# Patient Record
Sex: Male | Born: 1955
Health system: Southern US, Community
[De-identification: ages and names within clinical notes are randomized; demographics above are authoritative.]

## PROBLEM LIST (undated history)

## (undated) DIAGNOSIS — E349 Endocrine disorder, unspecified: Secondary | ICD-10-CM

## (undated) DIAGNOSIS — I10 Essential (primary) hypertension: Secondary | ICD-10-CM

## (undated) DIAGNOSIS — R079 Chest pain, unspecified: Secondary | ICD-10-CM

## (undated) DIAGNOSIS — I2510 Atherosclerotic heart disease of native coronary artery without angina pectoris: Secondary | ICD-10-CM

## (undated) DIAGNOSIS — C220 Liver cell carcinoma: Secondary | ICD-10-CM

## (undated) DIAGNOSIS — E785 Hyperlipidemia, unspecified: Secondary | ICD-10-CM

## (undated) DIAGNOSIS — D490 Neoplasm of unspecified behavior of digestive system: Secondary | ICD-10-CM

## (undated) HISTORY — DX: Endocrine disorder, unspecified: E34.9

## (undated) HISTORY — DX: Essential (primary) hypertension: I10

## (undated) HISTORY — DX: Hyperlipidemia, unspecified: E78.5

## (undated) HISTORY — PX: NECK SURGERY: SHX720

---

## 1998-01-17 ENCOUNTER — Encounter: Payer: Self-pay | Admitting: Emergency Medicine

## 1998-01-17 ENCOUNTER — Encounter: Payer: Self-pay | Admitting: Internal Medicine

## 1998-01-17 ENCOUNTER — Inpatient Hospital Stay (HOSPITAL_COMMUNITY): Admission: EM | Admit: 1998-01-17 | Discharge: 1998-01-23 | Payer: Self-pay | Admitting: Emergency Medicine

## 1998-01-18 ENCOUNTER — Encounter: Payer: Self-pay | Admitting: Internal Medicine

## 1998-01-19 ENCOUNTER — Encounter: Payer: Self-pay | Admitting: Emergency Medicine

## 1998-01-20 ENCOUNTER — Encounter: Payer: Self-pay | Admitting: Gastroenterology

## 1998-02-12 ENCOUNTER — Encounter: Payer: Self-pay | Admitting: Internal Medicine

## 1998-02-12 ENCOUNTER — Ambulatory Visit (HOSPITAL_COMMUNITY): Admission: RE | Admit: 1998-02-12 | Discharge: 1998-02-12 | Payer: Self-pay | Admitting: Internal Medicine

## 2000-08-16 ENCOUNTER — Ambulatory Visit (HOSPITAL_COMMUNITY): Admission: RE | Admit: 2000-08-16 | Discharge: 2000-08-16 | Payer: Self-pay | Admitting: Family Medicine

## 2000-08-16 ENCOUNTER — Encounter: Payer: Self-pay | Admitting: Family Medicine

## 2000-08-23 ENCOUNTER — Encounter: Payer: Self-pay | Admitting: Family Medicine

## 2000-08-23 ENCOUNTER — Encounter: Admission: RE | Admit: 2000-08-23 | Discharge: 2000-08-23 | Payer: Self-pay | Admitting: Family Medicine

## 2000-10-14 ENCOUNTER — Encounter: Payer: Self-pay | Admitting: Neurosurgery

## 2000-10-18 ENCOUNTER — Ambulatory Visit (HOSPITAL_COMMUNITY): Admission: RE | Admit: 2000-10-18 | Discharge: 2000-10-19 | Payer: Self-pay | Admitting: Neurosurgery

## 2000-10-18 ENCOUNTER — Encounter: Payer: Self-pay | Admitting: Neurosurgery

## 2000-11-19 ENCOUNTER — Ambulatory Visit (HOSPITAL_COMMUNITY): Admission: RE | Admit: 2000-11-19 | Discharge: 2000-11-19 | Payer: Self-pay | Admitting: Neurosurgery

## 2000-11-19 ENCOUNTER — Encounter: Payer: Self-pay | Admitting: Neurosurgery

## 2001-01-10 ENCOUNTER — Ambulatory Visit (HOSPITAL_COMMUNITY): Admission: RE | Admit: 2001-01-10 | Discharge: 2001-01-10 | Payer: Self-pay | Admitting: Neurosurgery

## 2001-01-10 ENCOUNTER — Encounter: Payer: Self-pay | Admitting: Neurosurgery

## 2003-05-03 ENCOUNTER — Encounter: Admission: RE | Admit: 2003-05-03 | Discharge: 2003-05-03 | Payer: Self-pay | Admitting: Neurosurgery

## 2003-05-15 ENCOUNTER — Encounter: Admission: RE | Admit: 2003-05-15 | Discharge: 2003-05-15 | Payer: Self-pay | Admitting: Neurosurgery

## 2003-08-03 ENCOUNTER — Other Ambulatory Visit: Admission: RE | Admit: 2003-08-03 | Discharge: 2003-08-03 | Payer: Self-pay | Admitting: Otolaryngology

## 2003-09-18 ENCOUNTER — Ambulatory Visit (HOSPITAL_COMMUNITY): Admission: RE | Admit: 2003-09-18 | Discharge: 2003-09-18 | Payer: Self-pay | Admitting: Family Medicine

## 2004-01-18 ENCOUNTER — Emergency Department (HOSPITAL_COMMUNITY): Admission: EM | Admit: 2004-01-18 | Discharge: 2004-01-18 | Payer: Self-pay | Admitting: Emergency Medicine

## 2004-02-01 ENCOUNTER — Encounter: Admission: RE | Admit: 2004-02-01 | Discharge: 2004-02-01 | Payer: Self-pay | Admitting: Neurosurgery

## 2004-02-19 ENCOUNTER — Encounter (HOSPITAL_COMMUNITY): Admission: RE | Admit: 2004-02-19 | Discharge: 2004-03-20 | Payer: Self-pay | Admitting: Neurosurgery

## 2004-07-31 ENCOUNTER — Encounter: Admission: RE | Admit: 2004-07-31 | Discharge: 2004-07-31 | Payer: Self-pay | Admitting: Orthopedic Surgery

## 2005-04-24 ENCOUNTER — Ambulatory Visit (HOSPITAL_COMMUNITY): Admission: RE | Admit: 2005-04-24 | Discharge: 2005-04-24 | Payer: Self-pay | Admitting: Neurosurgery

## 2005-06-01 ENCOUNTER — Ambulatory Visit (HOSPITAL_COMMUNITY): Admission: RE | Admit: 2005-06-01 | Discharge: 2005-06-01 | Payer: Self-pay | Admitting: Neurosurgery

## 2006-04-14 ENCOUNTER — Ambulatory Visit: Payer: Self-pay | Admitting: Cardiovascular Disease

## 2006-05-04 ENCOUNTER — Ambulatory Visit: Payer: Self-pay | Admitting: Cardiovascular Disease

## 2007-02-03 DIAGNOSIS — E349 Endocrine disorder, unspecified: Secondary | ICD-10-CM

## 2007-02-03 HISTORY — DX: Endocrine disorder, unspecified: E34.9

## 2008-08-03 ENCOUNTER — Ambulatory Visit (HOSPITAL_COMMUNITY): Admission: RE | Admit: 2008-08-03 | Discharge: 2008-08-03 | Payer: Self-pay | Admitting: Family Medicine

## 2010-06-20 NOTE — Assessment & Plan Note (Signed)
[facility]                       [place] CARDIOLOGY OFFICE NOTE   JULIOUS, LANGLOIS                       MRN:          [id number]  DATE:04/14/2006                            DOB:          04-Sep-1955    [name] is a pleasant 55 year old patient referred for chest pain.  Patient is a previous alcoholic who has been dry for 15 years.  He is a  nonsmoker.  There is a family history for coronary disease.  He has been  ill over the last month.  He has been having increasing night sweats.  He has been having atypical chest pain.  The pain can wake him up from  his sleep.  It is not always exertional.  It is sharp.  It is on the  left side of his chest.   Initially, there was a question of GERD.  He was placed on AcipHex  b.i.d. with improvement.  He was also told to avoid antiinflammatories.   There was a question of whether these nighttime spells were panic  attacks.   The patient has had less chest pain over the last week.  He has not had  any prolonged episodes.   REVIEW OF SYSTEMS:  Remarkable for no significant syncope.  No history  of PE or DVT.  He has apparently had a history of sepsis a few years ago  and had an EGD and colonoscopy.  He tells me at the time his gallbladder  was fine, and no one ever quite figured out how he got septicemic.  Unfortunately, I do not have these records.   His only medication includes Zantac and a baby aspirin a day.   HE HAS NO KNOWN ALLERGIES.   He quit drinking 15 years ago.  He does not smoke, but his wife does.  He works for an [employer] in [place].  He has 3 teenage  kids, and is quite busy taking them to softball and other sporting  events.   He is fairly sedentary.  His kids are trying to get him to join the Y.   PAST MEDICAL HISTORY:  Otherwise remarkable for cervical spine surgery  by Dr. Donalee Citrin, bilateral carpal tunnel release by Dr. Wynetta Emery.   His weight is 247.  His blood  pressure is 130/80.  Pulse 76 and regular.  HEENT:  Is normal.  Carotids normal without bruits.  Status post C spine surgery.  LUNGS:  Clear.  He has an S1 and S2 with normal heart sounds.  ABDOMEN:  Benign.  There is no right upper quadrant pain.  No epigastric  pain.  Distal pulses are intact with no edema.  EKG is entirely normal.   IMPRESSION:  Patient pain is atypical for heart pain, however, he does  have a family history of coronary disease.  He has a normal EKG.  I will  have him come back as soon as possible for routine treadmill test.  As  long as this is normal, he will not need further cardiac workup.  He has  a normal EKG and no murmur.  I do not  think an echo is in order.   If he has recurrent bouts of problems, it would probably be reasonable  to do a right upper quadrant ultrasound and/or an abdominal CT.  He has  a history of occult sepsis that may have been related to  microperfusions, and some of his pain is associated with nausea.   He is also a previous alcoholic, and may need followup EGD.   I will see him back when he has his treadmill test.  His risk factors  otherwise appear normal.  He will continue his H2 blockers.   Further recommendations will be based on the results of stress test.     Theron Arista C. Eden Emms, MD, [facility]  Electronically Signed    PCN/MedQ  DD: 04/14/2006  DT: 04/16/2006  Job #: [id number]   cc:   Lorin Picket A. Gerda Diss, MD

## 2010-06-20 NOTE — Op Note (Signed)
NAMETHUAN, TIPPETT                ACCOUNT NO.:  [id number]   MEDICAL RECORD NO.:  [id number]          PATIENT TYPE:  AMB   LOCATION:  SDS                          FACILITY:  [facility]   PHYSICIAN:  Donalee Citrin, M.D.        DATE OF BIRTH:  Jul 02, 1955   DATE OF PROCEDURE:  06/01/2005  DATE OF DISCHARGE:                                 OPERATIVE REPORT   PREOPERATIVE DIAGNOSIS:  Left carpal tunnel syndrome.   POSTOPERATIVE DIAGNOSIS:  Left carpal tunnel syndrome.   OPERATION PERFORMED:  Left carpal tunnel release.   SURGEON:  Donalee Citrin, M.D.   ANESTHESIA:  Bier block.   INDICATIONS FOR PROCEDURE:  The patient is a very pleasant 55 year old  gentleman who has had bilateral carpal tunnel syndrome.  Had the right one  repaired about six weeks ago, did very well.  EMG documentation of bilateral  severe carpal tunnel syndrome.  Risks and benefits of a left carpal tunnel  release were subsequently explained to the patient and he wanted to proceed  forward and we subsequently set this up.   DESCRIPTION OF PROCEDURE:  The patient was brought to the operating room  where he was induced under Bier block anesthesia, left arm prepped and  draped in the usual sterile fashion.  After adequate anesthesia was  confirmed, an incision was made approximately 3 to 4 cm long from the distal  crease of the wrist along the palmar crease and aligned with the proximal  aspect of the second digit.  Then self-retaining retractor was placed.  Using a combination of sharp dissection and hemostats, the transverse carpal  ligament was identified and divided, was noted to be markedly thickened,  then using a pair of mosquitoes, the undersurface of the transverse carpal  ligament was developed from the __________ median nerve and the epineurial  surface was dissected free and the transverse carpal ligament was divided  both in cephalocaudal direction until the medial nerve was completely  decompressed with no further  stenosis appreciated along the ligament by  easily passing a mosquito hemostat both proximally and distally on the  surface of the nerve root.  The wound was then copiously irrigated.  Meticulous hemostasis was maintained with some adjunctive bipolar  electrocautery.  Then the skin was reapproximated with vertical interrupted  mattress suture.  Bacitracin, Adaptic, Kerlix fluffs, Kerlix roll and then  an Ace wrap were then applied and the tourniquet was released.  Total  tourniquet time was approximately 30 minutes.  The patient was then  transferred to the recovery room in stable condition. At the end of the case  all needle counts, sharp and sponge counts correct.           ______________________________  Donalee Citrin, M.D.     GC/MEDQ  D:  06/01/2005  T:  06/02/2005  Job:  [id number]

## 2010-06-20 NOTE — Procedures (Signed)
[facility]                              EXERCISE TREADMILL   NAME:Logan Alvarez, Logan Alvarez                       MRN:          [id number]  DATE:05/04/2006                            DOB:          1955-12-03    INDICATIONS:  Chest pain, coronary risk factors.   The patient exercised the equivalent of 30 seconds in the stage 4 of the  Bruce protocol. Exercise was stopped due to fatigue and shortness of  breath. Maximum heart rate was 184, peak blood pressure was 208/88.  Resting EKG was normal with stressors, a millimeter of upsloping ST  segment changes in the inferolateral leads. This was not thought to be  significant.   IMPRESSION:  Negative exercise stress test showed a good work load.   The patient should be followed for incipient hypertension given his high  blood pressure response to exercise. Resting blood pressure was normal  at 130/80.     Noralyn Pick. Eden Emms, MD, [facility]  Electronically Signed    PCN/MedQ  DD: 05/04/2006  DT: 05/04/2006  Job #: [id number]   cc:   Lorin Picket A. Gerda Diss, MD
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
## 2010-06-20 NOTE — Op Note (Signed)
[facility]  Patient:    Logan Alvarez, Logan Alvarez Visit Number: [id number] MRN: [id number]          Service Type: DSU Location: 3000 [id number] Attending Physician:  Mariam Dollar Dictated by:   Garlon Hatchet., M.D. Proc. Date: 10/18/00 Admit Date:  10/18/2000                             Operative Report  PREOPERATIVE DIAGNOSIS:  C6-7 radiculopathy from cervical spondylosis.  C5-6.  PROCEDURE:  Anterior cervical diskectomy and fusion at C5-6 and C6-7 using a 6 mm patellar wedge at C5-6 and a 7 mm patellar wedge at C6-7, a 42.5 mm Atlantis vision plate with five 13 mm varied angle screws and one 13 mm rescue screw.  SURGEON:  Garlon Hatchet., M.D.  ASSISTANT:  Altamease Oiler, M.D.  ANESTHESIA:  General endotracheal.  INDICATIONS FOR PROCEDURE:  The patient is a very pleasant 55 year old gentleman who has had long-standing neck and left arm pain radiating down his first two fingers, as well as his middle finger.  He has had weakness of his biceps and triceps that has been refractory to conservative treatment.  The patients preoperative imaging revealed severe cervical spondylosis and spinal cord compression at C5-6 and C6-7 with compression at both C6 and C7 nerve roots on the left.  The patient was counseled on the risks and benefits of surgery and consented.  DESCRIPTION OF PROCEDURE:  The patient was brought to the OR, induced under general anesthesia, positioned supine with the head in full extension, shoulder roll.  The right side of his neck was prepped and draped in usual sterile fashion.  The Halter traction at 5 pounds was also applied. Preoperative x-ray localized the needle at the C5 vertebral body.  A curvilinear incision was made just inferior to this, just off the midline to the sternocleidomastoid.  Superficially, the platysma was dissected out and divided longitudinally.  The area of ______ ______ was developed on the previously old fascia  which was dissected away with Kitnas.  The longus ______ was then identified and reflected laterally.  Intraoperative x-ray confirmed localization of the C4-5 interspace.  Attention was taken to the two disk spaces inferior to this.  Both interspaces were incised with an 11 blade scalpel, and pituitary rongeurs were used to move the anterior margin of the ______.  The remainder of the longus was deflated laterally and a self-retaining retractor was placed.  Then attention was turned to the 5-6 disk space.  Using a BA upgoing curette, the remainder of the anterior margin of the annulus was removed.  Kerrison punches 3 mm removed the anterior osteophyte.  A high speed drill was used to drill out the posterior annulus, and the ligamentous was identified and removed in a piecemeal fashion.  Then the thecal sac was visualized.  The remainder of the ligament was removed. Thecal sac was decompressed, and there was noted to be a large spur and disk material compressing the left side of the spinal cord and left C6 nerve root, and this was also radically decompressed out of the foramen and explored, and confirmed to have no further compression.  Attention was also taken of the right C6 nerve root.  This was also explored across the foramen, and noted to have no further compressive mass on it.  Gelfoam was overlaid.  Attention was taken to the C6-7 disk space.  Again, a  high speed drill was used to drill down the anterior margin of the annulus down to the posterior osteophyte and posterior ligament.  There was a large osteophyte coming off the C6 vertebral body, compressing the proximal aspect of the left C7 nerve root.  This was underbitten with a 1 to 2 mm Kerrison punch, and thecal sac and posterior longitudinal ligament were removed in a piecemeal fashion, and the C7 nerve root and levels were adequately decompressed.  The thecal sac was then decompressed out to the left side, and the right side,  and the proximal aspect of the right C7 nerve root was also decompressed.  After both end plates were prepared for arthrodesis, a 6 and 7 mm patellar wedge was sized and selected, and inserted in the C5-6 and C6-7, respectively.  After 2 mm of depth was removed from the 16 mm wedge creating a 6 x 15 x 12 bone wedge.  Then a 42 mm Atlantis plate was sized and selected and inserted after being drilled and tapped, and as I said, five 13 mm variable angle screws and one rescue screw were inserted because the inferior screw on the patients left side at C7 was noted to be striped, but all screws received good purchase, including the rescue screw on replacement.  The wound was copiously irrigated.  Hemostasis was obtained.  Postoperative x-ray confirmed good location of the plate screws and bone grafts.  The platysmus was closed with 3-0 interrupted Vicryl.  The skin was closed with running 4-0 subcuticular.  Benzoin and Steri-Strips were applied.  The patient went to recovery room in stable condition. Dictated by:   Garlon Hatchet., M.D. Attending Physician:  Mariam Dollar DD:  10/18/00 TD:  10/18/00 Job: [id number]

## 2011-04-14 ENCOUNTER — Ambulatory Visit (HOSPITAL_COMMUNITY)
Admission: RE | Admit: 2011-04-14 | Discharge: 2011-04-14 | Disposition: A | Discharge status: Home / Self Care | Payer: BC Managed Care – PPO | Source: Ambulatory Visit | Attending: Family Medicine | Admitting: Family Medicine

## 2011-04-14 ENCOUNTER — Encounter: Payer: Self-pay | Admitting: Cardiovascular Disease

## 2011-04-14 ENCOUNTER — Other Ambulatory Visit: Payer: Self-pay | Admitting: Family Medicine

## 2011-04-14 DIAGNOSIS — R079 Chest pain, unspecified: Secondary | ICD-10-CM | POA: Insufficient documentation

## 2011-04-15 ENCOUNTER — Ambulatory Visit (INDEPENDENT_AMBULATORY_CARE_PROVIDER_SITE_OTHER): Payer: BC Managed Care – PPO | Admitting: Cardiovascular Disease

## 2011-04-15 ENCOUNTER — Encounter: Payer: Self-pay | Admitting: Cardiovascular Disease

## 2011-04-15 DIAGNOSIS — F419 Anxiety disorder, unspecified: Secondary | ICD-10-CM

## 2011-04-15 DIAGNOSIS — F411 Generalized anxiety disorder: Secondary | ICD-10-CM

## 2011-04-15 DIAGNOSIS — I10 Essential (primary) hypertension: Secondary | ICD-10-CM

## 2011-04-15 DIAGNOSIS — R079 Chest pain, unspecified: Secondary | ICD-10-CM | POA: Insufficient documentation

## 2011-04-15 DIAGNOSIS — R0789 Other chest pain: Secondary | ICD-10-CM | POA: Insufficient documentation

## 2011-04-15 DIAGNOSIS — E782 Mixed hyperlipidemia: Secondary | ICD-10-CM

## 2011-04-15 NOTE — Patient Instructions (Signed)
**Note De-identified Logan Alvarez Obfuscation** Your physician has requested that you have a stress echocardiogram. For further information please visit [email or web address]. Please follow instruction sheet as given.  Your physician recommends that you continue on your current medications as directed. Please refer to the Current Medication list given to you today.  Your physician recommends that you schedule a follow-up appointment in: we will contact you with results of test  

## 2011-04-15 NOTE — Assessment & Plan Note (Signed)
Well controlled.  Continue current medications and low sodium Dash type diet.    

## 2011-04-15 NOTE — Progress Notes (Signed)
Referred by Dr Gerda Diss for SSCP.  Last seen in 2008 for atypical pain.  Had normal ETT at that time.  Recurrent symptoms 04/14/11.  Intermittent, sharp pains can radiate to both arms.  More stress lately and ? History of panic attacks. Positive family history of CAD Father MI in his 73's.  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  Thinks some of his pain is muscular.    Wife died last 10/27/2022.  I think he had issues with anxiety before and is not coping with wifes death well.  She had problems with chronic pain and opiate addiction.  ROS: Denies fever, malais, weight loss, blurry vision, decreased visual acuity, cough, sputum, SOB, hemoptysis, pleuritic pain, palpitaitons, heartburn, abdominal pain, melena, lower extremity edema, claudication, or rash.  All other systems reviewed and negative   General: Affect appropriate Healthy:  appears stated age HEENT: normal Neck supple with no adenopathy JVP normal no bruits no thyromegaly Lungs clear with no wheezing and good diaphragmatic motion Heart:  S1/S2 no murmur,rub, gallop or click PMI normal Abdomen: benighn, BS positve, no tenderness, no AAA no bruit.  No HSM or HJR Distal pulses intact with no bruits No edema Neuro non-focal Skin warm and dry No muscular weakness  Medications Current Outpatient Prescriptions  Medication Sig Dispense Refill  . ALPRAZolam (XANAX) 0.5 MG tablet       . amLODipine (NORVASC) 5 MG tablet Take 5 mg by mouth daily.      . ANDROGEL PUMP 1.25 GM/ACT (1%) GEL       . aspirin 81 MG tablet Take 81 mg by mouth daily.      Marland Kitchen gemfibrozil (LOPID) 600 MG tablet       . HYDROcodone-acetaminophen (NORCO) 7.5-325 MG per tablet         Allergies Review of patient's allergies indicates not on file.  Family History: Family History  Problem Relation Age of Onset  . Heart attack Father     Social History: History   Social History  . Marital Status: Married    Spouse Name: N/A    Number  of Children: N/A  . Years of Education: N/A   Occupational History  . Not on file.   Social History Main Topics  . Smoking status: Never Smoker   . Smokeless tobacco: Never Used  . Alcohol Use: No  . Drug Use: No  . Sexually Active: Not on file   Other Topics Concern  . Not on file   Social History Narrative  . No narrative on file    Electrocardiogram:  NSR rate 59 ECG done at [facility] office 3/12  Assessment and Plan

## 2011-04-15 NOTE — Assessment & Plan Note (Signed)
Cholesterol is at goal.  Continue current dose of statin and diet Rx.  No myalgias or side effects.  F/U  LFT's in 6 months. No results found for this basename: [facility]   Reviewed labs from Dr Gerda Diss.  LDL 104 HDL 39 TC 161 TG 91

## 2011-04-15 NOTE — Assessment & Plan Note (Signed)
F/U Luk[place]  PRN aprazolam  Consider further Rx

## 2011-04-15 NOTE — Assessment & Plan Note (Signed)
Atypical Family history  Normal ECG  F/U stress echo

## 2011-04-24 ENCOUNTER — Ambulatory Visit (HOSPITAL_COMMUNITY)
Admission: RE | Admit: 2011-04-24 | Discharge: 2011-04-24 | Disposition: A | Discharge status: Home / Self Care | Payer: BC Managed Care – PPO | Source: Ambulatory Visit | Attending: Cardiovascular Disease | Admitting: Cardiovascular Disease

## 2011-04-24 DIAGNOSIS — R072 Precordial pain: Secondary | ICD-10-CM

## 2011-04-24 DIAGNOSIS — R079 Chest pain, unspecified: Secondary | ICD-10-CM | POA: Insufficient documentation

## 2011-04-24 NOTE — Progress Notes (Signed)
*  PRELIMINARY RESULTS* Echocardiogram Echocardiogram Stress Test has been performed.  Conrad [place] 04/24/2011, 10:17 AM

## 2011-04-24 NOTE — Progress Notes (Signed)
Stress Lab Nurses Notes - [facility]  Logan Alvarez 04/24/2011  Reason for doing test: Chest Pain  Type of test: Stress Echo  Nurse performing test: Marlena Clipper, RN  Nuclear Medicine Tech: Not Applicable  Echo Tech: Karrie Doffing  MD performing test: Vaughan Browner  Family MD:   Test explained and consent signed: yes  IV started: No IV started  Symptoms: None  Treatment/Intervention: None  Reason test stopped: reached target HR  After recovery IV was: no IV started  Patient to return to Nuc. Med at :N/A  Patient discharged: Home  Patient's Condition upon discharge was: stable  Comments: The patients Peak BP was 164/107 and peak HR was  176 .  His Recovery Bp was 128/60 and his recovery HR was  103. Symptoms resolved in recovery. [name]

## 2011-05-05 ENCOUNTER — Encounter: Payer: Self-pay | Admitting: *Deleted

## 2011-09-28 ENCOUNTER — Encounter (HOSPITAL_COMMUNITY): Payer: Self-pay | Admitting: Cardiovascular Disease

## 2012-05-16 ENCOUNTER — Telehealth: Payer: Self-pay | Admitting: Family Medicine

## 2012-05-16 DIAGNOSIS — E785 Hyperlipidemia, unspecified: Secondary | ICD-10-CM

## 2012-05-16 DIAGNOSIS — R7301 Impaired fasting glucose: Secondary | ICD-10-CM

## 2012-05-16 DIAGNOSIS — Z79899 Other long term (current) drug therapy: Secondary | ICD-10-CM

## 2012-05-16 NOTE — Telephone Encounter (Signed)
Lipid profile, liver profile, hemoglobin A 1C. Reason: Hyperlipidemia, hyperglycemia

## 2012-05-16 NOTE — Telephone Encounter (Signed)
Pt notified on voicemail that his bw papers are ready for pick up

## 2012-05-16 NOTE — Telephone Encounter (Signed)
Blood work papers for an appt on 05/27/12

## 2012-05-17 ENCOUNTER — Telehealth: Payer: Self-pay | Admitting: Family Medicine

## 2012-05-17 NOTE — Telephone Encounter (Signed)
Patient needs a refill of his alprazolam sent to [facility].

## 2012-05-17 NOTE — Telephone Encounter (Signed)
Pharm stated they didn't receive request we sent on 05-11-12 last one was from sept with 4 refills and 1 refill canceled bc it expired. Alprazolam 0.5 mg #120 called into [facility] no refills needs office visit per dr Lorin Picket. Pt states he has app next week

## 2012-05-17 NOTE — Telephone Encounter (Signed)
According to the paper chart. A refill was sent 05/11/2012. Please clarify with [facility]. He may have one refill. He needs a followup visit this spring.

## 2012-05-20 ENCOUNTER — Encounter: Payer: Self-pay | Admitting: *Deleted

## 2012-05-25 LAB — HEPATIC FUNCTION PANEL
ALT: 46 U/L (ref 0–53)
AST: 39 U/L — ABNORMAL HIGH (ref 0–37)
Albumin: 4.4 g/dL (ref 3.5–5.2)
Bilirubin, Direct: 0.3 mg/dL (ref 0.0–0.3)
Total Bilirubin: 1.5 mg/dL — ABNORMAL HIGH (ref 0.3–1.2)
Total Protein: 7.4 g/dL (ref 6.0–8.3)

## 2012-05-25 LAB — LIPID PANEL
Cholesterol: 175 mg/dL (ref 0–200)
HDL: 38 mg/dL — ABNORMAL LOW (ref >39)
LDL Cholesterol: 118 mg/dL — ABNORMAL HIGH (ref 0–99)
Total CHOL/HDL Ratio: 4.6 Ratio
Triglycerides: 97 mg/dL (ref <150)
VLDL: 19 mg/dL (ref 0–40)

## 2012-05-25 LAB — HEMOGLOBIN A1C
Hgb A1c MFr Bld: 5.1 % (ref <5.7)
Mean Plasma Glucose: 100 mg/dL (ref <117)

## 2012-05-27 ENCOUNTER — Encounter: Payer: Self-pay | Admitting: Family Medicine

## 2012-05-27 ENCOUNTER — Ambulatory Visit (INDEPENDENT_AMBULATORY_CARE_PROVIDER_SITE_OTHER): Payer: BC Managed Care – PPO | Admitting: Family Medicine

## 2012-05-27 VITALS — BP 122/84 | HR 70 | Wt 238.8 lb

## 2012-05-27 DIAGNOSIS — E291 Testicular hypofunction: Secondary | ICD-10-CM

## 2012-05-27 DIAGNOSIS — F419 Anxiety disorder, unspecified: Secondary | ICD-10-CM

## 2012-05-27 DIAGNOSIS — E349 Endocrine disorder, unspecified: Secondary | ICD-10-CM | POA: Insufficient documentation

## 2012-05-27 DIAGNOSIS — F411 Generalized anxiety disorder: Secondary | ICD-10-CM

## 2012-05-27 DIAGNOSIS — I10 Essential (primary) hypertension: Secondary | ICD-10-CM

## 2012-05-27 DIAGNOSIS — E782 Mixed hyperlipidemia: Secondary | ICD-10-CM

## 2012-05-27 MED ORDER — HYDROCODONE-ACETAMINOPHEN 7.5-325 MG PO TABS: 1.0000 | ORAL_TABLET | Freq: Four times a day (QID) | ORAL | Status: DC | PRN

## 2012-05-27 MED ORDER — ALPRAZOLAM 0.5 MG PO TABS: 0.5000 mg | ORAL_TABLET | Freq: Four times a day (QID) | ORAL | Status: DC | PRN

## 2012-05-27 NOTE — Progress Notes (Signed)
  Subjective:    Patient ID: Logan Alvarez, male    DOB: [birth year], 57 y.o.   MRN: [id number]  HPI#1-patient has chronic low back pain and discomfort intermittently radiates into the legs not severe uses hydrocodone anywhere between one and 4 tablets a day depending on work. He denies it causing drowsiness nausea or vomiting. #2-testosterone deficiency-he is aware that this can increase the risk of heart disease by using this supplement but he states by using this supplement it has greatly helped his focus energy and sense of well-being. He states when he stopped it for several weeks he had of a lot of problems with stress and anxiety #3 anxiety-this is been a chronic issue he uses Xanax up to 4 times per day he denies abusing it #4-hypertension-he takes his medicine up until recently he states he didn't think he needed the medicine anymore he's been trying eat healthier stay more active he denies any chest tightness pressure pain shortness of breath. Hypertriglycerides-he does take his medicines and he had recent lab work which overall looked pretty good  Family history social history past medical history reviewed.    Review of Systemssee above.     Objective:   Physical Exam Neck no masses lungs are clear no crackles heart is regular pulse normal blood pressure is good abdomen soft mildly obese extremities no edema skin warm dry       Assessment & Plan:  HTN (hypertension)  Anxiety  Mixed hyperlipidemia  Testosterone deficiency  His blood pressure overall is doing well without medication he will continue exercise watch diet try to bring his weight down. He will use Xanax up to 4 times a day and avoid overusing it. Chronic pain hydrocodone prescription was given with refills he is to followup in 4 months Elevated triglycerides-continue gemfibrozil twice a day and watch diet closely Testosterone deficiency he is aware that this increases her risk of heart disease by the time he is  57 years old he should be considering staying away from it. For now he will continue to use it we will check lab work on that on the next visit in several months.

## 2012-05-27 NOTE — Patient Instructions (Signed)
Physical in August 2014

## 2012-07-05 ENCOUNTER — Other Ambulatory Visit: Payer: Self-pay | Admitting: Family Medicine

## 2012-10-22 ENCOUNTER — Other Ambulatory Visit: Payer: Self-pay | Admitting: Family Medicine

## 2012-11-14 ENCOUNTER — Other Ambulatory Visit: Payer: Self-pay | Admitting: Family Medicine

## 2012-12-09 ENCOUNTER — Ambulatory Visit: Payer: BC Managed Care – PPO | Admitting: Adult Health

## 2012-12-19 ENCOUNTER — Ambulatory Visit (INDEPENDENT_AMBULATORY_CARE_PROVIDER_SITE_OTHER): Payer: BC Managed Care – PPO | Admitting: Cardiovascular Disease

## 2012-12-19 ENCOUNTER — Encounter: Payer: Self-pay | Admitting: Cardiovascular Disease

## 2012-12-19 VITALS — BP 132/70 | HR 63 | Ht 69.0 in | Wt 235.0 lb

## 2012-12-19 DIAGNOSIS — R079 Chest pain, unspecified: Secondary | ICD-10-CM

## 2012-12-19 DIAGNOSIS — I10 Essential (primary) hypertension: Secondary | ICD-10-CM

## 2012-12-19 DIAGNOSIS — E782 Mixed hyperlipidemia: Secondary | ICD-10-CM

## 2012-12-19 NOTE — Progress Notes (Signed)
Patient ID: Logan Alvarez, male   DOB: [birth year], 57 y.o.   MRN: [id number] Referred by Dr Gerda Diss for SSCP. Last seen in 2008 for atypical pain. Had normal ETT at that time. Recurrent symptoms 04/14/11. Intermittent, sharp pains can radiate to both arms. More stress lately and ? History of panic attacks. Positive family history of CAD Father MI in his 24's. 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 Thinks some of his pain is muscular.  Wife died last Sep 30, 2011  I think he had issues with anxiety before and is not coping with wifes death well. She had problems with chronic pain and opiate addiction.    He continues to have spasms of chest pain Some exertional Not related to food. Anxiety is better. Pain not positional or pleuritic.  Can last minutes. No associated Diaphoresis or dyspnea  04/25/11 stress echo was low risk but not diagnostic due to poor images   ROS: Denies fever, malais, weight loss, blurry vision, decreased visual acuity, cough, sputum, SOB, hemoptysis, pleuritic pain, palpitaitons, heartburn, abdominal pain, melena, lower extremity edema, claudication, or rash.  All other systems reviewed and negative  General: Affect appropriate Healthy:  appears stated age HEENT: normal Neck supple with no adenopathy JVP normal no bruits no thyromegaly Lungs clear with no wheezing and good diaphragmatic motion Heart:  S1/S2 no murmur, no rub, gallop or click PMI normal Abdomen: benighn, BS positve, no tenderness, no AAA no bruit.  No HSM or HJR Distal pulses intact with no bruits No edema Neuro non-focal Skin warm and dry No muscular weakness   Current Outpatient Prescriptions  Medication Sig Dispense Refill  . ALPRAZolam (XANAX) 0.5 MG tablet Take 1 tablet (0.5 mg total) by mouth 4 (four) times daily as needed for sleep. 1/2 to 1 tab QID  120 tablet  4  . ANDROGEL PUMP 1.25 GM/ACT (1%) GEL 2 pumps daily      . ANDROGEL PUMP 20.25 MG/ACT (1.62%) GEL USE 2  PUMPS DAILY AS DIRECTED.  75 g  3  . aspirin 81 MG tablet Take 81 mg by mouth daily.      Marland Kitchen gemfibrozil (LOPID) 600 MG tablet TAKE 1 TABLET TWICE DAILY  180 tablet  0  . HYDROcodone-acetaminophen (NORCO) 7.5-325 MG per tablet Take 1 tablet by mouth every 6 (six) hours as needed.  60 tablet  4   No current facility-administered medications for this visit.    Allergies  Lodine  Electrocardiogram:  NSR rate 58 normal ECG   Assessment and Plan

## 2012-12-19 NOTE — Assessment & Plan Note (Signed)
Improved Grief seems to be resolving Children ( 2 boys and daughter doing well with one son living at home now)

## 2012-12-19 NOTE — Assessment & Plan Note (Signed)
Continued issues with pain and CRF;s HTN and family history.  Abnormal / non diagnostic stress echo.  Favor cardiac CT to definitively r/o CAD

## 2012-12-19 NOTE — Patient Instructions (Addendum)
Your physician recommends that you schedule a follow-up appointment in: 6 MONTHS WITH Dr. Eden Emms IN THE [facility] OFFICE  Your physician has requested that you have cardiac CT. Cardiac computed tomography (CT) is a painless test that uses an x-ray machine to take clear, detailed pictures of your heart. For further information please visit [email or web address] Please follow instruction sheet as given.   WE WILL CALL YOU WITH YOUR TEST RESULTS/INSTRUCTIONS/NEXT STEPS ONCE RECEIVED BY THE PROVIDER

## 2012-12-19 NOTE — Assessment & Plan Note (Signed)
Cholesterol is at goal.  Continue current dose of statin and diet Rx.  No myalgias or side effects.  F/U  LFT's in 6 months. Lab Results  Component Value Date   LDLCALC 118* 05/25/2012

## 2012-12-19 NOTE — Assessment & Plan Note (Signed)
Well controlled.  Continue current medications and low sodium Dash type diet.    

## 2012-12-21 ENCOUNTER — Telehealth: Payer: Self-pay | Admitting: *Deleted

## 2012-12-21 ENCOUNTER — Encounter: Payer: Self-pay | Admitting: *Deleted

## 2012-12-21 NOTE — Telephone Encounter (Signed)
Left message for patient to call to set up an appointment

## 2012-12-21 NOTE — Telephone Encounter (Signed)
Cardiac CT 01/06/13 @ 2 pm

## 2013-01-05 ENCOUNTER — Other Ambulatory Visit: Payer: Self-pay | Admitting: Family Medicine

## 2013-01-05 NOTE — Telephone Encounter (Signed)
1 refill, he needs to schedule an appointment no further meds after this

## 2013-01-06 ENCOUNTER — Ambulatory Visit (HOSPITAL_COMMUNITY)
Admission: RE | Admit: 2013-01-06 | Discharge: 2013-01-06 | Disposition: A | Discharge status: Home / Self Care | Payer: BC Managed Care – PPO | Source: Ambulatory Visit | Attending: Cardiovascular Disease | Admitting: Cardiovascular Disease

## 2013-01-06 DIAGNOSIS — I10 Essential (primary) hypertension: Secondary | ICD-10-CM

## 2013-01-06 DIAGNOSIS — E782 Mixed hyperlipidemia: Secondary | ICD-10-CM

## 2013-01-06 DIAGNOSIS — R079 Chest pain, unspecified: Secondary | ICD-10-CM | POA: Insufficient documentation

## 2013-01-06 MED ORDER — NITROGLYCERIN 0.4 MG SL SUBL
0.4000 mg | SUBLINGUAL_TABLET | SUBLINGUAL | Status: DC | PRN
Start: 2013-01-06 — End: 2013-01-07
  Filled 2013-01-06: qty 25

## 2013-01-06 MED ORDER — NITROGLYCERIN 0.4 MG SL SUBL
SUBLINGUAL_TABLET | SUBLINGUAL | Status: AC
  Filled 2013-01-06: qty 25

## 2013-01-06 MED ORDER — METOPROLOL TARTRATE 1 MG/ML IV SOLN
5.0000 mg | INTRAVENOUS | Status: DC | PRN
  Administered 2013-01-06: 5 mg via INTRAVENOUS
  Filled 2013-01-06: qty 5

## 2013-01-06 MED ORDER — IOHEXOL 350 MG/ML SOLN
80.0000 mL | Freq: Once | INTRAVENOUS | Status: AC | PRN
  Administered 2013-01-06: 80 mL via INTRAVENOUS

## 2013-01-06 MED ORDER — METOPROLOL TARTRATE 1 MG/ML IV SOLN
INTRAVENOUS | Status: AC
  Filled 2013-01-06: qty 15

## 2013-01-13 ENCOUNTER — Telehealth: Payer: Self-pay | Admitting: *Deleted

## 2013-01-13 NOTE — Telephone Encounter (Signed)
Cardiac CT 01/18/13 @ 4 pm

## 2013-01-13 NOTE — Telephone Encounter (Signed)
Left message for Logan Alvarez to call and reschedule test.

## 2013-01-18 ENCOUNTER — Encounter (HOSPITAL_COMMUNITY): Payer: Self-pay

## 2013-01-18 ENCOUNTER — Ambulatory Visit (HOSPITAL_COMMUNITY)
Admission: RE | Admit: 2013-01-18 | Discharge: 2013-01-18 | Disposition: A | Discharge status: Home / Self Care | Payer: BC Managed Care – PPO | Source: Ambulatory Visit | Attending: Cardiovascular Disease | Admitting: Cardiovascular Disease

## 2013-01-18 DIAGNOSIS — R079 Chest pain, unspecified: Secondary | ICD-10-CM

## 2013-01-18 MED ORDER — NITROGLYCERIN 0.4 MG SL SUBL: 0.4000 mg | SUBLINGUAL_TABLET | SUBLINGUAL | Status: DC | PRN

## 2013-01-18 MED ORDER — METOPROLOL TARTRATE 1 MG/ML IV SOLN
INTRAVENOUS | Status: AC
  Filled 2013-01-18: qty 5

## 2013-01-18 MED ORDER — METOPROLOL TARTRATE 1 MG/ML IV SOLN: 5.0000 mg | INTRAVENOUS | Status: DC | PRN

## 2013-01-18 MED ORDER — NITROGLYCERIN 0.4 MG SL SUBL
SUBLINGUAL_TABLET | SUBLINGUAL | Status: AC
  Administered 2013-01-18: 17:00:00
  Filled 2013-01-18: qty 25

## 2013-01-18 MED ORDER — IOHEXOL 350 MG/ML SOLN
80.0000 mL | Freq: Once | INTRAVENOUS | Status: AC | PRN
  Administered 2013-01-18: 80 mL via INTRAVENOUS

## 2013-01-19 ENCOUNTER — Telehealth: Payer: Self-pay | Admitting: Cardiovascular Disease

## 2013-01-19 ENCOUNTER — Other Ambulatory Visit: Payer: Self-pay | Admitting: *Deleted

## 2013-01-19 ENCOUNTER — Encounter: Payer: Self-pay | Admitting: *Deleted

## 2013-01-19 MED ORDER — ATORVASTATIN CALCIUM 20 MG PO TABS: 20.0000 mg | ORAL_TABLET | Freq: Every day | ORAL | Status: DC

## 2013-01-19 NOTE — Telephone Encounter (Signed)
SPOKE WITH  PT  A COUPLE  TIMES AS  WELL AS  DR Eden Emms  ANSWERED  QUESTIONS ON  2  DIFFER  CALLS .Zack Seal

## 2013-01-19 NOTE — Telephone Encounter (Signed)
New message  Patient would like to know results of his CT scan, He has additional  questions. He would like to know the score of the calcium on the CT scan. Please call and advise

## 2013-01-20 ENCOUNTER — Ambulatory Visit: Payer: BC Managed Care – PPO | Admitting: Cardiovascular Disease

## 2013-01-23 ENCOUNTER — Other Ambulatory Visit: Payer: Self-pay | Admitting: Cardiovascular Disease

## 2013-01-23 DIAGNOSIS — R079 Chest pain, unspecified: Secondary | ICD-10-CM

## 2013-01-23 LAB — BASIC METABOLIC PANEL
BUN: 14 mg/dL (ref 6–23)
Chloride: 99 mEq/L (ref 96–112)
Creat: 0.94 mg/dL (ref 0.50–1.35)
Glucose, Bld: 167 mg/dL — ABNORMAL HIGH (ref 70–99)
Potassium: 4.4 mEq/L (ref 3.5–5.3)

## 2013-01-23 LAB — CBC WITH DIFFERENTIAL/PLATELET
Basophils Relative: 1 % (ref 0–1)
Eosinophils Absolute: 0.3 10*3/uL (ref 0.0–0.7)
Eosinophils Relative: 5 % (ref 0–5)
Hemoglobin: 16.1 g/dL (ref 13.0–17.0)
MCH: 31.4 pg (ref 26.0–34.0)
MCHC: 35 g/dL (ref 30.0–36.0)
Monocytes Relative: 9 % (ref 3–12)
Neutro Abs: 3.4 10*3/uL (ref 1.7–7.7)
Neutrophils Relative %: 55 % (ref 43–77)
Platelets: 292 10*3/uL (ref 150–400)
RDW: 13.3 % (ref 11.5–15.5)

## 2013-01-23 LAB — PROTIME-INR: Prothrombin Time: 12.9 seconds (ref 11.6–15.2)

## 2013-01-31 ENCOUNTER — Telehealth: Payer: Self-pay | Admitting: Cardiovascular Disease

## 2013-01-31 NOTE — Telephone Encounter (Signed)
PT  AWARE HAVE  LAB  RESULTS./CY

## 2013-01-31 NOTE — Telephone Encounter (Signed)
New Problem:  Pt is asking to see if he Dr. Eden Emms got his pre cath blood work. Pt would like a call back.

## 2013-02-01 ENCOUNTER — Ambulatory Visit (HOSPITAL_COMMUNITY)
Admission: RE | Admit: 2013-02-01 | Discharge: 2013-02-01 | Disposition: A | Discharge status: Home / Self Care | Payer: BC Managed Care – PPO | Source: Ambulatory Visit | Attending: Cardiovascular Disease | Admitting: Cardiovascular Disease

## 2013-02-01 ENCOUNTER — Encounter (HOSPITAL_COMMUNITY)
Admission: RE | Disposition: A | Discharge status: Home / Self Care | Payer: Self-pay | Source: Ambulatory Visit | Attending: Cardiovascular Disease

## 2013-02-01 ENCOUNTER — Encounter (HOSPITAL_COMMUNITY): Payer: Self-pay | Admitting: Nurse Practitioner

## 2013-02-01 DIAGNOSIS — E785 Hyperlipidemia, unspecified: Secondary | ICD-10-CM | POA: Insufficient documentation

## 2013-02-01 DIAGNOSIS — R079 Chest pain, unspecified: Secondary | ICD-10-CM | POA: Insufficient documentation

## 2013-02-01 DIAGNOSIS — Z8249 Family history of ischemic heart disease and other diseases of the circulatory system: Secondary | ICD-10-CM | POA: Insufficient documentation

## 2013-02-01 DIAGNOSIS — I10 Essential (primary) hypertension: Secondary | ICD-10-CM | POA: Insufficient documentation

## 2013-02-01 DIAGNOSIS — Z7982 Long term (current) use of aspirin: Secondary | ICD-10-CM | POA: Insufficient documentation

## 2013-02-01 DIAGNOSIS — I2510 Atherosclerotic heart disease of native coronary artery without angina pectoris: Secondary | ICD-10-CM | POA: Insufficient documentation

## 2013-02-01 HISTORY — PX: LEFT HEART CATHETERIZATION WITH CORONARY ANGIOGRAM: SHX5451

## 2013-02-01 HISTORY — DX: Chest pain, unspecified: R07.9

## 2013-02-01 SURGERY — LEFT HEART CATHETERIZATION WITH CORONARY ANGIOGRAM
Anesthesia: LOCAL

## 2013-02-01 MED ORDER — ACETAMINOPHEN 325 MG PO TABS: 650.0000 mg | ORAL_TABLET | ORAL | Status: DC | PRN

## 2013-02-01 MED ORDER — LIDOCAINE HCL (PF) 1 % IJ SOLN
INTRAMUSCULAR | Status: AC
  Filled 2013-02-01: qty 30

## 2013-02-01 MED ORDER — ASPIRIN 81 MG PO CHEW
81.0000 mg | CHEWABLE_TABLET | ORAL | Status: AC
  Administered 2013-02-01: 81 mg via ORAL

## 2013-02-01 MED ORDER — ASPIRIN 81 MG PO CHEW
CHEWABLE_TABLET | ORAL | Status: AC
  Filled 2013-02-01: qty 1

## 2013-02-01 MED ORDER — DIAZEPAM 2 MG PO TABS: 2.0000 mg | ORAL_TABLET | ORAL | Status: DC | PRN

## 2013-02-01 MED ORDER — SODIUM CHLORIDE 0.9 % IJ SOLN: 3.0000 mL | INTRAMUSCULAR | Status: DC | PRN

## 2013-02-01 MED ORDER — SODIUM CHLORIDE 0.45 % IV SOLN: INTRAVENOUS | Status: AC

## 2013-02-01 MED ORDER — SODIUM CHLORIDE 0.9 % IV SOLN
INTRAVENOUS | Status: DC
  Administered 2013-02-01: 1000 mL via INTRAVENOUS
  Administered 2013-02-01: 07:00:00 via INTRAVENOUS

## 2013-02-01 MED ORDER — VERAPAMIL HCL 2.5 MG/ML IV SOLN
INTRAVENOUS | Status: AC
  Filled 2013-02-01: qty 2

## 2013-02-01 MED ORDER — NITROGLYCERIN 0.2 MG/ML ON CALL CATH LAB
INTRAVENOUS | Status: AC
  Filled 2013-02-01: qty 1

## 2013-02-01 MED ORDER — SODIUM CHLORIDE 0.9 % IJ SOLN: 3.0000 mL | Freq: Two times a day (BID) | INTRAMUSCULAR | Status: DC

## 2013-02-01 MED ORDER — MIDAZOLAM HCL 2 MG/2ML IJ SOLN
INTRAMUSCULAR | Status: AC
  Filled 2013-02-01: qty 2

## 2013-02-01 MED ORDER — FENTANYL CITRATE 0.05 MG/ML IJ SOLN
INTRAMUSCULAR | Status: AC
  Filled 2013-02-01: qty 2

## 2013-02-01 MED ORDER — OXYCODONE-ACETAMINOPHEN 5-325 MG PO TABS: 1.0000 | ORAL_TABLET | ORAL | Status: DC | PRN

## 2013-02-01 MED ORDER — HEPARIN SODIUM (PORCINE) 1000 UNIT/ML IJ SOLN
INTRAMUSCULAR | Status: AC
  Filled 2013-02-01: qty 1

## 2013-02-01 MED ORDER — HEPARIN (PORCINE) IN NACL 2-0.9 UNIT/ML-% IJ SOLN
INTRAMUSCULAR | Status: AC
  Filled 2013-02-01: qty 1000

## 2013-02-01 MED ORDER — SODIUM CHLORIDE 0.9 % IV SOLN: 250.0000 mL | INTRAVENOUS | Status: DC | PRN

## 2013-02-01 MED ORDER — ONDANSETRON HCL 4 MG/2ML IJ SOLN: 4.0000 mg | Freq: Four times a day (QID) | INTRAMUSCULAR | Status: DC | PRN

## 2013-02-01 NOTE — Interval H&P Note (Signed)
History and Physical Interval Note:  02/01/2013 8:28 AM  Logan Alvarez  has presented today for surgery, with the diagnosis of Chest pain  The various methods of treatment have been discussed with the patient and family. After consideration of risks, benefits and other options for treatment, the patient has consented to  Procedure(s): LEFT HEART CATHETERIZATION WITH CORONARY ANGIOGRAM (N/A) as a surgical intervention .  The patient's history has been reviewed, patient examined, no change in status, stable for surgery.  I have reviewed the patient's chart and labs.  Questions were answered to the patient's satisfaction.     Charlton Haws

## 2013-02-01 NOTE — H&P (Signed)
Patient ID: Logan Alvarez MRN: [id number], DOB/AGE: 1955/10/29   Admit date: 02/01/2013   Primary Physician: Lilyan Punt, MD Primary Cardiologist: P. Eden Emms, MD   Pt. Profile:  57 y/o male with h/o chest pain and recent abnl cardiac CTA who presents for cath today.  Problem List  Past Medical History  Diagnosis Date  . Hypertension   . Hyperlipidemia   . Testosterone deficiency 2009  . Chest pain     a. 2008: Normal ETT;  b. 01/2013 CTA: Ca score of 715 (92%'ile), LAD 50p, D1 50-75, RCA 50p.    No past surgical history on file.   Allergies  Allergies  Allergen Reactions  . Lodine [Etodolac] Nausea And Vomiting    HPI  Referred by Dr Gerda Diss for SSCP. Last seen in 2008 for atypical pain. Had normal ETT at that time. Recurrent symptoms 04/14/11. Intermittent, sharp pains can radiate to both arms. More stress lately and ? History of panic attacks. Positive family history of CAD Father MI in his 8's. 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 Thinks some of his pain is muscular.  Wife died last 2011-09-19  I think he had issues with anxiety before and is not coping with wifes death well. She had problems with chronic pain and opiate addiction.  He continues to have spasms of chest pain Some exertional Not related to food. Anxiety is better. Pain not positional or pleuritic.  Can last minutes. No associated  Diaphoresis or dyspnea  04/25/11 stress echo was low risk but not diagnostic due to poor images.  Saw Dr. Eden Emms 11/17 and was set up for cardiac CTA, which subsequently showed a Ca2+ score of 715 and LAD, Diag, and RCA dzs.  He presents today for diagnostic cath.  Home Medications  Prior to Admission medications   Medication Sig Start Date End Date Taking? Authorizing Provider  ALPRAZolam Prudy Feeler) 0.5 MG tablet Take 0.25-0.5 mg by mouth 4 (four) times daily as needed for anxiety or sleep.   Yes Historical Provider, MD  aspirin 81 MG tablet Take 81 mg  by mouth daily.   Yes Historical Provider, MD  atorvastatin (LIPITOR) 20 MG tablet Take 20 mg by mouth daily.   Yes Historical Provider, MD  gemfibrozil (LOPID) 600 MG tablet Take 600 mg by mouth 2 (two) times daily before a meal.   Yes Historical Provider, MD   Family History  Family History  Problem Relation Age of Onset  . Heart attack Father     61's   Social History  History   Social History  . Marital Status: Widowed    Spouse Name: N/A    Number of Children: N/A  . Years of Education: N/A   Occupational History  . Not on file.   Social History Main Topics  . Smoking status: Never Smoker   . Smokeless tobacco: Never Used  . Alcohol Use: No  . Drug Use: No  . Sexual Activity: Not on file   Other Topics Concern  . Not on file   Social History Narrative  . No narrative on file    Review of Systems General:  No chills, fever, night sweats or weight changes.  Cardiovascular:  No chest pain, dyspnea on exertion, edema, orthopnea, palpitations, paroxysmal nocturnal dyspnea. Dermatological: No rash, lesions/masses Respiratory: No cough, dyspnea Urologic: No hematuria, dysuria Abdominal:   No nausea, vomiting, diarrhea, bright red blood per rectum, melena, or hematemesis Neurologic:  No visual changes, wkns, changes in mental status. All other systems reviewed and are otherwise negative except as noted above.  Physical Exam  Blood pressure 143/87, pulse 81, temperature 97.1 F (36.2 C), temperature source Oral, resp. rate 20, height 5\' 9"  (1.753 m), weight 235 lb (106.595 kg), SpO2 100.00%.  General: Pleasant, NAD Psych: Normal affect. Neuro: Alert and oriented X 3. Moves all extremities spontaneously. HEENT: Normal  Neck: Supple without bruits or JVD. Lungs:  Resp regular and unlabored, CTA. Heart: RRR no s3, s4, or murmurs. Abdomen: Soft, non-tender, non-distended, BS + x 4.  Extremities: No clubbing, cyanosis or edema. DP/PT/Radials 2+ and equal  bilaterally.  Labs  Lab Results  Component Value Date   WBC 6.0 01/23/2013   HGB 16.1 01/23/2013   HCT 46.0 01/23/2013   MCV 89.7 01/23/2013   PLT 292 01/23/2013   ASSESSMENT AND PLAN  1. Chest Pain:  abnl cardiac CTA on 12/17.  For cath today.  2. HTN:  On no meds at home.  BP elevated this am.  3.  HL:  Cont statin.  Signed, Nicolasa Ducking, NP 02/01/2013, 8:20 AM

## 2013-02-01 NOTE — CV Procedure (Signed)
Cardiac Catheterization Procedure Note  Name: Logan Alvarez MRN: [id number] DOB: 11/25/1955  Procedure: Left Heart Cath, Selective Coronary Angiography, LV angiography  Indication: Chest Pain Cardiac CT with abnormal D1/LAD   Procedural Details: The right wrist was prepped, draped, and anesthetized with 1% lidocaine. Using the modified Seldinger technique, a 5 French sheath was introduced into the right radial artery. 3 mg of verapamil was administered through the sheath, weight-based unfractionated heparin was administered intravenously. Standard Judkins catheters were used for selective coronary angiography and left ventriculography. Catheter exchanges were performed over an exchange length guidewire. There were no immediate procedural complications. A TR band was used for radial hemostasis at the completion of the procedure.  The patient was transferred to the post catheterization recovery area for further monitoring.  Coronary Arteries: Right dominant with no anomalies  LM: Normal  LAD: 30% mid and 40% distal    D1- Small artery less than 2mm with branching  80% diffuse proximally in tubular fashion Superior branch also with 70% disease   D2- Small and normal  Circumflex: 40% mid vessel disease  OM1- Normal   RCA:  20% proximal and mid vessel stenosis   PDA- Normal  PLB- Normal  Ventriculography: EF: 60 %, no RWMA;s  Hemodynamics:  Aortic Pressure: 123 83 mmHg  LV Pressure: 126 16  mmHg   Final Conclusions:  Single branch vessel disease Reviewed films with Dr [place] D1 too small (less than 2mm) with branch point  Would not do well with stenting Medical Rx  Recommendations:  Medical Rx   Charlton Haws 02/01/2013, 9:24 AM

## 2013-02-25 ENCOUNTER — Other Ambulatory Visit: Payer: Self-pay | Admitting: Family Medicine

## 2013-02-27 NOTE — Telephone Encounter (Signed)
May refill x3. Will need office visit before further.

## 2013-05-11 ENCOUNTER — Telehealth: Payer: Self-pay | Admitting: Family Medicine

## 2013-05-11 NOTE — Telephone Encounter (Signed)
Pt needs OV, staff to call, labs seen, will review with pt when follows up

## 2013-05-11 NOTE — Telephone Encounter (Signed)
Nurses, I sent the chart your way. Labs look reasonable but he needs comprehensive visit and discussion for exam and risk factor discussion (Wellness PE) has been a year

## 2013-05-11 NOTE — Telephone Encounter (Signed)
Patient had labs done and would the results of them.

## 2013-05-11 NOTE — Telephone Encounter (Signed)
Patient has wellness exam scheduled on 05/26/13 according to Epic.

## 2013-05-11 NOTE — Telephone Encounter (Signed)
Patient has an appointment already scheduled in Epic for 05/26/13 with Dr. Nicki Reaper for a physical. Left message on voicemail notifying patient labs will be discussed with him at that appointment.

## 2013-05-26 ENCOUNTER — Ambulatory Visit (INDEPENDENT_AMBULATORY_CARE_PROVIDER_SITE_OTHER): Payer: BC Managed Care – PPO | Admitting: Family Medicine

## 2013-05-26 ENCOUNTER — Encounter: Payer: Self-pay | Admitting: Family Medicine

## 2013-05-26 VITALS — BP 122/86 | Ht 69.0 in | Wt 241.2 lb

## 2013-05-26 DIAGNOSIS — E782 Mixed hyperlipidemia: Secondary | ICD-10-CM

## 2013-05-26 DIAGNOSIS — I10 Essential (primary) hypertension: Secondary | ICD-10-CM

## 2013-05-26 DIAGNOSIS — Z79899 Other long term (current) drug therapy: Secondary | ICD-10-CM

## 2013-05-26 DIAGNOSIS — Z0000 Encounter for general adult medical examination without abnormal findings: Secondary | ICD-10-CM

## 2013-05-26 MED ORDER — ATORVASTATIN CALCIUM 40 MG PO TABS: 40.0000 mg | ORAL_TABLET | Freq: Every day | ORAL | Status: DC

## 2013-05-26 MED ORDER — ALPRAZOLAM 0.5 MG PO TABS: ORAL_TABLET | ORAL | Status: DC

## 2013-05-26 NOTE — Progress Notes (Signed)
   Subjective:    Patient ID: REG BIRCHER, male    DOB: [birth year], 58 y.o.   MRN: [id number]  HPI Long discussion held regarding his lab work also held regarding his health habits in the importance of keeping stress under control also discussed with him the importance of taking his medications reducing cardiovascular risk as well.   Review of Systems  Constitutional: Negative for fever, activity change and appetite change.  HENT: Negative for congestion and rhinorrhea.   Eyes: Negative for discharge.  Respiratory: Negative for cough and wheezing.   Cardiovascular: Negative for chest pain.  Gastrointestinal: Negative for vomiting, abdominal pain and blood in stool.  Genitourinary: Negative for frequency and difficulty urinating.  Musculoskeletal: Negative for neck pain.  Skin: Negative for rash.  Allergic/Immunologic: Negative for environmental allergies and food allergies.  Neurological: Negative for weakness and headaches.  Psychiatric/Behavioral: Negative for agitation.       Objective:   Physical Exam  Constitutional: He appears well-developed and well-nourished.  HENT:  Head: Normocephalic and atraumatic.  Right Ear: External ear normal.  Left Ear: External ear normal.  Nose: Nose normal.  Mouth/Throat: Oropharynx is clear and moist.  Eyes: EOM are normal. Pupils are equal, round, and reactive to light.  Neck: Normal range of motion. Neck supple. No thyromegaly present.  Cardiovascular: Normal rate, regular rhythm and normal heart sounds.   No murmur heard. Pulmonary/Chest: Effort normal and breath sounds normal. No respiratory distress. He has no wheezes.  Abdominal: Soft. Bowel sounds are normal. He exhibits no distension and no mass. There is no tenderness.  Genitourinary: Penis normal.  Musculoskeletal: Normal range of motion. He exhibits no edema.  Lymphadenopathy:    He has no cervical adenopathy.  Neurological: He is alert. He exhibits normal muscle tone.    Skin: Skin is warm and dry. No erythema.  Psychiatric: He has a normal mood and affect. His behavior is normal. Judgment normal.          Assessment & Plan:  The patient comes in today for a wellness visit.  A review of their health history was completed.  A review of medications was also completed. Any necessary refills were discussed. Sensible healthy diet was discussed. Importance of minimizing excessive salt and carbohydrates was also discussed. Safety was stressed including driving, activities at work and at home where applicable. Importance of regular physical activity for overall health was discussed. Preventative measures appropriate for age were discussed. Time was spent with the patient discussing any concerns they have about their well-being.  Long discussion held with patient regarding his cholesterol he will stop Lopid. He will continue atorvastatin. He will work toward exercising trying to cut down on the amount of beers he drinks plus also watching his diet he will followup again with [place] in approximately 12 weeks. We are increasing H. atorvastatin from 20 mg to 40 mg. The goal is to get the LDL below 70. We will also look at liver enzymes more than likely he is dealing with fatty liver may need ultrasound and other testing in the future if enzymes rise  He was also strongly encouraged to get his colonoscopy  Xanax reduce to 3 times per day hopefully this will work if not we may have to go back to 4 times a day as necessary cautioned drowsiness

## 2013-05-26 NOTE — Patient Instructions (Signed)
DASH Diet  The DASH diet stands for "Dietary Approaches to Stop Hypertension." It is a healthy eating plan that has been shown to reduce high blood pressure (hypertension) in as little as 14 days, while also possibly providing other significant health benefits. These other health benefits include reducing the risk of breast cancer after menopause and reducing the risk of type 2 diabetes, heart disease, colon cancer, and stroke. Health benefits also include weight loss and slowing kidney failure in patients with chronic kidney disease.   DIET GUIDELINES  · Limit salt (sodium). Your diet should contain less than 1500 mg of sodium daily.  · Limit refined or processed carbohydrates. Your diet should include mostly whole grains. Desserts and added sugars should be used sparingly.  · Include small amounts of heart-healthy fats. These types of fats include nuts, oils, and tub margarine. Limit saturated and trans fats. These fats have been shown to be harmful in the body.  CHOOSING FOODS   The following food groups are based on a 2000 calorie diet. See your Registered Dietitian for individual calorie needs.  Grains and Grain Products (6 to 8 servings daily)  · Eat More Often: Whole-wheat bread, brown rice, whole-grain or wheat pasta, quinoa, popcorn without added fat or salt (air popped).  · Eat Less Often: White bread, white pasta, white rice, cornbread.  Vegetables (4 to 5 servings daily)  · Eat More Often: Fresh, frozen, and canned vegetables. Vegetables may be raw, steamed, roasted, or grilled with a minimal amount of fat.  · Eat Less Often/Avoid: Creamed or fried vegetables. Vegetables in a cheese sauce.  Fruit (4 to 5 servings daily)  · Eat More Often: All fresh, canned (in natural juice), or frozen fruits. Dried fruits without added sugar. One hundred percent fruit juice (½ cup [237 mL] daily).  · Eat Less Often: Dried fruits with added sugar. Canned fruit in light or heavy syrup.  Lean Meats, Fish, and Poultry (2  servings or less daily. One serving is 3 to 4 oz [85-114 g]).  · Eat More Often: Ninety percent or leaner ground beef, tenderloin, sirloin. Round cuts of beef, chicken breast, turkey breast. All fish. Grill, bake, or broil your meat. Nothing should be fried.  · Eat Less Often/Avoid: Fatty cuts of meat, turkey, or chicken leg, thigh, or wing. Fried cuts of meat or fish.  Dairy (2 to 3 servings)  · Eat More Often: Low-fat or fat-free milk, low-fat plain or light yogurt, reduced-fat or part-skim cheese.  · Eat Less Often/Avoid: Milk (whole, 2%). Whole milk yogurt. Full-fat cheeses.  Nuts, Seeds, and Legumes (4 to 5 servings per week)  · Eat More Often: All without added salt.  · Eat Less Often/Avoid: Salted nuts and seeds, canned beans with added salt.  Fats and Sweets (limited)  · Eat More Often: Vegetable oils, tub margarines without trans fats, sugar-free gelatin. Mayonnaise and salad dressings.  · Eat Less Often/Avoid: Coconut oils, palm oils, butter, stick margarine, cream, half and half, cookies, candy, pie.  FOR MORE INFORMATION  The Dash Diet Eating Plan: www.dashdiet.org  Document Released: 01/08/2011 Document Revised: 04/13/2011 Document Reviewed: 01/08/2011  [employer]® Patient Information ©2014 [employer].

## 2013-05-26 NOTE — Progress Notes (Signed)
   Subjective:    Patient ID: Logan Alvarez, male    DOB: 26-Mar-1955, 58 y.o.   MRN: [id number]  HPI Patient arrives for an annual physical. Patient would like to discuss health assessment done at work. Patient also has a sore nodule under his left arm that has bothered him for years he would like assessed. Patient would also like to discuss results from cardiologist.   Review of Systems     Objective:   Physical Exam        Assessment & Plan:

## 2013-08-21 ENCOUNTER — Telehealth: Payer: Self-pay | Admitting: Family Medicine

## 2013-08-21 DIAGNOSIS — R740 Nonspecific elevation of levels of transaminase and lactic acid dehydrogenase [LDH]: Secondary | ICD-10-CM

## 2013-08-21 DIAGNOSIS — E876 Hypokalemia: Secondary | ICD-10-CM

## 2013-08-21 DIAGNOSIS — R7401 Elevation of levels of liver transaminase levels: Secondary | ICD-10-CM

## 2013-08-21 DIAGNOSIS — E785 Hyperlipidemia, unspecified: Secondary | ICD-10-CM

## 2013-08-21 NOTE — Telephone Encounter (Signed)
On 05/26/13, had: lipid, liver, met7

## 2013-08-21 NOTE — Telephone Encounter (Signed)
Lipid, liver, metabolic 7-hyperlipidemia, elevated transaminase, hypokalemia

## 2013-08-21 NOTE — Telephone Encounter (Signed)
Does patient need to have blood work done before appointment on 08/25/13?

## 2013-08-22 NOTE — Telephone Encounter (Signed)
Notified patient via VM stating blood work orders are in. 

## 2013-08-23 LAB — LIPID PANEL
Cholesterol: 120 mg/dL (ref 0–200)
HDL: 41 mg/dL (ref >39)
LDL Cholesterol: 60 mg/dL (ref 0–99)
TRIGLYCERIDES: 93 mg/dL (ref <150)
Total CHOL/HDL Ratio: 2.9 Ratio
VLDL: 19 mg/dL (ref 0–40)

## 2013-08-23 LAB — BASIC METABOLIC PANEL
BUN: 7 mg/dL (ref 6–23)
CO2: 26 meq/L (ref 19–32)
CREATININE: 0.85 mg/dL (ref 0.50–1.35)
Calcium: 9.6 mg/dL (ref 8.4–10.5)
Chloride: 106 mEq/L (ref 96–112)
Glucose, Bld: 95 mg/dL (ref 70–99)
Potassium: 4.4 mEq/L (ref 3.5–5.3)
Sodium: 142 mEq/L (ref 135–145)

## 2013-08-23 LAB — HEPATIC FUNCTION PANEL
ALT: 40 U/L (ref 0–53)
AST: 36 U/L (ref 0–37)
Albumin: 4.2 g/dL (ref 3.5–5.2)
Alkaline Phosphatase: 112 U/L (ref 39–117)
BILIRUBIN INDIRECT: 1.5 mg/dL — AB (ref 0.2–1.2)
Bilirubin, Direct: 0.3 mg/dL (ref 0.0–0.3)
TOTAL PROTEIN: 6.9 g/dL (ref 6.0–8.3)
Total Bilirubin: 1.8 mg/dL — ABNORMAL HIGH (ref 0.2–1.2)

## 2013-08-25 ENCOUNTER — Ambulatory Visit (INDEPENDENT_AMBULATORY_CARE_PROVIDER_SITE_OTHER): Payer: BC Managed Care – PPO | Admitting: Family Medicine

## 2013-08-25 ENCOUNTER — Encounter: Payer: Self-pay | Admitting: Family Medicine

## 2013-08-25 VITALS — BP 128/84 | Ht 69.0 in | Wt 239.0 lb

## 2013-08-25 DIAGNOSIS — F411 Generalized anxiety disorder: Secondary | ICD-10-CM

## 2013-08-25 DIAGNOSIS — E782 Mixed hyperlipidemia: Secondary | ICD-10-CM

## 2013-08-25 DIAGNOSIS — F419 Anxiety disorder, unspecified: Secondary | ICD-10-CM

## 2013-08-25 DIAGNOSIS — I10 Essential (primary) hypertension: Secondary | ICD-10-CM

## 2013-08-25 MED ORDER — ALPRAZOLAM 0.5 MG PO TABS: ORAL_TABLET | ORAL | Status: DC

## 2013-08-25 MED ORDER — ATORVASTATIN CALCIUM 40 MG PO TABS: 40.0000 mg | ORAL_TABLET | Freq: Every day | ORAL | Status: DC

## 2013-08-25 NOTE — Progress Notes (Signed)
   Subjective:    Patient ID: Logan Alvarez, male    DOB: [birth year], 58 y.o.   MRN: [id number]  Hypertension This is a chronic problem. The current episode started more than 1 year ago. Risk factors for coronary artery disease include dyslipidemia and male gender.   He is watching his diet he is trying to stay healthy with his medications as well he denies being nauseated vomiting diarrhea chest pain or shortness of breath. He does have chronic anxiety issues he has to take his Xanax 3 times a day failure to do so doesn't go well. He has had a PSA in the past that went well   Review of Systems     Objective:   Physical Exam Blood pressure good on recheck lungs are clear hearts regular pulse normal abdomen soft extremities no edema       Assessment & Plan:  #1 I recommended colonoscopy sheet was given  HTN good control  Hyperlipidemia good control continue current measures  Anxiety issues continue current measures prescription given, patient states at times he gets sad about his wife being passed away but he does not want to be on any antidepressant he states this is a passing thing that occurs occasionally here and there  No sign of diabetes healthy diet recommended exercise recommended

## 2013-08-26 ENCOUNTER — Other Ambulatory Visit: Payer: Self-pay | Admitting: Family Medicine

## 2013-10-30 ENCOUNTER — Other Ambulatory Visit: Payer: Self-pay | Admitting: *Deleted

## 2013-10-30 MED ORDER — ATORVASTATIN CALCIUM 40 MG PO TABS: ORAL_TABLET | ORAL | Status: DC

## 2014-01-11 ENCOUNTER — Encounter (HOSPITAL_COMMUNITY): Payer: Self-pay | Admitting: Cardiovascular Disease

## 2014-02-21 ENCOUNTER — Ambulatory Visit (INDEPENDENT_AMBULATORY_CARE_PROVIDER_SITE_OTHER): Payer: BLUE CROSS/BLUE SHIELD | Admitting: Family Medicine

## 2014-02-21 ENCOUNTER — Encounter: Payer: Self-pay | Admitting: Family Medicine

## 2014-02-21 VITALS — BP 134/70 | Temp 99.0°F | Ht 69.0 in | Wt 244.4 lb

## 2014-02-21 DIAGNOSIS — R509 Fever, unspecified: Secondary | ICD-10-CM

## 2014-02-21 DIAGNOSIS — R062 Wheezing: Secondary | ICD-10-CM

## 2014-02-21 DIAGNOSIS — J0100 Acute maxillary sinusitis, unspecified: Secondary | ICD-10-CM

## 2014-02-21 MED ORDER — CEFTRIAXONE SODIUM 1 G IJ SOLR
500.0000 mg | Freq: Once | INTRAMUSCULAR | Status: AC
  Administered 2014-02-21: 500 mg via INTRAMUSCULAR

## 2014-02-21 MED ORDER — LEVOFLOXACIN 500 MG PO TABS: 500.0000 mg | ORAL_TABLET | Freq: Every day | ORAL | Status: DC

## 2014-02-21 MED ORDER — ALBUTEROL SULFATE HFA 108 (90 BASE) MCG/ACT IN AERS: 2.0000 | INHALATION_SPRAY | Freq: Four times a day (QID) | RESPIRATORY_TRACT | Status: DC | PRN

## 2014-02-21 NOTE — Patient Instructions (Signed)
How to Use an Inhaler Proper inhaler technique is very important. Good technique ensures that the medicine reaches the lungs. Poor technique results in depositing the medicine on the tongue and back of the throat rather than in the airways. If you do not use the inhaler with good technique, the medicine will not help you. STEPS TO FOLLOW IF USING AN INHALER WITHOUT AN EXTENSION TUBE 1. Remove the cap from the inhaler. 2. If you are using the inhaler for the first time, you will need to prime it. Shake the inhaler for 5 seconds and release four puffs into the air, away from your face. Ask your health care provider or pharmacist if you have questions about priming your inhaler. 3. Shake the inhaler for 5 seconds before each breath in (inhalation). 4. Position the inhaler so that the top of the canister faces up. 5. Put your index finger on the top of the medicine canister. Your thumb supports the bottom of the inhaler. 6. Open your mouth. 7. Either place the inhaler between your teeth and place your lips tightly around the mouthpiece, or hold the inhaler 1-2 inches away from your open mouth. If you are unsure of which technique to use, ask your health care provider. 8. Breathe out (exhale) normally and as completely as possible. 9. Press the canister down with your index finger to release the medicine. 10. At the same time as the canister is pressed, inhale deeply and slowly until your lungs are completely filled. This should take 4-6 seconds. Keep your tongue down. 11. Hold the medicine in your lungs for 5-10 seconds (10 seconds is best). This helps the medicine get into the small airways of your lungs. 12. Breathe out slowly, through pursed lips. Whistling is an example of pursed lips. 13. Wait at least 15-30 seconds between puffs. Continue with the above steps until you have taken the number of puffs your health care provider has ordered. Do not use the inhaler more than your health care provider  tells you. 14. Replace the cap on the inhaler. 15. Follow the directions from your health care provider or the inhaler insert for cleaning the inhaler. STEPS TO FOLLOW IF USING AN INHALER WITH AN EXTENSION (SPACER) 1. Remove the cap from the inhaler. 2. If you are using the inhaler for the first time, you will need to prime it. Shake the inhaler for 5 seconds and release four puffs into the air, away from your face. Ask your health care provider or pharmacist if you have questions about priming your inhaler. 3. Shake the inhaler for 5 seconds before each breath in (inhalation). 4. Place the open end of the spacer onto the mouthpiece of the inhaler. 5. Position the inhaler so that the top of the canister faces up and the spacer mouthpiece faces you. 6. Put your index finger on the top of the medicine canister. Your thumb supports the bottom of the inhaler and the spacer. 7. Breathe out (exhale) normally and as completely as possible. 8. Immediately after exhaling, place the spacer between your teeth and into your mouth. Close your lips tightly around the spacer. 9. Press the canister down with your index finger to release the medicine. 10. At the same time as the canister is pressed, inhale deeply and slowly until your lungs are completely filled. This should take 4-6 seconds. Keep your tongue down and out of the way. 11. Hold the medicine in your lungs for 5-10 seconds (10 seconds is best). This helps the   medicine get into the small airways of your lungs. Exhale. 12. Repeat inhaling deeply through the spacer mouthpiece. Again hold that breath for up to 10 seconds (10 seconds is best). Exhale slowly. If it is difficult to take this second deep breath through the spacer, breathe normally several times through the spacer. Remove the spacer from your mouth. 13. Wait at least 15-30 seconds between puffs. Continue with the above steps until you have taken the number of puffs your health care provider has  ordered. Do not use the inhaler more than your health care provider tells you. 14. Remove the spacer from the inhaler, and place the cap on the inhaler. 15. Follow the directions from your health care provider or the inhaler insert for cleaning the inhaler and spacer. If you are using different kinds of inhalers, use your quick relief medicine to open the airways 10-15 minutes before using a steroid if instructed to do so by your health care provider. If you are unsure which inhalers to use and the order of using them, ask your health care provider, nurse, or respiratory therapist. If you are using a steroid inhaler, always rinse your mouth with water after your last puff, then gargle and spit out the water. Do not swallow the water. AVOID:  Inhaling before or after starting the spray of medicine. It takes practice to coordinate your breathing with triggering the spray.  Inhaling through the nose (rather than the mouth) when triggering the spray. HOW TO DETERMINE IF YOUR INHALER IS FULL OR NEARLY EMPTY You cannot know when an inhaler is empty by shaking it. A few inhalers are now being made with dose counters. Ask your health care provider for a prescription that has a dose counter if you feel you need that extra help. If your inhaler does not have a counter, ask your health care provider to help you determine the date you need to refill your inhaler. Write the refill date on a calendar or your inhaler canister. Refill your inhaler 7-10 days before it runs out. Be sure to keep an adequate supply of medicine. This includes making sure it is not expired, and that you have a spare inhaler.  SEEK MEDICAL CARE IF:   Your symptoms are only partially relieved with your inhaler.  You are having trouble using your inhaler.  You have some increase in phlegm. SEEK IMMEDIATE MEDICAL CARE IF:   You feel little or no relief with your inhalers. You are still wheezing and are feeling shortness of breath or  tightness in your chest or both.  You have dizziness, headaches, or a fast heart rate.  You have chills, fever, or night sweats.  You have a noticeable increase in phlegm production, or there is blood in the phlegm. MAKE SURE YOU:   Understand these instructions.  Will watch your condition.  Will get help right away if you are not doing well or get worse. Document Released: 01/17/2000 Document Revised: 11/09/2012 Document Reviewed: 08/18/2012 [employer] Patient Information 2015 [employer]. This information is not intended to replace advice given to you by your health care provider. Make sure you discuss any questions you have with your health care provider.  

## 2014-02-21 NOTE — Progress Notes (Signed)
   Subjective:    Patient ID: Logan Alvarez, male    DOB: [birth year], 59 y.o.   MRN: [id number]  Cough This is a new problem. The current episode started in the past 7 days. Associated symptoms include headaches, myalgias, nasal congestion and wheezing. Associated symptoms comments: diarrhea. The symptoms are aggravated by fumes. Treatments tried: nyquil.    Patient will follow-up for regular wellness visit later this spring  Review of Systems  Respiratory: Positive for cough and wheezing.   Musculoskeletal: Positive for myalgias.  Neurological: Positive for headaches.       Objective:   Physical Exam  Bilateral expiratory wheezes heart regular pulse normal not respiratory distress mild sinus tenderness Rocephin shot today because of illness     Assessment & Plan:  Patient started off with a viral illness them progressed into sinus infection as well as acute bronchitis with reactive airway I do recommend antibiotics, albuterol, plenty of rest. Warning signs were discussed follow-up if ongoing troubles

## 2014-02-22 ENCOUNTER — Other Ambulatory Visit: Payer: Self-pay | Admitting: *Deleted

## 2014-02-22 ENCOUNTER — Telehealth: Payer: Self-pay | Admitting: Family Medicine

## 2014-02-22 MED ORDER — HYDROCODONE-HOMATROPINE 5-1.5 MG/5ML PO SYRP: ORAL_SOLUTION | ORAL | Status: DC

## 2014-02-22 NOTE — Telephone Encounter (Signed)
Patient was seen yesterday by DR. Scott for sinusitis.  He wants to know if there is anything that we can call in to help get the congestion out of his chest.  He said he is coughing so bad that its making his stomach and chest sore.  Logan Alvarez

## 2014-02-22 NOTE — Telephone Encounter (Signed)
Pt taking Robu DM and states it triggers him to cough and he cant' stop. Seen yesterday for sinus infection, bronchitis, and reac airways. Pt requesting hycodan cough syrup. He was prescribed levaquin and ventolin and given a rocephin injection.

## 2014-02-22 NOTE — Telephone Encounter (Signed)
Hycodan one teaspoon qhs prn cough per Dr. Nicki Reaper. Pt notifed script ready for pickup.

## 2014-02-27 ENCOUNTER — Other Ambulatory Visit: Payer: Self-pay | Admitting: Family Medicine

## 2014-03-02 ENCOUNTER — Other Ambulatory Visit: Payer: Self-pay | Admitting: Family Medicine

## 2014-03-02 ENCOUNTER — Telehealth: Payer: Self-pay | Admitting: Family Medicine

## 2014-03-02 MED ORDER — ALPRAZOLAM 0.5 MG PO TABS: ORAL_TABLET | ORAL | Status: DC

## 2014-03-02 NOTE — Telephone Encounter (Signed)
ALPRAZolam (XANAX) 0.5 MG tablet   Pt needs this refilled, has been trying for 2 days to get this done Pharmacy says we are not replying  Elwin Sleight   His old scripts have expired needs new ones

## 2014-03-02 NOTE — Telephone Encounter (Signed)
Rx faxed to pharmacy. Patient notified. 

## 2014-03-02 NOTE — Telephone Encounter (Signed)
This patient may have 1 refill but he also needs to schedule office visit for chronic health issues last seen July last year for chronic health

## 2014-03-30 ENCOUNTER — Other Ambulatory Visit: Payer: Self-pay | Admitting: Family Medicine

## 2014-03-31 NOTE — Telephone Encounter (Signed)
May have this +1 refill. Patient must schedule office visit for further refills.

## 2014-04-04 ENCOUNTER — Ambulatory Visit (INDEPENDENT_AMBULATORY_CARE_PROVIDER_SITE_OTHER): Payer: BLUE CROSS/BLUE SHIELD | Admitting: Family Medicine

## 2014-04-04 ENCOUNTER — Encounter: Payer: Self-pay | Admitting: Family Medicine

## 2014-04-04 VITALS — BP 126/90 | Ht 69.0 in | Wt 243.2 lb

## 2014-04-04 DIAGNOSIS — B9689 Other specified bacterial agents as the cause of diseases classified elsewhere: Secondary | ICD-10-CM

## 2014-04-04 DIAGNOSIS — J019 Acute sinusitis, unspecified: Secondary | ICD-10-CM

## 2014-04-04 DIAGNOSIS — F419 Anxiety disorder, unspecified: Secondary | ICD-10-CM | POA: Diagnosis not present

## 2014-04-04 DIAGNOSIS — I10 Essential (primary) hypertension: Secondary | ICD-10-CM

## 2014-04-04 DIAGNOSIS — R350 Frequency of micturition: Secondary | ICD-10-CM | POA: Diagnosis not present

## 2014-04-04 DIAGNOSIS — E782 Mixed hyperlipidemia: Secondary | ICD-10-CM | POA: Diagnosis not present

## 2014-04-04 LAB — POCT URINALYSIS DIPSTICK
PH UA: 5
Spec Grav, UA: <=1.005

## 2014-04-04 MED ORDER — ATORVASTATIN CALCIUM 40 MG PO TABS: ORAL_TABLET | ORAL | Status: DC

## 2014-04-04 MED ORDER — ALPRAZOLAM 0.5 MG PO TABS: ORAL_TABLET | ORAL | Status: DC

## 2014-04-04 MED ORDER — CIPROFLOXACIN HCL 500 MG PO TABS: 500.0000 mg | ORAL_TABLET | Freq: Two times a day (BID) | ORAL | Status: DC

## 2014-04-04 NOTE — Patient Instructions (Signed)
DASH Eating Plan °DASH stands for "Dietary Approaches to Stop Hypertension." The DASH eating plan is a healthy eating plan that has been shown to reduce high blood pressure (hypertension). Additional health benefits may include reducing the risk of type 2 diabetes mellitus, heart disease, and stroke. The DASH eating plan may also help with weight loss. °WHAT DO I NEED TO KNOW ABOUT THE DASH EATING PLAN? °For the DASH eating plan, you will follow these general guidelines: °· Choose foods with a percent daily value for sodium of less than 5% (as listed on the food label). °· Use salt-free seasonings or herbs instead of table salt or sea salt. °· Check with your health care provider or pharmacist before using salt substitutes. °· Eat lower-sodium products, often labeled as "lower sodium" or "no salt added." °· Eat fresh foods. °· Eat more vegetables, fruits, and low-fat dairy products. °· Choose whole grains. Look for the word "whole" as the first word in the ingredient list. °· Choose fish and skinless chicken or turkey more often than red meat. Limit fish, poultry, and meat to 6 oz (170 g) each day. °· Limit sweets, desserts, sugars, and sugary drinks. °· Choose heart-healthy fats. °· Limit cheese to 1 oz (28 g) per day. °· Eat more home-cooked food and less restaurant, buffet, and fast food. °· Limit fried foods. °· Cook foods using methods other than frying. °· Limit canned vegetables. If you do use them, rinse them well to decrease the sodium. °· When eating at a restaurant, ask that your food be prepared with less salt, or no salt if possible. °WHAT FOODS CAN I EAT? °Seek help from a dietitian for individual calorie needs. °Grains °Whole grain or whole wheat bread. Brown rice. Whole grain or whole wheat pasta. Quinoa, bulgur, and whole grain cereals. Low-sodium cereals. Corn or whole wheat flour tortillas. Whole grain cornbread. Whole grain crackers. Low-sodium crackers. °Vegetables °Fresh or frozen vegetables  (raw, steamed, roasted, or grilled). Low-sodium or reduced-sodium tomato and vegetable juices. Low-sodium or reduced-sodium tomato sauce and paste. Low-sodium or reduced-sodium canned vegetables.  °Fruits °All fresh, canned (in natural juice), or frozen fruits. °Meat and Other Protein Products °Ground beef (85% or leaner), grass-fed beef, or beef trimmed of fat. Skinless chicken or turkey. Ground chicken or turkey. Pork trimmed of fat. All fish and seafood. Eggs. Dried beans, peas, or lentils. Unsalted nuts and seeds. Unsalted canned beans. °Dairy °Low-fat dairy products, such as skim or 1% milk, 2% or reduced-fat cheeses, low-fat ricotta or cottage cheese, or plain low-fat yogurt. Low-sodium or reduced-sodium cheeses. °Fats and Oils °Tub margarines without trans fats. Light or reduced-fat mayonnaise and salad dressings (reduced sodium). Avocado. Safflower, olive, or canola oils. Natural peanut or almond butter. °Other °Unsalted popcorn and pretzels. °The items listed above may not be a complete list of recommended foods or beverages. Contact your dietitian for more options. °WHAT FOODS ARE NOT RECOMMENDED? °Grains °White bread. White pasta. White rice. Refined cornbread. Bagels and croissants. Crackers that contain trans fat. °Vegetables °Creamed or fried vegetables. Vegetables in a cheese sauce. Regular canned vegetables. Regular canned tomato sauce and paste. Regular tomato and vegetable juices. °Fruits °Dried fruits. Canned fruit in light or heavy syrup. Fruit juice. °Meat and Other Protein Products °Fatty cuts of meat. Ribs, chicken wings, bacon, sausage, bologna, salami, chitterlings, fatback, hot dogs, bratwurst, and packaged luncheon meats. Salted nuts and seeds. Canned beans with salt. °Dairy °Whole or 2% milk, cream, half-and-half, and cream cheese. Whole-fat or sweetened yogurt. Full-fat   cheeses or blue cheese. Nondairy creamers and whipped toppings. Processed cheese, cheese spreads, or cheese  curds. °Condiments °Onion and garlic salt, seasoned salt, table salt, and sea salt. Canned and packaged gravies. Worcestershire sauce. Tartar sauce. Barbecue sauce. Teriyaki sauce. Soy sauce, including reduced sodium. Steak sauce. Fish sauce. Oyster sauce. Cocktail sauce. Horseradish. Ketchup and mustard. Meat flavorings and tenderizers. Bouillon cubes. Hot sauce. Tabasco sauce. Marinades. Taco seasonings. Relishes. °Fats and Oils °Butter, stick margarine, lard, shortening, ghee, and bacon fat. Coconut, palm kernel, or palm oils. Regular salad dressings. °Other °Pickles and olives. Salted popcorn and pretzels. °The items listed above may not be a complete list of foods and beverages to avoid. Contact your dietitian for more information. °WHERE CAN I FIND MORE INFORMATION? °National Heart, Lung, and [employer] Institute: www.nhlbi.nih.gov/health/health-topics/topics/dash/ °Document Released: 01/08/2011 Document Revised: 06/05/2013 Document Reviewed: 11/23/2012 °ExitCare® Patient Information ©2015 [employer]. This information is not intended to replace advice given to you by your health care provider. Make sure you discuss any questions you have with your health care provider. ° °

## 2014-04-04 NOTE — Progress Notes (Signed)
   Subjective:    Patient ID: Logan Alvarez, male    DOB: [birth year], 59 y.o.   MRN: [id number]  Hyperlipidemia This is a chronic problem. The current episode started more than 1 year ago. The problem is controlled. There are no known factors aggravating his hyperlipidemia. Pertinent negatives include no chest pain. Current antihyperlipidemic treatment includes statins. The current treatment provides significant improvement of lipids. There are no compliance problems.  There are no known risk factors for coronary artery disease.   Patient states that he has head congestion/ sinus infection that has been present since January 2016. He denies any wheezing chest tightness pressure pain shortness breath  Patient states that he has urinary frequency that has been present for about 4 weeks now. He relates slight dysuria or urinary from C denies any sexual activity  25 minutes was spent with the patient discussing his multiple issues  Review of Systems  Constitutional: Negative for activity change, appetite change and fatigue.  HENT: Negative for congestion.   Respiratory: Negative for cough.   Cardiovascular: Negative for chest pain.  Gastrointestinal: Negative for abdominal pain.  Endocrine: Negative for polydipsia and polyphagia.  Neurological: Negative for weakness.  Psychiatric/Behavioral: Negative for confusion.       Objective:   Physical Exam  Constitutional: He appears well-nourished. No distress.  Cardiovascular: Normal rate, regular rhythm and normal heart sounds.   No murmur heard. Pulmonary/Chest: Effort normal and breath sounds normal. No respiratory distress.  Musculoskeletal: He exhibits no edema.  Lymphadenopathy:    He has no cervical adenopathy.  Neurological: He is alert.  Psychiatric: His behavior is normal.  Vitals reviewed.         Assessment & Plan:  prostatitis-antibodies prescribed for next 3-4 weeks this should cleared up if ongoing trouble  follow-up  Hyperlipidemia to watch fats in the diet stay physically active. Try to lose weight. Take medication.  Patient states he does not want to do any lab work he states his workplace will be doing it in the spring and he will send [place] a copy of the tests  Patient does have baseline anxiety issues he uses Xanax for this a month prescription +5 refills given.  He must follow-up within 6 months for recheck  He denies being depressed  Has mild sinusitis antibiotics should help this

## 2014-04-16 ENCOUNTER — Encounter: Payer: Self-pay | Admitting: Family Medicine

## 2014-04-16 ENCOUNTER — Ambulatory Visit (INDEPENDENT_AMBULATORY_CARE_PROVIDER_SITE_OTHER): Payer: BLUE CROSS/BLUE SHIELD | Admitting: Family Medicine

## 2014-04-16 VITALS — BP 138/86 | Temp 99.0°F | Ht 69.0 in | Wt 242.0 lb

## 2014-04-16 DIAGNOSIS — L03315 Cellulitis of perineum: Secondary | ICD-10-CM | POA: Diagnosis not present

## 2014-04-16 MED ORDER — OXYCODONE-ACETAMINOPHEN 5-325 MG PO TABS: 1.0000 | ORAL_TABLET | ORAL | Status: DC | PRN

## 2014-04-16 MED ORDER — DOXYCYCLINE HYCLATE 100 MG PO CAPS: 100.0000 mg | ORAL_CAPSULE | Freq: Two times a day (BID) | ORAL | Status: AC

## 2014-04-16 MED ORDER — CEFTRIAXONE SODIUM 1 G IJ SOLR
1.0000 g | Freq: Once | INTRAMUSCULAR | Status: AC
  Administered 2014-04-16: 1 g via INTRAMUSCULAR

## 2014-04-16 NOTE — Patient Instructions (Signed)
Warm compresses 20 minutes every 4 hours  Take doxycycline with food and tall glass of water 2 times a day  Recheck weds at 11: 30 am  If high fever and worse call or go to ER     Tick Bite Information Ticks are insects that attach themselves to the skin and draw blood for food. There are various types of ticks. Common types include wood ticks and deer ticks. Most ticks live in shrubs and grassy areas. Ticks can climb onto your body when you make contact with leaves or grass where the tick is waiting. The most common places on the body for ticks to attach themselves are the scalp, neck, armpits, waist, and groin. Most tick bites are harmless, but sometimes ticks carry germs that cause diseases. These germs can be spread to a person during the tick's feeding process. The chance of a disease spreading through a tick bite depends on:   The type of tick.  Time of year.   How long the tick is attached.   Geographic location.  HOW CAN YOU PREVENT TICK BITES? Take these steps to help prevent tick bites when you are outdoors:  Wear protective clothing. Long sleeves and long pants are best.   Wear white clothes so you can see ticks more easily.  Tuck your pant legs into your socks.   If walking on a trail, stay in the middle of the trail to avoid brushing against bushes.  Avoid walking through areas with long grass.  Put insect repellent on all exposed skin and along boot tops, pant legs, and sleeve cuffs.   Check clothing, hair, and skin repeatedly and before going inside.   Brush off any ticks that are not attached.  Take a shower or bath as soon as possible after being outdoors.  WHAT IS THE PROPER WAY TO REMOVE A TICK? Ticks should be removed as soon as possible to help prevent diseases caused by tick bites. 1. If latex gloves are available, put them on before trying to remove a tick.  2. Using fine-point tweezers, grasp the tick as close to the skin as possible.  You may also use curved forceps or a tick removal tool. Grasp the tick as close to its head as possible. Avoid grasping the tick on its body. 3. Pull gently with steady upward pressure until the tick lets go. Do not twist the tick or jerk it suddenly. This may break off the tick's head or mouth parts. 4. Do not squeeze or crush the tick's body. This could force disease-carrying fluids from the tick into your body.  5. After the tick is removed, wash the bite area and your hands with soap and water or other disinfectant such as alcohol. 6. Apply a small amount of antiseptic cream or ointment to the bite site.  7. Wash and disinfect any instruments that were used.  Do not try to remove a tick by applying a hot match, petroleum jelly, or fingernail polish to the tick. These methods do not work and may increase the chances of disease being spread from the tick bite.  WHEN SHOULD YOU SEEK MEDICAL CARE? Contact your health care provider if you are unable to remove a tick from your skin or if a part of the tick breaks off and is stuck in the skin.  After a tick bite, you need to be aware of signs and symptoms that could be related to diseases spread by ticks. Contact your health care provider if  you develop any of the following in the days or weeks after the tick bite:  Unexplained fever.  Rash. A circular rash that appears days or weeks after the tick bite may indicate the possibility of Lyme disease. The rash may resemble a target with a bull's-eye and may occur at a different part of your body than the tick bite.  Redness and swelling in the area of the tick bite.   Tender, swollen lymph glands.   Diarrhea.   Weight loss.   Cough.   Fatigue.   Muscle, joint, or bone pain.   Abdominal pain.   Headache.   Lethargy or a change in your level of consciousness.  Difficulty walking or moving your legs.   Numbness in the legs.   Paralysis.  Shortness of breath.    Confusion.   Repeated vomiting.  Document Released: 01/17/2000 Document Revised: 11/09/2012 Document Reviewed: 06/29/2012 [facility] Patient Information 2015 [place], [place]. This information is not intended to replace advice given to you by your health care provider. Make sure you discuss any questions you have with your health care provider.

## 2014-04-16 NOTE — Progress Notes (Signed)
   Subjective:    Patient ID: Logan Alvarez, male    DOB: 1956-01-02, 59 y.o.   MRN: [id number]  HPI Patient arrives with complaint of swollen tick bite in private area with swelling in private area. Patient states tick bite occurred a couple days ago then occurred with redness swelling over the past 48 hours. He is able to urinate. No vomiting or diarrhea. Ramal low but a fever last night.  Review of Systems    patient states he is able to urinate. Denies abdominal pain. Ran some fever last night. No fever today. Objective:   Physical Exam Lungs are clear hearts regular pulse normal incredibly enlarged groin region no abscess has significant cellulitis. Does not appear to be gangrene.  Low-grade temperature currently     Assessment & Plan:  Significant cellulitis in the groin region. No abscess noted. Has a lot of swelling in that area. doxycycline can twice a day 14 days. Recommend follow-up in 48 hours to recheck

## 2014-04-17 ENCOUNTER — Telehealth: Payer: Self-pay | Admitting: Family Medicine

## 2014-04-17 MED ORDER — HYDROXYZINE HCL 25 MG PO TABS: 25.0000 mg | ORAL_TABLET | Freq: Four times a day (QID) | ORAL | Status: DC | PRN

## 2014-04-17 NOTE — Telephone Encounter (Signed)
Med sent. Pt notified and verbalized understanding. I told him to not drive while taking the oxycodone or the hydroxyzine.

## 2014-04-17 NOTE — Telephone Encounter (Signed)
Hydroxyzine 25 mg, #40, 2 refills, one every 4 hours when necessary itching caution drowsiness

## 2014-04-17 NOTE — Telephone Encounter (Signed)
Pt is experiencing itching at the tick bite site and is requesting something for it  Or a recommendation on something otc.

## 2014-04-18 ENCOUNTER — Encounter: Payer: Self-pay | Admitting: Family Medicine

## 2014-04-18 ENCOUNTER — Ambulatory Visit (INDEPENDENT_AMBULATORY_CARE_PROVIDER_SITE_OTHER): Payer: BLUE CROSS/BLUE SHIELD | Admitting: Family Medicine

## 2014-04-18 VITALS — Ht 69.0 in | Wt 242.0 lb

## 2014-04-18 DIAGNOSIS — L03315 Cellulitis of perineum: Secondary | ICD-10-CM

## 2014-04-18 LAB — CBC WITH DIFFERENTIAL/PLATELET
Basophils Absolute: 0 10*3/uL (ref 0.0–0.2)
Basos: 0 %
EOS: 4 %
Eosinophils Absolute: 0.4 10*3/uL (ref 0.0–0.4)
HCT: 42.4 % (ref 37.5–51.0)
Hemoglobin: 14.5 g/dL (ref 12.6–17.7)
IMMATURE GRANULOCYTES: 0 %
Immature Grans (Abs): 0 10*3/uL (ref 0.0–0.1)
LYMPHS: 19 %
Lymphocytes Absolute: 2 10*3/uL (ref 0.7–3.1)
MCH: 31.3 pg (ref 26.6–33.0)
MCHC: 34.2 g/dL (ref 31.5–35.7)
MCV: 91 fL (ref 79–97)
MONOCYTES: 9 %
Monocytes Absolute: 1 10*3/uL — ABNORMAL HIGH (ref 0.1–0.9)
NEUTROS PCT: 68 %
Neutrophils Absolute: 7.2 10*3/uL — ABNORMAL HIGH (ref 1.4–7.0)
PLATELETS: 303 10*3/uL (ref 150–379)
RBC: 4.64 x10E6/uL (ref 4.14–5.80)
RDW: 13.5 % (ref 12.3–15.4)
WBC: 10.6 10*3/uL (ref 3.4–10.8)

## 2014-04-18 LAB — BASIC METABOLIC PANEL
BUN / CREAT RATIO: 7 — AB (ref 9–20)
BUN: 6 mg/dL (ref 6–24)
CO2: 21 mmol/L (ref 18–29)
Calcium: 9.3 mg/dL (ref 8.7–10.2)
Chloride: 102 mmol/L (ref 97–108)
Creatinine, Ser: 0.92 mg/dL (ref 0.76–1.27)
GFR calc Af Amer: 105 mL/min/{1.73_m2} (ref >59)
GFR calc non Af Amer: 91 mL/min/{1.73_m2} (ref >59)
Glucose: 101 mg/dL — ABNORMAL HIGH (ref 65–99)
Potassium: 4.5 mmol/L (ref 3.5–5.2)
Sodium: 141 mmol/L (ref 134–144)

## 2014-04-18 NOTE — Progress Notes (Signed)
   Subjective:    Patient ID: Logan Alvarez, male    DOB: 1955-08-28, 59 y.o.   MRN: [id number]  HPI  Patient arrives for follow up on tick bite and swelling in private area. His CBC and metabolic 7 are reassuring No high fevers Some soreness and pain but not as bad He states the swelling is gone down some since started on antibiotics couple days ago Patient was on Cipro for a prostate infection around the time that the tick bite occurred. Review of Systems     Objective:   Physical Exam  Abdomen is soft there is swelling thickness in the skin in the perineum region in the groin region no abscess noted. I find no evidence of gangrene.      Assessment & Plan:  Cellulitis in the private region. I find no evidence of abscess. He was bit by a tick but I cannot rule out the possibility that there is a gram-negative present I would recommend Cipro twice a day for the next 7 days I also recommend continuing the doxycycline for a full 2 weeks. Patient is to give [place] an update in 1 week's time how he is doing. He is very swollen in that area very uncomfortable and may keep him out of work the next several days. Warm compresses frequently.

## 2014-04-18 NOTE — Patient Instructions (Signed)
cipro for 1 week  Use the doxycycline 2 times a day for 2 weeks  Call me in 1 week with update  Also if fevers ,severe swelling or worse call/ may have to go to ER if worse

## 2014-06-03 ENCOUNTER — Telehealth: Payer: Self-pay | Admitting: Family Medicine

## 2014-06-03 NOTE — Telephone Encounter (Signed)
Patient submitted lab work this is scanned into the cystoma letter was sent to the patient keep follow-up in September

## 2014-07-28 ENCOUNTER — Other Ambulatory Visit: Payer: Self-pay | Admitting: Family Medicine

## 2014-10-05 ENCOUNTER — Ambulatory Visit: Payer: BLUE CROSS/BLUE SHIELD | Admitting: Family Medicine

## 2014-10-26 ENCOUNTER — Other Ambulatory Visit: Payer: Self-pay | Admitting: Family Medicine

## 2014-10-29 ENCOUNTER — Ambulatory Visit (INDEPENDENT_AMBULATORY_CARE_PROVIDER_SITE_OTHER): Payer: BLUE CROSS/BLUE SHIELD | Admitting: Family Medicine

## 2014-10-29 ENCOUNTER — Encounter: Payer: Self-pay | Admitting: Family Medicine

## 2014-10-29 VITALS — BP 140/86 | Temp 98.1°F | Ht 69.0 in | Wt 240.2 lb

## 2014-10-29 DIAGNOSIS — S20212A Contusion of left front wall of thorax, initial encounter: Secondary | ICD-10-CM | POA: Diagnosis not present

## 2014-10-29 MED ORDER — ALPRAZOLAM 0.5 MG PO TABS: ORAL_TABLET | ORAL | Status: DC

## 2014-10-29 MED ORDER — HYDROCODONE-ACETAMINOPHEN 10-325 MG PO TABS: 1.0000 | ORAL_TABLET | ORAL | Status: DC | PRN

## 2014-10-29 NOTE — Progress Notes (Signed)
   Subjective:    Patient ID: Logan Alvarez, male    DOB: 11-23-1955, 59 y.o.   MRN: [id number]  Fall The accident occurred more than 1 week ago. The fall occurred while walking. There was no blood loss. Point of impact: Left side of chest. Pain location: Left side rib pain. The pain is at a severity of 7/10. The pain is moderate. The symptoms are aggravated by sitting (Laying down). Treatments tried: Advil.   Patient states he was walking up the hill from where his shed was he tripped on a railroad tie and fell straight forward striking his right rib cage he states the pain is worse when he lays down better when he sits up denies any problems with this before Patient states no other concerns this visit.  Review of Systems He denies shortness of breath vomiting diarrhea or rectal bleeding denies fever chills relate chest wall pain left side    Objective:   Physical Exam Neck no masses lungs are clear no crackles heart is regular tenderness in the left lower ribs worse on the lateral aspect. Abdomen is soft there is no guarding or rebound. Specifically pressure was placed in the direction of the liver and spleen without tenderness  Extremities no edema     Assessment & Plan:  Significant rib contusion should take somewhere between the next 2-4 weeks for this to gradually get better possibility of a fractured rib more than likely injury of the cartilage should gradually get better with time I doubt spleen injury if starts having vomiting bloody stools increased pain or worse follow-up a need scans I don't feel x-ray indicated currently. I find no evidence of lung collapse. Prescription for hydrocodone given caution drowsiness use sparingly only at home

## 2014-10-29 NOTE — Patient Instructions (Signed)
Caution drowsiness with the hydrocodone  If increased pain or vomiting then call  If not much better over the next week then call

## 2014-11-02 ENCOUNTER — Other Ambulatory Visit: Payer: Self-pay | Admitting: Family Medicine

## 2014-11-02 NOTE — Telephone Encounter (Signed)
May have this then 2 refills

## 2014-11-14 ENCOUNTER — Telehealth: Payer: Self-pay | Admitting: Family Medicine

## 2014-11-14 NOTE — Telephone Encounter (Signed)
Pt is requesting a refill on his hydrocodone 10-325 mg

## 2014-11-14 NOTE — Telephone Encounter (Signed)
May have refill on hydrocodone give prescription on his medicine. If persistent need for this will need follow-up.

## 2014-11-15 MED ORDER — HYDROCODONE-ACETAMINOPHEN 10-325 MG PO TABS: 1.0000 | ORAL_TABLET | ORAL | Status: DC | PRN

## 2014-11-15 NOTE — Telephone Encounter (Signed)
Left message notifying patient that script is ready for pickup and if persist needs follow up.

## 2015-05-03 ENCOUNTER — Other Ambulatory Visit: Payer: Self-pay | Admitting: Family Medicine

## 2015-05-03 NOTE — Telephone Encounter (Signed)
Fairly hi dose, not seen half a yr, thirty days only, o v with [place] before finished

## 2015-05-24 ENCOUNTER — Emergency Department (HOSPITAL_COMMUNITY)
Admission: EM | Admit: 2015-05-24 | Discharge: 2015-05-25 | Disposition: A | Discharge status: Home / Self Care | Payer: BLUE CROSS/BLUE SHIELD | Attending: Emergency Medicine | Admitting: Emergency Medicine

## 2015-05-24 DIAGNOSIS — M25561 Pain in right knee: Secondary | ICD-10-CM | POA: Diagnosis not present

## 2015-05-24 DIAGNOSIS — Y999 Unspecified external cause status: Secondary | ICD-10-CM | POA: Insufficient documentation

## 2015-05-24 DIAGNOSIS — M79672 Pain in left foot: Secondary | ICD-10-CM | POA: Diagnosis not present

## 2015-05-24 DIAGNOSIS — S92001A Unspecified fracture of right calcaneus, initial encounter for closed fracture: Secondary | ICD-10-CM | POA: Diagnosis not present

## 2015-05-24 DIAGNOSIS — W1789XA Other fall from one level to another, initial encounter: Secondary | ICD-10-CM | POA: Diagnosis not present

## 2015-05-24 DIAGNOSIS — S92251A Displaced fracture of navicular [scaphoid] of right foot, initial encounter for closed fracture: Secondary | ICD-10-CM | POA: Insufficient documentation

## 2015-05-24 DIAGNOSIS — Y929 Unspecified place or not applicable: Secondary | ICD-10-CM | POA: Diagnosis not present

## 2015-05-24 DIAGNOSIS — I10 Essential (primary) hypertension: Secondary | ICD-10-CM | POA: Insufficient documentation

## 2015-05-24 DIAGNOSIS — M25562 Pain in left knee: Secondary | ICD-10-CM | POA: Diagnosis not present

## 2015-05-24 DIAGNOSIS — E785 Hyperlipidemia, unspecified: Secondary | ICD-10-CM | POA: Diagnosis not present

## 2015-05-24 DIAGNOSIS — M25572 Pain in left ankle and joints of left foot: Secondary | ICD-10-CM | POA: Insufficient documentation

## 2015-05-24 DIAGNOSIS — Y9389 Activity, other specified: Secondary | ICD-10-CM | POA: Insufficient documentation

## 2015-05-24 DIAGNOSIS — M25571 Pain in right ankle and joints of right foot: Secondary | ICD-10-CM | POA: Diagnosis present

## 2015-05-25 ENCOUNTER — Emergency Department (HOSPITAL_COMMUNITY): Payer: BLUE CROSS/BLUE SHIELD

## 2015-05-25 ENCOUNTER — Encounter (HOSPITAL_COMMUNITY): Payer: Self-pay | Admitting: *Deleted

## 2015-05-25 DIAGNOSIS — M79672 Pain in left foot: Secondary | ICD-10-CM | POA: Diagnosis not present

## 2015-05-25 DIAGNOSIS — S92001A Unspecified fracture of right calcaneus, initial encounter for closed fracture: Secondary | ICD-10-CM | POA: Diagnosis not present

## 2015-05-25 DIAGNOSIS — M25561 Pain in right knee: Secondary | ICD-10-CM | POA: Diagnosis not present

## 2015-05-25 DIAGNOSIS — M25562 Pain in left knee: Secondary | ICD-10-CM | POA: Diagnosis not present

## 2015-05-25 LAB — CBC WITH DIFFERENTIAL/PLATELET
Basophils Absolute: 0 10*3/uL (ref 0.0–0.1)
Basophils Relative: 0 %
Eosinophils Absolute: 0.2 10*3/uL (ref 0.0–0.7)
Eosinophils Relative: 2 %
HEMATOCRIT: 43.2 % (ref 39.0–52.0)
HEMOGLOBIN: 15.4 g/dL (ref 13.0–17.0)
LYMPHS ABS: 2.3 10*3/uL (ref 0.7–4.0)
LYMPHS PCT: 21 %
MCH: 32.4 pg (ref 26.0–34.0)
MCHC: 35.6 g/dL (ref 30.0–36.0)
MCV: 90.8 fL (ref 78.0–100.0)
MONO ABS: 1 10*3/uL (ref 0.1–1.0)
MONOS PCT: 9 %
NEUTROS ABS: 7.4 10*3/uL (ref 1.7–7.7)
NEUTROS PCT: 68 %
Platelets: 241 10*3/uL (ref 150–400)
RBC: 4.76 MIL/uL (ref 4.22–5.81)
RDW: 13.1 % (ref 11.5–15.5)
WBC: 10.9 10*3/uL — ABNORMAL HIGH (ref 4.0–10.5)

## 2015-05-25 LAB — BASIC METABOLIC PANEL
Anion gap: 13 (ref 5–15)
BUN: 10 mg/dL (ref 6–20)
CHLORIDE: 100 mmol/L — AB (ref 101–111)
CO2: 23 mmol/L (ref 22–32)
CREATININE: 0.92 mg/dL (ref 0.61–1.24)
Calcium: 9.2 mg/dL (ref 8.9–10.3)
GFR calc non Af Amer: >60 mL/min (ref >60)
Glucose, Bld: 107 mg/dL — ABNORMAL HIGH (ref 65–99)
Potassium: 3.5 mmol/L (ref 3.5–5.1)
Sodium: 136 mmol/L (ref 135–145)

## 2015-05-25 MED ORDER — HYDROMORPHONE HCL 1 MG/ML IJ SOLN
1.0000 mg | Freq: Once | INTRAMUSCULAR | Status: AC
  Administered 2015-05-25: 1 mg via INTRAVENOUS
  Filled 2015-05-25: qty 1

## 2015-05-25 MED ORDER — TETANUS-DIPHTH-ACELL PERTUSSIS 5-2.5-18.5 LF-MCG/0.5 IM SUSP
0.5000 mL | Freq: Once | INTRAMUSCULAR | Status: AC
  Administered 2015-05-25: 0.5 mL via INTRAMUSCULAR
  Filled 2015-05-25: qty 0.5

## 2015-05-25 MED ORDER — TETANUS-DIPHTH-ACELL PERTUSSIS 5-2.5-18.5 LF-MCG/0.5 IM SUSP
INTRAMUSCULAR | Status: AC
  Filled 2015-05-25: qty 0.5

## 2015-05-25 MED ORDER — OXYCODONE-ACETAMINOPHEN 5-325 MG PO TABS: 1.0000 | ORAL_TABLET | ORAL | Status: DC | PRN

## 2015-05-25 MED ORDER — MORPHINE SULFATE (PF) 4 MG/ML IV SOLN
4.0000 mg | Freq: Once | INTRAVENOUS | Status: AC
  Administered 2015-05-25: 4 mg via INTRAVENOUS

## 2015-05-25 MED ORDER — MORPHINE SULFATE (PF) 4 MG/ML IV SOLN
4.0000 mg | Freq: Once | INTRAVENOUS | Status: DC
  Filled 2015-05-25: qty 1

## 2015-05-25 MED ORDER — OXYCODONE-ACETAMINOPHEN 5-325 MG PO TABS
1.0000 | ORAL_TABLET | Freq: Once | ORAL | Status: AC
  Administered 2015-05-25: 1 via ORAL
  Filled 2015-05-25: qty 1

## 2015-05-25 MED ORDER — ONDANSETRON HCL 4 MG/2ML IJ SOLN
4.0000 mg | Freq: Once | INTRAMUSCULAR | Status: AC
  Administered 2015-05-25: 4 mg via INTRAVENOUS
  Filled 2015-05-25: qty 2

## 2015-05-25 MED ORDER — MORPHINE SULFATE (PF) 4 MG/ML IV SOLN
4.0000 mg | Freq: Once | INTRAVENOUS | Status: AC
  Administered 2015-05-25: 4 mg via INTRAVENOUS
  Filled 2015-05-25: qty 1

## 2015-05-25 NOTE — Discharge Instructions (Signed)
He broke the calcaneus bone of her foot as well as 2 other small bones. You need to remain nonweightbearing. Follow up with orthopedics on Monday as instructed.  Calcaneal Fracture Repair There are many different ways of treating fractures of the large irregular bone in the foot that makes up the heel of the foot (calcaneus). Calcaneal fractures can be treated with:   Immobilization--The fracture is casted as it is without changing the positions of the fracture involved.   Closed reduction--The bones are manipulated back into position without opening the site of the fracture using surgery.   Open reduction and internal fixation--The fracture site is opened and the bone pieces are fixed into place with some type of hardware (such as a screw).   Primary arthrodesis--The joint has enough damage that a procedure is done as the first treatment which will leave the joint permanently stiff. This will decrease function, however usually will leave the joint pain free. LET [facility] CARE PROVIDER KNOW ABOUT:  Any allergies you have.   All medicines you are taking, including vitamins, herbs, eye drops, creams, and over-the-counter medicines.   Previous problems you or members of your family have had with the use of anesthetics.  Any blood disorders you have.   Previous surgeries you have had.   Medical conditions you have.  RISKS AND COMPLICATIONS Generally, calcaneal fracture repair is a safe procedure. However, as with any procedure, complications can occur. Possible complications include:   Swelling of the foot and ankle.  Infection of the wound or bone.  Arthritis.  Chronic pain of the foot.  Nerve injury.  Blood clot in the legs or lungs. BEFORE THE PROCEDURE  Ask your health care provider about changing or stopping your regular medicines. You may need to stop taking certain medicines, such as aspirin or blood thinners, at least 1 week before the surgery.  X-rays and  any imaging studies are reviewed with your healthcare provider. The surgeon will advise you on the best surgical approach to repair your fracture.  Do not eat or drink anything for at least 8 hours before the surgery or as directed by your health care provider.   If you smoke, do not smoke for at least 2 weeks before the surgery.   Make plans to have someone drive you home after the procedure. Also arrange for someone to help you with activities during recovery.  PROCEDURE   You will be given medicine to help you relax (sedative). You will then be given medicine to make you sleep through the procedure (general anesthetic). These medicines will be given through an IV access tube that is put into one of your veins.   A nerve block or numbing medicine (local anesthetic) may also be used to keep you comfortable.  Once you are asleep, the foot will be cleaned and shaved if needed.  The surgeon may use a percutaneous or open technique for this surgery:  In the percutaneous approach, small cuts and pins are used to repair the fracture.  In the open technique, a cut is made along the outside of the foot and the bone pieces are placed back together with hardware. A drain may be left to collect fluid. It is removed 3-4 days after the procedure.  The surgeon then uses staples or stitches to close the incision or cuts. AFTER THE PROCEDURE  After surgery you will be taken to the recovery area where a nurse will watch and check your progress for 1-3 hours. Once  you are awake, stable, and taking fluids well, and if you do not have any other problems, you will be allowed to go home.  You will be given pain medicine if needed.  The IV access tube will be removed before you are discharged.   This information is not intended to replace advice given to you by your health care provider. Make sure you discuss any questions you have with your health care provider.   Document Released: 10/29/2004  Document Revised: 11/09/2012 Document Reviewed: 08/23/2012 Elsevier Interactive Patient Education [employer].

## 2015-05-25 NOTE — ED Provider Notes (Signed)
CSN: [id number]     Arrival date & time 05/24/15  2353 History   First MD Initiated Contact with Patient 05/25/15 0012     Chief Complaint  Patient presents with  . Ankle Pain     (Consider location/radiation/quality/duration/timing/severity/associated sxs/prior Treatment) HPI  This is a 60 year old male with a history of hypertension, hyperlipidemia who presents following a fall. Patient reports that he was standing on top of some man-made scaffolding when he stepped onto a board it was not secured. He fell approximately 6-7 feet and landed directly on his bilateral heels. He did not hit his head or lose consciousness. He lowered himself to the ground. He is reporting pain mostly in his right foot but also has pain in his left foot and knee. Denies any back pain. Current pain is 10 out of 10 and achy in nature. He has not taken anything for the pain. He does not take any blood thinners.  Past Medical History  Diagnosis Date  . Hypertension   . Hyperlipidemia   . Testosterone deficiency 2009  . Chest pain     a. 2008: Normal ETT;  b. 01/2013 CTA: Ca score of 715 (92%'ile), LAD 50p, D1 50-75, RCA 50p.   Past Surgical History  Procedure Laterality Date  . Left heart catheterization with coronary angiogram N/A 02/01/2013    Procedure: LEFT HEART CATHETERIZATION WITH CORONARY ANGIOGRAM;  Surgeon: Josue Hector, MD;  Location: [facility] CATH LAB;  Service: Cardiovascular;  Laterality: N/A;   Family History  Problem Relation Age of Onset  . Heart attack Father     60's   Social History  Substance Use Topics  . Smoking status: Never Smoker   . Smokeless tobacco: Never Used  . Alcohol Use: Yes     Comment: states a 12 pack since this afternoon    Review of Systems  Respiratory: Negative for shortness of breath.   Cardiovascular: Negative for chest pain.  Gastrointestinal: Negative for abdominal pain.  Musculoskeletal: Negative for back pain and neck pain.       Bilateral foot pain and  knee pain  Skin: Positive for wound.  Neurological: Negative for headaches.  All other systems reviewed and are negative.     Allergies  Lodine  Home Medications   Prior to Admission medications   Medication Sig Start Date End Date Taking? Authorizing Provider  ALPRAZolam (XANAX) 0.5 MG tablet TAKE 1/2 TO 1 TABLET THREE TIMES A DAY AS NEEDED FOR ANXIETY. 05/03/15  Yes Mikey Kirschner, MD  HYDROcodone-acetaminophen (NORCO) 10-325 MG tablet Take 1 tablet by mouth every 4 (four) hours as needed. 11/15/14  Yes Kathyrn Drown, MD  albuterol (PROVENTIL HFA;VENTOLIN HFA) 108 (90 BASE) MCG/ACT inhaler Inhale 2 puffs into the lungs every 6 (six) hours as needed for wheezing. Patient not taking: Reported on 10/29/2014 02/21/14   Kathyrn Drown, MD  aspirin 81 MG tablet Take 81 mg by mouth daily.    Historical Provider, MD  atorvastatin (LIPITOR) 40 MG tablet TAKE 1 TABLET DAILY 10/26/14   Kathyrn Drown, MD  hydrOXYzine (ATARAX/VISTARIL) 25 MG tablet Take 1 tablet (25 mg total) by mouth 4 (four) times daily as needed for itching. Caution: drowsiness 04/17/14   Kathyrn Drown, MD  oxyCODONE-acetaminophen (PERCOCET/ROXICET) 5-325 MG tablet Take 1-2 tablets by mouth every 4 (four) hours as needed for severe pain. 05/25/15   Merryl Hacker, MD  oxyCODONE-acetaminophen (PERCOCET/ROXICET) 5-325 MG tablet Take 1-2 tablets by mouth every 4 (four) hours  as needed for severe pain. 05/25/15   Merryl Hacker, MD   BP 129/95 mmHg  Pulse 91  Resp 16  Ht 5\' 9"  (1.753 m)  Wt 235 lb (106.595 kg)  BMI 34.69 kg/m2  SpO2 98% Physical Exam  Constitutional: He is oriented to person, place, and time. He appears well-developed and well-nourished. No distress.  ABCs intact  HENT:  Head: Normocephalic and atraumatic.  Cardiovascular: Normal rate, regular rhythm and normal heart sounds.   No murmur heard. Pulmonary/Chest: Effort normal and breath sounds normal. No respiratory distress. He has no wheezes.   Abdominal: Soft. Bowel sounds are normal. There is no tenderness. There is no rebound and no guarding.  Musculoskeletal:  Swelling noted over the right mid foot and ankle, palpable 2+ DP pulse, limited range of motion, sensation intact no proximal fibular tenderness Normal range of motion of the left ankle, no obvious deformities, no significant swelling, 2+ DP pulse, tenderness with range of motion of the left knee with abrasion noted over the superior aspect of the knee No midline thoracic or lumbar tenderness to palpation, step-off, or deformity  Neurological: He is alert and oriented to person, place, and time.  Skin: Skin is warm and dry.  Psychiatric: He has a normal mood and affect.  Nursing note and vitals reviewed.   ED Course  Procedures (including critical care time) Labs Review Labs Reviewed  CBC WITH DIFFERENTIAL/PLATELET - Abnormal; Notable for the following:    WBC 10.9 (*)    All other components within normal limits  BASIC METABOLIC PANEL - Abnormal; Notable for the following:    Chloride 100 (*)    Glucose, Bld 107 (*)    All other components within normal limits    Imaging Review Dg Ankle Complete Right  05/25/2015  CLINICAL DATA:  Right ankle pain after fall from a deck. EXAM: RIGHT ANKLE - COMPLETE 3+ VIEW COMPARISON:  None. FINDINGS: Multiple comminuted fractures demonstrated in the right calcaneus. See additional description of the right foot, same date. The ankle joint appears to be intact. Talar dome is intact. Mild hypertrophic degenerative changes in the ankle joint. Accessory os trigonum. Soft tissue swelling. IMPRESSION: Multiple comminuted fractures of the right calcaneus. Ankle joint appears intact. Electronically Signed   By: Lucienne Capers M.D.   On: 05/25/2015 02:08   Ct Foot Right Wo Contrast  05/25/2015  CLINICAL DATA:  Fall onto concrete with right foot fractures. EXAM: CT OF THE RIGHT FOOT WITHOUT CONTRAST TECHNIQUE: Multidetector CT imaging of  the right foot was performed according to the standard protocol. Multiplanar CT image reconstructions were also generated. COMPARISON:  Radiographs earlier this day. FINDINGS: Highly comminuted calcaneal fracture. Fracture extends into the middle and posterior subtalar joints with multiple small intra-articular fracture fragments. Dominant oblique fracture plane through the plantar aspect. There is mildly displaced involvement of the sustentaculum tali. Multiple small fracture fragments and sinus tarsi. Comminuted extension to the calcaneal cuboid joint. No definite tendon entrapment, however flexor hallices longus tendon abuts fracture planes about the plantar calcaneus. Oblique nondisplaced fracture through the medial navicular. Probable nondisplaced fracture of the lateral cuboid. Ankle mortise and ankle joint are intact. Metatarsals and digits are intact. Remote fracture of the fifth toe proximal phalanx. Diffuse soft tissue edema.  No tibiotalar joint effusion. IMPRESSION: 1. Highly comminuted intra-articular calcaneal fracture. 2. Nondisplaced fracture through the medial navicular. Probable nondisplaced fracture of the lateral cuboid. Electronically Signed   By: Jeb Levering M.D.   On: 05/25/2015  03:57   Dg Knee Complete 4 Views Left  05/25/2015  CLINICAL DATA:  Left knee pain after fall from a deck. EXAM: LEFT KNEE - COMPLETE 4+ VIEW COMPARISON:  None. FINDINGS: There is no evidence of fracture, dislocation, or joint effusion. There is no evidence of arthropathy or other focal bone abnormality. Soft tissues are unremarkable. IMPRESSION: Negative. Electronically Signed   By: Lucienne Capers M.D.   On: 05/25/2015 02:08   Dg Knee Complete 4 Views Right  05/25/2015  CLINICAL DATA:  Right knee pain after fall from a deck. EXAM: RIGHT KNEE - COMPLETE 4+ VIEW COMPARISON:  None. FINDINGS: There is no evidence of fracture, dislocation, or joint effusion. There is no evidence of arthropathy or other focal  bone abnormality. Soft tissues are unremarkable. IMPRESSION: Negative. Electronically Signed   By: Lucienne Capers M.D.   On: 05/25/2015 02:09   Dg Foot Complete Left  05/25/2015  CLINICAL DATA:  Left foot pain after fall from a deck. EXAM: LEFT FOOT - COMPLETE 3+ VIEW COMPARISON:  None. FINDINGS: There is no evidence of fracture or dislocation. There is no evidence of arthropathy or other focal bone abnormality. Soft tissues are unremarkable. Prominent calcaneal plantar spur. IMPRESSION: Negative. Electronically Signed   By: Lucienne Capers M.D.   On: 05/25/2015 02:11   Dg Foot Complete Right  05/25/2015  CLINICAL DATA:  Patient fell 7 faint with bruising and swelling to the medial on lateral side of the right foot. Bilateral foot pain. EXAM: RIGHT FOOT COMPLETE - 3+ VIEW COMPARISON:  None. FINDINGS: Comminuted fractures of the right calcaneus extending to the anterior, posterior, and subtalar margins. Fracture lines appear to extend to the calcaneal cuboid and talocalcaneal joints. Focal irregularity in the medial aspect of the navicular bone could represent additional nondisplaced fracture. Probable avulsion off of the lateral cuboidal bone. Diffuse soft tissue swelling. Degenerative changes in the ankle joint. IMPRESSION: Multiple comminuted fractures of the calcaneus with probable fractures of the medial navicular and lateral cuboid old bones. Electronically Signed   By: Lucienne Capers M.D.   On: 05/25/2015 02:07   I have personally reviewed and evaluated these images and lab results as part of my medical decision-making.   EKG Interpretation None      MDM   Final diagnoses:  Calcaneal fracture, right, closed, initial encounter  Navicular fracture, right, closed, initial encounter    Patient presents following a fall. Reports that he landed on his bilateral feet. Most of his pain is in his right foot. Denies back pain. He is nontoxic. Primary survey intact. Plain films obtained and  shows multiple comminuted fractures of the calcaneus on the right with navicular and cuboid bone fractures. He's neurovascularly intact. Other imaging is reassuring. Orthopedics consulted.  2:50 AM Discussed with Dr. Marlou Sa. He is requesting CT of the right foot. Patient will be placed in a well-padded posterior splint. Follow-up with Dr. Sharol Given on Monday.  CT obtained. Patient placed in a well-padded splint.  He was given crutches. Discharged with pain medication and follow-up on Monday as above.  After history, exam, and medical workup I feel the patient has been appropriately medically screened and is safe for discharge home. Pertinent diagnoses were discussed with the patient. Patient was given return precautions.   Merryl Hacker, MD 05/25/15 [id number]

## 2015-05-25 NOTE — ED Notes (Signed)
Pt states fell off a deck appx 7' landed on lower ext. Complaining of bilateral ankle & left knee pain.

## 2015-05-27 DIAGNOSIS — S92001D Unspecified fracture of right calcaneus, subsequent encounter for fracture with routine healing: Secondary | ICD-10-CM | POA: Diagnosis not present

## 2015-05-27 DIAGNOSIS — S92001A Unspecified fracture of right calcaneus, initial encounter for closed fracture: Secondary | ICD-10-CM | POA: Diagnosis not present

## 2015-05-29 MED FILL — Oxycodone w/ Acetaminophen Tab 5-325 MG: ORAL | Qty: 6 | Status: AC

## 2015-06-05 ENCOUNTER — Telehealth: Payer: Self-pay | Admitting: Family Medicine

## 2015-06-05 ENCOUNTER — Encounter: Payer: Self-pay | Admitting: Family Medicine

## 2015-06-05 NOTE — Telephone Encounter (Signed)
Erroneous entry

## 2015-06-10 DIAGNOSIS — S92001D Unspecified fracture of right calcaneus, subsequent encounter for fracture with routine healing: Secondary | ICD-10-CM | POA: Diagnosis not present

## 2015-07-08 ENCOUNTER — Other Ambulatory Visit: Payer: Self-pay | Admitting: Family Medicine

## 2015-07-08 DIAGNOSIS — S92001D Unspecified fracture of right calcaneus, subsequent encounter for fracture with routine healing: Secondary | ICD-10-CM | POA: Diagnosis not present

## 2015-07-08 NOTE — Telephone Encounter (Signed)
1 refill, needs office visit 

## 2015-07-24 DIAGNOSIS — S92001D Unspecified fracture of right calcaneus, subsequent encounter for fracture with routine healing: Secondary | ICD-10-CM | POA: Diagnosis not present

## 2015-07-25 DIAGNOSIS — M25674 Stiffness of right foot, not elsewhere classified: Secondary | ICD-10-CM | POA: Diagnosis not present

## 2015-07-25 DIAGNOSIS — M79671 Pain in right foot: Secondary | ICD-10-CM | POA: Diagnosis not present

## 2015-07-25 DIAGNOSIS — M25671 Stiffness of right ankle, not elsewhere classified: Secondary | ICD-10-CM | POA: Diagnosis not present

## 2015-07-25 DIAGNOSIS — M25571 Pain in right ankle and joints of right foot: Secondary | ICD-10-CM | POA: Diagnosis not present

## 2015-07-26 DIAGNOSIS — M79671 Pain in right foot: Secondary | ICD-10-CM | POA: Diagnosis not present

## 2015-07-26 DIAGNOSIS — M25674 Stiffness of right foot, not elsewhere classified: Secondary | ICD-10-CM | POA: Diagnosis not present

## 2015-07-26 DIAGNOSIS — M25571 Pain in right ankle and joints of right foot: Secondary | ICD-10-CM | POA: Diagnosis not present

## 2015-07-26 DIAGNOSIS — M25671 Stiffness of right ankle, not elsewhere classified: Secondary | ICD-10-CM | POA: Diagnosis not present

## 2015-07-29 DIAGNOSIS — M25571 Pain in right ankle and joints of right foot: Secondary | ICD-10-CM | POA: Diagnosis not present

## 2015-07-29 DIAGNOSIS — M25671 Stiffness of right ankle, not elsewhere classified: Secondary | ICD-10-CM | POA: Diagnosis not present

## 2015-07-29 DIAGNOSIS — M25674 Stiffness of right foot, not elsewhere classified: Secondary | ICD-10-CM | POA: Diagnosis not present

## 2015-07-29 DIAGNOSIS — M79671 Pain in right foot: Secondary | ICD-10-CM | POA: Diagnosis not present

## 2015-07-31 DIAGNOSIS — M25674 Stiffness of right foot, not elsewhere classified: Secondary | ICD-10-CM | POA: Diagnosis not present

## 2015-07-31 DIAGNOSIS — M25671 Stiffness of right ankle, not elsewhere classified: Secondary | ICD-10-CM | POA: Diagnosis not present

## 2015-07-31 DIAGNOSIS — M79671 Pain in right foot: Secondary | ICD-10-CM | POA: Diagnosis not present

## 2015-07-31 DIAGNOSIS — M25571 Pain in right ankle and joints of right foot: Secondary | ICD-10-CM | POA: Diagnosis not present

## 2015-08-02 DIAGNOSIS — M25671 Stiffness of right ankle, not elsewhere classified: Secondary | ICD-10-CM | POA: Diagnosis not present

## 2015-08-02 DIAGNOSIS — M25571 Pain in right ankle and joints of right foot: Secondary | ICD-10-CM | POA: Diagnosis not present

## 2015-08-02 DIAGNOSIS — M79671 Pain in right foot: Secondary | ICD-10-CM | POA: Diagnosis not present

## 2015-08-02 DIAGNOSIS — M25674 Stiffness of right foot, not elsewhere classified: Secondary | ICD-10-CM | POA: Diagnosis not present

## 2015-08-05 DIAGNOSIS — S92001D Unspecified fracture of right calcaneus, subsequent encounter for fracture with routine healing: Secondary | ICD-10-CM | POA: Diagnosis not present

## 2015-08-16 ENCOUNTER — Ambulatory Visit (INDEPENDENT_AMBULATORY_CARE_PROVIDER_SITE_OTHER): Payer: BLUE CROSS/BLUE SHIELD | Admitting: Family Medicine

## 2015-08-16 VITALS — BP 126/86 | Ht 69.0 in | Wt 226.2 lb

## 2015-08-16 DIAGNOSIS — E782 Mixed hyperlipidemia: Secondary | ICD-10-CM | POA: Diagnosis not present

## 2015-08-16 DIAGNOSIS — F419 Anxiety disorder, unspecified: Secondary | ICD-10-CM | POA: Diagnosis not present

## 2015-08-16 DIAGNOSIS — I10 Essential (primary) hypertension: Secondary | ICD-10-CM | POA: Diagnosis not present

## 2015-08-16 MED ORDER — ALPRAZOLAM 0.5 MG PO TABS: ORAL_TABLET | ORAL | Status: DC

## 2015-08-16 MED ORDER — PRAVASTATIN SODIUM 20 MG PO TABS: 20.0000 mg | ORAL_TABLET | Freq: Every day | ORAL | Status: DC

## 2015-08-16 NOTE — Patient Instructions (Signed)
Pravastatin- start 1 per day on M W Fri for 2 weeks Then 1 per day Then recheck labs 8 to 10 weeks Recheck here 6 month      Dear Patient,  It has been recommended to you that you have a colonoscopy. It is your responsibility to carry through with this recommendation.   Did you realize that colon cancer is the second leading cancer killer in the [place]. One in every 20 adults will get colon cancer. If all adults would go through the recommended screening for colon cancer (getting a colonoscopy), then there would be a 60% reduction in the number of people dying from colon cancer.  Colon cancer just doesn't come out of the blue. It starts off as a small polyp which over time grows into a cancer. A colonoscopy can prevent cancer and in many cases detected when it is at a very treatable phase. Small colon cancers can have cure rates of 95%. Advanced colon cancer, which often occurs in people who do not do their screenings, have cure rates less than 20%. The risk of colon cancer advances with age. Most adults should have regular colonoscopies every 10 years starting at age 94. This recommendation can vary depending on a person's medical history.  Health-care laws now allow for you to call the gastroenterologist office directly in order to set yourself up for this very important tests. Today we have recommended to you that you do this test. This test may save your life. Failure to do this test puts you at risk for premature death from colon cancer. Do the right thing and schedule this test now.  Here as a list of specialists we recommend in the surrounding area. When you call their office let them know that you are a patient of our practice in your interested in doing a screening colonoscopy. They should assist you without problems. You will need the following information when you called them: 1-name of which Dr. you see, 2-your insurance information, 3-a list of medications that you currently take,  4-any allergies you have to medications.  [place] gastroenterologist Dr. Milton Ferguson, Dr Felicie Morn gastroenterologist   [facility]   [phone]  [facility] gastroenterology (Dr. Garnetta Buddy and [place]) [phone]  [facility] gastroenterology (Dr. Leia Alf, Harrietta Guardian, [place]) [phone]  Each group of specialists has assured [place] that when you called them they will help you get your colonoscopy set up. Should you have problems or if the GI practice insist a referral be done please let us know. Be sure to call soon. Sincerely, Pearson Forster, Dr Mickie Hillier, Dr.Kendrew Paci   Aspirin and Your Heart  Aspirin is a medicine that affects the way blood clots. Aspirin can be used to help reduce the risk of blood clots, heart attacks, and other heart-related problems.  SHOULD I TAKE ASPIRIN? Your health care provider will help you determine whether it is safe and beneficial for you to take aspirin daily. Taking aspirin daily may be beneficial if you:  Have had a heart attack or chest pain.  Have undergone open heart surgery such as coronary artery bypass surgery (CABG).  Have had coronary angioplasty.  Have experienced a stroke or transient ischemic attack (TIA).  Have peripheral vascular disease (PVD).  Have chronic heart rhythm problems such as atrial fibrillation. ARE THERE ANY RISKS OF TAKING ASPIRIN DAILY? Daily use of aspirin can increase your risk of  side effects. Some of these include:  Bleeding. Bleeding problems can be minor or serious. An example of a minor problem is a cut that does not stop bleeding. An example of a more serious problem is stomach bleeding or bleeding into the brain. Your risk of bleeding is increased if you are also taking non-steroidal anti-inflammatory medicine (NSAIDs).  Increased bruising.  Upset stomach.  An  allergic reaction. People who have nasal polyps have an increased risk of developing an aspirin allergy. WHAT ARE SOME GUIDELINES I SHOULD FOLLOW WHEN TAKING ASPIRIN?   Take aspirin only as directed by your health care provider. Make sure you understand how much you should take and what form you should take. The two forms of aspirin are:  Non-enteric-coated. This type of aspirin does not have a coating and is absorbed quickly. Non-enteric-coated aspirin is usually recommended for people with chest pain. This type of aspirin also comes in a chewable form.  Enteric-coated. This type of aspirin has a special coating that releases the medicine very slowly. Enteric-coated aspirin causes less stomach upset than non-enteric-coated aspirin. This type of aspirin should not be chewed or crushed.  Drink alcohol in moderation. Drinking alcohol increases your risk of bleeding. WHEN SHOULD I SEEK MEDICAL CARE?   You have unusual bleeding or bruising.  You have stomach pain.  You have an allergic reaction. Symptoms of an allergic reaction include:  Hives.  Itchy skin.  Swelling of the lips, tongue, or face.  You have ringing in your ears. WHEN SHOULD I SEEK IMMEDIATE MEDICAL CARE?   Your bowel movements are bloody, dark red, or black in color.  You vomit or cough up blood.  You have blood in your urine.  You cough, wheeze, or feel short of breath. If you have any of the following symptoms, this is an emergency. Do not wait to see if the pain will go away. Get medical help at once. Call your local emergency services (911 in the [place] Do not drive yourself to the hospital.  You have severe chest pain, especially if the pain is crushing or pressure-like and spreads to the arms, back, neck, or jaw.  You have stroke-like symptoms, such as:   Loss of vision.   Difficulty talking.   Numbness or weakness on one side of your body.   Numbness or weakness in your arm or leg.   Not  thinking clearly or feeling confused.    This information is not intended to replace advice given to you by your health care provider. Make sure you discuss any questions you have with your health care provider.   Document Released: 01/02/2008 Document Revised: 02/09/2014 Document Reviewed: 04/26/2013 Elsevier Interactive Patient Education [employer].

## 2015-08-16 NOTE — Progress Notes (Signed)
   Subjective:    Patient ID: Logan Alvarez, male    DOB: 27-Mar-1955, 60 y.o.   MRN: [id number]  Hypertension This is a chronic problem. The current episode started more than 1 year ago. Pertinent negatives include no chest pain, headaches or shortness of breath. There are no compliance problems.    Patient has concerns of hyperlipidemia.  Patient's cholesterol mildly elevated did not tolerate Lipitor. Is willing to try different medication We went over his risk of heart disease being close to 10% which is unacceptable He has not taken his aspirin any more does not cause any stomach trouble we talked about risk and benefits of recommended to restart it We talked about colonoscopy and the importance of this for his health We also talked about slight elevation of his bilirubin unlikely to be any sign of any serious trouble the that we will monitor this He does relate chronic anxiety issues he takes Xanax 3 times a day and it helps him function he's done this for years he denies a causing drowsiness He denies being depressed Had a recent heel fracture but he states he is getting much better  Review of Systems  Constitutional: Negative for fever, activity change and fatigue.  Respiratory: Negative for cough and shortness of breath.   Cardiovascular: Negative for chest pain and leg swelling.  Neurological: Negative for headaches.       Objective:   Physical Exam  Constitutional: He appears well-nourished. No distress.  Cardiovascular: Normal rate, regular rhythm and normal heart sounds.   No murmur heard. Pulmonary/Chest: Effort normal and breath sounds normal. No respiratory distress.  Musculoskeletal: He exhibits no edema.  Lymphadenopathy:    He has no cervical adenopathy.  Neurological: He is alert.  Psychiatric: His behavior is normal.  Vitals reviewed.         Assessment & Plan:  Hypertension good control continue current measures Hyperlipidemia recent lab work shows  increased risk for heart disease patient did not tolerate Lipitor we will try pravastatin start initially Monday Wednesday Friday after 2 weeks go to one every day check labs in approximately 8 weeks follow-up based on the results  Recheck in 6 months  Increased risk of heart disease catheterization from several years ago reviewed with the patient 81 mg aspirin recommended Chronic anxiety Xanax he takes it typically 3 times a day denies abusing it Patient does state he drinks occasional beer I told him to a limited to no more than 2 per day plus also never today take his medication and drink at the same time plus also never Drive with drinking Colonoscopy recommended. 25 minutes was spent with the patient. Greater than half the time was spent in discussion and answering questions and counseling regarding the issues that the patient came in for today.

## 2015-08-28 DIAGNOSIS — M79671 Pain in right foot: Secondary | ICD-10-CM | POA: Diagnosis not present

## 2015-08-28 DIAGNOSIS — M25671 Stiffness of right ankle, not elsewhere classified: Secondary | ICD-10-CM | POA: Diagnosis not present

## 2015-08-28 DIAGNOSIS — M25571 Pain in right ankle and joints of right foot: Secondary | ICD-10-CM | POA: Diagnosis not present

## 2015-08-28 DIAGNOSIS — M25674 Stiffness of right foot, not elsewhere classified: Secondary | ICD-10-CM | POA: Diagnosis not present

## 2015-09-02 DIAGNOSIS — M79671 Pain in right foot: Secondary | ICD-10-CM | POA: Diagnosis not present

## 2015-09-02 DIAGNOSIS — M25674 Stiffness of right foot, not elsewhere classified: Secondary | ICD-10-CM | POA: Diagnosis not present

## 2015-09-02 DIAGNOSIS — M25671 Stiffness of right ankle, not elsewhere classified: Secondary | ICD-10-CM | POA: Diagnosis not present

## 2015-09-02 DIAGNOSIS — M25571 Pain in right ankle and joints of right foot: Secondary | ICD-10-CM | POA: Diagnosis not present

## 2015-09-05 DIAGNOSIS — M79671 Pain in right foot: Secondary | ICD-10-CM | POA: Diagnosis not present

## 2015-09-05 DIAGNOSIS — M25671 Stiffness of right ankle, not elsewhere classified: Secondary | ICD-10-CM | POA: Diagnosis not present

## 2015-09-05 DIAGNOSIS — M25571 Pain in right ankle and joints of right foot: Secondary | ICD-10-CM | POA: Diagnosis not present

## 2015-09-05 DIAGNOSIS — M25674 Stiffness of right foot, not elsewhere classified: Secondary | ICD-10-CM | POA: Diagnosis not present

## 2015-09-09 DIAGNOSIS — M79671 Pain in right foot: Secondary | ICD-10-CM | POA: Diagnosis not present

## 2015-09-09 DIAGNOSIS — M25674 Stiffness of right foot, not elsewhere classified: Secondary | ICD-10-CM | POA: Diagnosis not present

## 2015-09-09 DIAGNOSIS — M25571 Pain in right ankle and joints of right foot: Secondary | ICD-10-CM | POA: Diagnosis not present

## 2015-09-09 DIAGNOSIS — M25671 Stiffness of right ankle, not elsewhere classified: Secondary | ICD-10-CM | POA: Diagnosis not present

## 2015-09-11 DIAGNOSIS — M25571 Pain in right ankle and joints of right foot: Secondary | ICD-10-CM | POA: Diagnosis not present

## 2015-09-11 DIAGNOSIS — M25671 Stiffness of right ankle, not elsewhere classified: Secondary | ICD-10-CM | POA: Diagnosis not present

## 2015-09-11 DIAGNOSIS — M79671 Pain in right foot: Secondary | ICD-10-CM | POA: Diagnosis not present

## 2015-09-11 DIAGNOSIS — M25674 Stiffness of right foot, not elsewhere classified: Secondary | ICD-10-CM | POA: Diagnosis not present

## 2016-01-06 ENCOUNTER — Other Ambulatory Visit: Payer: Self-pay | Admitting: Family Medicine

## 2016-01-06 NOTE — Telephone Encounter (Signed)
Patient may have this plus one additional refill on both prescriptions follow-up within 60 days

## 2016-03-06 ENCOUNTER — Other Ambulatory Visit: Payer: Self-pay | Admitting: Family Medicine

## 2016-04-03 ENCOUNTER — Other Ambulatory Visit: Payer: Self-pay | Admitting: Family Medicine

## 2016-04-03 NOTE — Telephone Encounter (Signed)
Last seen 08/16/15

## 2016-04-05 NOTE — Telephone Encounter (Signed)
Patient may have one refill needs office visit

## 2016-05-06 ENCOUNTER — Other Ambulatory Visit: Payer: Self-pay | Admitting: Family Medicine

## 2016-05-06 NOTE — Telephone Encounter (Signed)
Last seen July 2017

## 2016-05-07 ENCOUNTER — Other Ambulatory Visit: Payer: Self-pay | Admitting: Family Medicine

## 2016-05-07 NOTE — Telephone Encounter (Signed)
He can have 15 tablets needs office visit

## 2016-05-11 ENCOUNTER — Telehealth: Payer: Self-pay | Admitting: Family Medicine

## 2016-05-11 MED ORDER — ALPRAZOLAM 0.5 MG PO TABS: ORAL_TABLET | ORAL | 0 refills | Status: DC

## 2016-05-11 NOTE — Telephone Encounter (Signed)
Prescription faxed to pharmacy. Patient notified. 

## 2016-05-11 NOTE — Telephone Encounter (Signed)
patient was cut back to #15 and given script 05/07/16

## 2016-05-11 NOTE — Telephone Encounter (Signed)
Patient is requesting Rx for Alprazolam.  He has an appointment for 05/29/16 with Dr. Nicki Reaper.  Larene Pickett

## 2016-05-11 NOTE — Telephone Encounter (Signed)
The patient may have a prescription enough to allow him to have 3 per day for the next 3 weeks then when we see him we can prescribed 30 day prescription

## 2016-05-28 ENCOUNTER — Telehealth: Payer: Self-pay | Admitting: Family Medicine

## 2016-05-28 NOTE — Telephone Encounter (Signed)
Review blood work results faxed over from [facility].

## 2016-05-28 NOTE — Telephone Encounter (Signed)
Patients creatinine 0.73 total bilirubin 1.8 liver enzyme G OT GPT normal GGT slightly elevated triglycerides slightly elevated at 214 total cholesterol 180 LDL 94 hemoglobin white blood count looks good prostate PSA looks good. Patient has wellness exam on Friday the 27th

## 2016-05-29 ENCOUNTER — Ambulatory Visit (INDEPENDENT_AMBULATORY_CARE_PROVIDER_SITE_OTHER): Payer: BLUE CROSS/BLUE SHIELD | Admitting: Family Medicine

## 2016-05-29 ENCOUNTER — Encounter: Payer: Self-pay | Admitting: Family Medicine

## 2016-05-29 VITALS — BP 148/90 | Ht 69.0 in | Wt 233.1 lb

## 2016-05-29 DIAGNOSIS — F419 Anxiety disorder, unspecified: Secondary | ICD-10-CM

## 2016-05-29 DIAGNOSIS — E782 Mixed hyperlipidemia: Secondary | ICD-10-CM | POA: Diagnosis not present

## 2016-05-29 DIAGNOSIS — Z0000 Encounter for general adult medical examination without abnormal findings: Secondary | ICD-10-CM | POA: Diagnosis not present

## 2016-05-29 DIAGNOSIS — M25551 Pain in right hip: Secondary | ICD-10-CM | POA: Diagnosis not present

## 2016-05-29 DIAGNOSIS — Z1211 Encounter for screening for malignant neoplasm of colon: Secondary | ICD-10-CM

## 2016-05-29 DIAGNOSIS — M79604 Pain in right leg: Secondary | ICD-10-CM | POA: Diagnosis not present

## 2016-05-29 DIAGNOSIS — R740 Nonspecific elevation of levels of transaminase and lactic acid dehydrogenase [LDH]: Secondary | ICD-10-CM | POA: Diagnosis not present

## 2016-05-29 DIAGNOSIS — R7401 Elevation of levels of liver transaminase levels: Secondary | ICD-10-CM

## 2016-05-29 MED ORDER — FLUTICASONE PROPIONATE 50 MCG/ACT NA SUSP: 2.0000 | Freq: Every day | NASAL | 6 refills | Status: DC

## 2016-05-29 MED ORDER — DULOXETINE HCL 30 MG PO CPEP: 30.0000 mg | ORAL_CAPSULE | Freq: Every day | ORAL | 4 refills | Status: DC

## 2016-05-29 MED ORDER — PRAVASTATIN SODIUM 20 MG PO TABS: 20.0000 mg | ORAL_TABLET | Freq: Every day | ORAL | 5 refills | Status: DC

## 2016-05-29 MED ORDER — ALPRAZOLAM 0.5 MG PO TABS: ORAL_TABLET | ORAL | 5 refills | Status: DC

## 2016-05-29 NOTE — Progress Notes (Signed)
Subjective:    Patient ID: Logan Alvarez, male    DOB: 10-25-1955, 61 y.o.   MRN: [id number]  HPI The patient comes in today for a wellness visit.    A review of their health history was completed.  A review of medications was also completed.  Any needed refills: pravastatin and alprazolam   Eating habits:  good  Falls/MVA accidents in past few months: none  Regular exercise: none  Specialist pt sees on regular basis: none  Preventative health issues were discussed.  I did discuss depression with him. He at times having mild depression although at other times not bad. Does have a fair amount of stress issues. Some loneliness from the death of his wife several years ago. Not suicidal. He is open to going ahead and starting a medication Chronic right foot pain from a previous fall accident causes pain and discomfort he would like to try some Cipro will help he uses over-the-counter anti-inflammatories History of elevated liver enzymes in the past she is worried about his liver I reviewed over his lab work slight elevation 1 enzyme otherwise looks good he is moderately overweight I counseled him regarding cutting back on alcohol and also on exercise and losing weight He does need his colonoscopy he is open to getting it done He is having right hip pain and discomfort with movement in the morning but seems to do better as a day goes on he has limited range of motion of the right hip on physical exam  Additional concerns: ears ringing, sinus issues, hip pain   Last colonoscopy was 11 years ago.  Review of Systems  Constitutional: Negative for activity change, appetite change and fever.  HENT: Negative for congestion and rhinorrhea.   Eyes: Negative for discharge.  Respiratory: Negative for cough and wheezing.   Cardiovascular: Negative for chest pain.  Gastrointestinal: Negative for abdominal pain, blood in stool and vomiting.  Genitourinary: Negative for difficulty urinating and  frequency.  Musculoskeletal: Negative for neck pain.  Skin: Negative for rash.  Allergic/Immunologic: Negative for environmental allergies and food allergies.  Neurological: Negative for weakness and headaches.  Psychiatric/Behavioral: Negative for agitation.       Objective:   Physical Exam  Constitutional: He appears well-developed and well-nourished.  HENT:  Head: Normocephalic and atraumatic.  Right Ear: External ear normal.  Left Ear: External ear normal.  Nose: Nose normal.  Mouth/Throat: Oropharynx is clear and moist.  Eyes: EOM are normal. Pupils are equal, round, and reactive to light.  Neck: Normal range of motion. Neck supple. No thyromegaly present.  Cardiovascular: Normal rate, regular rhythm and normal heart sounds.   No murmur heard. Pulmonary/Chest: Effort normal and breath sounds normal. No respiratory distress. He has no wheezes.  Abdominal: Soft. Bowel sounds are normal. He exhibits no distension and no mass. There is no tenderness.  Genitourinary: Penis normal.  Musculoskeletal: Normal range of motion. He exhibits no edema.  Lymphadenopathy:    He has no cervical adenopathy.  Neurological: He is alert. He exhibits normal muscle tone.  Skin: Skin is warm and dry. No erythema.  Psychiatric: He has a normal mood and affect. His behavior is normal. Judgment normal.   Prostate exam normal slightly enlarged Patient not suicidal Limited movement of the right hip on physical exam     Assessment & Plan:  Adult wellness-complete.wellness physical was conducted today. Importance of diet and exercise were discussed in detail. In addition to this a discussion regarding safety was also  covered. We also reviewed over immunizations and gave recommendations regarding current immunization needed for age. In addition to this additional areas were also touched on including: Preventative health exams needed: Colonoscopy being referred to gastroenterology  Patient was advised  yearly wellness exam  Right hip pain related to the fall. No x-rays indicated currently. If it progressively gets worse x-rays orthopedic referral may use OTC anti-inflammatories  Hyperlipidemia previous labs look good liver functions overall look good slight elevation in GGT. Will monitor.  Anxiety with some depression issues continue Xanax as prescribed follow-up in 6 months start Cymbalta 30 mg daily side effects discussed if problems stop medicine otherwise should help depression and his chronic pain with his right foot from previous fall patient will follow-up in 3 months and then regular checkup in 6 months  Minimal elevation of liver enzyme I'm not worried about this but we will monitor repeat liver profile in 6-8 weeks  Patient denies abusing the Xanax states it helps him function is been on it for years. Does not cause drowsiness

## 2016-06-14 ENCOUNTER — Encounter: Payer: Self-pay | Admitting: Family Medicine

## 2016-07-06 ENCOUNTER — Ambulatory Visit (INDEPENDENT_AMBULATORY_CARE_PROVIDER_SITE_OTHER): Payer: BLUE CROSS/BLUE SHIELD | Admitting: Nurse Practitioner

## 2016-07-06 ENCOUNTER — Encounter: Payer: Self-pay | Admitting: Nurse Practitioner

## 2016-07-06 VITALS — BP 134/88 | Temp 97.7°F | Ht 69.0 in | Wt 237.0 lb

## 2016-07-06 DIAGNOSIS — T07XXXA Unspecified multiple injuries, initial encounter: Secondary | ICD-10-CM

## 2016-07-06 DIAGNOSIS — S20211A Contusion of right front wall of thorax, initial encounter: Secondary | ICD-10-CM | POA: Diagnosis not present

## 2016-07-06 MED ORDER — NAPROXEN 500 MG PO TABS: 500.0000 mg | ORAL_TABLET | Freq: Two times a day (BID) | ORAL | 0 refills | Status: DC

## 2016-07-07 ENCOUNTER — Encounter: Payer: Self-pay | Admitting: Nurse Practitioner

## 2016-07-07 NOTE — Progress Notes (Signed)
Subjective:  Presents for recheck of his injuries after falling off a deck on 6/3. States he fell about 6 feet and mainly landed on his right side. Also scraped his left leg when he fell. Tetanus vaccine is up-to-date. Fall knocked the wind out of him but otherwise now having just some soreness. Mild localized pain in the right mid back area today. No fever, cough or SOB.   Objective:   BP 134/88   Temp 97.7 F (36.5 C) (Oral)   Ht 5\' 9"  (1.753 m)   Wt 237 lb (107.5 kg)   BMI 35.00 kg/m  NAD. Alert, oriented. Large superficial abrasion along the left anterior lower leg. Slight bright red bleeding. Several other smaller abrasions on left arm area. Lungs clear. Heart RRR. Abdomen obese soft with a large soft hernia midline with valsalva. States it has been there for years with no change. Abdomen non tender to palpation. No obvious masses but exam limited due to abd girth. Minimal tenderness to left lower ribs. Skin normal. Localized mild tenderness to palpation of right lower thoracic area.    Assessment:  Abrasions of multiple sites  Contusion, chest wall, right, initial encounter    Plan:   Meds ordered this encounter  Medications  . naproxen (NAPROSYN) 500 MG tablet    Sig: Take 1 tablet (500 mg total) by mouth 2 (two) times daily with a meal.    Dispense:  30 tablet    Refill:  0    Order Specific Question:   Supervising Provider    Answer:   Mikey Kirschner [[id number]]   Expect gradual resolution of symptoms. Warning signs reviewed. Defers chest xray at this time. Call back by the end of the week if no improvement, sooner if worse.

## 2016-07-08 ENCOUNTER — Telehealth: Payer: Self-pay | Admitting: Family Medicine

## 2016-07-08 NOTE — Telephone Encounter (Signed)
MESSAGE FOR Logan Alvarez-patient requesting different pain medication stating naproxen 500 mg not helping and is requesting Oxycontin or something that doesn't have advil or tylenol in it.He states current meds not strong enough he couldn't even button his pants or sleep last night.

## 2016-07-09 ENCOUNTER — Other Ambulatory Visit: Payer: Self-pay | Admitting: Nurse Practitioner

## 2016-07-09 MED ORDER — TRAMADOL HCL 50 MG PO TABS: 50.0000 mg | ORAL_TABLET | Freq: Three times a day (TID) | ORAL | 0 refills | Status: DC | PRN

## 2016-07-09 NOTE — Telephone Encounter (Signed)
Will send in Tramadol which does not have any tylenol or ibuprofen. If this does not help let us know. Other pain meds must be written Rx. Office visit Monday if he is not any better.

## 2016-07-09 NOTE — Telephone Encounter (Signed)
Patient notified Logan Alvarez will send in Tramadol which does not have any tylenol or ibuprofen. If this does not help let us know. Other pain meds must be written Rx. Office visit Monday if he is not any better.  Patient verbalized understanding.

## 2016-07-17 ENCOUNTER — Other Ambulatory Visit: Payer: Self-pay | Admitting: Nurse Practitioner

## 2016-07-27 ENCOUNTER — Other Ambulatory Visit: Payer: Self-pay | Admitting: Nurse Practitioner

## 2016-08-20 DIAGNOSIS — R740 Nonspecific elevation of levels of transaminase and lactic acid dehydrogenase [LDH]: Secondary | ICD-10-CM | POA: Diagnosis not present

## 2016-08-20 DIAGNOSIS — E782 Mixed hyperlipidemia: Secondary | ICD-10-CM | POA: Diagnosis not present

## 2016-08-21 LAB — HEPATIC FUNCTION PANEL
ALT: 21 IU/L (ref 0–44)
AST: 22 IU/L (ref 0–40)
Albumin: 4.5 g/dL (ref 3.6–4.8)
Alkaline Phosphatase: 125 IU/L — ABNORMAL HIGH (ref 39–117)
BILIRUBIN TOTAL: 1.5 mg/dL — AB (ref 0.0–1.2)
Bilirubin, Direct: 0.29 mg/dL (ref 0.00–0.40)
Total Protein: 7 g/dL (ref 6.0–8.5)

## 2016-08-28 ENCOUNTER — Encounter: Payer: Self-pay | Admitting: Family Medicine

## 2016-08-28 ENCOUNTER — Ambulatory Visit (INDEPENDENT_AMBULATORY_CARE_PROVIDER_SITE_OTHER): Payer: BLUE CROSS/BLUE SHIELD | Admitting: Family Medicine

## 2016-08-28 VITALS — BP 130/82 | Ht 69.0 in | Wt 233.4 lb

## 2016-08-28 DIAGNOSIS — F419 Anxiety disorder, unspecified: Secondary | ICD-10-CM | POA: Diagnosis not present

## 2016-08-28 DIAGNOSIS — E782 Mixed hyperlipidemia: Secondary | ICD-10-CM

## 2016-08-28 MED ORDER — SERTRALINE HCL 50 MG PO TABS: 50.0000 mg | ORAL_TABLET | Freq: Every day | ORAL | 3 refills | Status: DC

## 2016-08-28 NOTE — Progress Notes (Signed)
   Subjective:    Patient ID: Logan Alvarez, male    DOB: 1955/07/14, 61 y.o.   MRN: [id number]  Hyperlipidemia  This is a chronic problem. The current episode started more than 1 year ago. Treatments tried: pravastatin. There are no compliance problems.  Risk factors for coronary artery disease include dyslipidemia.   Discuss recent repeat labs Recent lab work discussed in detail  Review of Systems Denies any chest tightness pressure pain shortness breath    Objective:   Physical Exam He relates that Cymbalta causing odd dreams Lungs clear heart regular Abdomen soft no guarding rebound or tenderness Extremities no edema   Denies being suicidal    Assessment & Plan:  Anxiety with depression symptoms stop Cymbalta tries Zoloft detailed discussion held regarding this if any problems with it to notify us.  Continue pravastatin liver functions look good  Follow-up office visit in 4 months sooner if any progressive troubles

## 2016-09-07 ENCOUNTER — Telehealth: Payer: Self-pay | Admitting: Family Medicine

## 2016-09-07 NOTE — Telephone Encounter (Signed)
Seen 7/27. cymbalta was changed to zoloft

## 2016-09-07 NOTE — Telephone Encounter (Signed)
Either pathway can be done. The Cymbalta going back to that would be the quickest way for him to feel normal again. I think it would be fine to go to that. If he does not want to go back to Cymbalta then I will recommend an alternative medicine. If he does 1 to go back to the Cymbalta at the dose he was taking I would stop Zoloft and restart the Cymbalta patient should follow-up within 3-4 months

## 2016-09-07 NOTE — Telephone Encounter (Signed)
Pt called stating that the new antidepressant that he is on is causing him to be very anxious. Pt is wanting to go back on the old one or try a different one. Please advise.

## 2016-09-08 ENCOUNTER — Other Ambulatory Visit: Payer: Self-pay | Admitting: *Deleted

## 2016-09-08 MED ORDER — DULOXETINE HCL 30 MG PO CPEP: 30.0000 mg | ORAL_CAPSULE | Freq: Every day | ORAL | 0 refills | Status: DC

## 2016-09-08 NOTE — Telephone Encounter (Signed)
Discussed with pt. Pt verbalized understanding. He would like to restart cymbalta 30mg . Med sent to pharm. D/C zoloft

## 2016-10-03 ENCOUNTER — Other Ambulatory Visit: Payer: Self-pay | Admitting: Family Medicine

## 2016-10-06 NOTE — Telephone Encounter (Signed)
Last seen 08/29/16

## 2016-11-06 ENCOUNTER — Other Ambulatory Visit: Payer: Self-pay | Admitting: Family Medicine

## 2016-12-08 ENCOUNTER — Other Ambulatory Visit: Payer: Self-pay | Admitting: Family Medicine

## 2016-12-08 NOTE — Telephone Encounter (Signed)
May have this +2 refills needs follow-up office visit at that time

## 2016-12-08 NOTE — Telephone Encounter (Signed)
Last seen 08/28/2016

## 2016-12-29 ENCOUNTER — Ambulatory Visit: Payer: BLUE CROSS/BLUE SHIELD | Admitting: Family Medicine

## 2017-01-08 ENCOUNTER — Telehealth: Payer: Self-pay | Admitting: Family Medicine

## 2017-01-08 ENCOUNTER — Other Ambulatory Visit: Payer: Self-pay | Admitting: Family Medicine

## 2017-01-08 NOTE — Telephone Encounter (Signed)
Nurses-Cymbalta was accidentally approved by myself today.  I stopped this medicine back in July and started him on Zoloft.  Please call the pharmacy canceled this medicine.  Please also remove this medication from his list.

## 2017-01-08 NOTE — Addendum Note (Signed)
Addended by: Karle Barr on: 01/08/2017 03:05 PM   Modules accepted: Orders

## 2017-01-08 NOTE — Telephone Encounter (Signed)
I called and spoke to the pharmacist at [facility] they are aware to cancel the rx and they will remove it from the active list there. I have also removed it from our list.

## 2017-01-09 ENCOUNTER — Other Ambulatory Visit: Payer: Self-pay | Admitting: Family Medicine

## 2017-01-12 ENCOUNTER — Telehealth: Payer: Self-pay | Admitting: Family Medicine

## 2017-01-12 ENCOUNTER — Other Ambulatory Visit: Payer: Self-pay | Admitting: Family Medicine

## 2017-01-12 MED ORDER — SERTRALINE HCL 50 MG PO TABS: 50.0000 mg | ORAL_TABLET | Freq: Every day | ORAL | 3 refills | Status: DC

## 2017-01-12 NOTE — Telephone Encounter (Signed)
According to our note back in July Cymbalta was stopped Zoloft was started.  Therefore refill on Zoloft, no further Cymbalta.

## 2017-01-13 NOTE — Telephone Encounter (Signed)
error 

## 2017-01-14 ENCOUNTER — Telehealth: Payer: Self-pay | Admitting: Family Medicine

## 2017-01-14 NOTE — Telephone Encounter (Signed)
Left message to return call 

## 2017-01-14 NOTE — Telephone Encounter (Signed)
Patients last dose of duloxetine was this past Monday.  he said that today is the first day he hasn't felt right, dizzy, hot flashes.  He said he received a call earlier in the week from a nurse letting him know that Dr. Nicki Reaper stopped his duloxetine, but he was unaware of this.  He is requesting a nurse to call him back to discuss the possible side effects of going off this medication and if he is going to be OK stopping it cold [place]?  Also, he wants to know if Dr. Nicki Reaper thinks he should still be taking this?   Larene Pickett

## 2017-01-22 NOTE — Telephone Encounter (Signed)
The patient has a follow up appointment 01/29/17 at 3 pm for a follow up

## 2017-01-22 NOTE — Telephone Encounter (Signed)
Patient states he has completely come off the medication now and the symptoms are gone and he doesn't want to go back on this again.

## 2017-01-27 ENCOUNTER — Telehealth: Payer: Self-pay | Admitting: Family Medicine

## 2017-01-27 DIAGNOSIS — Z79899 Other long term (current) drug therapy: Secondary | ICD-10-CM

## 2017-01-27 DIAGNOSIS — R739 Hyperglycemia, unspecified: Secondary | ICD-10-CM

## 2017-01-27 DIAGNOSIS — E785 Hyperlipidemia, unspecified: Secondary | ICD-10-CM

## 2017-01-27 NOTE — Telephone Encounter (Signed)
Lipid, liver, glucose

## 2017-01-27 NOTE — Telephone Encounter (Signed)
Pt is requesting lab orders to be sent over for an upcoming appt. Last labs per epic were: hepatic on 08/20/16. Pt has an appt with Dr. Nicki Reaper Friday at 3pm.

## 2017-01-28 DIAGNOSIS — R739 Hyperglycemia, unspecified: Secondary | ICD-10-CM | POA: Diagnosis not present

## 2017-01-28 DIAGNOSIS — Z79899 Other long term (current) drug therapy: Secondary | ICD-10-CM | POA: Diagnosis not present

## 2017-01-28 DIAGNOSIS — E785 Hyperlipidemia, unspecified: Secondary | ICD-10-CM | POA: Diagnosis not present

## 2017-01-28 NOTE — Telephone Encounter (Signed)
bloodwork orders put in and pt was notified.

## 2017-01-29 ENCOUNTER — Ambulatory Visit (INDEPENDENT_AMBULATORY_CARE_PROVIDER_SITE_OTHER): Payer: BLUE CROSS/BLUE SHIELD | Admitting: Family Medicine

## 2017-01-29 ENCOUNTER — Encounter: Payer: Self-pay | Admitting: Family Medicine

## 2017-01-29 VITALS — BP 128/82 | Ht 69.0 in | Wt 240.4 lb

## 2017-01-29 DIAGNOSIS — E782 Mixed hyperlipidemia: Secondary | ICD-10-CM

## 2017-01-29 DIAGNOSIS — F419 Anxiety disorder, unspecified: Secondary | ICD-10-CM | POA: Diagnosis not present

## 2017-01-29 LAB — LIPID PANEL
CHOL/HDL RATIO: 3.4 ratio (ref 0.0–5.0)
Cholesterol, Total: 198 mg/dL (ref 100–199)
HDL: 58 mg/dL (ref >39)
LDL Calculated: 101 mg/dL — ABNORMAL HIGH (ref 0–99)
Triglycerides: 193 mg/dL — ABNORMAL HIGH (ref 0–149)
VLDL CHOLESTEROL CAL: 39 mg/dL (ref 5–40)

## 2017-01-29 LAB — HEPATIC FUNCTION PANEL
ALT: 26 IU/L (ref 0–44)
AST: 23 IU/L (ref 0–40)
Albumin: 4.4 g/dL (ref 3.6–4.8)
Alkaline Phosphatase: 113 IU/L (ref 39–117)
Bilirubin Total: 1.2 mg/dL (ref 0.0–1.2)
Bilirubin, Direct: 0.26 mg/dL (ref 0.00–0.40)
TOTAL PROTEIN: 7.4 g/dL (ref 6.0–8.5)

## 2017-01-29 LAB — GLUCOSE, RANDOM: Glucose: 103 mg/dL — ABNORMAL HIGH (ref 65–99)

## 2017-01-29 MED ORDER — ALPRAZOLAM 0.5 MG PO TABS: ORAL_TABLET | ORAL | 5 refills | Status: DC

## 2017-01-29 MED ORDER — PRAVASTATIN SODIUM 20 MG PO TABS: 20.0000 mg | ORAL_TABLET | Freq: Every day | ORAL | 5 refills | Status: DC

## 2017-01-29 NOTE — Progress Notes (Signed)
   Subjective:    Patient ID: Logan Alvarez, male    DOB: Jul 29, 1955, 61 y.o.   MRN: [id number]  Hyperlipidemia  This is a chronic problem. The current episode started more than 1 year ago. Pertinent negatives include no chest pain or shortness of breath. Treatments tried: pravachol. There are no compliance problems.  Risk factors for coronary artery disease include dyslipidemia.   Patient stopped Zoloft he relates he has chronic anxiety issues for which he takes Xanax but he does not use antidepressants he states they don't help and they cause a lot of side effects    Review of Systems  Constitutional: Negative for activity change, fatigue and fever.  Respiratory: Negative for cough and shortness of breath.   Cardiovascular: Negative for chest pain and leg swelling.  Neurological: Negative for headaches.       Objective:   Physical Exam  Constitutional: He appears well-nourished. No distress.  Cardiovascular: Normal rate, regular rhythm and normal heart sounds.  No murmur heard. Pulmonary/Chest: Effort normal and breath sounds normal. No respiratory distress.  Musculoskeletal: He exhibits no edema.  Lymphadenopathy:    He has no cervical adenopathy.  Neurological: He is alert.  Psychiatric: His behavior is normal.  Vitals reviewed.         Assessment & Plan:  Chronic anxiety continue Xanax as directed  Patient was encouraged to cut back on alcohol to one drink or less per day  Patient denies being depressed did not tolerate depression medicines  The patient was seen today as part of an evaluation regarding hyperlipidemia. Recent lab work has been reviewed with the patient as well as the goals for good cholesterol care. In addition to this medications have been discussed the importance of compliance with diet and medications discussed as well. Patient has been informed of potential side effects of medications in the importance to notify us should any problems occur. Finally  the patient is aware that poor control of cholesterol, noncompliance can dramatically increase her risk of heart attack strokes and premature death. The patient will keep regular office visits and the patient does agreed to periodic lab work.  Prediabetes and patient was encouraged to watch diet watch portions try to lose weight  Follow-up wellness checkup end of April

## 2017-02-04 ENCOUNTER — Telehealth: Payer: Self-pay | Admitting: Family Medicine

## 2017-02-04 DIAGNOSIS — Z1211 Encounter for screening for malignant neoplasm of colon: Secondary | ICD-10-CM

## 2017-02-04 NOTE — Telephone Encounter (Signed)
Please give referral as requested

## 2017-02-04 NOTE — Telephone Encounter (Signed)
Patient is aware 

## 2017-02-04 NOTE — Telephone Encounter (Signed)
Requesting referral to [facility] for his colonoscopy.  He was referred in the past, but did not follow through and they are requiring a new referral.

## 2017-02-08 ENCOUNTER — Telehealth: Payer: Self-pay | Admitting: Family Medicine

## 2017-02-08 DIAGNOSIS — M79673 Pain in unspecified foot: Secondary | ICD-10-CM

## 2017-02-08 NOTE — Telephone Encounter (Signed)
plz set up ref to podiatry for foot pain

## 2017-02-08 NOTE — Telephone Encounter (Signed)
Patient said that at his last visit with Dr. Nicki Reaper on 01/29/17, he was going to refer to podiatry due to issues he is having from his foot he broke 2 years ago.  There is nothing in the system.  Please advise.

## 2017-02-08 NOTE — Telephone Encounter (Signed)
Referral ordered in Epic. 

## 2017-02-15 ENCOUNTER — Encounter: Payer: Self-pay | Admitting: Family Medicine

## 2017-02-22 ENCOUNTER — Telehealth: Payer: Self-pay | Admitting: Orthopedic Surgery

## 2017-02-22 ENCOUNTER — Telehealth: Payer: Self-pay | Admitting: Family Medicine

## 2017-02-22 NOTE — Telephone Encounter (Signed)
Patient called and said that you had referred him to a foot doctor, but it is not the doctor that he and Dr. Nicki Reaper had discussed him going to see.  He states that he was supposed to be referred to Dr. Aline Brochure in [place] and he wants to get this changed.

## 2017-02-22 NOTE — Telephone Encounter (Signed)
Logan Alvarez called asking about a referral to our office from Dr. Wolfgang Phoenix.  I told him that I didn't see a referral for him.  He then said that previously he had seen Dr. Sharol Given  for his ankle and  now Dr. Wolfgang Phoenix wanted to send him to Dr. Posey Pronto.  He wanted to know why Dr. Wolfgang Phoenix was sending him to Dr. Posey Pronto instead of to our office.  I told him that I could not answer that question.  I did tell him that even if he were referred here we would need his previous ortho notes and x-rays for Dr. Aline Brochure to review.  He understood.  He said if he had further questions, he would call Dr. Lance Sell office.

## 2017-02-23 NOTE — Telephone Encounter (Signed)
Please advise  Pt states that we did not refer him to the doctor that Dr. Nicki Reaper called by name during last OV  There is no mention in last OV of a specific podiatrist by name or even mention of the need to see a podiatrist.  Pt was referred to Dr. Posey Pronto (Dr. Arvil Persons partner in [place])  See phone message from Dr. Ruthe Mannan office where pt called there, they told pt they would need to review his previous records from Dr. Sharol Given to review before deciding to take over care

## 2017-02-24 ENCOUNTER — Ambulatory Visit: Payer: BLUE CROSS/BLUE SHIELD

## 2017-02-24 DIAGNOSIS — Z1211 Encounter for screening for malignant neoplasm of colon: Secondary | ICD-10-CM

## 2017-02-24 MED ORDER — PEG 3350-KCL-NA BICARB-NACL 420 G PO SOLR: 4000.0000 mL | ORAL | 0 refills | Status: DC

## 2017-02-24 NOTE — Patient Instructions (Signed)
Logan Alvarez   10/18/1955 MRN: [id number]    Procedure Date: 03/12/17 Time to register:1:00 Place to register: [facility] Procedure Time: 2:00 Scheduled provider: Sandi Fields, MD  PREPARATION FOR COLONOSCOPY WITH TRI-LYTE SPLIT PREP  Please notify us immediately if you are diabetic, take iron supplements, or if you are on Coumadin or any other blood thinners.    You will need to purchase 1 fleet enema and 1 box of Bisacodyl 5mg tablets.   1 DAY BEFORE PROCEDURE:  DATE: 03/11/17 DAY: Thursday Continue clear liquids the entire day - NO SOLID FOOD.    At 2:00 pm:  Take 2 Bisacodyl tablets.   At 4:00pm:  Start drinking your solution. Make sure you mix well per instructions on the bottle. Try to drink 1 (one) 8 ounce glass every 10-15 minutes until you have consumed HALF the jug. You should complete by 6:00pm.You must keep the left over solution refrigerated until completed next day.  Continue clear liquids. You must drink plenty of clear liquids to prevent dehyration and kidney failure. Nothing to eat or drink after midnight.  EXCEPTION: If you take medications for your heart, blood pressure or breathing, you may take these medications with a small amount of clear liquid.    DAY OF PROCEDURE:   DATE:03/12/17   DAY: Friday    Five hours before your procedure time @ 9:00am:  Finish remaining amout of bowel prep, drinking 1 (one) 8 ounce glass every 10-15 minutes until complete. You have two hours to consume remaining prep.   Three hours before your procedure time @11:00am:  Nothing by mouth.   At least one hour before going to the hospital:  Give yourself one Fleet enema. You may take your morning medications with sip of water unless we have instructed otherwise.      Please see below for Dietary Information.  CLEAR LIQUIDS INCLUDE:  Water Jello (NOT red in color)   Ice Popsicles (NOT red in color)   Tea (sugar ok, no milk/cream) Powdered fruit flavored drinks   Coffee (sugar ok, no milk/cream) Gatorade/ Lemonade/ Kool-Aid  (NOT red in color)   Juice: apple, white grape, white cranberry Soft drinks  Clear bullion, consomme, broth (fat free beef/chicken/vegetable)  Carbonated beverages (any kind)  Strained chicken noodle soup Hard Candy   Remember: Clear liquids are liquids that will allow you to see your fingers on the other side of a clear glass. Be sure liquids are NOT red in color, and not cloudy, but CLEAR.  DO NOT EAT OR DRINK ANY OF THE FOLLOWING:  Dairy products of any kind   Cranberry juice Tomato juice / V8 juice   Grapefruit juice Orange juice     Red grape juice  Do not eat any solid foods, including such foods as: cereal, oatmeal, yogurt, fruits, vegetables, creamed soups, eggs, bread, crackers, pureed foods in a blender, etc.   HELPFUL HINTS FOR DRINKING PREP SOLUTION:   Make sure prep is extremely cold. Mix and refrigerate the the morning of the prep. You may also put in the freezer.   You may try mixing some Crystal Light or Country Time Lemonade if you prefer. Mix in small amounts; add more if necessary.  Try drinking through a straw  Rinse mouth with water or a mouthwash between glasses, to remove after-taste.  Try sipping on a cold beverage /ice/ popsicles between glasses of prep.  Place a piece of sugar-free hard candy in mouth between glasses.  If you   become nauseated, try consuming smaller amounts, or stretch out the time between glasses. Stop for 30-60 minutes, then slowly start back drinking.        OTHER INSTRUCTIONS  You will need a responsible adult at least 63 years of age to accompany you and drive you home. This person must remain in the waiting room during your procedure. The hospital will cancel your procedure if you do not have a responsible adult with you.   1. Wear loose fitting clothing that is easily removed. 2. Leave jewelry and other valuables at home.  3. Remove all body piercing jewelry and  leave at home. 4. Total time from sign-in until discharge is approximately 2-3 hours. 5. You should go home directly after your procedure and rest. You can resume normal activities the day after your procedure. 6. The day of your procedure you should not:  Drive  Make legal decisions  Operate machinery  Drink alcohol  Return to work   You may call the office (Dept: [phone]) before 5:00pm, or page the doctor on call [phone]) after 5:00pm, for further instructions, if necessary.   Insurance Information YOU WILL NEED TO CHECK WITH YOUR INSURANCE COMPANY FOR THE BENEFITS OF COVERAGE YOU HAVE FOR THIS PROCEDURE.  UNFORTUNATELY, NOT ALL INSURANCE COMPANIES HAVE BENEFITS TO COVER ALL OR PART OF THESE TYPES OF PROCEDURES.  IT IS YOUR RESPONSIBILITY TO CHECK YOUR BENEFITS, HOWEVER, WE WILL BE GLAD TO ASSIST YOU WITH ANY CODES YOUR INSURANCE COMPANY MAY NEED.    PLEASE NOTE THAT MOST INSURANCE COMPANIES WILL NOT COVER A SCREENING COLONOSCOPY FOR PEOPLE UNDER THE AGE OF 50  IF YOU HAVE BCBS INSURANCE, YOU MAY HAVE BENEFITS FOR A SCREENING COLONOSCOPY BUT IF POLYPS ARE FOUND THE DIAGNOSIS WILL CHANGE AND THEN YOU MAY HAVE A DEDUCTIBLE THAT WILL NEED TO BE MET. SO PLEASE MAKE SURE YOU CHECK YOUR BENEFITS FOR A SCREENING COLONOSCOPY AS WELL AS A DIAGNOSTIC COLONOSCOPY.

## 2017-02-24 NOTE — Telephone Encounter (Signed)
Please refer him to Dr. Aline Brochure certainly it is possible that we cover this issue

## 2017-02-24 NOTE — Progress Notes (Addendum)
Gastroenterology Pre-Procedure Review  Request Date:02/24/17 Requesting Physician: Dr.Luking  PATIENT REVIEW QUESTIONS: The patient responded to the following health history questions as indicated:    Pt stated he had a tcs done at [facility] about 15-18 years ago that was done as an inpatient. He does not remember who did the procedure and there is no record of it in Epic. He is not sure if he had polyps or not. Pt is not having any problems at this time.     1. Diabetes Melitis: no 2. Joint replacements in the past 12 months: no 3. Major health problems in the past 3 months: no 4. Has an artificial valve or MVP: no 5. Has a defibrillator: no 6. Has been advised in past to take antibiotics in advance of a procedure like teeth cleaning: no 7. Family history of colon cancer: no  8. Alcohol Use: yes (daily- 2 beers) 9. History of sleep apnea: yes (does not have a cpap)  10. History of coronary artery or other vascular stents placed within the last 12 months: no 11. History of any prior anesthesia complications: no    MEDICATIONS & ALLERGIES:    Patient reports the following regarding taking any blood thinners:   Plavix? no Aspirin? no Coumadin? no Brilinta? no Xarelto? no Eliquis? no Pradaxa? no Savaysa? no Effient? no  Patient confirms/reports the following medications:  Current Outpatient Medications  Medication Sig Dispense Refill  . ALPRAZolam (XANAX) 0.5 MG tablet ONE TID PRN 90 tablet 5  . fluticasone (FLONASE) 50 MCG/ACT nasal spray Place 2 sprays into both nostrils daily. 16 g 6  . pravastatin (PRAVACHOL) 20 MG tablet Take 1 tablet (20 mg total) by mouth daily. 30 tablet 5   No current facility-administered medications for this visit.     Patient confirms/reports the following allergies:  Allergies  Allergen Reactions  . Cymbalta [Duloxetine Hcl]     Bad dreams  . Lodine [Etodolac] Nausea And Vomiting  . Zoloft [Sertraline Hcl]     REDUCED URINARY FLOW,NOCTURIA     No orders of the defined types were placed in this encounter.   AUTHORIZATION INFORMATION Primary Insurance: [place],  [place] #: [phone] No PA needed, ref number: [id number]    SCHEDULE INFORMATION: Procedure has been scheduled as follows:  Date: 03/12/17, Time: 2:00 Location: [facility] Dr.Fields  This Gastroenterology Pre-Precedure Review Form is being routed to the following provider(s): Walden Field NP

## 2017-02-25 NOTE — Telephone Encounter (Signed)
done

## 2017-03-03 ENCOUNTER — Encounter (INDEPENDENT_AMBULATORY_CARE_PROVIDER_SITE_OTHER): Payer: Self-pay | Admitting: Orthopedic Surgery

## 2017-03-03 ENCOUNTER — Ambulatory Visit (INDEPENDENT_AMBULATORY_CARE_PROVIDER_SITE_OTHER): Payer: Self-pay

## 2017-03-03 ENCOUNTER — Ambulatory Visit (INDEPENDENT_AMBULATORY_CARE_PROVIDER_SITE_OTHER): Payer: BLUE CROSS/BLUE SHIELD | Admitting: Orthopedic Surgery

## 2017-03-03 DIAGNOSIS — M79671 Pain in right foot: Secondary | ICD-10-CM

## 2017-03-03 DIAGNOSIS — M19071 Primary osteoarthritis, right ankle and foot: Secondary | ICD-10-CM | POA: Diagnosis not present

## 2017-03-03 DIAGNOSIS — S92061S Displaced intraarticular fracture of right calcaneus, sequela: Secondary | ICD-10-CM | POA: Insufficient documentation

## 2017-03-03 NOTE — Progress Notes (Signed)
Office Visit Note   Patient: Logan Alvarez           Date of Birth: [birth year]           MRN: [id number] Visit Date: 03/03/2017              Requested by: Kathyrn Drown, MD [address], [place] [address] PCP: Kathyrn Drown, MD  Chief Complaint  Patient presents with  . Right Foot - Pain      HPI: Patient is a 62 year old gentleman who presents complaining of his ankle rolling to the side on the right he is 2 years status post a calcaneal fracture he states that he has difficulty on uneven terrain and this makes his ankle will further.  Patient also complains of some radicular pain into the right buttocks.  Assessment & Plan: Visit Diagnoses:  1. Right foot pain   2. Closed intra-articular fracture of right calcaneus, sequela   3. Arthritis of midtarsal joint of right foot     Plan: Discussed the importance of a stiff soled shoe he will get a pair of Trail running sneakers with over-the-counter orthotics to support the midfoot forefoot and stabilize the subtalar joint.  Discussed that if he fails conservative therapy we could consider a fusion of the subtalar joint but most of his pain does not appear to be coming from the subtalar joint but more from the midfoot arthritis and the hallux rigidus arthritis.  Follow-Up Instructions: Return if symptoms worsen or fail to improve.   Ortho Exam  Patient is alert, oriented, no adenopathy, well-dressed, normal affect, normal respiratory effort. Examination patient has a good dorsalis pedis pulse he has no pain with range of motion of the ankle.  Semination the great toe is tenderness to palpation of the MTP joint he does have hallux rigidus with dorsiflexion only to 30 degrees.  He has no subtalar motion and has minimal pain to palpation in the sinus Tarsi.  He is tender to palpation across the midfoot where he has destructive arthritic changes through the midfoot.  Patient's instability of the ankle appears to be  coming from the subtalar fibrous fusion which is eliminated his subtalar motion.  He has no subtalar motion.  Imaging: Xr Foot Complete Right  Result Date: 03/03/2017 3 view radiographs of the right foot shows hallux rigidus of the right great toe MTP joint with bony spurs.  Patient has arthrosis through the midfoot with decreased bone mineral density.  Lateral radiograph of the calcaneus shows collapse of the posterior facet of the subtalar joint.  The ankle is congruent on the foot radiographs.  No images are attached to the encounter.  Labs: Lab Results  Component Value Date   HGBA1C 5.1 05/25/2012    @LABSALLVALUES (HGBA1)@  There is no height or weight on file to calculate BMI.  Orders:  Orders Placed This Encounter  Procedures  . XR Foot Complete Right   No orders of the defined types were placed in this encounter.    Procedures: No procedures performed  Clinical Data: No additional findings.  ROS:  All other systems negative, except as noted in the HPI. Review of Systems  Objective: Vital Signs: There were no vitals taken for this visit.  Specialty Comments:  No specialty comments available.  PMFS History: Patient Active Problem List   Diagnosis Date Noted  . Closed intra-articular fracture of right calcaneus, sequela 03/03/2017  . Arthritis of midtarsal joint of right foot 03/03/2017  .  Testosterone deficiency 05/27/2012  . Chest pain 04/15/2011  . HTN (hypertension) 04/15/2011  . Mixed hyperlipidemia 04/15/2011  . Anxiety 04/15/2011   Past Medical History:  Diagnosis Date  . Chest pain    a. 2008: Normal ETT;  b. 01/2013 CTA: Ca score of 715 (92%'ile), LAD 50p, D1 50-75, RCA 50p.  Marland Kitchen Hyperlipidemia   . Hypertension   . Testosterone deficiency 2009    Family History  Problem Relation Age of Onset  . Heart attack Father        4's    Past Surgical History:  Procedure Laterality Date  . LEFT HEART CATHETERIZATION WITH CORONARY ANGIOGRAM N/A  02/01/2013   Procedure: LEFT HEART CATHETERIZATION WITH CORONARY ANGIOGRAM;  Surgeon: Josue Hector, MD;  Location: [facility] C[facility] LAB;  Service: Cardiovascular;  Laterality: N/A;   Social History   Occupational History  . Not on file  Tobacco Use  . Smoking status: Never Smoker  . Smokeless tobacco: Never Used  Substance and Sexual Activity  . Alcohol use: Yes    Comment: states a 12 pack since this afternoon  . Drug use: No  . Sexual activity: Not on file

## 2017-03-03 NOTE — Progress Notes (Signed)
xr

## 2017-03-12 ENCOUNTER — Other Ambulatory Visit: Payer: Self-pay

## 2017-03-12 ENCOUNTER — Ambulatory Visit (HOSPITAL_COMMUNITY)
Admission: RE | Admit: 2017-03-12 | Discharge: 2017-03-12 | Disposition: A | Discharge status: Home / Self Care | Payer: BLUE CROSS/BLUE SHIELD | Source: Ambulatory Visit | Attending: Gastroenterology | Admitting: Gastroenterology

## 2017-03-12 ENCOUNTER — Encounter (HOSPITAL_COMMUNITY): Payer: Self-pay | Admitting: *Deleted

## 2017-03-12 ENCOUNTER — Encounter (HOSPITAL_COMMUNITY)
Admission: RE | Disposition: A | Discharge status: Home / Self Care | Payer: Self-pay | Source: Ambulatory Visit | Attending: Gastroenterology

## 2017-03-12 DIAGNOSIS — Z1211 Encounter for screening for malignant neoplasm of colon: Secondary | ICD-10-CM | POA: Diagnosis not present

## 2017-03-12 DIAGNOSIS — Q438 Other specified congenital malformations of intestine: Secondary | ICD-10-CM | POA: Insufficient documentation

## 2017-03-12 DIAGNOSIS — D124 Benign neoplasm of descending colon: Secondary | ICD-10-CM | POA: Diagnosis not present

## 2017-03-12 DIAGNOSIS — Z886 Allergy status to analgesic agent status: Secondary | ICD-10-CM | POA: Diagnosis not present

## 2017-03-12 DIAGNOSIS — Z9889 Other specified postprocedural states: Secondary | ICD-10-CM | POA: Diagnosis not present

## 2017-03-12 DIAGNOSIS — D122 Benign neoplasm of ascending colon: Secondary | ICD-10-CM

## 2017-03-12 DIAGNOSIS — Z8249 Family history of ischemic heart disease and other diseases of the circulatory system: Secondary | ICD-10-CM | POA: Insufficient documentation

## 2017-03-12 DIAGNOSIS — D123 Benign neoplasm of transverse colon: Secondary | ICD-10-CM | POA: Diagnosis not present

## 2017-03-12 DIAGNOSIS — E785 Hyperlipidemia, unspecified: Secondary | ICD-10-CM | POA: Insufficient documentation

## 2017-03-12 DIAGNOSIS — D125 Benign neoplasm of sigmoid colon: Secondary | ICD-10-CM

## 2017-03-12 DIAGNOSIS — K648 Other hemorrhoids: Secondary | ICD-10-CM | POA: Insufficient documentation

## 2017-03-12 DIAGNOSIS — Z79899 Other long term (current) drug therapy: Secondary | ICD-10-CM | POA: Insufficient documentation

## 2017-03-12 DIAGNOSIS — I10 Essential (primary) hypertension: Secondary | ICD-10-CM | POA: Diagnosis not present

## 2017-03-12 DIAGNOSIS — Z7951 Long term (current) use of inhaled steroids: Secondary | ICD-10-CM | POA: Diagnosis not present

## 2017-03-12 DIAGNOSIS — Z888 Allergy status to other drugs, medicaments and biological substances status: Secondary | ICD-10-CM | POA: Diagnosis not present

## 2017-03-12 DIAGNOSIS — G8929 Other chronic pain: Secondary | ICD-10-CM

## 2017-03-12 HISTORY — PX: COLONOSCOPY: SHX5424

## 2017-03-12 SURGERY — COLONOSCOPY
Anesthesia: Moderate Sedation

## 2017-03-12 MED ORDER — MEPERIDINE HCL 100 MG/ML IJ SOLN
INTRAMUSCULAR | Status: AC
  Filled 2017-03-12: qty 2

## 2017-03-12 MED ORDER — MEPERIDINE HCL 100 MG/ML IJ SOLN
INTRAMUSCULAR | Status: DC | PRN
  Administered 2017-03-12: 50 mg
  Administered 2017-03-12 (×2): 25 mg

## 2017-03-12 MED ORDER — PROMETHAZINE HCL 25 MG/ML IJ SOLN
INTRAMUSCULAR | Status: AC
  Filled 2017-03-12: qty 1

## 2017-03-12 MED ORDER — PROMETHAZINE HCL 25 MG/ML IJ SOLN
INTRAMUSCULAR | Status: DC | PRN
  Administered 2017-03-12: 12.5 mg via INTRAVENOUS

## 2017-03-12 MED ORDER — SODIUM CHLORIDE 0.9 % IV SOLN
INTRAVENOUS | Status: DC
  Administered 2017-03-12: 13:00:00 via INTRAVENOUS

## 2017-03-12 MED ORDER — MIDAZOLAM HCL 5 MG/5ML IJ SOLN
INTRAMUSCULAR | Status: AC
  Filled 2017-03-12: qty 10

## 2017-03-12 MED ORDER — MIDAZOLAM HCL 5 MG/5ML IJ SOLN
INTRAMUSCULAR | Status: DC | PRN
  Administered 2017-03-12: 1 mg via INTRAVENOUS
  Administered 2017-03-12 (×2): 2 mg via INTRAVENOUS
  Administered 2017-03-12: 1 mg via INTRAVENOUS

## 2017-03-12 NOTE — Op Note (Signed)
[facility] Patient Name: Logan Alvarez Procedure Date: 03/12/2017 1:15 PM MRN: [id number] Date of Birth: [birth year] Attending Alvarez: Alvarez Drain MD, Alvarez CSN: [id number] Age: 62 Admit Type: Outpatient Procedure:                Colonoscopy with COLD SNARE/SNARE CAUTERY                            POLYPECTOMY Indications:              Screening for colorectal malignant neoplasm Providers:                Alvarez Drain MD, Alvarez, Janeece Riggers, RN, Nelma Rothman,                            Technician Referring Alvarez:             Elayne Snare. Luking Medicines:                Promethazine 12.5 mg IV, Meperidine 100 mg IV,                            Midazolam 6 mg IV Complications:            No immediate complications. Estimated Blood Loss:     Estimated blood loss: none. Procedure:                Pre-Anesthesia Assessment:                           - Prior to the procedure, a History and Physical                            was performed, and patient medications and                            allergies were reviewed. The patient's tolerance of                            previous anesthesia was also reviewed. The risks                            and benefits of the procedure and the sedation                            options and risks were discussed with the patient.                            All questions were answered, and informed consent                            was obtained. Prior Anticoagulants: The patient has                            taken ibuprofen, last dose was 7 days prior to  procedure. ASA Grade Assessment: II - A patient                            with mild systemic disease. After reviewing the                            risks and benefits, the patient was deemed in                            satisfactory condition to undergo the procedure.                            After obtaining informed consent, the colonoscope                            was passed under  direct vision. Throughout the                            procedure, the patient's blood pressure, pulse, and                            oxygen saturations were monitored continuously. The                            EC-[id number] ([id number]) scope was introduced through                            the anus and advanced to the the cecum, identified                            by appendiceal orifice and ileocecal valve. The                            colonoscopy was technically difficult and complex                            due to restricted mobility of the colon,                            significant looping and the patient's discomfort                            during the procedure. Successful completion of the                            procedure was aided by increasing the dose of                            sedation medication, straightening and shortening                            the scope to obtain bowel loop reduction and  COLOWRAP. The patient tolerated the procedure                            fairly well. The quality of the bowel preparation                            was good. The ileocecal valve, appendiceal orifice,                            and rectum were photographed. Scope In: 2:09:35 PM Scope Out: 2:51:19 PM Scope Withdrawal Time: 0 hours 37 minutes 39 seconds  Total Procedure Duration: 0 hours 41 minutes 44 seconds  Findings:      SIXTEEN sessile polyps were found in the sigmoid colon, descending       colon, distal descending colon, hepatic flexure and ascending colon. The       polyps were 3 to 8 mm in size. These polyps were removed with a cold       snare. Resection and retrieval were complete.      A 12 mm polyp was found in the sigmoid colon. The polyp was sessile. The       polyp was removed with a hot snare. Resection and retrieval were       complete.      The recto-sigmoid colon, sigmoid colon and descending colon were        significantly redundant.      Internal hemorrhoids were found during retroflexion. The hemorrhoids       were small. Impression:               - SIXTEEN 3 to 8 mm polyps in the sigmoid colon, in                            the descending colon, in the distal descending                            colon, at the hepatic flexure and in the ascending                            colon, removed with a cold snare. Resected and                            retrieved.                           - One 12 mm polyp in the sigmoid colon, removed                            with a hot snare. Resected and retrieved.                           - SEVERELY Redundant LEFT colon.                           - SMALL Internal hemorrhoids. Moderate Sedation:      Moderate (conscious) sedation was administered by the endoscopy nurse  and supervised by the endoscopist. The following parameters were       monitored: oxygen saturation, heart rate, blood pressure, and response       to care. Total physician intraservice time was 57 minutes. Recommendation:           - Await pathology results.                           - Repeat colonoscopy in 3 years for surveillance                            WITH MAC. NO CHICKEN BROTH WITHOUT IN IT.                           - High fiber diet.                           - Continue present medications.                           - Patient has a contact number available for                            emergencies. The signs and symptoms of potential                            delayed complications were discussed with the                            patient. Return to normal activities tomorrow.                            Written discharge instructions were provided to the                            patient. Procedure Code(s):        --- Professional ---                           [phone], Colonoscopy, flexible; with removal of                            tumor(s), polyp(s), or other lesion(s)  by snare                            technique                           99152, Moderate sedation services provided by the                            same physician or other qualified health care                            professional performing the diagnostic or  therapeutic service that the sedation supports,                            requiring the presence of an independent trained                            observer to assist in the monitoring of the                            patient's level of consciousness and physiological                            status; initial 15 minutes of intraservice time,                            patient age 59 years or older                           [phone], Moderate sedation services; each additional                            15 minutes intraservice time                           [phone], Moderate sedation services; each additional                            15 minutes intraservice time                           99153, Moderate sedation services; each additional                            15 minutes intraservice time Diagnosis Code(s):        --- Professional ---                           Z12.11, Encounter for screening for malignant                            neoplasm of colon                           D12.5, Benign neoplasm of sigmoid colon                           D12.4, Benign neoplasm of descending colon                           D12.3, Benign neoplasm of transverse colon (hepatic                            flexure or splenic flexure)                           D12.2, Benign neoplasm of ascending colon  K64.8, Other hemorrhoids                           Q43.8, Other specified congenital malformations of                            intestine CPT copyright 2016 American Medical Association. All rights reserved. The codes documented in this report are preliminary and upon coder review may  be revised  to meet current compliance requirements. Alvarez Drain, Alvarez Alvarez Drain MD, Alvarez 03/12/2017 3:06:33 PM This report has been signed electronically. Number of Addenda: 0

## 2017-03-12 NOTE — H&P (Signed)
Primary Care Physician:  Kathyrn Drown, MD Primary Gastroenterologist:  Dr. Oneida Alar  Pre-Procedure History & Physical: HPI:  Logan Alvarez is a 62 y.o. male here for COLON CANCER SCREENING.  Past Medical History:  Diagnosis Date  . Chest pain    a. 2008: Normal ETT;  b. 01/2013 CTA: Ca score of 715 (92%'ile), LAD 50p, D1 50-75, RCA 50p.  Marland Kitchen Hyperlipidemia   . Hypertension   . Testosterone deficiency 2009    Past Surgical History:  Procedure Laterality Date  . LEFT HEART CATHETERIZATION WITH CORONARY ANGIOGRAM N/A 02/01/2013   Procedure: LEFT HEART CATHETERIZATION WITH CORONARY ANGIOGRAM;  Surgeon: Josue Hector, MD;  Location: [facility] CATH LAB;  Service: Cardiovascular;  Laterality: N/A;  . NECK SURGERY      Prior to Admission medications   Medication Sig Start Date End Date Taking? Authorizing Provider  ALPRAZolam (XANAX) 0.5 MG tablet ONE TID PRN Patient taking differently: Take 0.5 mg by mouth 3 (three) times daily as needed (for anxiety.).  01/29/17  Yes Luking, Elayne Snare, MD  fluticasone (FLONASE) 50 MCG/ACT nasal spray Place 2 sprays into both nostrils daily. Patient taking differently: Place 2 sprays into both nostrils daily as needed (for allergies.).  05/29/16  Yes Luking, Elayne Snare, MD  ibuprofen (ADVIL,MOTRIN) 200 MG tablet Take 400 mg by mouth every 8 (eight) hours as needed (for pain.).   Yes [provider]  polyethylene glycol-electrolytes (TRILYTE) 420 g solution Take 4,000 mLs by mouth as directed. 02/24/17  Yes Carlis Stable, NP  pravastatin (PRAVACHOL) 20 MG tablet Take 1 tablet (20 mg total) by mouth daily. 01/29/17  Yes Kathyrn Drown, MD    Allergies as of 02/25/2017 - Review Complete 02/24/2017  Allergen Reaction Noted  . Cymbalta [duloxetine hcl]  08/28/2016  . Lodine [etodolac] Nausea And Vomiting 05/20/2012  . Zoloft [sertraline hcl]  01/29/2017    Family History  Problem Relation Age of Onset  . Heart attack Father        52's    Social  History   Socioeconomic History  . Marital status: Widowed    Spouse name: Not on file  . Number of children: Not on file  . Years of education: Not on file  . Highest education level: Not on file  Social Needs  . Financial resource strain: Not on file  . Food insecurity - worry: Not on file  . Food insecurity - inability: Not on file  . Transportation needs - medical: Not on file  . Transportation needs - non-medical: Not on file  Occupational History  . Not on file  Tobacco Use  . Smoking status: Never Smoker  . Smokeless tobacco: Never Used  Substance and Sexual Activity  . Alcohol use: Yes    Comment: states a 12 pack since this afternoon  . Drug use: No  . Sexual activity: Not on file  Other Topics Concern  . Not on file  Social History Narrative  . Not on file    Review of Systems: See HPI, otherwise negative ROS   Physical Exam: BP (!) 146/92   Pulse 80   Temp 98.9 F (37.2 C) (Oral)   Resp 11   SpO2 98%  General:   Alert,  pleasant and cooperative in NAD Head:  Normocephalic and atraumatic. Neck:  Supple; Lungs:  Clear throughout to auscultation.    Heart:  Regular rate and rhythm. Abdomen:  Soft, nontender and nondistended. Normal bowel sounds, without guarding, and without  rebound.   Neurologic:  Alert and  oriented x4;  grossly normal neurologically.  Impression/Plan:     SCREENING  Plan:  1. TCS TODAY DISCUSSED PROCEDURE, BENEFITS, & RISKS: < 1% chance of medication reaction, bleeding, perforation, or rupture of spleen/liver.

## 2017-03-12 NOTE — Discharge Instructions (Signed)
You had 16 polyps removed. You have SMALL internal hemorrhoids.   DRINK WATER TO KEEP YOUR URINE LIGHT YELLOW.  FOLLOW A HIGH FIBER DIET. AVOID ITEMS THAT CAUSE BLOATING & GAS. SEE INFO BELOW.  CONTINUE YOUR WEIGHT LOSS EFFORTS.  WHILE I DO NOT WANT TO ALARM YOU, YOUR BODY MASS INDEX IS OVER 30 WHICH MEANS YOU ARE OBESE. OBESITY IS ASSOCIATED WITH AN INCREASED RISK FOR CIRRHOSIS AND ALL CANCERS, INCLUDING ESOPHAGEAL AND COLON CANCER. A WEIGHT OF 200 LBS OR LESS  WILL KEEP YOUR BODY MASS INDEX(BMI) UNDER 30.  YOUR BIOPSY RESULTS WILL BE AVAILABLE IN MY CHART AFTER FEB 12 AND MY OFFICE WILL CONTACT YOU IN 10-14 DAYS WITH YOUR RESULTS.   Next colonoscopy in 3 years.    Colonoscopy Care After Read the instructions outlined below and refer to this sheet in the next week. These discharge instructions provide you with general information on caring for yourself after you leave the hospital. While your treatment has been planned according to the most current medical practices available, unavoidable complications occasionally occur. If you have any problems or questions after discharge, call DR. Edona Schreffler, [phone].  ACTIVITY  You may resume your regular activity, but move at a slower pace for the next 24 hours.   Take frequent rest periods for the next 24 hours.   Walking will help get rid of the air and reduce the bloated feeling in your belly (abdomen).   No driving for 24 hours (because of the medicine (anesthesia) used during the test).   You may shower.   Do not sign any important legal documents or operate any machinery for 24 hours (because of the anesthesia used during the test).    NUTRITION  Drink plenty of fluids.   You may resume your normal diet as instructed by your doctor.   Begin with a light meal and progress to your normal diet. Heavy or fried foods are harder to digest and may make you feel sick to your stomach (nauseated).   Avoid alcoholic beverages for 24 hours  or as instructed.    MEDICATIONS  You may resume your normal medications.   WHAT YOU CAN EXPECT TODAY  Some feelings of bloating in the abdomen.   Passage of more gas than usual.   Spotting of blood in your stool or on the toilet paper  .  IF YOU HAD POLYPS REMOVED DURING THE COLONOSCOPY:  Eat a soft diet IF YOU HAVE NAUSEA, BLOATING, ABDOMINAL PAIN, OR VOMITING.    FINDING OUT THE RESULTS OF YOUR TEST Not all test results are available during your visit. DR. Oneida Alar WILL CALL YOU WITHIN 14 DAYS OF YOUR PROCEDUE WITH YOUR RESULTS. Do not assume everything is normal if you have not heard from DR. Alyas Creary, CALL HER OFFICE AT [phone].  SEEK IMMEDIATE MEDICAL ATTENTION AND CALL THE OFFICE: [phone] IF:  You have more than a spotting of blood in your stool.   Your belly is swollen (abdominal distention).   You are nauseated or vomiting.   You have a temperature over 101F.   You have abdominal pain or discomfort that is severe or gets worse throughout the day.   High-Fiber Diet A high-fiber diet changes your normal diet to include more whole grains, legumes, fruits, and vegetables. Changes in the diet involve replacing refined carbohydrates with unrefined foods. The calorie level of the diet is essentially unchanged. The Dietary Reference Intake (recommended amount) for adult males is 38 grams per day. For adult females,  it is 25 grams per day. Pregnant and lactating women should consume 28 grams of fiber per day. Fiber is the intact part of a plant that is not broken down during digestion. Functional fiber is fiber that has been isolated from the plant to provide a beneficial effect in the body. PURPOSE  Increase stool bulk.   Ease and regulate bowel movements.   Lower cholesterol.   REDUCE RISK OF COLON CANCER  INDICATIONS THAT YOU NEED MORE FIBER  Constipation and hemorrhoids.   Uncomplicated diverticulosis (intestine condition) and irritable bowel syndrome.    Weight management.   As a protective measure against hardening of the arteries (atherosclerosis), diabetes, and cancer.   GUIDELINES FOR INCREASING FIBER IN THE DIET  Start adding fiber to the diet slowly. A gradual increase of about 5 more grams (2 slices of whole-wheat bread, 2 servings of most fruits or vegetables, or 1 bowl of high-fiber cereal) per day is best. Too rapid an increase in fiber may result in constipation, flatulence, and bloating.   Drink enough water and fluids to keep your urine clear or pale yellow. Water, juice, or caffeine-free drinks are recommended. Not drinking enough fluid may cause constipation.   Eat a variety of high-fiber foods rather than one type of fiber.   Try to increase your intake of fiber through using high-fiber foods rather than fiber pills or supplements that contain small amounts of fiber.   The goal is to change the types of food eaten. Do not supplement your present diet with high-fiber foods, but replace foods in your present diet.   INCLUDE A VARIETY OF FIBER SOURCES  Replace refined and processed grains with whole grains, canned fruits with fresh fruits, and incorporate other fiber sources. White rice, white breads, and most bakery goods contain little or no fiber.   Brown whole-grain rice, buckwheat oats, and many fruits and vegetables are all good sources of fiber. These include: broccoli, Brussels sprouts, cabbage, cauliflower, beets, sweet potatoes, white potatoes (skin on), carrots, tomatoes, eggplant, squash, berries, fresh fruits, and dried fruits.   Cereals appear to be the richest source of fiber. Cereal fiber is found in whole grains and bran. Bran is the fiber-rich outer coat of cereal grain, which is largely removed in refining. In whole-grain cereals, the bran remains. In breakfast cereals, the largest amount of fiber is found in those with "bran" in their names. The fiber content is sometimes indicated on the label.   You may  need to include additional fruits and vegetables each day.   In baking, for 1 cup white flour, you may use the following substitutions:   1 cup whole-wheat flour minus 2 tablespoons.   1/2 cup white flour plus 1/2 cup whole-wheat flour.   Polyps, Colon  A polyp is extra tissue that grows inside your body. Colon polyps grow in the large intestine. The large intestine, also called the colon, is part of your digestive system. It is a long, hollow tube at the end of your digestive tract where your body makes and stores stool. Most polyps are not dangerous. They are benign. This means they are not cancerous. But over time, some types of polyps can turn into cancer. Polyps that are smaller than a pea are usually not harmful. But larger polyps could someday become or may already be cancerous. To be safe, doctors remove all polyps and test them.   WHO GETS POLYPS? Anyone can get polyps, but certain people are more likely than others. You  may have a greater chance of getting polyps if:  You are over 50.   You have had polyps before.   Someone in your family has had polyps.   Someone in your family has had cancer of the large intestine.   Find out if someone in your family has had polyps. You may also be more likely to get polyps if you:   Eat a lot of fatty foods   Smoke   Drink alcohol   Do not exercise  Eat too much   PREVENTION There is not one sure way to prevent polyps. You might be able to lower your risk of getting them if you:  Eat more fruits and vegetables and less fatty food.   Do not smoke.   Avoid alcohol.   Exercise every day.   Lose weight if you are overweight.   Eating more calcium and folate can also lower your risk of getting polyps. Some foods that are rich in calcium are milk, cheese, and broccoli. Some foods that are rich in folate are chickpeas, kidney beans, and spinach.   Hemorrhoids Hemorrhoids are dilated (enlarged) veins around the rectum. Sometimes  clots will form in the veins. This makes them swollen and painful. These are called thrombosed hemorrhoids. Causes of hemorrhoids include:  Constipation.   Straining to have a bowel movement.   HEAVY LIFTING  HOME CARE INSTRUCTIONS  Eat a well balanced diet and drink 6 to 8 glasses of water every day to avoid constipation. You may also use a bulk laxative.   Avoid straining to have bowel movements.   Keep anal area dry and clean.   Do not use a donut shaped pillow or sit on the toilet for long periods. This increases blood pooling and pain.   Move your bowels when your body has the urge; this will require less straining and will decrease pain and pressure.

## 2017-03-18 ENCOUNTER — Other Ambulatory Visit: Payer: Self-pay | Admitting: *Deleted

## 2017-03-18 ENCOUNTER — Encounter (HOSPITAL_COMMUNITY): Payer: Self-pay | Admitting: Gastroenterology

## 2017-03-18 ENCOUNTER — Telehealth: Payer: Self-pay | Admitting: *Deleted

## 2017-03-18 DIAGNOSIS — K635 Polyp of colon: Secondary | ICD-10-CM

## 2017-03-18 NOTE — Progress Notes (Signed)
LMOM to call.

## 2017-03-18 NOTE — Telephone Encounter (Signed)
-----   Message from Clarene Essex, Counselor sent at 03/18/2017  2:35 PM EST ----- Seth Bake,  Can you please schedule this patient? See below for the information on why.  Thanks,!  Santiago Glad ----- Message ----- From: Inge Rise, CMA Sent: 03/18/2017   2:26 PM To: Clarene Essex, Counselor  Armandina Stammer. Dr. Oneida Alar is wanting [place] to refer this patient for GENETIC TESTING FOR COLON POLYPOSIS SYNDROMES. Is this something that pt can be scheduled for? Let me know. Thank you so much.

## 2017-03-18 NOTE — Progress Notes (Signed)
Pt is aware of the results and plan. He is not sure if he wants to consider genetic testing for the colon polyposis syndrome. He said his sister had a colonoscopy and only had 2 polyps. He would like more information on it and would be interested in cost, etc.

## 2017-03-18 NOTE — Progress Notes (Signed)
Pt said OK to refer for genetic counseling. He is not sure he will follow through, but he has some questions for them and then he will decide.

## 2017-03-18 NOTE — Progress Notes (Signed)
cc'd to pcp 

## 2017-03-22 ENCOUNTER — Telehealth: Payer: Self-pay | Admitting: Genetic Counselor

## 2017-03-22 NOTE — Telephone Encounter (Signed)
Lft the pt a vm to cb and schedule a genetic counseling appt

## 2017-03-23 ENCOUNTER — Telehealth: Payer: Self-pay | Admitting: *Deleted

## 2017-03-23 NOTE — Telephone Encounter (Signed)
REVIEWED-NO ADDITIONAL RECOMMENDATIONS. 

## 2017-03-23 NOTE — Telephone Encounter (Signed)
Logan Alvarez, Logan Alvarez, [place]!   [name] has cld and said he's not interested in genetic counseling. Also, when entering a referral please make sure you are selecting [facility] for department So that the referral will drop into our WQ. If you need assistance please don't hesitate to send me a message or call me at [phone]. Thank you and have a good day!   Logan Alvarez   ---  Sending to Dr. Oneida Alar so she is aware.

## 2017-04-06 ENCOUNTER — Encounter: Payer: Self-pay | Admitting: Family Medicine

## 2017-04-06 ENCOUNTER — Ambulatory Visit (INDEPENDENT_AMBULATORY_CARE_PROVIDER_SITE_OTHER): Payer: BLUE CROSS/BLUE SHIELD | Admitting: Family Medicine

## 2017-04-06 VITALS — BP 140/90 | Temp 103.1°F | Ht 69.0 in | Wt 236.0 lb

## 2017-04-06 DIAGNOSIS — J111 Influenza due to unidentified influenza virus with other respiratory manifestations: Secondary | ICD-10-CM

## 2017-04-06 MED ORDER — ALBUTEROL SULFATE HFA 108 (90 BASE) MCG/ACT IN AERS: 2.0000 | INHALATION_SPRAY | RESPIRATORY_TRACT | 2 refills | Status: DC | PRN

## 2017-04-06 MED ORDER — OSELTAMIVIR PHOSPHATE 75 MG PO CAPS: 75.0000 mg | ORAL_CAPSULE | Freq: Two times a day (BID) | ORAL | 0 refills | Status: DC

## 2017-04-06 NOTE — Patient Instructions (Signed)

## 2017-04-06 NOTE — Progress Notes (Signed)
   Subjective:    Patient ID: Logan Alvarez, male    DOB: [birth year], 62 y.o.   MRN: [id number]  HPI Patient is here today with a cough,wheezing,headache,sorethroat,fever,abdominal pain started yesterday. He has been taking advil cold and cough medication. Flulike illness over the past 24 hours headache body aches sore throat wheezing denies difficulty breathing denies vomiting diarrhea. Review of Systems  Constitutional: Negative for activity change, chills and fever.  HENT: Positive for congestion and rhinorrhea. Negative for ear pain.   Eyes: Negative for discharge.  Respiratory: Positive for cough. Negative for wheezing.   Cardiovascular: Negative for chest pain.  Gastrointestinal: Negative for nausea and vomiting.  Musculoskeletal: Negative for arthralgias.       Objective:   Physical Exam  Constitutional: He appears well-developed.  HENT:  Head: Normocephalic and atraumatic.  Mouth/Throat: Oropharynx is clear and moist. No oropharyngeal exudate.  Eyes: Right eye exhibits no discharge. Left eye exhibits no discharge.  Neck: Normal range of motion.  Cardiovascular: Normal rate, regular rhythm and normal heart sounds.  No murmur heard. Pulmonary/Chest: Effort normal and breath sounds normal. No respiratory distress. He has no wheezes. He has no rales.  Lymphadenopathy:    He has no cervical adenopathy.  Neurological: He exhibits normal muscle tone.  Skin: Skin is warm and dry.  Nursing note and vitals reviewed. Patient not toxic   I find no evidence of pneumonia or respiratory distress patient was advised to rest up over the next few days he was advised should his symptoms worsen to follow-up immediately currently no x-rays lab work indicated     Assessment & Plan:  Influenza-the patient was diagnosed with influenza. Patient/family educated about the flu and warning signs to watch for. If difficulty breathing, severe neck pain and stiffness, cyanosis, disorientation, or  progressive worsening then immediately get rechecked at that ER. If progressive symptoms be certain to be rechecked. Supportive measures such as Tylenol/ibuprofen was discussed. No aspirin use in children. And influenza home care instruction sheet was given.

## 2017-05-28 ENCOUNTER — Telehealth: Payer: Self-pay | Admitting: Family Medicine

## 2017-05-28 NOTE — Telephone Encounter (Signed)
Review blood work results from [employer] at work in [place].

## 2017-05-30 NOTE — Telephone Encounter (Signed)
Patient has wellness April 29

## 2017-05-31 ENCOUNTER — Ambulatory Visit (INDEPENDENT_AMBULATORY_CARE_PROVIDER_SITE_OTHER): Payer: BLUE CROSS/BLUE SHIELD | Admitting: Family Medicine

## 2017-05-31 VITALS — BP 124/84 | Ht 69.0 in | Wt 236.0 lb

## 2017-05-31 DIAGNOSIS — F419 Anxiety disorder, unspecified: Secondary | ICD-10-CM

## 2017-05-31 DIAGNOSIS — Z125 Encounter for screening for malignant neoplasm of prostate: Secondary | ICD-10-CM | POA: Diagnosis not present

## 2017-05-31 DIAGNOSIS — E782 Mixed hyperlipidemia: Secondary | ICD-10-CM | POA: Diagnosis not present

## 2017-05-31 DIAGNOSIS — Z0001 Encounter for general adult medical examination with abnormal findings: Secondary | ICD-10-CM | POA: Diagnosis not present

## 2017-05-31 DIAGNOSIS — Z0000 Encounter for general adult medical examination without abnormal findings: Secondary | ICD-10-CM

## 2017-05-31 MED ORDER — PRAVASTATIN SODIUM 40 MG PO TABS: 40.0000 mg | ORAL_TABLET | Freq: Every day | ORAL | 5 refills | Status: DC

## 2017-05-31 MED ORDER — BUPROPION HCL ER (SR) 150 MG PO TB12: 150.0000 mg | ORAL_TABLET | Freq: Two times a day (BID) | ORAL | 5 refills | Status: DC

## 2017-05-31 MED ORDER — ALPRAZOLAM 0.5 MG PO TABS: ORAL_TABLET | ORAL | 5 refills | Status: DC

## 2017-05-31 NOTE — Progress Notes (Signed)
Subjective:    Patient ID: Logan Alvarez, male    DOB: 1955-02-14, 63 y.o.   MRN: [id number]  HPI The patient comes in today for a wellness visit.    A review of their health history was completed.  A review of medications was also completed.  Any needed refills; he is in need of refills  Eating habits: Tries to eat somewhat healthy but could eat better uses alcohol more than he should patient was cautioned about this and was encouraged to cut back  Falls/  MVA accidents in past few months: Patient denies any accidents or injuries  Regular exercise: Does not exercise much other than what he does at work  Specialist pt sees on regular basis: Patient does not see any specialist currently  Preventative health issues were discussed.   Additional concerns: He does desire to talk a little bit about his moods anxiousness and cholesterol  His lab work was reviewed and scanned into the system  Patient does take their medication as prescribed.  The patient does have known anxiety condition and is here for follow-up.  Patient states it is necessary to help them function.  Patient denies abusing the medication.  Patient denies negative side effects.  Patient understands the importance of not exceeding recommended amount.  Patient also understands the importance of feeling drugged or drowsy not to drive.  Patient understands to report to [place] if any side effects.  Patient does have symptoms of depression is not suicidal but does find himself depressed he is interested in trying a medication in the past he is tried Cymbalta Zoloft without success  Patient does have allergic rhinitis takes medicine on a regular basis it does help  Patient here for follow-up regarding cholesterol.  Patient does try to maintain a reasonable diet.  Patient does take the medication on a regular basis.  Denies missing a dose.  The patient denies any obvious side effects.  Prior blood work results reviewed with the  patient.  The patient is aware of his cholesterol goals and the need to keep it under good control to lessen the risk of disease.    Review of Systems  Constitutional: Negative for activity change, appetite change and fever.  HENT: Negative for congestion and rhinorrhea.   Eyes: Negative for discharge.  Respiratory: Negative for cough and wheezing.   Cardiovascular: Negative for chest pain.  Gastrointestinal: Negative for abdominal pain, blood in stool and vomiting.  Genitourinary: Negative for difficulty urinating and frequency.  Musculoskeletal: Positive for arthralgias. Negative for neck pain.  Skin: Negative for rash.  Allergic/Immunologic: Negative for environmental allergies and food allergies.  Neurological: Negative for weakness and headaches.  Psychiatric/Behavioral: Negative for agitation, behavioral problems, confusion and decreased concentration.  Patient does relate some right hip pain and discomfort sometimes radiates into the testicle     Objective:   Physical Exam  Constitutional: He appears well-developed and well-nourished.  HENT:  Head: Normocephalic and atraumatic.  Right Ear: External ear normal.  Left Ear: External ear normal.  Nose: Nose normal.  Mouth/Throat: Oropharynx is clear and moist.  Eyes: Pupils are equal, round, and reactive to light. EOM are normal.  Neck: Normal range of motion. Neck supple. No thyromegaly present.  Cardiovascular: Normal rate, regular rhythm and normal heart sounds. Exam reveals no friction rub.  No murmur heard. Pulmonary/Chest: Effort normal and breath sounds normal. No respiratory distress. He has no wheezes.  Abdominal: Soft. Bowel sounds are normal. He exhibits no distension and  no mass. There is no tenderness.  Genitourinary: Prostate normal and penis normal.  Musculoskeletal: Normal range of motion. He exhibits no edema.  Lymphadenopathy:    He has no cervical adenopathy.  Neurological: He is alert. He exhibits normal  muscle tone.  Skin: Skin is warm and dry. No erythema.  Psychiatric: He has a normal mood and affect. His behavior is normal. Judgment normal.          Assessment & Plan:  Adult wellness-complete.wellness physical was conducted today. Importance of diet and exercise were discussed in detail.  In addition to this a discussion regarding safety was also covered. We also reviewed over immunizations and gave recommendations regarding current immunization needed for age.  In addition to this additional areas were also touched on including: Preventative health exams needed: Colonoscopy up-to-date will get one within 3 years of the last one he will want this in [place] according to the patient  Patient was advised yearly wellness exam  Hyperlipidemia subpar control increase medication to 40 mg daily  Patient will need to do PSA  Xanax 3 times a day  Does have mild depression-Not suicidal-patient will give [place] an update in a couple weeks how the medication is doing for him  Does agree to trying Wellbutrin 150 mg SR twice daily Side effects discussed Stop medicine if any side effects Follow-up if any problems

## 2017-06-01 LAB — PSA: PROSTATE SPECIFIC AG, SERUM: 0.8 ng/mL (ref 0.0–4.0)

## 2017-10-18 ENCOUNTER — Ambulatory Visit (INDEPENDENT_AMBULATORY_CARE_PROVIDER_SITE_OTHER): Payer: BLUE CROSS/BLUE SHIELD | Admitting: Family Medicine

## 2017-10-18 ENCOUNTER — Encounter: Payer: Self-pay | Admitting: Family Medicine

## 2017-10-18 VITALS — BP 132/90 | Temp 98.3°F | Ht 69.0 in | Wt 238.1 lb

## 2017-10-18 DIAGNOSIS — K769 Liver disease, unspecified: Secondary | ICD-10-CM

## 2017-10-18 DIAGNOSIS — R1011 Right upper quadrant pain: Secondary | ICD-10-CM

## 2017-10-18 NOTE — Progress Notes (Signed)
   Subjective:    Patient ID: Logan Alvarez, male    DOB: 1955-02-10, 62 y.o.   MRN: [id number]  HPI  Patient is here today with complaints of a gallbladder attack yesterday, tender today at the site. Patient with right upper quadrant pain discomfort does not radiate into the chest does not radiate into the back denies high fever chills sweats does relate some nausea rates pain severe over the weekend not as bad currently denies any other particular troubles.  Patient does have a history of liver infection with sepsis years ago around 1999 Review of Systems  Constitutional: Negative for activity change, appetite change and fatigue.  HENT: Negative for congestion and rhinorrhea.   Respiratory: Negative for cough and shortness of breath.   Cardiovascular: Negative for chest pain and leg swelling.  Gastrointestinal: Positive for abdominal pain. Negative for constipation, diarrhea, nausea and vomiting.  Neurological: Negative for dizziness and headaches.  Psychiatric/Behavioral: Negative for agitation and behavioral problems.       Objective:   Physical Exam  Constitutional: He appears well-nourished. No distress.  HENT:  Head: Normocephalic and atraumatic.  Eyes: Right eye exhibits no discharge. Left eye exhibits no discharge.  Neck: No tracheal deviation present.  Cardiovascular: Normal rate, regular rhythm and normal heart sounds.  No murmur heard. Pulmonary/Chest: Effort normal and breath sounds normal. No respiratory distress.  Musculoskeletal: He exhibits no edema.  Lymphadenopathy:    He has no cervical adenopathy.  Neurological: He is alert. Coordination normal.  Skin: Skin is warm and dry.  Psychiatric: He has a normal mood and affect. His behavior is normal.  Vitals reviewed.  [phone]  Does have tenderness in the right upper quadrant no guarding or rebound     Assessment & Plan:  Abdominal pain Recommend lab work and ultrasound Patient may have gallbladder  dysfunction I doubt liver abscess or sepsis currently

## 2017-10-19 ENCOUNTER — Other Ambulatory Visit: Payer: Self-pay | Admitting: Family Medicine

## 2017-10-19 ENCOUNTER — Telehealth: Payer: Self-pay | Admitting: Family Medicine

## 2017-10-19 ENCOUNTER — Ambulatory Visit (HOSPITAL_COMMUNITY)
Admission: RE | Admit: 2017-10-19 | Discharge: 2017-10-19 | Disposition: A | Discharge status: Home / Self Care | Payer: BLUE CROSS/BLUE SHIELD | Source: Ambulatory Visit | Attending: Family Medicine | Admitting: Family Medicine

## 2017-10-19 DIAGNOSIS — K769 Liver disease, unspecified: Secondary | ICD-10-CM

## 2017-10-19 DIAGNOSIS — R1011 Right upper quadrant pain: Secondary | ICD-10-CM | POA: Insufficient documentation

## 2017-10-19 LAB — CBC WITH DIFFERENTIAL/PLATELET
BASOS: 0 %
Basophils Absolute: 0 10*3/uL (ref 0.0–0.2)
EOS (ABSOLUTE): 0.2 10*3/uL (ref 0.0–0.4)
Eos: 2 %
HEMATOCRIT: 45 % (ref 37.5–51.0)
HEMOGLOBIN: 15.4 g/dL (ref 13.0–17.7)
Immature Grans (Abs): 0 10*3/uL (ref 0.0–0.1)
Immature Granulocytes: 0 %
Lymphocytes Absolute: 1.9 10*3/uL (ref 0.7–3.1)
Lymphs: 22 %
MCH: 31.6 pg (ref 26.6–33.0)
MCHC: 34.2 g/dL (ref 31.5–35.7)
MCV: 92 fL (ref 79–97)
Monocytes Absolute: 1 10*3/uL — ABNORMAL HIGH (ref 0.1–0.9)
Monocytes: 11 %
NEUTROS ABS: 5.6 10*3/uL (ref 1.4–7.0)
NEUTROS PCT: 65 %
Platelets: 266 10*3/uL (ref 150–450)
RBC: 4.88 x10E6/uL (ref 4.14–5.80)
RDW: 12.4 % (ref 12.3–15.4)
WBC: 8.7 10*3/uL (ref 3.4–10.8)

## 2017-10-19 LAB — HEPATIC FUNCTION PANEL
ALT: 22 IU/L (ref 0–44)
AST: 20 IU/L (ref 0–40)
Albumin: 4.6 g/dL (ref 3.6–4.8)
Alkaline Phosphatase: 117 IU/L (ref 39–117)
BILIRUBIN TOTAL: 1.6 mg/dL — AB (ref 0.0–1.2)
BILIRUBIN, DIRECT: 0.37 mg/dL (ref 0.00–0.40)
Total Protein: 7.3 g/dL (ref 6.0–8.5)

## 2017-10-19 LAB — BASIC METABOLIC PANEL
BUN/Creatinine Ratio: 10 (ref 10–24)
BUN: 8 mg/dL (ref 8–27)
CALCIUM: 9.7 mg/dL (ref 8.6–10.2)
CHLORIDE: 100 mmol/L (ref 96–106)
CO2: 26 mmol/L (ref 20–29)
Creatinine, Ser: 0.82 mg/dL (ref 0.76–1.27)
GFR, EST AFRICAN AMERICAN: 109 mL/min/{1.73_m2} (ref >59)
GFR, EST NON AFRICAN AMERICAN: 94 mL/min/{1.73_m2} (ref >59)
Glucose: 97 mg/dL (ref 65–99)
POTASSIUM: 4.3 mmol/L (ref 3.5–5.2)
Sodium: 141 mmol/L (ref 134–144)

## 2017-10-19 LAB — LIPASE: LIPASE: 40 U/L (ref 13–78)

## 2017-10-19 NOTE — Telephone Encounter (Signed)
Please let me speak with patient regarding the lab work and the ultrasound thank you

## 2017-10-19 NOTE — Telephone Encounter (Signed)
Dr Nicki Reaper spoke with pt today

## 2017-10-19 NOTE — Telephone Encounter (Signed)
Please advise 

## 2017-10-19 NOTE — Telephone Encounter (Signed)
Pt came by after having his US done at [facility]. He was hoping Dr. Nicki Reaper or a nurse would be able to discuss results. I explained to pt we didn't have the results yet and a nurse would give him a call as soon as Dr. Nicki Reaper is able to review the lab results and the [place].

## 2017-10-20 NOTE — Addendum Note (Signed)
Addended by: Dairl Ponder on: 10/20/2017 01:28 PM   Modules accepted: Orders

## 2017-10-26 ENCOUNTER — Telehealth: Payer: Self-pay | Admitting: Family Medicine

## 2017-10-26 ENCOUNTER — Ambulatory Visit (HOSPITAL_COMMUNITY)
Admission: RE | Admit: 2017-10-26 | Discharge: 2017-10-26 | Disposition: A | Discharge status: Home / Self Care | Payer: BLUE CROSS/BLUE SHIELD | Source: Ambulatory Visit | Attending: Family Medicine | Admitting: Family Medicine

## 2017-10-26 DIAGNOSIS — K7689 Other specified diseases of liver: Secondary | ICD-10-CM | POA: Diagnosis not present

## 2017-10-26 DIAGNOSIS — K769 Liver disease, unspecified: Secondary | ICD-10-CM | POA: Diagnosis not present

## 2017-10-26 MED ORDER — GADOBUTROL 1 MMOL/ML IV SOLN
10.0000 mL | Freq: Once | INTRAVENOUS | Status: AC | PRN
  Administered 2017-10-26: 10 mL via INTRAVENOUS

## 2017-10-26 NOTE — Telephone Encounter (Signed)
Pt contacted office regarding MRI results. Pt had MRI done this morning and is very anxious of results. Please advice.

## 2017-10-26 NOTE — Telephone Encounter (Signed)
I did discuss this with the patient.  We are going to be connecting with gastroenterology as well as interventional radiology

## 2017-10-27 ENCOUNTER — Encounter: Payer: Self-pay | Admitting: Family Medicine

## 2017-10-27 ENCOUNTER — Other Ambulatory Visit: Payer: Self-pay | Admitting: Family Medicine

## 2017-10-27 DIAGNOSIS — R160 Hepatomegaly, not elsewhere classified: Secondary | ICD-10-CM

## 2017-10-27 NOTE — Telephone Encounter (Signed)
1.-  I discussed the case with Roseanne Kaufman, NP with GI.  They stated that they recommend interventional radiology as I suspected #2-I spoke with interventional radiology [phone] they stated to put in the order for the interventional biopsy They stated this would be in epic Under radiology orders, under ultrasound, under biopsy  Please put in urgent biopsy order Liver mass is the reason After urgent consult is put in, they recommend calling the interventional radiology scheduling team at [phone] Let the team know that the order was put in They will review it with interventional radiologist to make sure it is feasible Then they will call the patient to help set up the biopsy Finally please let the patient know that we did speak with GI and with interventional radiology and that interventional radiology should be connecting with the patient to help get this scheduled It is very important for the patient to be flexible in his schedule so that this can be done hopefully relatively soon The patient has a follow-up appointment with me to discuss in more detail on Friday afternoon at 430

## 2017-10-27 NOTE — Telephone Encounter (Signed)
Order placed in Epic as ULTRASOUND BIOPSY (LIVER) per scheduling team member, Philis Nettle. (called scheduling team because I wasn't sure what to put in). Philis Nettle states that this order will be reviewed by radiologist and will make sure they can do it and will inform patient. This procedure needs to be done at [facility].

## 2017-10-29 ENCOUNTER — Ambulatory Visit: Payer: BLUE CROSS/BLUE SHIELD | Admitting: Family Medicine

## 2017-10-29 ENCOUNTER — Encounter: Payer: Self-pay | Admitting: Family Medicine

## 2017-11-02 ENCOUNTER — Other Ambulatory Visit: Payer: Self-pay | Admitting: Family Medicine

## 2017-11-04 ENCOUNTER — Other Ambulatory Visit: Payer: Self-pay | Admitting: Radiology

## 2017-11-05 ENCOUNTER — Other Ambulatory Visit: Payer: Self-pay | Admitting: Student

## 2017-11-08 ENCOUNTER — Ambulatory Visit (HOSPITAL_COMMUNITY)
Admission: RE | Admit: 2017-11-08 | Discharge: 2017-11-08 | Disposition: A | Discharge status: Home / Self Care | Payer: BLUE CROSS/BLUE SHIELD | Source: Ambulatory Visit | Attending: Family Medicine | Admitting: Family Medicine

## 2017-11-08 ENCOUNTER — Encounter (HOSPITAL_COMMUNITY): Payer: Self-pay

## 2017-11-08 DIAGNOSIS — E785 Hyperlipidemia, unspecified: Secondary | ICD-10-CM | POA: Diagnosis not present

## 2017-11-08 DIAGNOSIS — K7689 Other specified diseases of liver: Secondary | ICD-10-CM | POA: Diagnosis not present

## 2017-11-08 DIAGNOSIS — I10 Essential (primary) hypertension: Secondary | ICD-10-CM | POA: Insufficient documentation

## 2017-11-08 DIAGNOSIS — C220 Liver cell carcinoma: Secondary | ICD-10-CM | POA: Insufficient documentation

## 2017-11-08 DIAGNOSIS — Z8249 Family history of ischemic heart disease and other diseases of the circulatory system: Secondary | ICD-10-CM | POA: Diagnosis not present

## 2017-11-08 DIAGNOSIS — Z888 Allergy status to other drugs, medicaments and biological substances status: Secondary | ICD-10-CM | POA: Insufficient documentation

## 2017-11-08 DIAGNOSIS — Z79899 Other long term (current) drug therapy: Secondary | ICD-10-CM | POA: Diagnosis not present

## 2017-11-08 LAB — PROTIME-INR
INR: 0.93
Prothrombin Time: 12.4 s (ref 11.4–15.2)

## 2017-11-08 LAB — CBC
HEMATOCRIT: 47.1 % (ref 39.0–52.0)
HEMOGLOBIN: 15.5 g/dL (ref 13.0–17.0)
MCH: 30.9 pg (ref 26.0–34.0)
MCHC: 32.9 g/dL (ref 30.0–36.0)
MCV: 93.8 fL (ref 78.0–100.0)
Platelets: 244 10*3/uL (ref 150–400)
RBC: 5.02 MIL/uL (ref 4.22–5.81)
RDW: 13.2 % (ref 11.5–15.5)
WBC: 7.2 10*3/uL (ref 4.0–10.5)

## 2017-11-08 MED ORDER — MIDAZOLAM HCL 2 MG/2ML IJ SOLN
INTRAMUSCULAR | Status: AC
  Filled 2017-11-08: qty 2

## 2017-11-08 MED ORDER — FENTANYL CITRATE (PF) 100 MCG/2ML IJ SOLN
INTRAMUSCULAR | Status: AC | PRN
  Administered 2017-11-08: 50 ug via INTRAVENOUS
  Administered 2017-11-08 (×2): 25 ug via INTRAVENOUS

## 2017-11-08 MED ORDER — GELATIN ABSORBABLE 12-7 MM EX MISC
CUTANEOUS | Status: AC
  Filled 2017-11-08: qty 1

## 2017-11-08 MED ORDER — FENTANYL CITRATE (PF) 100 MCG/2ML IJ SOLN
INTRAMUSCULAR | Status: AC
  Filled 2017-11-08: qty 2

## 2017-11-08 MED ORDER — SODIUM CHLORIDE 0.9 % IV SOLN: INTRAVENOUS | Status: DC

## 2017-11-08 MED ORDER — MIDAZOLAM HCL 2 MG/2ML IJ SOLN
INTRAMUSCULAR | Status: AC | PRN
  Administered 2017-11-08 (×2): 1 mg via INTRAVENOUS

## 2017-11-08 MED ORDER — LIDOCAINE HCL (PF) 1 % IJ SOLN
INTRAMUSCULAR | Status: AC
  Filled 2017-11-08: qty 30

## 2017-11-08 NOTE — Procedures (Signed)
R lobe liver lesion Bx 18 g core times three EBL 0 Comp 0

## 2017-11-08 NOTE — Sedation Documentation (Signed)
Bed Rest start at 0830.

## 2017-11-08 NOTE — H&P (Signed)
Chief Complaint: Patient was seen in consultation today for liver lesion  Referring Physician(s): Grafton A  Supervising Physician: Marybelle Killings  Patient Status: [facility] - Out-pt  History of Present Illness: Logan Alvarez is a 62 y.o. male with past medical history of HTN, HLD who presented to his PCP with RUQ pain in September 2019.  He was sent for Abdominal US which showed: 1.7 x 1.4 x 2.0 cm solid-appearing hepatic nodule with echogenic rim consistent suggesting a calcified rim. This is new from a prior CT of 09/18/2003. This could be of infectious origin, this includes the, conchal infection. Calcified tumor could also present this fashion. Gadolinium-enhanced MRI of the liver should be considered for further evaluation.  This was followed by MR Abdomen 10/26/17 which showed: 3.8 cm indeterminate mass in the inferior right hepatic lobe. Malignancy cannot be excluded. Consider percutaneous needle biopsy for tissue diagnosis.  IR consulted for liver lesion biopsy at the request of Dr. Wolfgang Phoenix.  Case reviewed and approved by Dr. Vernard Gambles.   Patient presents for procedure today in his usual state of health. Denies abdominal pain today.  He has been NPO.  He does not take blood thinners.   Past Medical History:  Diagnosis Date  . Chest pain    a. 2008: Normal ETT;  b. 01/2013 CTA: Ca score of 715 (92%'ile), LAD 50p, D1 50-75, RCA 50p.  Marland Kitchen Hyperlipidemia   . Hypertension   . Testosterone deficiency 2009    Past Surgical History:  Procedure Laterality Date  . COLONOSCOPY N/A 03/12/2017   Procedure: COLONOSCOPY;  Surgeon: Danie Binder, MD;  Location: [facility] SUITE;  Service: Endoscopy;  Laterality: N/A;  2:00  . LEFT HEART CATHETERIZATION WITH CORONARY ANGIOGRAM N/A 02/01/2013   Procedure: LEFT HEART CATHETERIZATION WITH CORONARY ANGIOGRAM;  Surgeon: Josue Hector, MD;  Location: [facility] CATH LAB;  Service: Cardiovascular;  Laterality: N/A;  . NECK SURGERY       Allergies: Cymbalta [duloxetine hcl]; Lodine [etodolac]; and Zoloft [sertraline hcl]  Medications: Prior to Admission medications   Medication Sig Start Date End Date Taking? Authorizing Provider  ALPRAZolam (XANAX) 0.5 MG tablet ONE TID PRN Patient taking differently: Take 0.5 mg by mouth 3 (three) times daily as needed for anxiety. ONE TID PRN 05/31/17  Yes Luking, Elayne Snare, MD  pravastatin (PRAVACHOL) 40 MG tablet TAKE 1 TABLET BY MOUTH ONCE A DAY. 11/02/17  Yes Luking, Elayne Snare, MD  albuterol (PROVENTIL HFA;VENTOLIN HFA) 108 (90 Base) MCG/ACT inhaler Inhale 2 puffs into the lungs every 4 (four) hours as needed for wheezing. 04/06/17   Kathyrn Drown, MD  ibuprofen (ADVIL,MOTRIN) 200 MG tablet Take 400 mg by mouth every 8 (eight) hours as needed (for pain.).    [provider]     Family History  Problem Relation Age of Onset  . Heart attack Father        71's    Social History   Socioeconomic History  . Marital status: Widowed    Spouse name: Not on file  . Number of children: Not on file  . Years of education: Not on file  . Highest education level: Not on file  Occupational History  . Not on file  Social Needs  . Financial resource strain: Not on file  . Food insecurity:    Worry: Not on file    Inability: Not on file  . Transportation needs:    Medical: Not on file    Non-medical: Not on file  Tobacco Use  . Smoking status: Never Smoker  . Smokeless tobacco: Never Used  Substance and Sexual Activity  . Alcohol use: Yes    Comment: 4-6 beers a day  . Drug use: No  . Sexual activity: Not on file  Lifestyle  . Physical activity:    Days per week: Not on file    Minutes per session: Not on file  . Stress: Not on file  Relationships  . Social connections:    Talks on phone: Not on file    Gets together: Not on file    Attends religious service: Not on file    Active member of club or organization: Not on file    Attends meetings of clubs or  organizations: Not on file    Relationship status: Not on file  Other Topics Concern  . Not on file  Social History Narrative  . Not on file     Review of Systems: A 12 point ROS discussed and pertinent positives are indicated in the HPI above.  All other systems are negative.  Review of Systems  Constitutional: Negative for fatigue and fever.  Respiratory: Negative for cough and shortness of breath.   Cardiovascular: Negative for chest pain.  Gastrointestinal: Negative for abdominal pain, nausea and vomiting.  Genitourinary: Negative for dysuria.  Musculoskeletal: Negative for back pain.  Psychiatric/Behavioral: Negative for behavioral problems and confusion.    Vital Signs: BP (!) 128/97 (BP Location: Left Arm)   Pulse 74   Temp 97.9 F (36.6 C) (Oral)   Resp 16   Ht 5\' 9"  (1.753 m)   Wt 235 lb (106.6 kg)   SpO2 100%   BMI 34.70 kg/m   Physical Exam  Constitutional: He is oriented to person, place, and time. He appears well-developed. No distress.  Neck: Normal range of motion. Neck supple.  Cardiovascular: Normal rate, regular rhythm and normal heart sounds. Exam reveals no gallop and no friction rub.  No murmur heard. Pulmonary/Chest: Effort normal and breath sounds normal. No respiratory distress.  Abdominal: Soft. He exhibits no distension. There is no tenderness.  Neurological: He is alert and oriented to person, place, and time.  Skin: Skin is warm and dry. He is not diaphoretic.  Psychiatric: He has a normal mood and affect. His behavior is normal. Judgment and thought content normal.  Nursing note and vitals reviewed.    MD Evaluation Airway: WNL Heart: WNL Abdomen: WNL Chest/ Lungs: WNL ASA  Classification: 2 Mallampati/Airway Score: Two   Imaging: Mr Abdomen W Wo Contrast  Result Date: 10/26/2017 CLINICAL DATA:  Right upper quadrant pain. Indeterminate liver lesion on recent ultrasound. EXAM: MRI ABDOMEN WITHOUT AND WITH CONTRAST TECHNIQUE:  Multiplanar multisequence MR imaging of the abdomen was performed both before and after the administration of intravenous contrast. CONTRAST:  10 mL Gadavist COMPARISON:  Ultrasound on 10/19/2017 FINDINGS: Lower chest: No acute findings. Hepatobiliary: A few tiny hepatic cysts are seen, largest in the posterior right hepatic lobe measuring 1.4 cm. There is also a heterogeneously enhancing mass in the inferior right hepatic lobe measuring 3.8 x 3.1 cm on image 48/5009. This lesion shows mild heterogeneous T2 hyperintensity. No other solid liver masses identified. Gallbladder is unremarkable. No evidence of biliary ductal dilatation. Pancreas:  No mass or inflammatory changes. Spleen:  Within normal limits in size and appearance. Adrenals/Urinary Tract: No masses identified. Small cyst noted in lower pole of right kidney. No evidence of hydronephrosis. Stomach/Bowel: Visualized portion unremarkable. Vascular/Lymphatic: No pathologically enlarged lymph  nodes identified. No abdominal aortic aneurysm. Other:  None. Musculoskeletal:  No suspicious bone lesions identified. IMPRESSION: 3.8 cm indeterminate mass in the inferior right hepatic lobe. Malignancy cannot be excluded. Consider percutaneous needle biopsy for tissue diagnosis. Electronically Signed   By: Earle Gell M.D.   On: 10/26/2017 10:34   US Abdomen Limited Ruq  Result Date: 10/19/2017 CLINICAL DATA:  Right upper quadrant pain. EXAM: ULTRASOUND ABDOMEN LIMITED RIGHT UPPER QUADRANT COMPARISON:  CT 09/18/2003. FINDINGS: Gallbladder: No gallstones or wall thickening visualized. No sonographic Murphy sign noted by sonographer. Common bile duct: Diameter: 5.0 mm Liver: 1.7 x 1.4 x 2.0 cm solid-appearing nodule with a echogenic rim consistent suggest calcified rim. This is new from prior CT of 09/18/2003. Portal vein is patent on color Doppler imaging with normal direction of blood flow towards the liver. IMPRESSION: 1.7 x 1.4 x 2.0 cm solid-appearing hepatic  nodule with echogenic rim consistent suggesting a calcified rim. This is new from a prior CT of 09/18/2003. This could be of infectious origin, this includes the, conchal infection. Calcified tumor could also present this fashion. Gadolinium-enhanced MRI of the liver should be considered for further evaluation. Electronically Signed   By: Marcello Moores  Register   On: 10/19/2017 09:31    Labs:  CBC: Recent Labs    10/18/17 1638 11/08/17 0640  WBC 8.7 7.2  HGB 15.4 15.5  HCT 45.0 47.1  PLT 266 244    COAGS: Recent Labs    11/08/17 0640  INR 0.93    BMP: Recent Labs    01/28/17 0946 10/18/17 1638  NA  --  141  K  --  4.3  CL  --  100  CO2  --  26  GLUCOSE 103* 97  BUN  --  8  CALCIUM  --  9.7  CREATININE  --  0.82  GFRNONAA  --  94  GFRAA  --  109    LIVER FUNCTION TESTS: Recent Labs    01/28/17 0946 10/18/17 1638  BILITOT 1.2 1.6*  AST 23 20  ALT 26 22  ALKPHOS 113 117  PROT 7.4 7.3  ALBUMIN 4.4 4.6    TUMOR MARKERS: No results for input(s): AFPTM, CEA, CA199, CHROMGRNA in the last 8760 hours.  Assessment and Plan: Patient with past medical history of HLD, HTN presents with complaint of recent findings of indeterminate liver lesion.  IR consulted for liver lesion biopsy at the request of Dr. Wolfgang Phoenix. Case reviewed by Dr. Vernard Gambles who approves patient for procedure.  Patient presents today in their usual state of health.  He has been NPO and is not currently on blood thinners.    Risks and benefits discussed with the patient including, but not limited to bleeding, infection, damage to adjacent structures or low yield requiring additional tests.  All of the patient's questions were answered, patient is agreeable to proceed. Consent signed and in chart.  Thank you for this interesting consult.  I greatly enjoyed meeting LIANDRO THELIN and look forward to participating in their care.  A copy of this report was sent to the requesting provider on this  date.  Electronically Signed: Docia Barrier, PA 11/08/2017, 8:20 AM   I spent a total of  30 Minutes   in face to face in clinical consultation, greater than 50% of which was counseling/coordinating care for liver lesion.

## 2017-11-08 NOTE — Discharge Instructions (Signed)
Liver Biopsy, Care After °These instructions give you information on caring for yourself after your procedure. Your doctor may also give you more specific instructions. Call your doctor if you have any problems or questions after your procedure. °Follow these instructions at home: °· Rest at home for 1-2 days or as told by your doctor. °· Have someone stay with you for at least 24 hours. °· Do not do these things in the first 24 hours: °? Drive. °? Use machinery. °? Take care of other people. °? Sign legal documents. °? Take a bath or shower. °· There are many different ways to close and cover a cut (incision). For example, a cut can be closed with stitches, skin glue, or adhesive strips. Follow your doctor's instructions on: °? Taking care of your cut. °? Changing and removing your bandage (dressing). °? Removing whatever was used to close your cut. °· Do not drink alcohol in the first week. °· Do not lift more than 5 pounds or play contact sports for the first 2 weeks. °· Take medicines only as told by your doctor. For 1 week, do not take medicine that has aspirin in it or medicines like ibuprofen. °· Get your test results. °Contact a doctor if: °· A cut bleeds and leaves more than just a small spot of blood. °· A cut is red, puffs up (swells), or hurts more than before. °· Fluid or something else comes from a cut. °· A cut smells bad. °· You have a fever or chills. °Get help right away if: °· You have swelling, bloating, or pain in your belly (abdomen). °· You get dizzy or faint. °· You have a rash. °· You feel sick to your stomach (nauseous) or throw up (vomit). °· You have trouble breathing, feel short of breath, or feel faint. °· Your chest hurts. °· You have problems talking or seeing. °· You have trouble balancing or moving your arms or legs. °This information is not intended to replace advice given to you by your health care provider. Make sure you discuss any questions you have with your health care  provider. °Document Released: 10/29/2007 Document Revised: 06/27/2015 Document Reviewed: 03/17/2013 °Elsevier Interactive Patient Education © 2018 Elsevier Inc. ° ° ° ° °Moderate Conscious Sedation, Adult, Care After °These instructions provide you with information about caring for yourself after your procedure. Your health care provider may also give you more specific instructions. Your treatment has been planned according to current medical practices, but problems sometimes occur. Call your health care provider if you have any problems or questions after your procedure. °What can I expect after the procedure? °After your procedure, it is common: °· To feel sleepy for several hours. °· To feel clumsy and have poor balance for several hours. °· To have poor judgment for several hours. °· To vomit if you eat too soon. ° °Follow these instructions at home: °For at least 24 hours after the procedure: ° °· Do not: °? Participate in activities where you could fall or become injured. °? Drive. °? Use heavy machinery. °? Drink alcohol. °? Take sleeping pills or medicines that cause drowsiness. °? Make important decisions or sign legal documents. °? Take care of children on your own. °· Rest. °Eating and drinking °· Follow the diet recommended by your health care provider. °· If you vomit: °? Drink water, juice, or soup when you can drink without vomiting. °? Make sure you have little or no nausea before eating solid foods. °General instructions °·   Have a responsible adult stay with you until you are awake and alert. °· Take over-the-counter and prescription medicines only as told by your health care provider. °· If you smoke, do not smoke without supervision. °· Keep all follow-up visits as told by your health care provider. This is important. °Contact a health care provider if: °· You keep feeling nauseous or you keep vomiting. °· You feel light-headed. °· You develop a rash. °· You have a fever. °Get help right away  if: °· You have trouble breathing. °This information is not intended to replace advice given to you by your health care provider. Make sure you discuss any questions you have with your health care provider. °Document Released: 11/09/2012 Document Revised: 06/24/2015 Document Reviewed: 05/11/2015 °Elsevier Interactive Patient Education © 2018 Elsevier Inc. ° ° °

## 2017-11-11 ENCOUNTER — Encounter: Payer: Self-pay | Admitting: Family Medicine

## 2017-11-12 ENCOUNTER — Telehealth: Payer: Self-pay | Admitting: Family Medicine

## 2017-11-12 ENCOUNTER — Ambulatory Visit (INDEPENDENT_AMBULATORY_CARE_PROVIDER_SITE_OTHER): Payer: BLUE CROSS/BLUE SHIELD | Admitting: Family Medicine

## 2017-11-12 DIAGNOSIS — C220 Liver cell carcinoma: Secondary | ICD-10-CM | POA: Insufficient documentation

## 2017-11-12 MED ORDER — CITALOPRAM HYDROBROMIDE 10 MG PO TABS: 10.0000 mg | ORAL_TABLET | Freq: Every day | ORAL | 3 refills | Status: DC

## 2017-11-12 NOTE — Progress Notes (Signed)
   Subjective:    Patient ID: Logan Alvarez, male    DOB: July 19, 1955, 62 y.o.   MRN: [id number]  HPIpt arrives to follow up on test results.  Very nice patient We were called this afternoon from the pathologist about the results of his test Hepatocellular carcinoma are noted Patient very worried about this He is here today with his daughter Understandably he has multiple questions about this Understandably he is very stressed about this We talked at length about the diagnosis we talked at length about how this would be best served by being treated at a tertiary care center we also talked about how it would be very important for the patient to make sure he has family supporting him on this and going with him to the appointments   Review of Systems     Objective:   Physical Exam He does relate he is anxious he was asking for increase in his Xanax we also discussed how it is very important to avoid alcohol while on this medicine       Assessment & Plan:  Hepatocellular carcinoma We will discuss with oncology regarding this patient More than likely will need advanced treatments  Hopefully this can be resected  Patient having significant anxiety related issues very understandable I recommend Celexa 10 mg 1 daily Half tablet daily for the first week Then 1 tablet thereafter  Xanax he may use 1 in the morning 1 midafternoon may also used two in the evenings we will send in additional  Follow-up here based upon everything else

## 2017-11-12 NOTE — Telephone Encounter (Signed)
results

## 2017-11-15 ENCOUNTER — Telehealth: Payer: Self-pay | Admitting: Family Medicine

## 2017-11-15 NOTE — Telephone Encounter (Signed)
The patient has requested that this information be sent to him via my chart message  In addition to this please have Danae Chen print off his last 2 months of office visit notes as well as labs that were done within the past month as well as ultrasound and MRI and biopsy result so that the patient will have a copy of this to take with him when he sees a specialist at [facility]

## 2017-11-15 NOTE — Telephone Encounter (Signed)
Pt is scheduled to see Dr. Leslie Andrea @ [place] 11/23/17 @ 2:00pm, they will mail him an appointment information packet with all information needed.    (wasn't sure if you wanted to notify pt or if you wanted me to - please advise)   Ph# [phone], Fx# [phone]

## 2017-11-15 NOTE — Telephone Encounter (Signed)
Pt has [place], both [place] are participating providers with [facility].  We could refer to either

## 2017-11-16 NOTE — Telephone Encounter (Signed)
Scheduled & notified pt

## 2017-11-16 NOTE — Telephone Encounter (Signed)
Order put in to be precerted and scheduled. Dr. Nicki Reaper would like test this week and pt is aware he needs scan this week.

## 2017-11-16 NOTE — Telephone Encounter (Signed)
I did discuss the case with radiology I spoke with the radiologist Also discussed the case with [facility] oncology This patient has hepatocellular carcinoma  Nurses-please order CT scan of the chest with contrast This is being done because of his hepatocellular carcinoma and to make sure that he does not have metastatic disease in his lungs This CAT scan needs to be completed this week I did discuss this with Izora Ribas who can help assist with getting it approved if you need her help The patient is aware that he will have a CAT scan done this week Please explain to [facility] that we need to get this done this week because the patient will be seeing the specialist at the start of the week

## 2017-11-16 NOTE — Telephone Encounter (Signed)
Sent pt MyChart message about appointment & to inform that records are ready up front for pick up

## 2017-11-17 ENCOUNTER — Encounter: Payer: Self-pay | Admitting: Family Medicine

## 2017-11-18 ENCOUNTER — Ambulatory Visit (HOSPITAL_COMMUNITY)
Admission: RE | Admit: 2017-11-18 | Discharge: 2017-11-18 | Disposition: A | Discharge status: Home / Self Care | Payer: BLUE CROSS/BLUE SHIELD | Source: Ambulatory Visit | Attending: Family Medicine | Admitting: Family Medicine

## 2017-11-18 DIAGNOSIS — R918 Other nonspecific abnormal finding of lung field: Secondary | ICD-10-CM | POA: Insufficient documentation

## 2017-11-18 DIAGNOSIS — I2510 Atherosclerotic heart disease of native coronary artery without angina pectoris: Secondary | ICD-10-CM | POA: Diagnosis not present

## 2017-11-18 DIAGNOSIS — I700 Atherosclerosis of aorta: Secondary | ICD-10-CM | POA: Diagnosis not present

## 2017-11-18 DIAGNOSIS — C220 Liver cell carcinoma: Secondary | ICD-10-CM | POA: Insufficient documentation

## 2017-11-18 MED ORDER — IOHEXOL 300 MG/ML  SOLN
75.0000 mL | Freq: Once | INTRAMUSCULAR | Status: AC | PRN
  Administered 2017-11-18: 75 mL via INTRAVENOUS

## 2017-11-19 ENCOUNTER — Encounter: Payer: Self-pay | Admitting: Family Medicine

## 2017-11-19 ENCOUNTER — Ambulatory Visit: Payer: BLUE CROSS/BLUE SHIELD | Admitting: Family Medicine

## 2017-11-22 ENCOUNTER — Telehealth: Payer: Self-pay | Admitting: Family Medicine

## 2017-11-22 NOTE — Telephone Encounter (Signed)
error 

## 2017-11-23 ENCOUNTER — Other Ambulatory Visit: Payer: Self-pay | Admitting: Family Medicine

## 2017-11-23 ENCOUNTER — Encounter: Payer: Self-pay | Admitting: Family Medicine

## 2017-11-23 DIAGNOSIS — C220 Liver cell carcinoma: Secondary | ICD-10-CM | POA: Diagnosis not present

## 2017-11-23 MED ORDER — ALPRAZOLAM 0.5 MG PO TABS: ORAL_TABLET | ORAL | 4 refills | Status: DC

## 2017-11-25 ENCOUNTER — Encounter: Payer: Self-pay | Admitting: Family Medicine

## 2017-11-25 NOTE — Telephone Encounter (Signed)
Yehudah-we will put together this.  You can pick this up by Monday.  Also when you see the specialist or any doctor or nurse at [facility].  If they are interested in any of your recent records over the past several years-everything that we do is in [place] which is the same medical system they use.  They can open up your record from [place] as well as [facility] and [facility] by using [facility] care everywhere feature. I just have a feeling that many of the specialists over that way seemed to think that we are not on a electronic medical record system they can see So therefore if you point this out to them that would be very helpful and it would prevent you from having to print information-thanks-Vermelle Cammarata  Nurses please print off recent OV,labs,EKG for patient to pick up Please send above message to him

## 2017-12-03 DIAGNOSIS — C220 Liver cell carcinoma: Secondary | ICD-10-CM | POA: Diagnosis not present

## 2017-12-03 DIAGNOSIS — I10 Essential (primary) hypertension: Secondary | ICD-10-CM | POA: Diagnosis not present

## 2017-12-03 DIAGNOSIS — E782 Mixed hyperlipidemia: Secondary | ICD-10-CM | POA: Diagnosis not present

## 2017-12-03 DIAGNOSIS — Z01818 Encounter for other preprocedural examination: Secondary | ICD-10-CM | POA: Diagnosis not present

## 2017-12-08 DIAGNOSIS — K811 Chronic cholecystitis: Secondary | ICD-10-CM | POA: Diagnosis not present

## 2017-12-08 DIAGNOSIS — Q446 Cystic disease of liver: Secondary | ICD-10-CM | POA: Diagnosis not present

## 2017-12-08 DIAGNOSIS — Z888 Allergy status to other drugs, medicaments and biological substances status: Secondary | ICD-10-CM | POA: Diagnosis not present

## 2017-12-08 DIAGNOSIS — I10 Essential (primary) hypertension: Secondary | ICD-10-CM | POA: Diagnosis not present

## 2017-12-08 DIAGNOSIS — E785 Hyperlipidemia, unspecified: Secondary | ICD-10-CM | POA: Diagnosis not present

## 2017-12-08 DIAGNOSIS — Q859 Phakomatosis, unspecified: Secondary | ICD-10-CM | POA: Diagnosis not present

## 2017-12-08 DIAGNOSIS — F419 Anxiety disorder, unspecified: Secondary | ICD-10-CM | POA: Diagnosis not present

## 2017-12-08 DIAGNOSIS — Z981 Arthrodesis status: Secondary | ICD-10-CM | POA: Diagnosis not present

## 2017-12-08 DIAGNOSIS — Z79899 Other long term (current) drug therapy: Secondary | ICD-10-CM | POA: Diagnosis not present

## 2017-12-08 DIAGNOSIS — F329 Major depressive disorder, single episode, unspecified: Secondary | ICD-10-CM | POA: Diagnosis not present

## 2017-12-08 DIAGNOSIS — C220 Liver cell carcinoma: Secondary | ICD-10-CM | POA: Diagnosis not present

## 2017-12-08 DIAGNOSIS — G8918 Other acute postprocedural pain: Secondary | ICD-10-CM | POA: Diagnosis not present

## 2017-12-09 DIAGNOSIS — G8918 Other acute postprocedural pain: Secondary | ICD-10-CM | POA: Diagnosis not present

## 2017-12-10 DIAGNOSIS — G8918 Other acute postprocedural pain: Secondary | ICD-10-CM | POA: Diagnosis not present

## 2017-12-11 DIAGNOSIS — G8918 Other acute postprocedural pain: Secondary | ICD-10-CM | POA: Diagnosis not present

## 2017-12-12 MED ORDER — GUAIFENESIN ER 600 MG PO TB12
600.00 | ORAL_TABLET | ORAL | Status: DC
Start: ? — End: 2017-12-12

## 2017-12-12 MED ORDER — OXYCODONE HCL 5 MG PO TABS
5.00 | ORAL_TABLET | ORAL | Status: DC
Start: ? — End: 2017-12-12

## 2017-12-12 MED ORDER — ALPRAZOLAM 0.25 MG PO TABS
0.25 | ORAL_TABLET | ORAL | Status: DC
Start: 2017-12-12 — End: 2017-12-12

## 2017-12-12 MED ORDER — ENOXAPARIN SODIUM 40 MG/0.4ML ~~LOC~~ SOLN
40.00 | SUBCUTANEOUS | Status: DC
Start: 2017-12-13 — End: 2017-12-12

## 2017-12-12 MED ORDER — ACETAMINOPHEN 325 MG PO TABS
975.00 | ORAL_TABLET | ORAL | Status: DC
Start: 2017-12-12 — End: 2017-12-12

## 2017-12-12 MED ORDER — NAPROXEN 500 MG PO TABS
500.00 | ORAL_TABLET | ORAL | Status: DC
Start: 2017-12-12 — End: 2017-12-12

## 2017-12-12 MED ORDER — TAMSULOSIN HCL 0.4 MG PO CAPS
0.40 | ORAL_CAPSULE | ORAL | Status: DC
Start: 2017-12-13 — End: 2017-12-12

## 2017-12-12 MED ORDER — ONDANSETRON HCL 4 MG/2ML IJ SOLN
4.00 | INTRAMUSCULAR | Status: DC
Start: ? — End: 2017-12-12

## 2017-12-12 MED ORDER — TRAMADOL HCL 50 MG PO TABS
25.00 | ORAL_TABLET | ORAL | Status: DC
Start: ? — End: 2017-12-12

## 2017-12-12 MED ORDER — GENERIC EXTERNAL MEDICATION
6.25 | Status: DC
Start: ? — End: 2017-12-12

## 2017-12-12 MED ORDER — CITALOPRAM HYDROBROMIDE 20 MG PO TABS
10.00 | ORAL_TABLET | ORAL | Status: DC
Start: 2017-12-13 — End: 2017-12-12

## 2017-12-21 DIAGNOSIS — R197 Diarrhea, unspecified: Secondary | ICD-10-CM | POA: Diagnosis not present

## 2017-12-21 DIAGNOSIS — C220 Liver cell carcinoma: Secondary | ICD-10-CM | POA: Diagnosis not present

## 2018-01-01 ENCOUNTER — Other Ambulatory Visit: Payer: Self-pay | Admitting: Family Medicine

## 2018-01-07 DIAGNOSIS — C220 Liver cell carcinoma: Secondary | ICD-10-CM | POA: Diagnosis not present

## 2018-02-01 ENCOUNTER — Other Ambulatory Visit: Payer: Self-pay | Admitting: Family Medicine

## 2018-02-01 NOTE — Telephone Encounter (Signed)
May have this with 2 refills needs follow-up office visit

## 2018-02-02 DIAGNOSIS — C801 Malignant (primary) neoplasm, unspecified: Secondary | ICD-10-CM

## 2018-02-02 HISTORY — PX: OTHER SURGICAL HISTORY: SHX169

## 2018-02-02 HISTORY — DX: Malignant (primary) neoplasm, unspecified: C80.1

## 2018-03-04 ENCOUNTER — Other Ambulatory Visit: Payer: Self-pay | Admitting: Nurse Practitioner

## 2018-03-04 DIAGNOSIS — C220 Liver cell carcinoma: Secondary | ICD-10-CM

## 2018-03-10 ENCOUNTER — Other Ambulatory Visit: Payer: Self-pay | Admitting: Family Medicine

## 2018-03-11 ENCOUNTER — Telehealth: Payer: Self-pay | Admitting: Family Medicine

## 2018-03-11 NOTE — Telephone Encounter (Signed)
Last labs ordered by dr scott 10/18/17 bmp, liver, lipase, cbc

## 2018-03-11 NOTE — Telephone Encounter (Signed)
Wellness with Dr.Scott 06/02/18, requesting blood work.

## 2018-03-13 NOTE — Telephone Encounter (Signed)
Lipid, liver, met 7, PSA, CBC 

## 2018-03-14 ENCOUNTER — Other Ambulatory Visit: Payer: Self-pay | Admitting: Family Medicine

## 2018-03-14 DIAGNOSIS — Z79899 Other long term (current) drug therapy: Secondary | ICD-10-CM

## 2018-03-14 DIAGNOSIS — Z125 Encounter for screening for malignant neoplasm of prostate: Secondary | ICD-10-CM

## 2018-03-14 DIAGNOSIS — Z0000 Encounter for general adult medical examination without abnormal findings: Secondary | ICD-10-CM

## 2018-03-14 DIAGNOSIS — I10 Essential (primary) hypertension: Secondary | ICD-10-CM

## 2018-03-14 DIAGNOSIS — E782 Mixed hyperlipidemia: Secondary | ICD-10-CM

## 2018-03-14 NOTE — Telephone Encounter (Signed)
Lab orders placed and pt contacted. Pt verbalized understanding.

## 2018-04-04 ENCOUNTER — Other Ambulatory Visit: Payer: Self-pay | Admitting: Family Medicine

## 2018-04-04 NOTE — Telephone Encounter (Signed)
1 refill needs office visit 

## 2018-04-11 ENCOUNTER — Other Ambulatory Visit: Payer: Self-pay | Admitting: Nurse Practitioner

## 2018-04-12 ENCOUNTER — Other Ambulatory Visit: Payer: Self-pay | Admitting: Nurse Practitioner

## 2018-04-12 ENCOUNTER — Inpatient Hospital Stay: Admission: RE | Admit: 2018-04-12 | Payer: BLUE CROSS/BLUE SHIELD | Source: Ambulatory Visit

## 2018-04-12 ENCOUNTER — Encounter: Payer: Self-pay | Admitting: Family Medicine

## 2018-04-12 NOTE — Telephone Encounter (Signed)
This would require office visit (documentation purposes to in order to get the insurance to cover it etc.) ideally if the patient can bring the order for the scan otherwise we will look at care everywhere when the patient comes

## 2018-04-17 ENCOUNTER — Ambulatory Visit
Admission: RE | Admit: 2018-04-17 | Discharge: 2018-04-17 | Disposition: A | Discharge status: Home / Self Care | Payer: BLUE CROSS/BLUE SHIELD | Source: Ambulatory Visit | Attending: Nurse Practitioner | Admitting: Nurse Practitioner

## 2018-04-17 ENCOUNTER — Other Ambulatory Visit: Payer: Self-pay

## 2018-04-17 DIAGNOSIS — C220 Liver cell carcinoma: Secondary | ICD-10-CM

## 2018-04-17 DIAGNOSIS — K7689 Other specified diseases of liver: Secondary | ICD-10-CM | POA: Diagnosis not present

## 2018-04-17 MED ORDER — GADOBUTROL 1 MMOL/ML IV SOLN
10.0000 mL | Freq: Once | INTRAVENOUS | Status: AC | PRN
  Administered 2018-04-17: 10 mL via INTRAVENOUS

## 2018-04-19 DIAGNOSIS — C220 Liver cell carcinoma: Secondary | ICD-10-CM | POA: Diagnosis not present

## 2018-05-16 ENCOUNTER — Other Ambulatory Visit: Payer: Self-pay

## 2018-05-16 ENCOUNTER — Encounter: Payer: Self-pay | Admitting: Family Medicine

## 2018-05-16 ENCOUNTER — Ambulatory Visit (INDEPENDENT_AMBULATORY_CARE_PROVIDER_SITE_OTHER): Payer: BLUE CROSS/BLUE SHIELD | Admitting: Family Medicine

## 2018-05-16 VITALS — Wt 225.0 lb

## 2018-05-16 DIAGNOSIS — R17 Unspecified jaundice: Secondary | ICD-10-CM

## 2018-05-16 DIAGNOSIS — F411 Generalized anxiety disorder: Secondary | ICD-10-CM | POA: Diagnosis not present

## 2018-05-16 DIAGNOSIS — M791 Myalgia, unspecified site: Secondary | ICD-10-CM | POA: Diagnosis not present

## 2018-05-16 DIAGNOSIS — M2550 Pain in unspecified joint: Secondary | ICD-10-CM | POA: Diagnosis not present

## 2018-05-16 DIAGNOSIS — Z125 Encounter for screening for malignant neoplasm of prostate: Secondary | ICD-10-CM

## 2018-05-16 MED ORDER — ALPRAZOLAM 0.5 MG PO TABS: ORAL_TABLET | ORAL | 5 refills | Status: DC

## 2018-05-16 NOTE — Progress Notes (Signed)
   Subjective:    Patient ID: Logan Alvarez, male    DOB: 04-20-1955, 63 y.o.   MRN: [id number]  HPI Pt states this has been going on for several months. Started in thumbs; moved into hips, legs, knees. Laying in bed makes pain worse. Left knee feels "damaged". Went to see oncologist about a month ago and everything looks good and he is cancer free and they advised him to see PCP. Oncologist has done bloodwork and pts had them pulled up for review while on the phone with provider. Pt has been taking Ibuprofen with mild relief. Pt states that he would be stiff and sore when he got up and has day went on he began to get better. Nagging pain would go away but over the past few days, they have become worse.  He describes a lot of soreness achiness in the muscles also relates it in the joints as well.  He was wondering if it is possible pravastatin is causing all of his problems or if it is possibly a rheumatologic issue sending in Virtual Visit via Video Note  I connected with Logan Alvarez on 05/16/18 at  3:00 PM EDT by a video enabled telemedicine application and verified that I am speaking with the correct person using two identifiers.   I discussed the limitations of evaluation and management by telemedicine and the availability of in person appointments. The patient expressed understanding and agreed to proceed.  History of Present Illness:    Observations/Objective:   Assessment and Plan:   Follow Up Instructions:    I discussed the assessment and treatment plan with the patient. The patient was provided an opportunity to ask questions and all were answered. The patient agreed with the plan and demonstrated an understanding of the instructions.   The patient was advised to call back or seek an in-person evaluation if the symptoms worsen or if the condition fails to improve as anticipated.  I provided 15 minutes of non-face-to-face time during this encounter.   Vicente Males, LPN   Review of Systems     Objective:   Physical Exam        Assessment & Plan:  Significant myalgia and arthralgia more than likely related to possibly some of the treatments he has been through her other measures but it could be related to his cholesterol medicine he was advised to stop cholesterol medicine for at least 3 weeks and see if it gets better if it does not get better then possibly something else is going on Lab work is recommended In addition this generalized anxiety overall doing fairly well he was encouraged to stay away from alcohol he may use the Xanax he has had a longstanding history of anxiousness he is also to continue the Celexa. Follow-up office visit within 4 to 5 months sooner if any problems may need rheumatology consultation depending on the results of the tests

## 2018-05-16 NOTE — Telephone Encounter (Signed)
Called pt and scheduled him a virtual appt for today.

## 2018-05-17 DIAGNOSIS — R17 Unspecified jaundice: Secondary | ICD-10-CM | POA: Diagnosis not present

## 2018-05-17 DIAGNOSIS — M791 Myalgia, unspecified site: Secondary | ICD-10-CM | POA: Diagnosis not present

## 2018-05-17 DIAGNOSIS — M2550 Pain in unspecified joint: Secondary | ICD-10-CM | POA: Diagnosis not present

## 2018-05-17 DIAGNOSIS — Z125 Encounter for screening for malignant neoplasm of prostate: Secondary | ICD-10-CM | POA: Diagnosis not present

## 2018-05-18 LAB — HEPATIC FUNCTION PANEL
ALT: 28 IU/L (ref 0–44)
AST: 20 IU/L (ref 0–40)
Albumin: 4.6 g/dL (ref 3.8–4.8)
Alkaline Phosphatase: 93 IU/L (ref 39–117)
Bilirubin Total: 1.5 mg/dL — ABNORMAL HIGH (ref 0.0–1.2)
Bilirubin, Direct: 0.32 mg/dL (ref 0.00–0.40)
Total Protein: 7 g/dL (ref 6.0–8.5)

## 2018-05-18 LAB — SEDIMENTATION RATE: Sed Rate: 5 mm/hr (ref 0–30)

## 2018-05-18 LAB — CK: Total CK: 77 U/L (ref 41–331)

## 2018-05-18 LAB — ANA: ANA Titer 1: NEGATIVE

## 2018-05-18 LAB — C-REACTIVE PROTEIN: CRP: 2 mg/L (ref 0–10)

## 2018-05-18 LAB — RHEUMATOID FACTOR: Rheumatoid fact SerPl-aCnc: 12.6 IU/mL (ref 0.0–13.9)

## 2018-05-18 LAB — PSA: Prostate Specific Ag, Serum: 0.9 ng/mL (ref 0.0–4.0)

## 2018-06-02 ENCOUNTER — Encounter: Payer: BLUE CROSS/BLUE SHIELD | Admitting: Family Medicine

## 2018-06-13 ENCOUNTER — Other Ambulatory Visit: Payer: Self-pay | Admitting: Family Medicine

## 2018-07-07 ENCOUNTER — Other Ambulatory Visit: Payer: Self-pay | Admitting: Nurse Practitioner

## 2018-07-07 DIAGNOSIS — C220 Liver cell carcinoma: Secondary | ICD-10-CM

## 2018-07-08 ENCOUNTER — Other Ambulatory Visit: Payer: BLUE CROSS/BLUE SHIELD

## 2018-07-19 DIAGNOSIS — C220 Liver cell carcinoma: Secondary | ICD-10-CM | POA: Diagnosis not present

## 2018-07-21 ENCOUNTER — Other Ambulatory Visit: Payer: Self-pay | Admitting: Nurse Practitioner

## 2018-07-23 ENCOUNTER — Ambulatory Visit
Admission: RE | Admit: 2018-07-23 | Discharge: 2018-07-23 | Disposition: A | Discharge status: Home / Self Care | Payer: BC Managed Care – PPO | Source: Ambulatory Visit | Attending: Nurse Practitioner | Admitting: Nurse Practitioner

## 2018-07-23 ENCOUNTER — Other Ambulatory Visit: Payer: Self-pay

## 2018-07-23 DIAGNOSIS — K7689 Other specified diseases of liver: Secondary | ICD-10-CM | POA: Diagnosis not present

## 2018-07-23 DIAGNOSIS — Z8505 Personal history of malignant neoplasm of liver: Secondary | ICD-10-CM | POA: Diagnosis not present

## 2018-07-23 DIAGNOSIS — C220 Liver cell carcinoma: Secondary | ICD-10-CM

## 2018-07-23 DIAGNOSIS — N281 Cyst of kidney, acquired: Secondary | ICD-10-CM | POA: Diagnosis not present

## 2018-07-23 DIAGNOSIS — K7460 Unspecified cirrhosis of liver: Secondary | ICD-10-CM | POA: Diagnosis not present

## 2018-07-23 MED ORDER — GADOBUTROL 1 MMOL/ML IV SOLN
10.0000 mL | Freq: Once | INTRAVENOUS | Status: AC | PRN
  Administered 2018-07-23: 10 mL via INTRAVENOUS

## 2018-07-28 DIAGNOSIS — C220 Liver cell carcinoma: Secondary | ICD-10-CM | POA: Diagnosis not present

## 2018-07-28 DIAGNOSIS — Z9889 Other specified postprocedural states: Secondary | ICD-10-CM | POA: Diagnosis not present

## 2018-07-29 DIAGNOSIS — C220 Liver cell carcinoma: Secondary | ICD-10-CM | POA: Diagnosis not present

## 2018-09-09 ENCOUNTER — Encounter: Payer: BLUE CROSS/BLUE SHIELD | Admitting: Family Medicine

## 2018-09-19 ENCOUNTER — Encounter (HOSPITAL_COMMUNITY): Payer: Self-pay | Admitting: Emergency Medicine

## 2018-09-19 ENCOUNTER — Other Ambulatory Visit: Payer: Self-pay

## 2018-09-19 ENCOUNTER — Emergency Department (HOSPITAL_COMMUNITY)
Admission: EM | Admit: 2018-09-19 | Discharge: 2018-09-19 | Disposition: A | Discharge status: Home / Self Care | Payer: BC Managed Care – PPO | Attending: Emergency Medicine | Admitting: Emergency Medicine

## 2018-09-19 ENCOUNTER — Emergency Department (HOSPITAL_COMMUNITY): Payer: BC Managed Care – PPO

## 2018-09-19 DIAGNOSIS — Y93I9 Activity, other involving external motion: Secondary | ICD-10-CM | POA: Insufficient documentation

## 2018-09-19 DIAGNOSIS — S62307A Unspecified fracture of fifth metacarpal bone, left hand, initial encounter for closed fracture: Secondary | ICD-10-CM | POA: Diagnosis not present

## 2018-09-19 DIAGNOSIS — I10 Essential (primary) hypertension: Secondary | ICD-10-CM | POA: Diagnosis not present

## 2018-09-19 DIAGNOSIS — M545 Low back pain: Secondary | ICD-10-CM | POA: Diagnosis not present

## 2018-09-19 DIAGNOSIS — Z79899 Other long term (current) drug therapy: Secondary | ICD-10-CM | POA: Diagnosis not present

## 2018-09-19 DIAGNOSIS — S32010A Wedge compression fracture of first lumbar vertebra, initial encounter for closed fracture: Secondary | ICD-10-CM | POA: Diagnosis not present

## 2018-09-19 DIAGNOSIS — Y92007 Garden or yard of unspecified non-institutional (private) residence as the place of occurrence of the external cause: Secondary | ICD-10-CM | POA: Insufficient documentation

## 2018-09-19 DIAGNOSIS — S3992XA Unspecified injury of lower back, initial encounter: Secondary | ICD-10-CM | POA: Diagnosis not present

## 2018-09-19 DIAGNOSIS — Y999 Unspecified external cause status: Secondary | ICD-10-CM | POA: Diagnosis not present

## 2018-09-19 MED ORDER — ONDANSETRON 8 MG PO TBDP
8.0000 mg | ORAL_TABLET | Freq: Once | ORAL | Status: AC
  Administered 2018-09-19: 18:00:00 8 mg via ORAL
  Filled 2018-09-19: qty 1

## 2018-09-19 MED ORDER — ONDANSETRON 4 MG PO TBDP: 4.0000 mg | ORAL_TABLET | Freq: Three times a day (TID) | ORAL | 0 refills | Status: DC | PRN

## 2018-09-19 MED ORDER — OXYCODONE-ACETAMINOPHEN 5-325 MG PO TABS: 1.0000 | ORAL_TABLET | ORAL | 0 refills | Status: DC | PRN

## 2018-09-19 MED ORDER — OXYCODONE-ACETAMINOPHEN 5-325 MG PO TABS
1.0000 | ORAL_TABLET | Freq: Once | ORAL | Status: AC
  Administered 2018-09-19: 18:00:00 1 via ORAL
  Filled 2018-09-19: qty 1

## 2018-09-19 NOTE — ED Notes (Signed)
From Rad   TT in to assess

## 2018-09-19 NOTE — ED Notes (Signed)
Pt and fam rushed to get to pharm in time to pick up meds  Left without sling   Verbalized understanding of DC instruct, med regime and follow up   To exit by [facility]  With daughter driving

## 2018-09-19 NOTE — Discharge Instructions (Signed)
Elevate your hand when possible.  Apply ice packs on and off to your back into your hand.  Avoid bending or twisting movements.  Call 1 of the orthopedic providers listed to arrange a follow-up appointment.  Return to the ER for any worsening symptoms.  Be sure to take stool softeners to keep your stools loose and prevent constipation.
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
## 2018-09-19 NOTE — ED Notes (Signed)
To Rad 
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
## 2018-09-20 ENCOUNTER — Ambulatory Visit (INDEPENDENT_AMBULATORY_CARE_PROVIDER_SITE_OTHER): Payer: BC Managed Care – PPO | Admitting: Orthopedic Surgery

## 2018-09-20 ENCOUNTER — Encounter: Payer: Self-pay | Admitting: Orthopedic Surgery

## 2018-09-20 ENCOUNTER — Telehealth: Payer: Self-pay | Admitting: Orthopaedic Surgery

## 2018-09-20 VITALS — Ht 68.0 in | Wt 230.0 lb

## 2018-09-20 DIAGNOSIS — S62367A Nondisplaced fracture of neck of fifth metacarpal bone, left hand, initial encounter for closed fracture: Secondary | ICD-10-CM

## 2018-09-20 DIAGNOSIS — S32000A Wedge compression fracture of unspecified lumbar vertebra, initial encounter for closed fracture: Secondary | ICD-10-CM | POA: Diagnosis not present

## 2018-09-20 MED ORDER — OXYCODONE-ACETAMINOPHEN 5-325 MG PO TABS: 1.0000 | ORAL_TABLET | ORAL | 0 refills | Status: DC | PRN
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
## 2018-09-20 NOTE — Telephone Encounter (Signed)
A [employer] advocate called for Mr. Lacuesta this morning.  She said he went to ED and has a fractured hand.  He wanted to schedule an emergency appointment with Dr. Aline Brochure.  I told her that Dr. Aline Brochure was out of the office this week but that Dr. Luna Glasgow was seeing patients.  She said that on the patient discharge papers there was also listed [employer].  She could not find their phone number so I gave that to her and the name [facility].  She said she would call them to see if and when they could get Mr. Rayman in to see someone in their practice.  I told her that I could get him in with Dr. Luna Glasgow on Thursday.  She said she would discuss this with the patient and let me know what is decided.

## 2018-09-21 ENCOUNTER — Telehealth: Payer: Self-pay | Admitting: Orthopedic Surgery

## 2018-09-21 NOTE — Telephone Encounter (Signed)
Called pt and advised can apply heat to back with heating pad or with thermal strip from pharm.

## 2018-09-21 NOTE — Telephone Encounter (Signed)
Patient called wanting to know if he needs to apply a cold compress or warm compress.  CB#[phone].  Thank you.

## 2018-09-22 ENCOUNTER — Encounter: Payer: Self-pay | Admitting: Orthopedic Surgery

## 2018-09-25 ENCOUNTER — Encounter: Payer: Self-pay | Admitting: Orthopedic Surgery

## 2018-09-26 ENCOUNTER — Other Ambulatory Visit: Payer: Self-pay | Admitting: Orthopedic Surgery

## 2018-09-26 MED ORDER — METHOCARBAMOL 500 MG PO TABS: 500.0000 mg | ORAL_TABLET | Freq: Three times a day (TID) | ORAL | 0 refills | Status: DC | PRN

## 2018-09-29 ENCOUNTER — Encounter: Payer: Self-pay | Admitting: Orthopedic Surgery

## 2018-09-30 DIAGNOSIS — C220 Liver cell carcinoma: Secondary | ICD-10-CM | POA: Diagnosis not present

## 2018-10-03 DIAGNOSIS — C220 Liver cell carcinoma: Secondary | ICD-10-CM | POA: Diagnosis not present

## 2018-10-04 ENCOUNTER — Other Ambulatory Visit: Payer: Self-pay | Admitting: Orthopedic Surgery

## 2018-10-04 DIAGNOSIS — C220 Liver cell carcinoma: Secondary | ICD-10-CM | POA: Diagnosis not present

## 2018-10-05 ENCOUNTER — Encounter: Payer: Self-pay | Admitting: Family Medicine

## 2018-10-05 ENCOUNTER — Other Ambulatory Visit: Payer: Self-pay | Admitting: Orthopedic Surgery

## 2018-10-05 DIAGNOSIS — Z1159 Encounter for screening for other viral diseases: Secondary | ICD-10-CM

## 2018-10-05 DIAGNOSIS — E782 Mixed hyperlipidemia: Secondary | ICD-10-CM

## 2018-10-05 DIAGNOSIS — I10 Essential (primary) hypertension: Secondary | ICD-10-CM

## 2018-10-05 DIAGNOSIS — Z114 Encounter for screening for human immunodeficiency virus [HIV]: Secondary | ICD-10-CM

## 2018-10-05 MED ORDER — OXYCODONE-ACETAMINOPHEN 5-325 MG PO TABS: 1.0000 | ORAL_TABLET | Freq: Three times a day (TID) | ORAL | 0 refills | Status: DC | PRN

## 2018-10-05 NOTE — Telephone Encounter (Signed)
I recommend lipid, met 7, patient also due for HIV antibody and hepatitis C antibody as part of screening protocol  I recommend patient set up a physical visit

## 2018-10-05 NOTE — Addendum Note (Signed)
Addended by: Vicente Males on: 10/05/2018 04:30 PM   Modules accepted: Orders

## 2018-10-05 NOTE — Telephone Encounter (Signed)
rx sent

## 2018-10-05 NOTE — Telephone Encounter (Signed)
Pt had a refill on 09/22/18 #30 and 09/19/18 #15 for lumbar compression fx. Please advise.

## 2018-10-06 DIAGNOSIS — I10 Essential (primary) hypertension: Secondary | ICD-10-CM | POA: Diagnosis not present

## 2018-10-06 DIAGNOSIS — Z1159 Encounter for screening for other viral diseases: Secondary | ICD-10-CM | POA: Diagnosis not present

## 2018-10-06 DIAGNOSIS — E782 Mixed hyperlipidemia: Secondary | ICD-10-CM | POA: Diagnosis not present

## 2018-10-06 DIAGNOSIS — Z114 Encounter for screening for human immunodeficiency virus [HIV]: Secondary | ICD-10-CM | POA: Diagnosis not present

## 2018-10-07 LAB — LIPID PANEL
Chol/HDL Ratio: 4.8 ratio (ref 0.0–5.0)
Cholesterol, Total: 209 mg/dL — ABNORMAL HIGH (ref 100–199)
HDL: 44 mg/dL (ref >39)
LDL Chol Calc (NIH): 133 mg/dL — ABNORMAL HIGH (ref 0–99)
Triglycerides: 177 mg/dL — ABNORMAL HIGH (ref 0–149)
VLDL Cholesterol Cal: 32 mg/dL (ref 5–40)

## 2018-10-07 LAB — HIV ANTIBODY (ROUTINE TESTING W REFLEX): HIV Screen 4th Generation wRfx: NONREACTIVE

## 2018-10-07 LAB — BASIC METABOLIC PANEL
BUN/Creatinine Ratio: 11 (ref 10–24)
BUN: 9 mg/dL (ref 8–27)
CO2: 22 mmol/L (ref 20–29)
Calcium: 10 mg/dL (ref 8.6–10.2)
Chloride: 105 mmol/L (ref 96–106)
Creatinine, Ser: 0.81 mg/dL (ref 0.76–1.27)
GFR calc Af Amer: 109 mL/min/{1.73_m2} (ref >59)
GFR calc non Af Amer: 95 mL/min/{1.73_m2} (ref >59)
Glucose: 100 mg/dL — ABNORMAL HIGH (ref 65–99)
Potassium: 4.7 mmol/L (ref 3.5–5.2)
Sodium: 142 mmol/L (ref 134–144)

## 2018-10-07 LAB — HEPATITIS C ANTIBODY: Hep C Virus Ab: <0.1 s/co ratio (ref 0.0–0.9)

## 2018-10-11 ENCOUNTER — Ambulatory Visit (INDEPENDENT_AMBULATORY_CARE_PROVIDER_SITE_OTHER): Payer: BC Managed Care – PPO

## 2018-10-11 ENCOUNTER — Ambulatory Visit (INDEPENDENT_AMBULATORY_CARE_PROVIDER_SITE_OTHER): Payer: BC Managed Care – PPO | Admitting: Orthopedic Surgery

## 2018-10-11 ENCOUNTER — Encounter: Payer: Self-pay | Admitting: Orthopedic Surgery

## 2018-10-11 VITALS — Ht 68.0 in | Wt 230.0 lb

## 2018-10-11 DIAGNOSIS — S32000A Wedge compression fracture of unspecified lumbar vertebra, initial encounter for closed fracture: Secondary | ICD-10-CM

## 2018-10-11 DIAGNOSIS — S62367A Nondisplaced fracture of neck of fifth metacarpal bone, left hand, initial encounter for closed fracture: Secondary | ICD-10-CM | POA: Diagnosis not present

## 2018-11-01 ENCOUNTER — Encounter: Payer: Self-pay | Admitting: Orthopedic Surgery

## 2018-11-01 NOTE — Progress Notes (Signed)
Office Visit Note   Patient: Logan Alvarez           Date of Birth: Feb 04, 1955           MRN: [id number] Visit Date: 10/11/2018              Requested by: Kathyrn Drown, MD [place],  [place] [address] PCP: Kathyrn Drown, MD  Chief Complaint  Patient presents with  . Lower Back - Follow-up, Fracture  . Left Hand - Follow-up, Fracture      HPI: Patient is a 62 year old gentleman who presents in follow-up for a L1 compression fracture lumbar spine as well as an impacted metacarpal neck fracture left hand fifth metacarpal.  Patient states his lumbar spine is doing well without symptoms.  Patient feels like he could return to work next week.  Assessment & Plan: Visit Diagnoses:  1. Lumbar compression fracture, closed, initial encounter ([place])   2. Closed nondisplaced fracture of neck of fifth metacarpal bone of left hand, initial encounter     Plan: Plan: Patient is given a note that he may return to work next week without restrictions.  Patient will increase his activities with the left hand as well.  Patient is currently on ibuprofen and is taking 400 mg in the evening 800 mg in the morning.  Follow-Up Instructions: Return if symptoms worsen or fail to improve.   Ortho Exam  Patient is alert, oriented, no adenopathy, well-dressed, normal affect, normal respiratory effort. Examination of the left hand patient has full range of motion of all fingers there is no rotational deformity of the little finger.  With gait he is walking on the outside of the right foot.  He does have pain to palpation over the sinus Tarsi on the right status post calcaneus fracture.  He states his only problem is on uneven terrain.  Patient was given instructions for Achilles stretching.  Patient has no radicular symptoms either lower extremity no focal motor weakness his lumbar spine is asymptomatic.  Imaging: No results found. No images are attached to the encounter.  Labs: Lab  Results  Component Value Date   HGBA1C 5.1 05/25/2012   ESRSEDRATE 5 05/17/2018   CRP 2 05/17/2018     Lab Results  Component Value Date   ALBUMIN 4.6 05/17/2018   ALBUMIN 4.6 10/18/2017   ALBUMIN 4.4 01/28/2017    No results found for: MG No results found for: VD25OH  No results found for: PREALBUMIN CBC EXTENDED Latest Ref Rng & Units 11/08/2017 10/18/2017 05/25/2015  WBC 4.0 - 10.5 K/uL 7.2 8.7 10.9(H)  RBC 4.22 - 5.81 MIL/uL 5.02 4.88 4.76  HGB 13.0 - 17.0 g/dL 15.5 15.4 15.4  HCT 39.0 - 52.0 % 47.1 45.0 43.2  PLT 150 - 400 K/uL 244 266 241  NEUTROABS 1.4 - 7.0 x10E3/uL - 5.6 7.4  LYMPHSABS 0.7 - 3.1 x10E3/uL - 1.9 2.3     Body mass index is 34.97 kg/m.  Orders:  Orders Placed This Encounter  Procedures  . XR Hand Complete Left  . XR Lumbar Spine 2-3 Views   No orders of the defined types were placed in this encounter.    Procedures: No procedures performed  Clinical Data: No additional findings.  ROS:  All other systems negative, except as noted in the HPI. Review of Systems  Objective: Vital Signs: Ht 5\' 8"  (1.727 m)   Wt 230 lb (104.3 kg)   BMI 34.97 kg/m  Specialty Comments:  No specialty comments available.  PMFS History: Patient Active Problem List   Diagnosis Date Noted  . Hepatocellular carcinoma ([place]) 11/12/2017  . Special screening for malignant neoplasms, colon   . Closed intra-articular fracture of right calcaneus, sequela 03/03/2017  . Arthritis of midtarsal joint of right foot 03/03/2017  . Testosterone deficiency 05/27/2012  . Chest pain 04/15/2011  . HTN (hypertension) 04/15/2011  . Mixed hyperlipidemia 04/15/2011  . Anxiety 04/15/2011   Past Medical History:  Diagnosis Date  . Chest pain    a. 2008: Normal ETT;  b. 01/2013 CTA: Ca score of 715 (92%'ile), LAD 50p, D1 50-75, RCA 50p.  Marland Kitchen Hyperlipidemia   . Hypertension   . Testosterone deficiency 2009    Family History  Problem Relation Age of Onset  . Heart attack  Father        59's    Past Surgical History:  Procedure Laterality Date  . COLONOSCOPY N/A 03/12/2017   Procedure: COLONOSCOPY;  Surgeon: Danie Binder, MD;  [facility];  Service: Endoscopy;  Laterality: N/A;  2:00  . LEFT HEART CATHETERIZATION WITH CORONARY ANGIOGRAM N/A 02/01/2013   Procedure: LEFT HEART CATHETERIZATION WITH CORONARY ANGIOGRAM;  Surgeon: Josue Hector, MD;  Location: [facility] CATH LAB;  Service: Cardiovascular;  Laterality: N/A;  . NECK SURGERY     Social History   Occupational History  . Not on file  Tobacco Use  . Smoking status: Never Smoker  . Smokeless tobacco: Never Used  Substance and Sexual Activity  . Alcohol use: Yes    Comment: 4-6 beers a day  . Drug use: No  . Sexual activity: Not on file

## 2018-11-11 ENCOUNTER — Ambulatory Visit (INDEPENDENT_AMBULATORY_CARE_PROVIDER_SITE_OTHER): Payer: BC Managed Care – PPO | Admitting: Family Medicine

## 2018-11-11 ENCOUNTER — Other Ambulatory Visit: Payer: Self-pay

## 2018-11-11 ENCOUNTER — Encounter: Payer: Self-pay | Admitting: Family Medicine

## 2018-11-11 VITALS — BP 126/84 | Temp 97.6°F | Ht 68.5 in | Wt 233.0 lb

## 2018-11-11 DIAGNOSIS — Z0000 Encounter for general adult medical examination without abnormal findings: Secondary | ICD-10-CM | POA: Diagnosis not present

## 2018-11-11 DIAGNOSIS — R350 Frequency of micturition: Secondary | ICD-10-CM

## 2018-11-11 DIAGNOSIS — F419 Anxiety disorder, unspecified: Secondary | ICD-10-CM | POA: Diagnosis not present

## 2018-11-11 DIAGNOSIS — Z23 Encounter for immunization: Secondary | ICD-10-CM

## 2018-11-11 DIAGNOSIS — R400 Somnolence: Secondary | ICD-10-CM

## 2018-11-11 DIAGNOSIS — N529 Male erectile dysfunction, unspecified: Secondary | ICD-10-CM | POA: Diagnosis not present

## 2018-11-11 DIAGNOSIS — E782 Mixed hyperlipidemia: Secondary | ICD-10-CM

## 2018-11-11 DIAGNOSIS — N401 Enlarged prostate with lower urinary tract symptoms: Secondary | ICD-10-CM

## 2018-11-11 MED ORDER — ALPRAZOLAM 0.5 MG PO TABS: ORAL_TABLET | ORAL | 5 refills | Status: DC

## 2018-11-11 MED ORDER — PRAVASTATIN SODIUM 40 MG PO TABS: 40.0000 mg | ORAL_TABLET | Freq: Every day | ORAL | 1 refills | Status: DC

## 2018-11-11 NOTE — Patient Instructions (Addendum)
DASH Eating Plan DASH stands for "Dietary Approaches to Stop Hypertension." The DASH eating plan is a healthy eating plan that has been shown to reduce high blood pressure (hypertension). It may also reduce your risk for type 2 diabetes, heart disease, and stroke. The DASH eating plan may also help with weight loss. What are tips for following this plan?  General guidelines  Avoid eating more than 2,300 mg (milligrams) of salt (sodium) a day. If you have hypertension, you may need to reduce your sodium intake to 1,500 mg a day.  Limit alcohol intake to no more than 1 drink a day for nonpregnant women and 2 drinks a day for men. One drink equals 12 oz of beer, 5 oz of wine, or 1 oz of hard liquor.  Work with your health care provider to maintain a healthy body weight or to lose weight. Ask what an ideal weight is for you.  Get at least 30 minutes of exercise that causes your heart to beat faster (aerobic exercise) most days of the week. Activities may include walking, swimming, or biking.  Work with your health care provider or diet and nutrition specialist (dietitian) to adjust your eating plan to your individual calorie needs. Reading food labels   Check food labels for the amount of sodium per serving. Choose foods with less than 5 percent of the Daily Value of sodium. Generally, foods with less than 300 mg of sodium per serving fit into this eating plan.  To find whole grains, look for the word "whole" as the first word in the ingredient list. Shopping  Buy products labeled as "low-sodium" or "no salt added."  Buy fresh foods. Avoid canned foods and premade or frozen meals. Cooking  Avoid adding salt when cooking. Use salt-free seasonings or herbs instead of table salt or sea salt. Check with your health care provider or pharmacist before using salt substitutes.  Do not fry foods. Cook foods using healthy methods such as baking, boiling, grilling, and broiling instead.  Cook with  heart-healthy oils, such as olive, canola, soybean, or sunflower oil. Meal planning  Eat a balanced diet that includes: ? 5 or more servings of fruits and vegetables each day. At each meal, try to fill half of your plate with fruits and vegetables. ? Up to 6-8 servings of whole grains each day. ? Less than 6 oz of lean meat, poultry, or fish each day. A 3-oz serving of meat is about the same size as a deck of cards. One egg equals 1 oz. ? 2 servings of low-fat dairy each day. ? A serving of nuts, seeds, or beans 5 times each week. ? Heart-healthy fats. Healthy fats called Omega-3 fatty acids are found in foods such as flaxseeds and coldwater fish, like sardines, salmon, and mackerel.  Limit how much you eat of the following: ? Canned or prepackaged foods. ? Food that is high in trans fat, such as fried foods. ? Food that is high in saturated fat, such as fatty meat. ? Sweets, desserts, sugary drinks, and other foods with added sugar. ? Full-fat dairy products.  Do not salt foods before eating.  Try to eat at least 2 vegetarian meals each week.  Eat more home-cooked food and less restaurant, buffet, and fast food.  When eating at a restaurant, ask that your food be prepared with less salt or no salt, if possible. What foods are recommended? The items listed may not be a complete list. Talk with your dietitian about   what dietary choices are best for you. Grains Whole-grain or whole-wheat bread. Whole-grain or whole-wheat pasta. Brown rice. Oatmeal. Quinoa. Bulgur. Whole-grain and low-sodium cereals. Pita bread. Low-fat, low-sodium crackers. Whole-wheat flour tortillas. Vegetables Fresh or frozen vegetables (raw, steamed, roasted, or grilled). Low-sodium or reduced-sodium tomato and vegetable juice. Low-sodium or reduced-sodium tomato sauce and tomato paste. Low-sodium or reduced-sodium canned vegetables. Fruits All fresh, dried, or frozen fruit. Canned fruit in natural juice (without  added sugar). Meat and other protein foods Skinless chicken or turkey. Ground chicken or turkey. Pork with fat trimmed off. Fish and seafood. Egg whites. Dried beans, peas, or lentils. Unsalted nuts, nut butters, and seeds. Unsalted canned beans. Lean cuts of beef with fat trimmed off. Low-sodium, lean deli meat. Dairy Low-fat (1%) or fat-free (skim) milk. Fat-free, low-fat, or reduced-fat cheeses. Nonfat, low-sodium ricotta or cottage cheese. Low-fat or nonfat yogurt. Low-fat, low-sodium cheese. Fats and oils Soft margarine without trans fats. Vegetable oil. Low-fat, reduced-fat, or light mayonnaise and salad dressings (reduced-sodium). Canola, safflower, olive, soybean, and sunflower oils. Avocado. Seasoning and other foods Herbs. Spices. Seasoning mixes without salt. Unsalted popcorn and pretzels. Fat-free sweets. What foods are not recommended? The items listed may not be a complete list. Talk with your dietitian about what dietary choices are best for you. Grains Baked goods made with fat, such as croissants, muffins, or some breads. Dry pasta or rice meal packs. Vegetables Creamed or fried vegetables. Vegetables in a cheese sauce. Regular canned vegetables (not low-sodium or reduced-sodium). Regular canned tomato sauce and paste (not low-sodium or reduced-sodium). Regular tomato and vegetable juice (not low-sodium or reduced-sodium). Pickles. Olives. Fruits Canned fruit in a light or heavy syrup. Fried fruit. Fruit in cream or butter sauce. Meat and other protein foods Fatty cuts of meat. Ribs. Fried meat. Bacon. Sausage. Bologna and other processed lunch meats. Salami. Fatback. Hotdogs. Bratwurst. Salted nuts and seeds. Canned beans with added salt. Canned or smoked fish. Whole eggs or egg yolks. Chicken or turkey with skin. Dairy Whole or 2% milk, cream, and half-and-half. Whole or full-fat cream cheese. Whole-fat or sweetened yogurt. Full-fat cheese. Nondairy creamers. Whipped toppings.  Processed cheese and cheese spreads. Fats and oils Butter. Stick margarine. Lard. Shortening. Ghee. Bacon fat. Tropical oils, such as coconut, palm kernel, or palm oil. Seasoning and other foods Salted popcorn and pretzels. Onion salt, garlic salt, seasoned salt, table salt, and sea salt. Worcestershire sauce. Tartar sauce. Barbecue sauce. Teriyaki sauce. Soy sauce, including reduced-sodium. Steak sauce. Canned and packaged gravies. Fish sauce. Oyster sauce. Cocktail sauce. Horseradish that you find on the shelf. Ketchup. Mustard. Meat flavorings and tenderizers. Bouillon cubes. Hot sauce and Tabasco sauce. Premade or packaged marinades. Premade or packaged taco seasonings. Relishes. Regular salad dressings. Where to find more information:  National Heart, Lung, and Blood Institute: www.nhlbi.nih.gov  American Heart Association: www.heart.org Summary  The DASH eating plan is a healthy eating plan that has been shown to reduce high blood pressure (hypertension). It may also reduce your risk for type 2 diabetes, heart disease, and stroke.  With the DASH eating plan, you should limit salt (sodium) intake to 2,300 mg a day. If you have hypertension, you may need to reduce your sodium intake to 1,500 mg a day.  When on the DASH eating plan, aim to eat more fresh fruits and vegetables, whole grains, lean proteins, low-fat dairy, and heart-healthy fats.  Work with your health care provider or diet and nutrition specialist (dietitian) to adjust your eating plan to your   individual calorie needs. This information is not intended to replace advice given to you by your health care provider. Make sure you discuss any questions you have with your health care provider. Document Released: 01/08/2011 Document Revised: 01/01/2017 Document Reviewed: 01/13/2016 Elsevier Patient Education  [place].    Shingrix and shingles prevention: know the facts!   Shingrix is a very effective vaccine to prevent  shingles.   Shingles is a reactivation of chickenpox -more than 99% of Americans born before 1980 have had chickenpox even if they do not remember it. One in every 10 people who get shingles have severe long-lasting nerve pain as a result.   33 out of a 100 older adults will get shingles if they are unvaccinated.     This vaccine is very important for your health This vaccine is indicated for anyone 50 years or older. You can get this vaccine even if you have already had shingles because you can get the disease more than once in a lifetime.  Your risk for shingles and its complications increases with age.  This vaccine has 2 doses.  The second dose would be 2 to 6 months after the first dose.  If you had Zostavax vaccine in the past you should still get Shingrix. ( Zostavax is only 70% effective and it loses significant strength over a few years .)  This vaccine is given through the pharmacy.  The cost of the vaccine is through your insurance. The pharmacy can inform you of the total costs.  Common side effects including soreness in the arm, some redness and swelling, also some feel fatigue muscle soreness headache low-grade fever.  Side effects typically go away within 2 to 3 days. Remember-the pain from shingles can last a lifetime but these side effects of the vaccine will only last a few days at most. It is very important to get both doses in order to protect yourself fully.   Please get this vaccine at your earliest convenience at your trusted pharmacy.
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
## 2018-12-20 ENCOUNTER — Encounter: Payer: Self-pay | Admitting: Family Medicine

## 2018-12-20 NOTE — Telephone Encounter (Signed)
Nurses  I would recommend to have the patient be seen by Dr. Aline Brochure for further evaluation  Please let the patient know that he is a good orthopedist who is here in [place]

## 2019-01-11 ENCOUNTER — Other Ambulatory Visit: Payer: Self-pay | Admitting: Family Medicine

## 2019-02-16 ENCOUNTER — Other Ambulatory Visit: Payer: Self-pay | Admitting: Family Medicine

## 2019-02-16 DIAGNOSIS — R0981 Nasal congestion: Secondary | ICD-10-CM | POA: Diagnosis not present

## 2019-02-16 DIAGNOSIS — H6983 Other specified disorders of Eustachian tube, bilateral: Secondary | ICD-10-CM | POA: Diagnosis not present

## 2019-02-16 DIAGNOSIS — Z7289 Other problems related to lifestyle: Secondary | ICD-10-CM | POA: Diagnosis not present

## 2019-03-14 ENCOUNTER — Other Ambulatory Visit: Payer: Self-pay

## 2019-03-14 MED ORDER — PRAVASTATIN SODIUM 40 MG PO TABS: 40.0000 mg | ORAL_TABLET | Freq: Every day | ORAL | 0 refills | Status: DC

## 2019-05-29 ENCOUNTER — Other Ambulatory Visit: Payer: Self-pay | Admitting: Family Medicine

## 2019-05-30 NOTE — Telephone Encounter (Signed)
May have this +1 refill Patient will need to do a follow-up visit within the next couple months due to this being a controlled that

## 2019-05-31 NOTE — Telephone Encounter (Signed)
Please schedule and then route back to nurses 

## 2019-06-01 ENCOUNTER — Other Ambulatory Visit: Payer: Self-pay | Admitting: *Deleted

## 2019-06-01 DIAGNOSIS — Z125 Encounter for screening for malignant neoplasm of prostate: Secondary | ICD-10-CM

## 2019-06-01 DIAGNOSIS — Z79899 Other long term (current) drug therapy: Secondary | ICD-10-CM

## 2019-06-01 DIAGNOSIS — E782 Mixed hyperlipidemia: Secondary | ICD-10-CM

## 2019-06-01 DIAGNOSIS — M791 Myalgia, unspecified site: Secondary | ICD-10-CM

## 2019-06-01 DIAGNOSIS — I10 Essential (primary) hypertension: Secondary | ICD-10-CM

## 2019-06-01 NOTE — Telephone Encounter (Signed)
Pt is scheduled 5/10.   Pt would like to know if Dr. Nicki Reaper would like him to have any lab work done. He is wanting to discuss cholesterol, and he has been having muscle aches.

## 2019-06-01 NOTE — Telephone Encounter (Signed)
I recommend lipid, liver, metabolic 7, PSA, CK, sed rate Diagnosis hyperlipidemia hypertension screening for prostate cancer myalgia's May refill medicine please pend

## 2019-06-05 DIAGNOSIS — Z23 Encounter for immunization: Secondary | ICD-10-CM | POA: Diagnosis not present

## 2019-06-06 DIAGNOSIS — Z125 Encounter for screening for malignant neoplasm of prostate: Secondary | ICD-10-CM | POA: Diagnosis not present

## 2019-06-06 DIAGNOSIS — E782 Mixed hyperlipidemia: Secondary | ICD-10-CM | POA: Diagnosis not present

## 2019-06-06 DIAGNOSIS — Z79899 Other long term (current) drug therapy: Secondary | ICD-10-CM | POA: Diagnosis not present

## 2019-06-06 DIAGNOSIS — I10 Essential (primary) hypertension: Secondary | ICD-10-CM | POA: Diagnosis not present

## 2019-06-07 LAB — BASIC METABOLIC PANEL
BUN/Creatinine Ratio: 12 (ref 10–24)
BUN: 9 mg/dL (ref 8–27)
CO2: 23 mmol/L (ref 20–29)
Calcium: 9.6 mg/dL (ref 8.6–10.2)
Chloride: 98 mmol/L (ref 96–106)
Creatinine, Ser: 0.73 mg/dL — ABNORMAL LOW (ref 0.76–1.27)
GFR calc Af Amer: 113 mL/min/{1.73_m2} (ref >59)
GFR calc non Af Amer: 98 mL/min/{1.73_m2} (ref >59)
Glucose: 109 mg/dL — ABNORMAL HIGH (ref 65–99)
Potassium: 4.8 mmol/L (ref 3.5–5.2)
Sodium: 135 mmol/L (ref 134–144)

## 2019-06-07 LAB — PSA: Prostate Specific Ag, Serum: 1 ng/mL (ref 0.0–4.0)

## 2019-06-07 LAB — HEPATIC FUNCTION PANEL
ALT: 23 IU/L (ref 0–44)
AST: 20 IU/L (ref 0–40)
Albumin: 4.3 g/dL (ref 3.8–4.8)
Alkaline Phosphatase: 108 IU/L (ref 39–117)
Bilirubin Total: 1.3 mg/dL — ABNORMAL HIGH (ref 0.0–1.2)
Bilirubin, Direct: 0.26 mg/dL (ref 0.00–0.40)
Total Protein: 7.3 g/dL (ref 6.0–8.5)

## 2019-06-07 LAB — SEDIMENTATION RATE: Sed Rate: 3 mm/hr (ref 0–30)

## 2019-06-07 LAB — LIPID PANEL
Chol/HDL Ratio: 3.7 ratio (ref 0.0–5.0)
Cholesterol, Total: 172 mg/dL (ref 100–199)
HDL: 46 mg/dL (ref >39)
LDL Chol Calc (NIH): 88 mg/dL (ref 0–99)
Triglycerides: 226 mg/dL — ABNORMAL HIGH (ref 0–149)
VLDL Cholesterol Cal: 38 mg/dL (ref 5–40)

## 2019-06-07 LAB — CK: Total CK: 65 U/L (ref 41–331)

## 2019-06-12 ENCOUNTER — Ambulatory Visit (INDEPENDENT_AMBULATORY_CARE_PROVIDER_SITE_OTHER): Payer: BC Managed Care – PPO | Admitting: Family Medicine

## 2019-06-12 ENCOUNTER — Other Ambulatory Visit: Payer: Self-pay

## 2019-06-12 VITALS — BP 136/78 | Wt 237.4 lb

## 2019-06-12 DIAGNOSIS — R7301 Impaired fasting glucose: Secondary | ICD-10-CM

## 2019-06-12 DIAGNOSIS — E785 Hyperlipidemia, unspecified: Secondary | ICD-10-CM

## 2019-06-12 DIAGNOSIS — I10 Essential (primary) hypertension: Secondary | ICD-10-CM

## 2019-06-12 DIAGNOSIS — F419 Anxiety disorder, unspecified: Secondary | ICD-10-CM | POA: Diagnosis not present

## 2019-06-12 MED ORDER — PRAVASTATIN SODIUM 20 MG PO TABS
20.0000 mg | ORAL_TABLET | Freq: Every day | ORAL | 1 refills | Status: DC
Start: 2019-06-12 — End: 2019-10-03

## 2019-06-12 MED ORDER — ALPRAZOLAM 0.5 MG PO TABS: ORAL_TABLET | ORAL | 0 refills | Status: DC

## 2019-06-12 NOTE — Progress Notes (Signed)
   Subjective:    Patient ID: Logan Alvarez, male    DOB: [birth year], 64 y.o.   MRN: [id number]  HPI Patient comes in today for a follow up on his medications.  Tired of xanax anxiety worse at times Denies being depressed Patient relates a lot of anxiety and anxiousness but does not want to be on any type of antidepressants because he states that typically they cause him to have significant health issues and side effects Feels fatigued at times  Patient states he stopped his pravastain per Dr. Nicki Reaper 1 week ago because of muscle pain/weakness. Patient has significant muscle pain and weakness on the higher dose he would like to try the lower dose and if the symptoms persist he will let us know Complaints of ear pain x 3 months.    Review of Systems  Constitutional: Negative for diaphoresis and fatigue.  HENT: Negative for congestion and rhinorrhea.   Respiratory: Negative for cough and shortness of breath.   Cardiovascular: Negative for chest pain and leg swelling.  Gastrointestinal: Negative for abdominal pain and diarrhea.  Skin: Negative for color change and rash.  Neurological: Negative for dizziness and headaches.  Psychiatric/Behavioral: Negative for behavioral problems and confusion.       Objective:   Physical Exam Vitals reviewed.  Constitutional:      General: He is not in acute distress. HENT:     Head: Normocephalic and atraumatic.  Eyes:     General:        Right eye: No discharge.        Left eye: No discharge.  Neck:     Trachea: No tracheal deviation.  Cardiovascular:     Rate and Rhythm: Normal rate and regular rhythm.     Heart sounds: Normal heart sounds. No murmur.  Pulmonary:     Effort: Pulmonary effort is normal. No respiratory distress.     Breath sounds: Normal breath sounds.  Lymphadenopathy:     Cervical: No cervical adenopathy.  Skin:    General: Skin is warm and dry.  Neurological:     Mental Status: He is alert.     Coordination:  Coordination normal.  Psychiatric:        Behavior: Behavior normal.    abd soft  senile purpura on arms       Assessment & Plan:  Blood pressure good control continue current measures Hyperlipidemia with muscle aches reduce the pravastatin to the lower dose notify us if any ongoing troubles Generalized anxiety disorder continue Xanax prescription was pended I did counsel him regarding trying a different SSRI he does not want to do that currently Offered counseling he defers currently Patient will follow up in the fall for labs and a wellness

## 2019-06-26 DIAGNOSIS — Z23 Encounter for immunization: Secondary | ICD-10-CM | POA: Diagnosis not present

## 2019-07-20 ENCOUNTER — Encounter: Payer: Self-pay | Admitting: Family Medicine

## 2019-08-02 ENCOUNTER — Other Ambulatory Visit: Payer: Self-pay | Admitting: Family Medicine

## 2019-08-15 DIAGNOSIS — J310 Chronic rhinitis: Secondary | ICD-10-CM | POA: Diagnosis not present

## 2019-08-15 DIAGNOSIS — H9042 Sensorineural hearing loss, unilateral, left ear, with unrestricted hearing on the contralateral side: Secondary | ICD-10-CM | POA: Diagnosis not present

## 2019-08-15 DIAGNOSIS — H6982 Other specified disorders of Eustachian tube, left ear: Secondary | ICD-10-CM | POA: Diagnosis not present

## 2019-08-24 DIAGNOSIS — H918X9 Other specified hearing loss, unspecified ear: Secondary | ICD-10-CM | POA: Diagnosis not present

## 2019-08-29 DIAGNOSIS — J310 Chronic rhinitis: Secondary | ICD-10-CM | POA: Diagnosis not present

## 2019-08-29 DIAGNOSIS — H9042 Sensorineural hearing loss, unilateral, left ear, with unrestricted hearing on the contralateral side: Secondary | ICD-10-CM | POA: Diagnosis not present

## 2019-08-29 DIAGNOSIS — J343 Hypertrophy of nasal turbinates: Secondary | ICD-10-CM | POA: Diagnosis not present

## 2019-09-06 ENCOUNTER — Encounter: Payer: Self-pay | Admitting: Family Medicine

## 2019-09-06 NOTE — Telephone Encounter (Signed)
Nurses-I would recommend Dr. Victorio Palm practice [facility] Please provide him the contact information thank you

## 2019-09-07 ENCOUNTER — Other Ambulatory Visit: Payer: Self-pay | Admitting: Family Medicine

## 2019-09-07 DIAGNOSIS — H9319 Tinnitus, unspecified ear: Secondary | ICD-10-CM

## 2019-09-19 ENCOUNTER — Encounter: Payer: Self-pay | Admitting: Family Medicine

## 2019-09-20 ENCOUNTER — Other Ambulatory Visit: Payer: Self-pay | Admitting: *Deleted

## 2019-09-20 DIAGNOSIS — M79676 Pain in unspecified toe(s): Secondary | ICD-10-CM

## 2019-09-20 NOTE — Telephone Encounter (Signed)
Typically what we would recommend is uric acid and metabolic 7 due to probable gout  Lab work and follow-up office visit would be wise

## 2019-09-21 LAB — BASIC METABOLIC PANEL
BUN/Creatinine Ratio: 16 (ref 10–24)
BUN: 13 mg/dL (ref 8–27)
CO2: 24 mmol/L (ref 20–29)
Calcium: 9.5 mg/dL (ref 8.6–10.2)
Chloride: 98 mmol/L (ref 96–106)
Creatinine, Ser: 0.83 mg/dL (ref 0.76–1.27)
GFR calc Af Amer: 107 mL/min/{1.73_m2} (ref >59)
GFR calc non Af Amer: 93 mL/min/{1.73_m2} (ref >59)
Glucose: 90 mg/dL (ref 65–99)
Potassium: 4.4 mmol/L (ref 3.5–5.2)
Sodium: 135 mmol/L (ref 134–144)

## 2019-09-21 LAB — URIC ACID: Uric Acid: 6.4 mg/dL (ref 3.8–8.4)

## 2019-10-03 ENCOUNTER — Other Ambulatory Visit: Payer: Self-pay

## 2019-10-03 ENCOUNTER — Ambulatory Visit (INDEPENDENT_AMBULATORY_CARE_PROVIDER_SITE_OTHER): Payer: BC Managed Care – PPO | Admitting: Family Medicine

## 2019-10-03 VITALS — BP 110/72 | HR 87 | Temp 97.5°F | Wt 234.0 lb

## 2019-10-03 DIAGNOSIS — M79676 Pain in unspecified toe(s): Secondary | ICD-10-CM | POA: Diagnosis not present

## 2019-10-03 DIAGNOSIS — F419 Anxiety disorder, unspecified: Secondary | ICD-10-CM | POA: Diagnosis not present

## 2019-10-03 DIAGNOSIS — H9312 Tinnitus, left ear: Secondary | ICD-10-CM | POA: Diagnosis not present

## 2019-10-03 DIAGNOSIS — H6983 Other specified disorders of Eustachian tube, bilateral: Secondary | ICD-10-CM | POA: Diagnosis not present

## 2019-10-03 DIAGNOSIS — H9122 Sudden idiopathic hearing loss, left ear: Secondary | ICD-10-CM | POA: Diagnosis not present

## 2019-10-03 MED ORDER — ALPRAZOLAM 0.5 MG PO TABS: ORAL_TABLET | ORAL | 5 refills | Status: DC

## 2019-10-03 MED ORDER — PRAVASTATIN SODIUM 20 MG PO TABS: 20.0000 mg | ORAL_TABLET | Freq: Every day | ORAL | 1 refills | Status: DC

## 2019-10-03 NOTE — Progress Notes (Signed)
   Subjective:    Patient ID: Logan Alvarez, male    DOB: 05-01-1955, 64 y.o.   MRN: [id number]  HPI  Patient arrives to follow up on blood work and gout. Patient states his gout is getting better Patient with a history of liver cancer Had surgery at [facility] Had follow-up there but more recently has gotten lost to follow-up without appropriate follow-up imaging Patient has severe underlying GAD takes his Xanax on a regular basis denies abusing it did not do well with SSRIs Review of Systems  Constitutional: Negative for diaphoresis and fatigue.  HENT: Negative for congestion and rhinorrhea.   Respiratory: Negative for cough and shortness of breath.   Cardiovascular: Negative for chest pain and leg swelling.  Gastrointestinal: Negative for abdominal pain and diarrhea.  Skin: Negative for color change and rash.  Neurological: Negative for dizziness and headaches.  Psychiatric/Behavioral: Negative for behavioral problems and confusion.       Objective:   Physical Exam Vitals reviewed.  Constitutional:      General: He is not in acute distress. HENT:     Head: Normocephalic and atraumatic.  Eyes:     General:        Right eye: No discharge.        Left eye: No discharge.  Neck:     Trachea: No tracheal deviation.  Cardiovascular:     Rate and Rhythm: Normal rate and regular rhythm.     Heart sounds: Normal heart sounds. No murmur heard.   Pulmonary:     Effort: Pulmonary effort is normal. No respiratory distress.     Breath sounds: Normal breath sounds.  Lymphadenopathy:     Cervical: No cervical adenopathy.  Skin:    General: Skin is warm and dry.  Neurological:     Mental Status: He is alert.     Coordination: Coordination normal.  Psychiatric:        Behavior: Behavior normal.      More than likely his toe had some infection that has healed itself He does have some underlying arthritis of the great toe on both sides     Assessment & Plan:  No sign of gout  currently GAD-refills of Xanax given Liver cancer MRI last year showed no sign of reoccurrence he will get back in with oncology at [facility] lab work in May look good wellness exam and lab work on next visit in approximately 6 months

## 2019-10-07 ENCOUNTER — Encounter: Payer: Self-pay | Admitting: Family Medicine

## 2019-10-10 ENCOUNTER — Other Ambulatory Visit: Payer: Self-pay | Admitting: *Deleted

## 2019-10-10 MED ORDER — ETODOLAC 400 MG PO TABS
400.0000 mg | ORAL_TABLET | Freq: Two times a day (BID) | ORAL | 0 refills | Status: DC
Start: 2019-10-10 — End: 2020-03-18

## 2019-10-10 NOTE — Telephone Encounter (Signed)
Left message to return call 

## 2019-10-10 NOTE — Telephone Encounter (Signed)
It would be very beneficial for the patient to take some pictures when this occurs and send them through the [facility]  Also may try Lodine 400 mg, 1 twice daily as needed, #20  May well need consultation with podiatry if patient is willing

## 2019-10-10 NOTE — Telephone Encounter (Signed)
Seen on 8/31 and pt states he thought it was getting better. Color was starting to come back but 2 days after visit it turned blue again. Does not feel cold. Pain only at night when lying down.

## 2019-10-10 NOTE — Telephone Encounter (Signed)
Discussed with pt. He states to hold off on referral for now and he did want to try lodine. I sent to pharm and he states he will try to send some pictures through [facility]

## 2019-10-23 DIAGNOSIS — C220 Liver cell carcinoma: Secondary | ICD-10-CM | POA: Diagnosis not present

## 2019-10-24 DIAGNOSIS — C220 Liver cell carcinoma: Secondary | ICD-10-CM | POA: Diagnosis not present

## 2019-11-03 DIAGNOSIS — Z8505 Personal history of malignant neoplasm of liver: Secondary | ICD-10-CM | POA: Diagnosis not present

## 2020-01-14 IMAGING — MR MR ABDOMEN WO/W CM
10 of 18 series · 24 of 48 positions shown · IV contrast (10ml Gadavist)
Comparison: Ultrasound on 10/19/2017

CLINICAL DATA: Right upper quadrant pain. Indeterminate liver
lesion on recent ultrasound.

EXAM:
MRI ABDOMEN WITHOUT AND WITH CONTRAST
TECHNIQUE: Multiplanar multisequence MR imaging of the abdomen was performed
both before and after the administration of intravenous contrast.
CONTRAST:  10 mL Gadavist

[Series 6: T2 · coronal · 5.0mm · 1.38mm/px · 1 of 38 slices shown (1 of 2)]
[im 1/38]
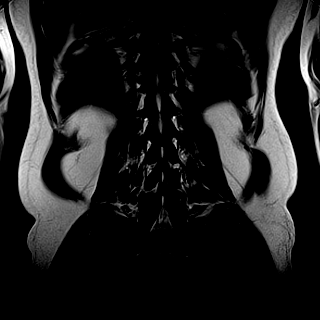

[Series 7: t2fs axial blade · axial · 4.0mm · 1.25mm/px · 1 of 46 slices shown]
[im 1/46]
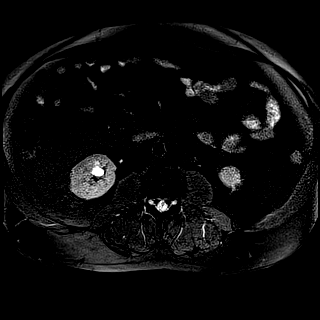

[Series 8: DWI · axial · 5.0mm · 1.04mm/px · z∈[-190,+44]mm · 2 of 80 slices shown (1 of 2)]
[im 1/80]
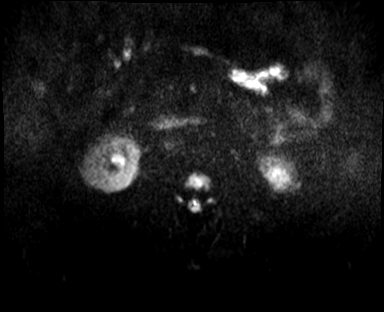
[im 80/80]
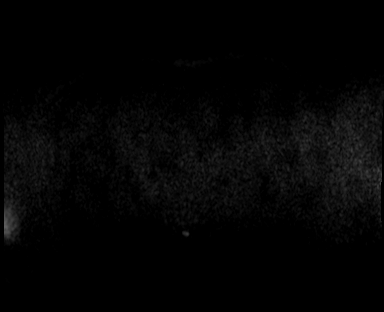

[Series 9: DWI · axial · 5.0mm · 1.04mm/px · z∈[-190,+44]mm · 2 of 40 slices shown (2 of 2)]
[im 1/40]
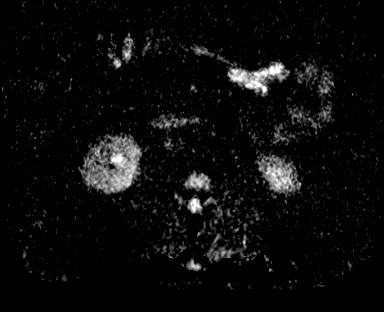
[im 40/40]
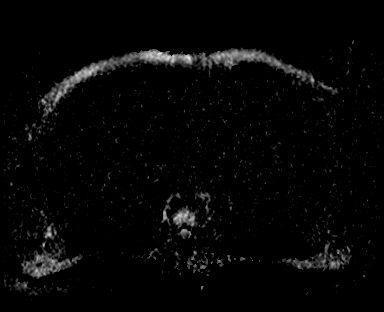

[Series 10: bSSFP · axial · 4.0mm · 0.82mm/px · z∈[-211,+65]mm · 3 of 70 slices shown]
[im 1/70]
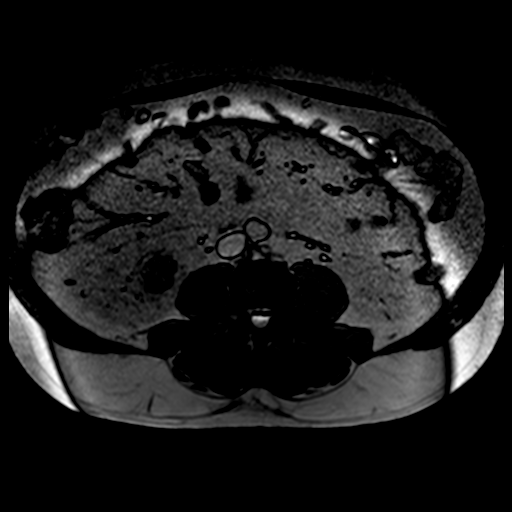
[im 35/70]
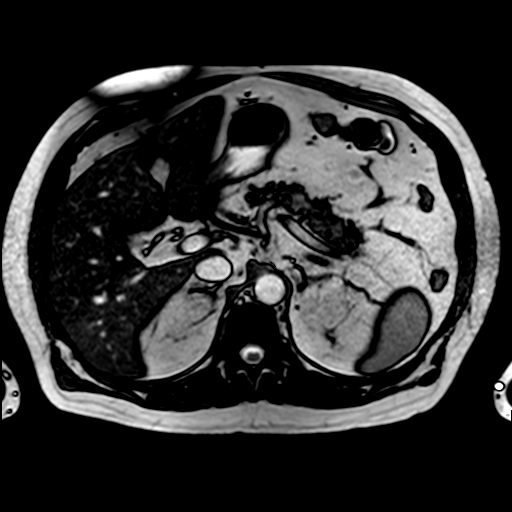
[im 70/70]
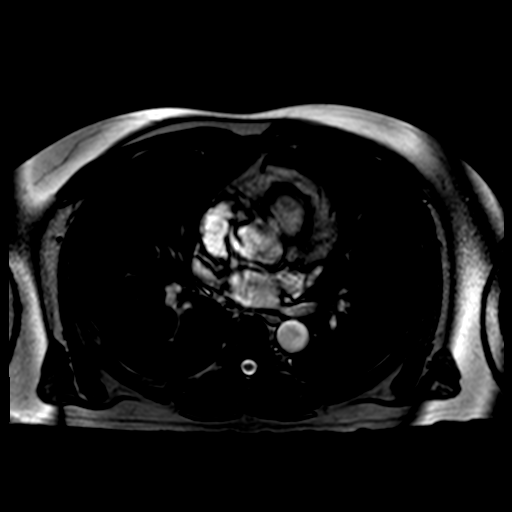

[Series 11: T1 · axial · 4.0mm · 0.66mm/px · z∈[-237,+15]mm · 3 of 64 slices shown (1 of 2)]
[im 1/64]
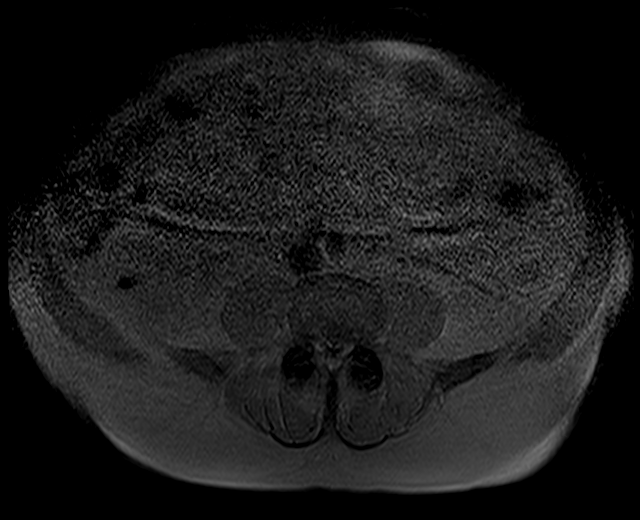
[im 32/64]
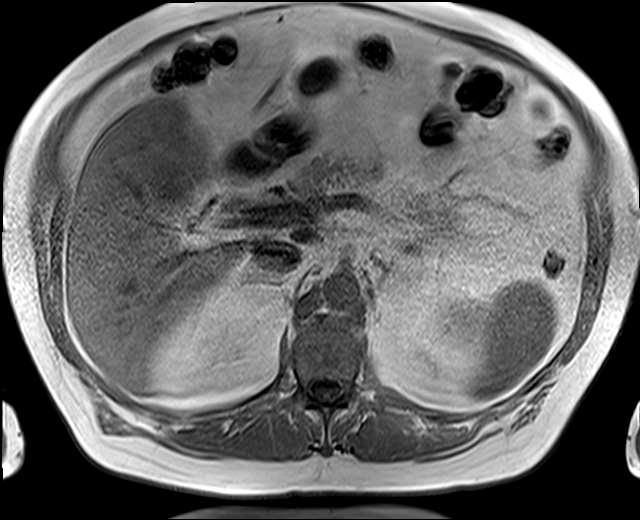
[im 64/64]
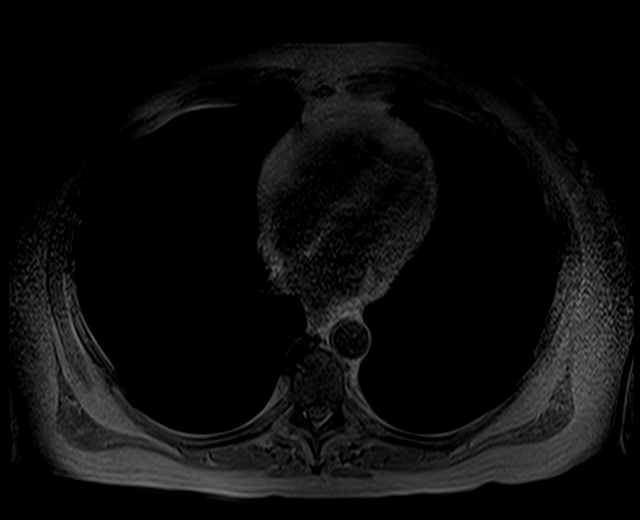

[Series 12: T1 · axial · 4.0mm · 0.66mm/px · z∈[-237,+15]mm · 3 of 64 slices shown (2 of 2)]
[im 1/64]
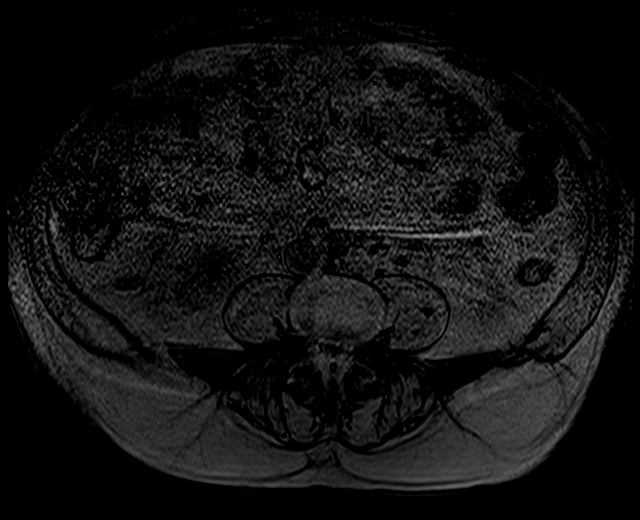
[im 32/64]
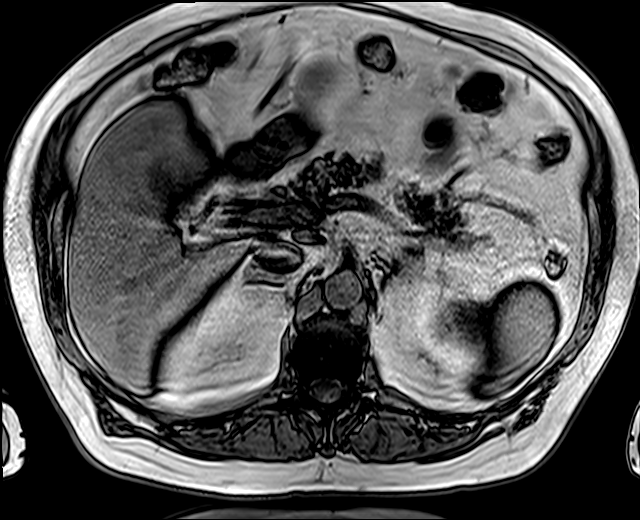
[im 64/64]
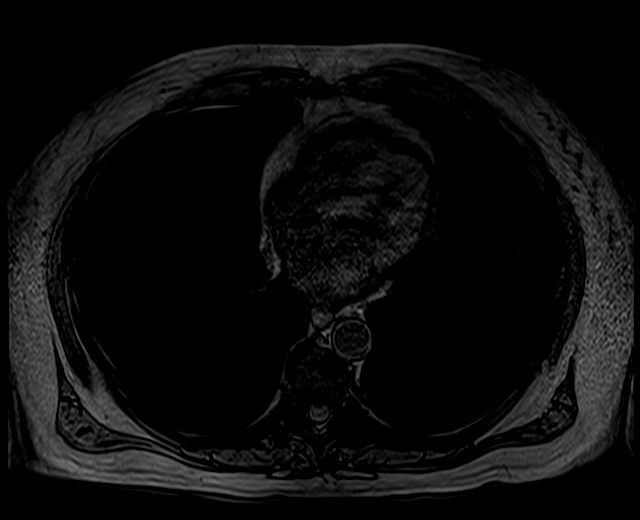

[Series 23: t1fs coronal post · coronal · 2.5mm · 1.46mm/px · 4 of 96 slices shown]
[im 1/96]
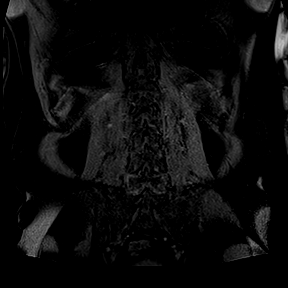
[im 32/96]
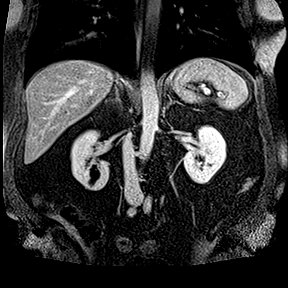
[im 64/96]
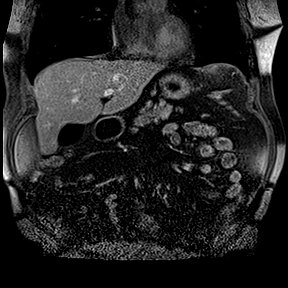
[im 96/96]
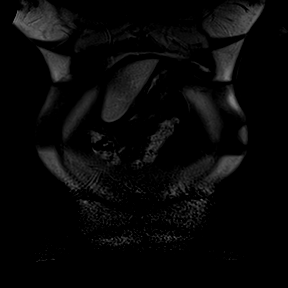

[Series 24: T2 · axial · 5.0mm · 1.64mm/px · z∈[-228,+6]mm · 2 of 40 slices shown (2 of 2)]
[im 1/40]
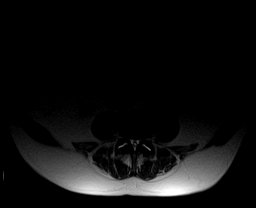
[im 40/40]
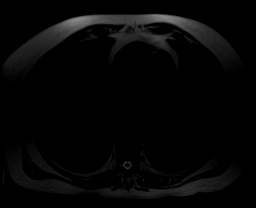

[Series 25: 5 min delay · axial · delayed · 3.5mm · 0.66mm/px · z∈[-235,+13]mm · 3 of 72 slices shown]
[im 1/72]
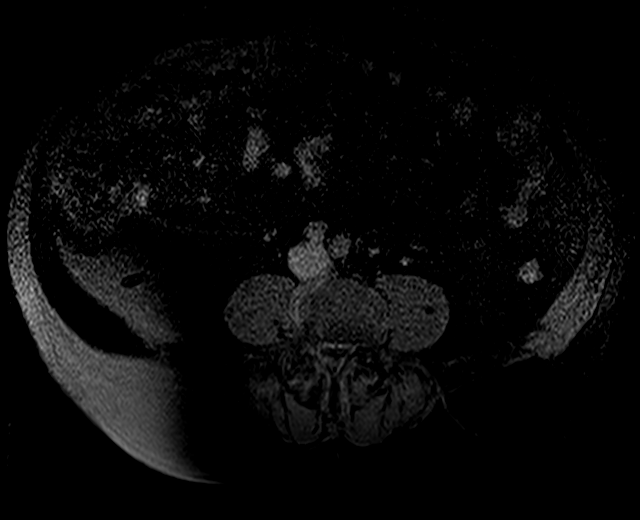
[im 36/72]
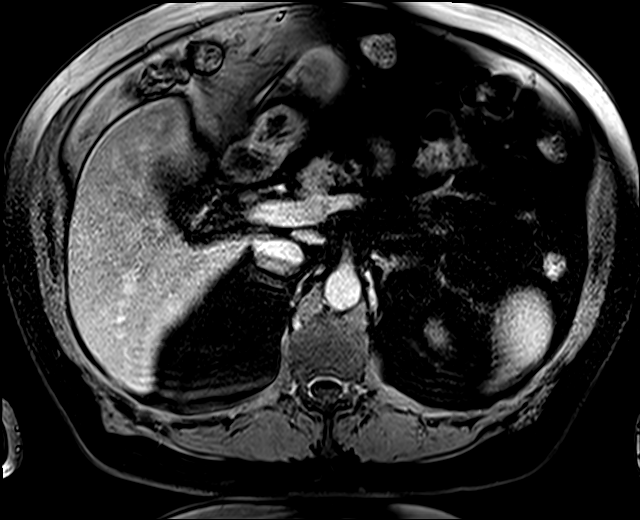
[im 72/72]
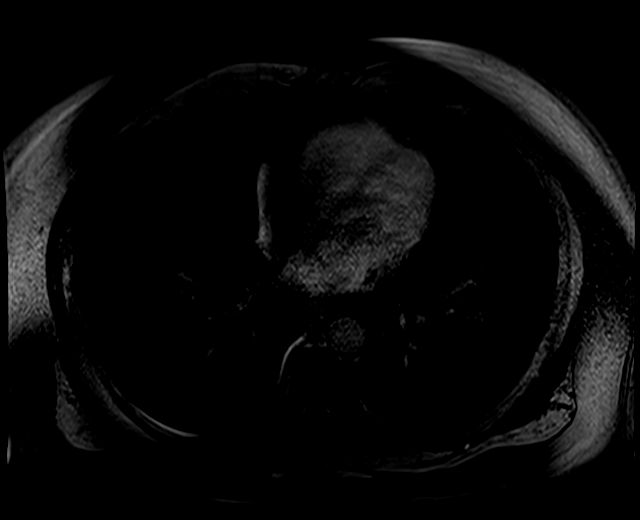

[24 of 48 positions shown; findings below may reference images not displayed]

FINDINGS: Lower chest: No acute findings.

Hepatobiliary: A few tiny hepatic cysts are seen, largest in the
posterior right hepatic lobe measuring 1.4 cm. There is also a
heterogeneously enhancing mass in the inferior right hepatic lobe
measuring 3.8 x 3.1 cm on image [id number]. This lesion shows mild
heterogeneous T2 hyperintensity. No other solid liver masses
identified.

Gallbladder is unremarkable. No evidence of biliary ductal
dilatation.

Pancreas:  No mass or inflammatory changes.

Spleen:  Within normal limits in size and appearance.

Adrenals/Urinary Tract: No masses identified. Small cyst noted in
lower pole of right kidney. No evidence of hydronephrosis.

Stomach/Bowel: Visualized portion unremarkable.

Vascular/Lymphatic: No pathologically enlarged lymph nodes
identified. No abdominal aortic aneurysm.

Other:  None.

Musculoskeletal:  No suspicious bone lesions identified.
IMPRESSION: 3.8 cm indeterminate mass in the inferior right hepatic lobe.
Malignancy cannot be excluded. Consider percutaneous needle biopsy
for tissue diagnosis.

## 2020-01-22 ENCOUNTER — Encounter: Payer: Self-pay | Admitting: Family Medicine

## 2020-01-22 DIAGNOSIS — M791 Myalgia, unspecified site: Secondary | ICD-10-CM

## 2020-01-22 NOTE — Telephone Encounter (Signed)
Rheumatology would be a good specialty for him to see to help delineate the underlying issues of this/in other words figure out the cause.  Please help with initiating referral thank you

## 2020-01-22 NOTE — Addendum Note (Signed)
Addended by: Vicente Males on: 01/22/2020 02:24 PM   Modules accepted: Orders

## 2020-01-29 ENCOUNTER — Telehealth: Payer: Self-pay | Admitting: Radiology

## 2020-01-29 NOTE — Telephone Encounter (Signed)
Spoke with patient's wife, they would like to be notified of patient's lab results and whether or not Dr. Rice is going to start the patient on a new medication. Please advise.  °

## 2020-01-29 NOTE — Telephone Encounter (Signed)
Spoke with patient, he returned a call to our office to schedule a new patient appointment. Advised patient we would be in touch tomorrow to get an appointment scheduled.

## 2020-01-29 NOTE — Telephone Encounter (Signed)
Please disregard previous message: Spoke with patient's wife, they would like to be notified of patient's lab results and whether or not Dr. Dimple Casey is going to start the patient on a new medication. Please advise.   Intended for another patient.

## 2020-02-14 ENCOUNTER — Encounter: Payer: Self-pay | Admitting: Family Medicine

## 2020-02-29 ENCOUNTER — Encounter: Payer: Self-pay | Admitting: Internal Medicine

## 2020-03-06 ENCOUNTER — Ambulatory Visit: Payer: BC Managed Care – PPO | Admitting: Internal Medicine

## 2020-03-18 ENCOUNTER — Other Ambulatory Visit: Payer: Self-pay

## 2020-03-18 ENCOUNTER — Ambulatory Visit (INDEPENDENT_AMBULATORY_CARE_PROVIDER_SITE_OTHER): Payer: BC Managed Care – PPO | Admitting: Family Medicine

## 2020-03-18 ENCOUNTER — Encounter: Payer: Self-pay | Admitting: Family Medicine

## 2020-03-18 VITALS — BP 140/90 | HR 76 | Temp 97.1°F | Ht 68.5 in | Wt 229.0 lb

## 2020-03-18 DIAGNOSIS — Z125 Encounter for screening for malignant neoplasm of prostate: Secondary | ICD-10-CM | POA: Diagnosis not present

## 2020-03-18 DIAGNOSIS — R0981 Nasal congestion: Secondary | ICD-10-CM | POA: Diagnosis not present

## 2020-03-18 DIAGNOSIS — E782 Mixed hyperlipidemia: Secondary | ICD-10-CM | POA: Diagnosis not present

## 2020-03-18 DIAGNOSIS — F411 Generalized anxiety disorder: Secondary | ICD-10-CM | POA: Diagnosis not present

## 2020-03-18 DIAGNOSIS — Z8505 Personal history of malignant neoplasm of liver: Secondary | ICD-10-CM

## 2020-03-18 MED ORDER — PRAVASTATIN SODIUM 20 MG PO TABS: 20.0000 mg | ORAL_TABLET | Freq: Every day | ORAL | 1 refills | Status: DC

## 2020-03-18 MED ORDER — ALPRAZOLAM 0.5 MG PO TABS: ORAL_TABLET | ORAL | 5 refills | Status: DC

## 2020-03-18 NOTE — Progress Notes (Signed)
   Subjective:    Patient ID: Logan Alvarez, male    DOB: 1955-03-22, 65 y.o.   MRN: [id number]  Hypertension This is a chronic problem. The current episode started more than 1 year ago. Pertinent negatives include no chest pain, headaches or shortness of breath.    Patient also has concerns about his sinus congestion and left ear stuffiness Primary hypertension  Mixed hyperlipidemia - Plan: Comprehensive metabolic panel, Lipid panel  Screening PSA (prostate specific antigen) - Plan: PSA  Sinus congestion  GAD (generalized anxiety disorder)  History of hepatocellular carcinoma - Plan: Comprehensive metabolic panel Drug registry was checked Patient has not tolerated serotonin medications Has been on Xanax for years denies abusing it Patient had been treated for hepatocellular carcinoma now doing well has had a negative MRI on most recent visit Mild obesity he is working hard to try to lose weight Still works full-time at least for 1 more year    Review of Systems  Constitutional: Negative for activity change.  HENT: Negative for congestion and rhinorrhea.   Respiratory: Negative for cough and shortness of breath.   Cardiovascular: Negative for chest pain.  Gastrointestinal: Negative for abdominal pain, diarrhea, nausea and vomiting.  Genitourinary: Negative for dysuria and hematuria.  Neurological: Negative for weakness and headaches.  Psychiatric/Behavioral: Negative for behavioral problems and confusion.       Objective:   Physical Exam Vitals reviewed.  Constitutional:      General: He is not in acute distress.    Appearance: He is well-nourished.  HENT:     Head: Normocephalic and atraumatic.  Eyes:     General:        Right eye: No discharge.        Left eye: No discharge.  Neck:     Trachea: No tracheal deviation.  Cardiovascular:     Rate and Rhythm: Normal rate and regular rhythm.     Heart sounds: Normal heart sounds. No murmur heard.   Pulmonary:      Effort: Pulmonary effort is normal. No respiratory distress.     Breath sounds: Normal breath sounds.  Musculoskeletal:        General: No edema.  Lymphadenopathy:     Cervical: No cervical adenopathy.  Skin:    General: Skin is warm and dry.  Neurological:     Mental Status: He is alert.     Coordination: Coordination normal.  Psychiatric:        Mood and Affect: Mood and affect normal.        Behavior: Behavior normal.           Assessment & Plan:  Arthralgia resolved- daily mens vitamin and magnesium He states his arthralgias resolved no further work-up needed patient did not go to rheumatology  1. Mixed hyperlipidemia Watch diet continue medication check lab work await results - Comprehensive metabolic panel - Lipid panel  2. Screening PSA (prostate specific antigen) Screening PSA with wellness on next visit - PSA  3. Sinus congestion Saline nasal spray Flonase as needed Allegra as needed  4. GAD (generalized anxiety disorder) Xanax refills given patient was encouraged to gradually taper down to see if he can get down to 2 or less per day if possible  5. History of hepatocellular carcinoma No sign of recurrence but does need lab work before his next visit - Comprehensive metabolic panel

## 2020-04-16 ENCOUNTER — Telehealth: Payer: Self-pay | Admitting: Internal Medicine

## 2020-04-16 NOTE — Telephone Encounter (Signed)
Pt is aware of his OV with Dr Abbey Chatters next week, but he is wanting a cost estimate of what his procedure was going to cost him. He has insurance with [employer]. I told him his insurance should pay a portion if not all but he would need to contact them. He still wants an estimate from [place]. Please advise. [phone]

## 2020-04-17 ENCOUNTER — Encounter (INDEPENDENT_AMBULATORY_CARE_PROVIDER_SITE_OTHER): Payer: Self-pay

## 2020-04-17 ENCOUNTER — Telehealth: Payer: Self-pay | Admitting: Internal Medicine

## 2020-04-17 NOTE — Telephone Encounter (Signed)
[phone] PLEASE CALL PATIENT ABOUT HIS COLONOSCOPY COST. CAN HE PLEASE BE CALLED AROUND 4PM IN THE EVENINGS,

## 2020-04-17 NOTE — Telephone Encounter (Signed)
I will send you a staff message.  Until scheduled might not know exactly if any other tests ordered too.

## 2020-04-25 ENCOUNTER — Encounter: Payer: Self-pay | Admitting: Internal Medicine

## 2020-04-25 ENCOUNTER — Other Ambulatory Visit: Payer: Self-pay

## 2020-04-25 ENCOUNTER — Ambulatory Visit (INDEPENDENT_AMBULATORY_CARE_PROVIDER_SITE_OTHER): Payer: BC Managed Care – PPO | Admitting: Internal Medicine

## 2020-04-25 VITALS — BP 165/94 | HR 72 | Temp 96.8°F | Ht 68.0 in | Wt 231.2 lb

## 2020-04-25 DIAGNOSIS — Z86010 Personal history of colonic polyps: Secondary | ICD-10-CM

## 2020-04-25 DIAGNOSIS — K6289 Other specified diseases of anus and rectum: Secondary | ICD-10-CM | POA: Diagnosis not present

## 2020-04-25 NOTE — Patient Instructions (Signed)
We will schedule you for colonoscopy for surveillance purposes due to history of numerous colon polyps in the past.  Follow-up with [facility] regarding your previously resected liver cancer  At [facility] we value your feedback. You may receive a survey about your visit today. Please share your experience as we strive to create trusting relationships with our patients to provide genuine, compassionate, quality care.  We appreciate your understanding and patience as we review any laboratory studies, imaging, and other diagnostic tests that are ordered as we care for you. Our office policy is 5 business days for review of these results, and any emergent or urgent results are addressed in a timely manner for your best interest. If you do not hear from our office in 1 week, please contact us.   We also encourage the use of MyChart, which contains your medical information for your review as well. If you are not enrolled in this feature, an access code is on this after visit summary for your convenience. Thank you for allowing [place] to be involved in your care.  It was great to see you today!  I hope you have a great rest of your spring!!    Elon Alas. Abbey Chatters, D.O. Gastroenterology and Hepatology [facility]

## 2020-04-25 NOTE — Progress Notes (Signed)
Primary Care Physician:  Kathyrn Drown, MD Primary Gastroenterologist:  Dr. Abbey Chatters  Chief Complaint  Patient presents with   Colonoscopy    Due for repeat d/t hx polyps    HPI:   Logan Alvarez is a 65 y.o. male who presents to the clinic today to discuss colonoscopy.  Last colonoscopy 2019 with 16 polyps removed.  Also has a history of hepatocellular carcinoma status post resection.  Interestingly his liver biopsies did not show fibrosis.  He follows up with [facility] for this regularly, most recent MRI showed no recurrence of disease.  Patient denies any family history of colorectal malignancy.  No melena hematochezia.  Does note some rectal discomfort at times with bowel movements.  Otherwise no other complaints.  Past Medical History:  Diagnosis Date   Chest pain    a. 2008: Normal ETT;  b. 01/2013 CTA: Ca score of 715 (92%'ile), LAD 50p, D1 50-75, RCA 50p.   Hyperlipidemia    Hypertension    Testosterone deficiency 2009    Past Surgical History:  Procedure Laterality Date   COLONOSCOPY N/A 03/12/2017   Procedure: COLONOSCOPY;  Surgeon: Danie Binder, MD;  Location: [facility];  Service: Endoscopy;  Laterality: N/A;  2:00   LEFT HEART CATHETERIZATION WITH CORONARY ANGIOGRAM N/A 02/01/2013   Procedure: LEFT HEART CATHETERIZATION WITH CORONARY ANGIOGRAM;  Surgeon: Josue Hector, MD;  Location: [facility] CATH LAB;  Service: Cardiovascular;  Laterality: N/A;   NECK SURGERY      Current Outpatient Medications  Medication Sig Dispense Refill   albuterol (PROVENTIL HFA;VENTOLIN HFA) 108 (90 Base) MCG/ACT inhaler Inhale 2 puffs into the lungs every 4 (four) hours as needed for wheezing. 1 Inhaler 2   ALPRAZolam (XANAX) 0.5 MG tablet TAKE 1 TABLET THREE TIMES A DAY AS NEEDED FOR ANXIETY. 90 tablet 5   ibuprofen (ADVIL,MOTRIN) 200 MG tablet Take 400 mg by mouth every 8 (eight) hours as needed (for pain.).     Multiple Vitamin (MULTIVITAMIN) tablet Take 1 tablet by mouth  daily.     pravastatin (PRAVACHOL) 20 MG tablet Take 1 tablet (20 mg total) by mouth daily. 90 tablet 1   No current facility-administered medications for this visit.    Allergies as of 04/25/2020 - Review Complete 04/25/2020  Allergen Reaction Noted   Cymbalta [duloxetine hcl]  08/28/2016   Lodine [etodolac] Nausea And Vomiting 05/20/2012   Zoloft [sertraline hcl]  01/29/2017    Family History  Problem Relation Age of Onset   Heart attack Father        54's    Social History   Socioeconomic History   Marital status: Widowed    Spouse name: Not on file   Number of children: Not on file   Years of education: Not on file   Highest education level: Not on file  Occupational History   Not on file  Tobacco Use   Smoking status: Never Smoker   Smokeless tobacco: Never Used  Vaping Use   Vaping Use: Never used  Substance and Sexual Activity   Alcohol use: Yes    Comment: 4-6 beers a day   Drug use: No   Sexual activity: Not on file  Other Topics Concern   Not on file  Social History Narrative   Not on file   Social Determinants of Health   Financial Resource Strain: Not on file  Food Insecurity: Not on file  Transportation Needs: Not on file  Physical Activity: Not on  file  Stress: Not on file  Social Connections: Not on file  Intimate Partner Violence: Not on file    Subjective: Review of Systems  Constitutional: Negative for chills and fever.  HENT: Negative for congestion and hearing loss.   Eyes: Negative for blurred vision and double vision.  Respiratory: Negative for cough and shortness of breath.   Cardiovascular: Negative for chest pain and palpitations.  Gastrointestinal: Negative for abdominal pain, blood in stool, constipation, diarrhea, heartburn, melena and vomiting.  Genitourinary: Negative for dysuria and urgency.  Musculoskeletal: Negative for joint pain and myalgias.  Skin: Negative for itching and rash.  Neurological:  Negative for dizziness and headaches.  Psychiatric/Behavioral: Negative for depression. The patient is not nervous/anxious.        Objective: BP (!) 165/94    Pulse 72    Temp (!) 96.8 F (36 C)    Ht 5\' 8"  (1.727 m)    Wt 231 lb 3.2 oz (104.9 kg)    BMI 35.15 kg/m  Physical Exam Constitutional:      Appearance: Normal appearance.  HENT:     Head: Normocephalic and atraumatic.  Eyes:     Extraocular Movements: Extraocular movements intact.     Conjunctiva/sclera: Conjunctivae normal.  Cardiovascular:     Rate and Rhythm: Normal rate and regular rhythm.  Pulmonary:     Effort: Pulmonary effort is normal.     Breath sounds: Normal breath sounds.  Abdominal:     General: Bowel sounds are normal.     Palpations: Abdomen is soft.  Musculoskeletal:        General: Normal range of motion.     Cervical back: Normal range of motion and neck supple.  Skin:    General: Skin is warm.  Neurological:     General: No focal deficit present.     Mental Status: He is alert and oriented to person, place, and time.  Psychiatric:        Mood and Affect: Mood normal.        Behavior: Behavior normal.      Assessment: *Adenomatous colon polyps *Hepatocellular carcinoma status post resection-follows with [facility] *Rectal pain  Plan: Will schedule for  surveillance colonoscopy.The risks including infection, bleed, or perforation as well as benefits, limitations, alternatives and imponderables have been reviewed with the patient. Questions have been answered. All parties agreeable.  Patient to continue to follow-up with [facility] regarding his history of hepatocellular carcinoma with  04/25/2020 4:01 PM   Disclaimer: This note was dictated with voice recognition software. Similar sounding words can inadvertently be transcribed and may not be corrected upon review.

## 2020-04-29 ENCOUNTER — Encounter: Payer: Self-pay | Admitting: *Deleted

## 2020-04-29 NOTE — Telephone Encounter (Signed)
[facility] message sent to patient advising phone not working. Letter also mailed

## 2020-05-06 ENCOUNTER — Telehealth: Payer: Self-pay | Admitting: *Deleted

## 2020-05-06 NOTE — Telephone Encounter (Signed)
Patient returned call. He wanted several dates to provide his transportation. Provided several Tuesday dates in May to patient. He wants afternoon. Advised the hospital would advise arrival time. Will await pt call back

## 2020-05-07 ENCOUNTER — Encounter: Payer: Self-pay | Admitting: *Deleted

## 2020-05-07 MED ORDER — PEG 3350-KCL-NA BICARB-NACL 420 G PO SOLR: ORAL | 0 refills | Status: DC

## 2020-05-07 NOTE — Telephone Encounter (Addendum)
Received VM from patient he wants to schedule procedure on 5/17. Instructions with pre-op/covid test appt sent. Rx sent to pharmacy. Tried to call patient but line will not ring or go through.

## 2020-05-07 NOTE — Addendum Note (Signed)
Addended by: Cheron Every on: 05/07/2020 09:18 AM   Modules accepted: Orders

## 2020-05-16 DIAGNOSIS — E782 Mixed hyperlipidemia: Secondary | ICD-10-CM | POA: Diagnosis not present

## 2020-05-16 DIAGNOSIS — I10 Essential (primary) hypertension: Secondary | ICD-10-CM | POA: Diagnosis not present

## 2020-05-16 DIAGNOSIS — Z125 Encounter for screening for malignant neoplasm of prostate: Secondary | ICD-10-CM | POA: Diagnosis not present

## 2020-05-17 LAB — COMPREHENSIVE METABOLIC PANEL
ALT: 15 IU/L (ref 0–44)
AST: 15 IU/L (ref 0–40)
Albumin/Globulin Ratio: 1.5 (ref 1.2–2.2)
Albumin: 4.4 g/dL (ref 3.8–4.8)
Alkaline Phosphatase: 154 IU/L — ABNORMAL HIGH (ref 44–121)
BUN/Creatinine Ratio: 10 (ref 10–24)
BUN: 7 mg/dL — ABNORMAL LOW (ref 8–27)
Bilirubin Total: 1.2 mg/dL (ref 0.0–1.2)
CO2: 20 mmol/L (ref 20–29)
Calcium: 9.9 mg/dL (ref 8.6–10.2)
Chloride: 95 mmol/L — ABNORMAL LOW (ref 96–106)
Creatinine, Ser: 0.72 mg/dL — ABNORMAL LOW (ref 0.76–1.27)
Globulin, Total: 3 g/dL (ref 1.5–4.5)
Glucose: 112 mg/dL — ABNORMAL HIGH (ref 65–99)
Potassium: 5 mmol/L (ref 3.5–5.2)
Sodium: 130 mmol/L — ABNORMAL LOW (ref 134–144)
Total Protein: 7.4 g/dL (ref 6.0–8.5)
eGFR: 101 mL/min/{1.73_m2} (ref >59)

## 2020-05-17 LAB — LIPID PANEL
Chol/HDL Ratio: 3.6 ratio (ref 0.0–5.0)
Cholesterol, Total: 168 mg/dL (ref 100–199)
HDL: 47 mg/dL (ref >39)
LDL Chol Calc (NIH): 102 mg/dL — ABNORMAL HIGH (ref 0–99)
Triglycerides: 105 mg/dL (ref 0–149)
VLDL Cholesterol Cal: 19 mg/dL (ref 5–40)

## 2020-05-17 LAB — PSA: Prostate Specific Ag, Serum: 1.3 ng/mL (ref 0.0–4.0)

## 2020-06-04 ENCOUNTER — Other Ambulatory Visit: Payer: Self-pay

## 2020-06-04 ENCOUNTER — Ambulatory Visit (INDEPENDENT_AMBULATORY_CARE_PROVIDER_SITE_OTHER): Payer: BC Managed Care – PPO | Admitting: Family Medicine

## 2020-06-04 ENCOUNTER — Encounter: Payer: Self-pay | Admitting: Family Medicine

## 2020-06-04 VITALS — BP 126/82 | HR 95 | Temp 97.1°F | Ht 68.0 in | Wt 220.6 lb

## 2020-06-04 DIAGNOSIS — Z0000 Encounter for general adult medical examination without abnormal findings: Secondary | ICD-10-CM | POA: Diagnosis not present

## 2020-06-04 DIAGNOSIS — R509 Fever, unspecified: Secondary | ICD-10-CM

## 2020-06-04 DIAGNOSIS — F419 Anxiety disorder, unspecified: Secondary | ICD-10-CM | POA: Diagnosis not present

## 2020-06-04 DIAGNOSIS — I10 Essential (primary) hypertension: Secondary | ICD-10-CM

## 2020-06-04 DIAGNOSIS — E871 Hypo-osmolality and hyponatremia: Secondary | ICD-10-CM

## 2020-06-04 DIAGNOSIS — Z1211 Encounter for screening for malignant neoplasm of colon: Secondary | ICD-10-CM

## 2020-06-04 DIAGNOSIS — E782 Mixed hyperlipidemia: Secondary | ICD-10-CM

## 2020-06-04 DIAGNOSIS — W57XXXA Bitten or stung by nonvenomous insect and other nonvenomous arthropods, initial encounter: Secondary | ICD-10-CM

## 2020-06-04 DIAGNOSIS — S70361A Insect bite (nonvenomous), right thigh, initial encounter: Secondary | ICD-10-CM | POA: Diagnosis not present

## 2020-06-04 MED ORDER — DOXYCYCLINE HYCLATE 100 MG PO TABS: 100.0000 mg | ORAL_TABLET | Freq: Two times a day (BID) | ORAL | 0 refills | Status: DC

## 2020-06-04 MED ORDER — ALPRAZOLAM 0.5 MG PO TABS: ORAL_TABLET | ORAL | 0 refills | Status: DC

## 2020-06-04 MED ORDER — ALPRAZOLAM 0.5 MG PO TABS: ORAL_TABLET | ORAL | 2 refills | Status: DC

## 2020-06-04 NOTE — Progress Notes (Signed)
Subjective:    Patient ID: Logan Alvarez, male    DOB: July 06, 1955, 65 y.o.   MRN: [id number]  HPI  The patient comes in today for a wellness visit.  Well adult exam  Tick bite of right thigh, initial encounter - Plan: Lyme Ab/Western Blot Reflex, CBC with Differential/Platelet, Basic metabolic panel, Hepatic function panel, Rocky mtn spotted fvr abs pnl(IgG+IgM)  Febrile illness - Plan: Lyme Ab/Western Blot Reflex, CBC with Differential/Platelet, Basic metabolic panel, Hepatic function panel, Rocky mtn spotted fvr abs pnl(IgG+IgM)  Hyponatremia - Plan: Basic metabolic panel Patient does relate body aches some discomforts headaches not feeling good low-grade fever off and on the past few days since having a tick bite. Denies being depressed currently  A review of their health history was completed.  A review of medications was also completed.  Any needed refills; none  Eating habits: not eating too healthy- not feeling like eating lately- over 3 weeks  Falls/  MVA accidents in past few months: No  Regular exercise: yes  Specialist pt sees on regular basis: [facility] f/u on liver tumor   Preventative health issues were discussed.   Additional concerns: Patient with intermittent fevers and symptoms of Lyme's disease since tick bite 3 weeks ago   Review of Systems     Objective:   Physical Exam Constitutional:      Appearance: He is well-developed.  HENT:     Head: Normocephalic and atraumatic.     Right Ear: External ear normal.     Left Ear: External ear normal.     Nose: Nose normal.  Eyes:     Pupils: Pupils are equal, round, and reactive to light.  Neck:     Thyroid: No thyromegaly.  Cardiovascular:     Rate and Rhythm: Normal rate and regular rhythm.     Heart sounds: Normal heart sounds. No murmur heard.   Pulmonary:     Effort: Pulmonary effort is normal. No respiratory distress.     Breath sounds: Normal breath sounds. No wheezing.   Abdominal:     General: Bowel sounds are normal. There is no distension.     Palpations: Abdomen is soft. There is no mass.     Tenderness: There is no abdominal tenderness.  Genitourinary:    Prostate: Normal.  Musculoskeletal:        General: Normal range of motion.     Cervical back: Normal range of motion and neck supple.  Lymphadenopathy:     Cervical: No cervical adenopathy.  Skin:    General: Skin is warm and dry.     Findings: No erythema.  Neurological:     Mental Status: He is alert.     Motor: No abnormal muscle tone.  Psychiatric:        Behavior: Behavior normal.        Judgment: Judgment normal.    No rash seen Prostate exam normal Results for orders placed or performed in visit on 03/18/20  Comprehensive metabolic panel  Result Value Ref Range   Glucose 112 (H) 65 - 99 mg/dL   BUN 7 (L) 8 - 27 mg/dL   Creatinine, Ser 2.29 (L) 0.76 - 1.27 mg/dL   eGFR 798 >92 JJ/HER/7.40   BUN/Creatinine Ratio 10 10 - 24   Sodium 130 (L) 134 - 144 mmol/L   Potassium 5.0 3.5 - 5.2 mmol/L   Chloride 95 (L) 96 - 106 mmol/L   CO2 20 20 - 29 mmol/L  Calcium 9.9 8.6 - 10.2 mg/dL   Total Protein 7.4 6.0 - 8.5 g/dL   Albumin 4.4 3.8 - 4.8 g/dL   Globulin, Total 3.0 1.5 - 4.5 g/dL   Albumin/Globulin Ratio 1.5 1.2 - 2.2   Bilirubin Total 1.2 0.0 - 1.2 mg/dL   Alkaline Phosphatase 154 (H) 44 - 121 IU/L   AST 15 0 - 40 IU/L   ALT 15 0 - 44 IU/L  Lipid panel  Result Value Ref Range   Cholesterol, Total 168 100 - 199 mg/dL   Triglycerides 105 0 - 149 mg/dL   HDL 47 >39 mg/dL   VLDL Cholesterol Cal 19 5 - 40 mg/dL   LDL Chol Calc (NIH) 102 (H) 0 - 99 mg/dL   Chol/HDL Ratio 3.6 0.0 - 5.0 ratio  PSA  Result Value Ref Range   Prostate Specific Ag, Serum 1.3 0.0 - 4.0 ng/mL        Assessment & Plan:  1. Well adult exam Adult wellness-complete.wellness physical was conducted today. Importance of diet and exercise were discussed in detail.  In addition to this a discussion  regarding safety was also covered. We also reviewed over immunizations and gave recommendations regarding current immunization needed for age.  In addition to this additional areas were also touched on including: Preventative health exams needed:  Colonoscopy patient needs to do this by late summer  Patient was advised yearly wellness exam   2. Tick bite of right thigh, initial encounter Doxycycline twice daily over the course of the next 2 weeks with a snack and plenty of water warning signs discussed if severe progression of problems follow-up - Lyme Ab/Western Blot Reflex - CBC with Differential/Platelet - Basic metabolic panel - Hepatic function panel - Rocky mtn spotted fvr abs pnl(IgG+IgM)  3. Febrile illness Could be virus but could also be tick related illness go ahead and run test go ahead with doxycycline - Lyme Ab/Western Blot Reflex - CBC with Differential/Platelet - Basic metabolic panel - Hepatic function panel - Rocky mtn spotted fvr abs pnl(IgG+IgM)  4. Hyponatremia Sodium low on previous lab work repeat this lab work if it continuing to trend will need work-up - Basic metabolic panel  5. Primary hypertension Blood pressure good control continue current measures  6. Mixed hyperlipidemia Cholesterol decent control continue current measures  7. Anxiety Takes his Xanax but on a smaller male we will gradually taper down the dosing to no more than 60 tablets a month he has been encouraged to continue to keep tapering down on management

## 2020-06-04 NOTE — Patient Instructions (Signed)
Shingrix and shingles prevention: know the facts!   Shingrix is a very effective vaccine to prevent shingles.   Shingles is a reactivation of chickenpox -more than 99% of Americans born before 1980 have had chickenpox even if they do not remember it. One in every 10 people who get shingles have severe long-lasting nerve pain as a result.   33 out of a 100 older adults will get shingles if they are unvaccinated.     This vaccine is very important for your health This vaccine is indicated for anyone 50 years or older. You can get this vaccine even if you have already had shingles because you can get the disease more than once in a lifetime.  Your risk for shingles and its complications increases with age.  This vaccine has 2 doses.  The second dose would be 2 to 6 months after the first dose.  If you had Zostavax vaccine in the past you should still get Shingrix. ( Zostavax is only 70% effective and it loses significant strength over a few years .)  This vaccine is given through the pharmacy.  The cost of the vaccine is through your insurance. The pharmacy can inform you of the total costs.  Common side effects including soreness in the arm, some redness and swelling, also some feel fatigue muscle soreness headache low-grade fever.  Side effects typically go away within 2 to 3 days. Remember-the pain from shingles can last a lifetime but these side effects of the vaccine will only last a few days at most. It is very important to get both doses in order to protect yourself fully.   Please get this vaccine at your earliest convenience at your trusted pharmacy.       Tick Bite Information, Adult  Ticks are insects that can bite. Most ticks live in shrubs and grassy areas. They climb onto people and animals that go by. Then they bite. Some ticks carry germs that can make you sick. How can I prevent tick bites? Take these steps: Use insect repellent  Use an insect repellent that has  20% or higher of the ingredients DEET, picaridin, or IR3535. Follow the instructions on the label. Put it on: ? Bare skin. ? The tops of your boots. ? Your pant legs. ? The ends of your sleeves.  If you use an insect repellent that has the ingredient permethrin, follow the instructions on the label. Put it on: ? Clothing. ? Boots. ? Supplies or outdoor gear. ? Tents. When you are outside  Wear long sleeves and long pants.  Wear light-colored clothes.  Tuck your pant legs into your socks.  Stay in the middle of the trail. Do not touch the bushes.  Avoid walking through long grass.  Check for ticks on your clothes, hair, and skin often while you are outside. Before going inside your house, check your clothes, skin, head, neck, armpits, waist, groin, and joint areas. When you go indoors  Check your clothes for ticks. Dry your clothes in a dryer on high heat for 10 minutes or more. If clothes are damp, additional time may be needed.  Wash your clothes right away if they need to be washed. Use hot water.  Check your pets and outdoor gear.  Shower right away.  Check your body for ticks. Do a full body check using a mirror. What is the right way to remove a tick? Remove the tick from your skin as soon as possible. Do not remove the  tick with your bare fingers.  To remove a tick that is crawling on your skin: ? Go outdoors and brush the tick off. ? Use tape or a lint roller.  To remove a tick that is biting: 1. Wash your hands. 2. If you have latex gloves, put them on. 3. Use tweezers, curved forceps, or a tick-removal tool to grasp the tick. Grasp the tick as close to your skin and as close to the tick's head as possible. 4. Gently pull up until the tick lets go.  Try to keep the tick's head attached to its body.  Do not twist or jerk the tick.  Do not squeeze or crush the tick. Do not try to remove a tick with heat, alcohol, petroleum jelly, or fingernail polish.    What should I do after taking out a tick?  Throw away the tick. Do not crush a tick with your fingers.  Clean the bite area and your hands with soap and water, rubbing alcohol, or an iodine wash.  If an antiseptic cream or ointment is available, apply a small amount to the bite area.  Wash and disinfect any instruments that you used to remove the tick. How should I get rid of a live tick? To dispose of a live tick, use one of these methods:  Place the tick in rubbing alcohol.  Place the tick in a bag or container you can close tightly.  Wrap the tick tightly in tape.  Flush the tick down the toilet. Contact a doctor if:  You have symptoms, such as: ? A fever or chills. ? A red rash that makes a circle (bull's-eye rash) in the bite area. ? Redness and swelling where the tick bit you. ? Headache. ? Pain in a muscle, joint, or bone. ? Being more tired than normal. ? Trouble walking or moving your legs. ? Numbness in your legs. ? Tender and swollen lymph glands.  A part of a tick breaks off and gets stuck in your skin. Get help right away if:  You cannot remove a tick.  You cannot move (have paralysis) or feel weak.  You are feeling worse or have new symptoms.  You find a tick that is biting you and filled with blood. This is important if you are in an area where diseases from ticks are common. Summary  Ticks may carry germs that can make you sick.  To prevent tick bites wear long sleeves, long pants, and light colors. Use insect repellent. Follow the instructions on the label.  If the tick is biting, do not try to remove it with heat, alcohol, petroleum jelly, or fingernail polish.  Use tweezers, curved forceps, or a tick-removal tool to grasp the tick. Gently pull up until the tick lets go. Do not twist or jerk the tick. Do not squeeze or crush the tick.  If you have symptoms, contact a doctor. This information is not intended to replace advice given to you by  your health care provider. Make sure you discuss any questions you have with your health care provider. Document Revised: 01/16/2019 Document Reviewed: 01/16/2019 Elsevier Patient Education  2021 [employer].

## 2020-06-05 DIAGNOSIS — R1900 Intra-abdominal and pelvic swelling, mass and lump, unspecified site: Secondary | ICD-10-CM | POA: Diagnosis not present

## 2020-06-05 DIAGNOSIS — C220 Liver cell carcinoma: Secondary | ICD-10-CM | POA: Diagnosis not present

## 2020-06-05 DIAGNOSIS — Z9889 Other specified postprocedural states: Secondary | ICD-10-CM | POA: Diagnosis not present

## 2020-06-05 DIAGNOSIS — K8689 Other specified diseases of pancreas: Secondary | ICD-10-CM | POA: Diagnosis not present

## 2020-06-05 NOTE — Addendum Note (Signed)
Addended by: Dairl Ponder on: 06/05/2020 08:59 AM   Modules accepted: Orders

## 2020-06-06 LAB — HEPATIC FUNCTION PANEL
ALT: 16 IU/L (ref 0–44)
AST: 14 IU/L (ref 0–40)
Albumin: 4.4 g/dL (ref 3.8–4.8)
Alkaline Phosphatase: 138 IU/L — ABNORMAL HIGH (ref 44–121)
Bilirubin Total: 1.1 mg/dL (ref 0.0–1.2)
Bilirubin, Direct: 0.26 mg/dL (ref 0.00–0.40)
Total Protein: 7.7 g/dL (ref 6.0–8.5)

## 2020-06-06 LAB — CBC WITH DIFFERENTIAL/PLATELET
Basophils Absolute: 0.1 10*3/uL (ref 0.0–0.2)
Basos: 1 %
EOS (ABSOLUTE): 0.2 10*3/uL (ref 0.0–0.4)
Eos: 2 %
Hematocrit: 44.2 % (ref 37.5–51.0)
Hemoglobin: 14.7 g/dL (ref 13.0–17.7)
Immature Grans (Abs): 0 10*3/uL (ref 0.0–0.1)
Immature Granulocytes: 0 %
Lymphocytes Absolute: 1.7 10*3/uL (ref 0.7–3.1)
Lymphs: 16 %
MCH: 29.4 pg (ref 26.6–33.0)
MCHC: 33.3 g/dL (ref 31.5–35.7)
MCV: 88 fL (ref 79–97)
Monocytes Absolute: 1.1 10*3/uL — ABNORMAL HIGH (ref 0.1–0.9)
Monocytes: 11 %
Neutrophils Absolute: 7.1 10*3/uL — ABNORMAL HIGH (ref 1.4–7.0)
Neutrophils: 70 %
Platelets: 410 10*3/uL (ref 150–450)
RBC: 5 x10E6/uL (ref 4.14–5.80)
RDW: 11.9 % (ref 11.6–15.4)
WBC: 10.1 10*3/uL (ref 3.4–10.8)

## 2020-06-06 LAB — BASIC METABOLIC PANEL
BUN/Creatinine Ratio: 8 — ABNORMAL LOW (ref 10–24)
BUN: 6 mg/dL — ABNORMAL LOW (ref 8–27)
CO2: 23 mmol/L (ref 20–29)
Calcium: 10.2 mg/dL (ref 8.6–10.2)
Chloride: 100 mmol/L (ref 96–106)
Creatinine, Ser: 0.74 mg/dL — ABNORMAL LOW (ref 0.76–1.27)
Glucose: 108 mg/dL — ABNORMAL HIGH (ref 65–99)
Potassium: 5.5 mmol/L — ABNORMAL HIGH (ref 3.5–5.2)
Sodium: 140 mmol/L (ref 134–144)
eGFR: 101 mL/min/{1.73_m2} (ref >59)

## 2020-06-06 LAB — ROCKY MTN SPOTTED FVR ABS PNL(IGG+IGM)
RMSF IgG: NEGATIVE
RMSF IgM: 0.84 index (ref 0.00–0.89)

## 2020-06-07 ENCOUNTER — Other Ambulatory Visit: Payer: Self-pay | Admitting: *Deleted

## 2020-06-07 DIAGNOSIS — E875 Hyperkalemia: Secondary | ICD-10-CM

## 2020-06-11 NOTE — Patient Instructions (Signed)
Logan Alvarez  06/11/2020     @PREFPERIOPPHARMACY @   Your procedure is scheduled on  06/18/2020.   Report to [facility] at  1000  A.M.   Call this number if you have problems the morning of surgery:  [phone]   Remember:  Follow the diet and prep instructions given to you by the office.                     Take these medicines the morning of surgery with A SIP OF WATER    Xanax (if needed).  Use your inhaler before you come and bring your rescue inhaler with you.     Please brush your teeth.  Do not wear jewelry, make-up or nail polish.  Do not wear lotions, powders, or perfumes, or deodorant.  Do not shave 48 hours prior to surgery.  Men may shave face and neck.  Do not bring valuables to the hospital.  [facility] is not responsible for any belongings or valuables.  Contacts, dentures or bridgework may not be worn into surgery.  Leave your suitcase in the car.  After surgery it may be brought to your room.  For patients admitted to the hospital, discharge time will be determined by your treatment team.  Patients discharged the day of surgery will not be allowed to drive home and must have someone with them for 24 hours.   Special instructions:  DO NOT smoke tobacco or vape for 24 hours before your procedure.  Please read over the following fact sheets that you were given. Anesthesia Post-op Instructions and Care and Recovery After Surgery       Colonoscopy, Adult, Care After This sheet gives you information about how to care for yourself after your procedure. Your health care provider may also give you more specific instructions. If you have problems or questions, contact your health care provider. What can I expect after the procedure? After the procedure, it is common to have:  A small amount of blood in your stool for 24 hours after the procedure.  Some gas.  Mild cramping or bloating of your abdomen. Follow these instructions at home: Eating  and drinking  Drink enough fluid to keep your urine pale yellow.  Follow instructions from your health care provider about eating or drinking restrictions.  Resume your normal diet as instructed by your health care provider. Avoid heavy or fried foods that are hard to digest.   Activity  Rest as told by your health care provider.  Avoid sitting for a long time without moving. Get up to take short walks every 1-2 hours. This is important to improve blood flow and breathing. Ask for help if you feel weak or unsteady.  Return to your normal activities as told by your health care provider. Ask your health care provider what activities are safe for you. Managing cramping and bloating  Try walking around when you have cramps or feel bloated.  Apply heat to your abdomen as told by your health care provider. Use the heat source that your health care provider recommends, such as a moist heat pack or a heating pad. ? Place a towel between your skin and the heat source. ? Leave the heat on for 20-30 minutes. ? Remove the heat if your skin turns bright red. This is especially important if you are unable to feel pain, heat, or cold. You may have a greater risk of getting burned.  General instructions  If you were given a sedative during the procedure, it can affect you for several hours. Do not drive or operate machinery until your health care provider says that it is safe.  For the first 24 hours after the procedure: ? Do not sign important documents. ? Do not drink alcohol. ? Do your regular daily activities at a slower pace than normal. ? Eat soft foods that are easy to digest.  Take over-the-counter and prescription medicines only as told by your health care provider.  Keep all follow-up visits as told by your health care provider. This is important. Contact a health care provider if:  You have blood in your stool 2-3 days after the procedure. Get help right away if you have:  More  than a small spotting of blood in your stool.  Large blood clots in your stool.  Swelling of your abdomen.  Nausea or vomiting.  A fever.  Increasing pain in your abdomen that is not relieved with medicine. Summary  After the procedure, it is common to have a small amount of blood in your stool. You may also have mild cramping and bloating of your abdomen.  If you were given a sedative during the procedure, it can affect you for several hours. Do not drive or operate machinery until your health care provider says that it is safe.  Get help right away if you have a lot of blood in your stool, nausea or vomiting, a fever, or increased pain in your abdomen. This information is not intended to replace advice given to you by your health care provider. Make sure you discuss any questions you have with your health care provider. Document Revised: 01/13/2019 Document Reviewed: 08/15/2018 Elsevier Patient Education  2021 [place] After This sheet gives you information about how to care for yourself after your procedure. Your health care provider may also give you more specific instructions. If you have problems or questions, contact your health care provider. What can I expect after the procedure? After the procedure, it is common to have:  Tiredness.  Forgetfulness about what happened after the procedure.  Impaired judgment for important decisions.  Nausea or vomiting.  Some difficulty with balance. Follow these instructions at home: For the time period you were told by your health care provider:  Rest as needed.  Do not participate in activities where you could fall or become injured.  Do not drive or use machinery.  Do not drink alcohol.  Do not take sleeping pills or medicines that cause drowsiness.  Do not make important decisions or sign legal documents.  Do not take care of children on your own.      Eating and  drinking  Follow the diet that is recommended by your health care provider.  Drink enough fluid to keep your urine pale yellow.  If you vomit: ? Drink water, juice, or soup when you can drink without vomiting. ? Make sure you have little or no nausea before eating solid foods. General instructions  Have a responsible adult stay with you for the time you are told. It is important to have someone help care for you until you are awake and alert.  Take over-the-counter and prescription medicines only as told by your health care provider.  If you have sleep apnea, surgery and certain medicines can increase your risk for breathing problems. Follow instructions from your health care provider about wearing your sleep device: ? Anytime you are  sleeping, including during daytime naps. ? While taking prescription pain medicines, sleeping medicines, or medicines that make you drowsy.  Avoid smoking.  Keep all follow-up visits as told by your health care provider. This is important. Contact a health care provider if:  You keep feeling nauseous or you keep vomiting.  You feel light-headed.  You are still sleepy or having trouble with balance after 24 hours.  You develop a rash.  You have a fever.  You have redness or swelling around the IV site. Get help right away if:  You have trouble breathing.  You have new-onset confusion at home. Summary  For several hours after your procedure, you may feel tired. You may also be forgetful and have poor judgment.  Have a responsible adult stay with you for the time you are told. It is important to have someone help care for you until you are awake and alert.  Rest as told. Do not drive or operate machinery. Do not drink alcohol or take sleeping pills.  Get help right away if you have trouble breathing, or if you suddenly become confused. This information is not intended to replace advice given to you by your health care provider. Make sure  you discuss any questions you have with your health care provider. Document Revised: 10/05/2019 Document Reviewed: 12/22/2018 Elsevier Patient Education  2021 [employer].

## 2020-06-14 ENCOUNTER — Encounter: Payer: Self-pay | Admitting: Family Medicine

## 2020-06-14 ENCOUNTER — Other Ambulatory Visit: Payer: Self-pay

## 2020-06-14 ENCOUNTER — Encounter (HOSPITAL_COMMUNITY): Payer: Self-pay

## 2020-06-14 ENCOUNTER — Other Ambulatory Visit (HOSPITAL_COMMUNITY)
Admission: RE | Admit: 2020-06-14 | Discharge: 2020-06-14 | Disposition: A | Discharge status: Home / Self Care | Payer: BC Managed Care – PPO | Source: Ambulatory Visit | Attending: Internal Medicine | Admitting: Internal Medicine

## 2020-06-14 ENCOUNTER — Encounter (HOSPITAL_COMMUNITY)
Admission: RE | Admit: 2020-06-14 | Discharge: 2020-06-14 | Disposition: A | Discharge status: Home / Self Care | Payer: BC Managed Care – PPO | Source: Ambulatory Visit | Attending: Internal Medicine | Admitting: Internal Medicine

## 2020-06-14 DIAGNOSIS — Z20822 Contact with and (suspected) exposure to covid-19: Secondary | ICD-10-CM | POA: Insufficient documentation

## 2020-06-14 DIAGNOSIS — Z01812 Encounter for preprocedural laboratory examination: Secondary | ICD-10-CM | POA: Diagnosis not present

## 2020-06-14 LAB — BASIC METABOLIC PANEL
Anion gap: 7 (ref 5–15)
BUN: 10 mg/dL (ref 8–23)
CO2: 27 mmol/L (ref 22–32)
Calcium: 9.5 mg/dL (ref 8.9–10.3)
Chloride: 100 mmol/L (ref 98–111)
Creatinine, Ser: 0.65 mg/dL (ref 0.61–1.24)
GFR, Estimated: >60 mL/min (ref >60)
Glucose, Bld: 135 mg/dL — ABNORMAL HIGH (ref 70–99)
Potassium: 3.6 mmol/L (ref 3.5–5.1)
Sodium: 134 mmol/L — ABNORMAL LOW (ref 135–145)

## 2020-06-14 LAB — SARS CORONAVIRUS 2 (TAT 6-24 HRS): SARS Coronavirus 2: NEGATIVE

## 2020-06-14 NOTE — Telephone Encounter (Signed)
Pt was feeling better til yesterday. Started having bilateral shoulder pain, pt states pain moved up into head, shoulders are still sore this morning, cough, congestion, sneezing, runny nose, headache today. No fever. No sob. Goes for pre op today for colonscopy. I called [facility] because we did not get lymes test results. [facility] told me they missed that order and would reach out to the pt to have it drawn. I did call and let pt know what lab told me. He is still taking antibiotic. Has 9 pills. Pt wants to know if he should continue antibiotic. States he was told he wouldn't have to take but for one week. Antibiotic is already affecting his stomach.

## 2020-06-14 NOTE — Telephone Encounter (Signed)
I would recommend completing the full course of the doxycycline that was prescribed  May take with a snack to lessen the risk of upset stomach also with a tall glass of water  The head congestion sneezing runny nose headache is not due to Lyme's disease.  Lyme's disease does not trigger that type of issue.  The likelihood of this being Lyme's is very low.  Doing the follow-up blood work would be reasonable.  More than likely gastroenterology/colonoscopy preop we will have him do a COVID test before undergoing procedure  May use OTC allergy medicines as needed for congestion  Follow-up if any ongoing troubles

## 2020-06-15 LAB — SPECIMEN STATUS REPORT

## 2020-06-18 ENCOUNTER — Encounter (HOSPITAL_COMMUNITY): Payer: Self-pay

## 2020-06-18 ENCOUNTER — Ambulatory Visit (HOSPITAL_COMMUNITY): Payer: BC Managed Care – PPO | Admitting: Certified Registered"

## 2020-06-18 ENCOUNTER — Encounter (HOSPITAL_COMMUNITY)
Admission: RE | Disposition: A | Discharge status: Home / Self Care | Payer: Self-pay | Source: Home / Self Care | Attending: Internal Medicine

## 2020-06-18 ENCOUNTER — Ambulatory Visit (HOSPITAL_COMMUNITY)
Admission: RE | Admit: 2020-06-18 | Discharge: 2020-06-18 | Disposition: A | Discharge status: Home / Self Care | Payer: BC Managed Care – PPO | Attending: Internal Medicine | Admitting: Internal Medicine

## 2020-06-18 ENCOUNTER — Other Ambulatory Visit: Payer: Self-pay

## 2020-06-18 DIAGNOSIS — K635 Polyp of colon: Secondary | ICD-10-CM | POA: Diagnosis not present

## 2020-06-18 DIAGNOSIS — Z886 Allergy status to analgesic agent status: Secondary | ICD-10-CM | POA: Diagnosis not present

## 2020-06-18 DIAGNOSIS — Z09 Encounter for follow-up examination after completed treatment for conditions other than malignant neoplasm: Secondary | ICD-10-CM | POA: Diagnosis not present

## 2020-06-18 DIAGNOSIS — D124 Benign neoplasm of descending colon: Secondary | ICD-10-CM | POA: Diagnosis not present

## 2020-06-18 DIAGNOSIS — Z79899 Other long term (current) drug therapy: Secondary | ICD-10-CM | POA: Insufficient documentation

## 2020-06-18 DIAGNOSIS — Z86010 Personal history of colonic polyps: Secondary | ICD-10-CM

## 2020-06-18 DIAGNOSIS — K5730 Diverticulosis of large intestine without perforation or abscess without bleeding: Secondary | ICD-10-CM | POA: Insufficient documentation

## 2020-06-18 DIAGNOSIS — K648 Other hemorrhoids: Secondary | ICD-10-CM | POA: Diagnosis not present

## 2020-06-18 DIAGNOSIS — Z8505 Personal history of malignant neoplasm of liver: Secondary | ICD-10-CM | POA: Insufficient documentation

## 2020-06-18 DIAGNOSIS — I10 Essential (primary) hypertension: Secondary | ICD-10-CM | POA: Diagnosis not present

## 2020-06-18 DIAGNOSIS — Z888 Allergy status to other drugs, medicaments and biological substances status: Secondary | ICD-10-CM | POA: Insufficient documentation

## 2020-06-18 DIAGNOSIS — K6289 Other specified diseases of anus and rectum: Secondary | ICD-10-CM | POA: Diagnosis not present

## 2020-06-18 HISTORY — PX: COLONOSCOPY WITH PROPOFOL: SHX5780

## 2020-06-18 HISTORY — PX: POLYPECTOMY: SHX5525

## 2020-06-18 SURGERY — COLONOSCOPY WITH PROPOFOL
Anesthesia: General

## 2020-06-18 MED ORDER — PROPOFOL 10 MG/ML IV BOLUS
INTRAVENOUS | Status: DC | PRN
  Administered 2020-06-18: 50 mg via INTRAVENOUS
  Administered 2020-06-18: 30 mg via INTRAVENOUS
  Administered 2020-06-18: 100 mg via INTRAVENOUS
  Administered 2020-06-18 (×5): 50 mg via INTRAVENOUS

## 2020-06-18 MED ORDER — STERILE WATER FOR IRRIGATION IR SOLN
Status: DC | PRN
  Administered 2020-06-18: 100 mL

## 2020-06-18 MED ORDER — LACTATED RINGERS IV SOLN: INTRAVENOUS | Status: DC | PRN

## 2020-06-18 NOTE — Transfer of Care (Signed)
Immediate Anesthesia Transfer of Care Note  Patient: Logan Alvarez  Procedure(s) Performed: COLONOSCOPY WITH PROPOFOL (N/A ) POLYPECTOMY  Patient Location: Short Stay  Anesthesia Type:General  Level of Consciousness: awake, alert , oriented and patient cooperative  Airway & Oxygen Therapy: Patient Spontanous Breathing  Post-op Assessment: Report given to RN, Post -op Vital signs reviewed and stable and Patient moving all extremities  Post vital signs: Reviewed and stable  Last Vitals:  Vitals Value Taken Time  BP    Temp    Pulse    Resp    SpO2      Last Pain:  Vitals:   06/18/20 1108  TempSrc:   PainSc: 0-No pain      Patients Stated Pain Goal: 5 (35/[id number]/17 [id number])  Complications: No complications documented.

## 2020-06-18 NOTE — Discharge Instructions (Addendum)
Colon Polyps  Colon polyps are tissue growths inside the colon, which is part of the large intestine. They are one of the types of polyps that can grow in the body. A polyp may be a round bump or a mushroom-shaped growth. You could have one polyp or more than one. Most colon polyps are noncancerous (benign). However, some colon polyps can become cancerous over time. Finding and removing the polyps early can help prevent this. What are the causes? The exact cause of colon polyps is not known. What increases the risk? The following factors may make you more likely to develop this condition:  Having a family history of colorectal cancer or colon polyps.  Being older than 66 years of age.  Being younger than 66 years of age and having a significant family history of colorectal cancer or colon polyps or a genetic condition that puts you at higher risk of getting colon polyps.  Having inflammatory bowel disease, such as ulcerative colitis or Crohn's disease.  Having certain conditions passed from parent to child (hereditary conditions), such as: ? Familial adenomatous polyposis (FAP). ? Lynch syndrome. ? Turcot syndrome. ? Peutz-Jeghers syndrome. ? MUTYH-associated polyposis (MAP).  Being overweight.  Certain lifestyle factors. These include smoking cigarettes, drinking too much alcohol, not getting enough exercise, and eating a diet that is high in fat and red meat and low in fiber.  Having had childhood cancer that was treated with radiation of the abdomen. What are the signs or symptoms? Many times, there are no symptoms. If you have symptoms, they may include:  Blood coming from the rectum during a bowel movement.  Blood in the stool (feces). The blood may be bright red or very dark in color.  Pain in the abdomen.  A change in bowel habits, such as constipation or diarrhea. How is this diagnosed? This condition is diagnosed with a colonoscopy. This is a procedure in which a  lighted, flexible scope is inserted into the opening between the buttocks (anus) and then passed into the colon to examine the area. Polyps are sometimes found when a colonoscopy is done as part of routine cancer screening tests. How is this treated? This condition is treated by removing any polyps that are found. Most polyps can be removed during a colonoscopy. Those polyps will then be tested for cancer. Additional treatment may be needed depending on the results of testing. Follow these instructions at home: Eating and drinking  Eat foods that are high in fiber, such as fruits, vegetables, and whole grains.  Eat foods that are high in calcium and vitamin D, such as milk, cheese, yogurt, eggs, liver, fish, and broccoli.  Limit foods that are high in fat, such as fried foods and desserts.  Limit the amount of red meat, precooked or cured meat, or other processed meat that you eat, such as hot dogs, sausages, bacon, or meat loaves.  Limit sugary drinks.   Lifestyle  Maintain a healthy weight, or lose weight if recommended by your health care provider.  Exercise every day or as told by your health care provider.  Do not use any products that contain nicotine or tobacco, such as cigarettes, e-cigarettes, and chewing tobacco. If you need help quitting, ask your health care provider.  Do not drink alcohol if: ? Your health care provider tells you not to drink. ? You are pregnant, may be pregnant, or are planning to become pregnant.  If you drink alcohol: ? Limit how much you use to:  0-1 drink a day for women.  0-2 drinks a day for men. ? Know how much alcohol is in your drink. In the [place] one drink equals one 12 oz bottle of beer (355 mL), one 5 oz glass of wine (148 mL), or one 1 oz glass of hard liquor (44 mL). General instructions  Take over-the-counter and prescription medicines only as told by your health care provider.  Keep all follow-up visits. This is important. This  includes having regularly scheduled colonoscopies. Talk to your health care provider about when you need a colonoscopy. Contact a health care provider if:  You have new or worsening bleeding during a bowel movement.  You have new or increased blood in your stool.  You have a change in bowel habits.  You lose weight for no known reason. Summary  Colon polyps are tissue growths inside the colon, which is part of the large intestine. They are one type of polyp that can grow in the body.  Most colon polyps are noncancerous (benign), but some can become cancerous over time.  This condition is diagnosed with a colonoscopy.  This condition is treated by removing any polyps that are found. Most polyps can be removed during a colonoscopy. This information is not intended to replace advice given to you by your health care provider. Make sure you discuss any questions you have with your health care provider. Document Revised: 05/10/2019 Document Reviewed: 05/10/2019 Elsevier Patient Education  2021 [place]. Colonoscopy, Adult, Care After This sheet gives you information about how to care for yourself after your procedure. Your health care provider may also give you more specific instructions. If you have problems or questions, contact your health care provider. What can I expect after the procedure? After the procedure, it is common to have:  A small amount of blood in your stool for 24 hours after the procedure.  Some gas.  Mild cramping or bloating of your abdomen. Follow these instructions at home: Eating and drinking  Drink enough fluid to keep your urine pale yellow.  Follow instructions from your health care provider about eating or drinking restrictions.  Resume your normal diet as instructed by your health care provider. Avoid heavy or fried foods that are hard to digest.   Activity  Rest as told by your health care provider.  Avoid sitting for a long time without  moving. Get up to take short walks every 1-2 hours. This is important to improve blood flow and breathing. Ask for help if you feel weak or unsteady.  Return to your normal activities as told by your health care provider. Ask your health care provider what activities are safe for you. Managing cramping and bloating  Try walking around when you have cramps or feel bloated.  Apply heat to your abdomen as told by your health care provider. Use the heat source that your health care provider recommends, such as a moist heat pack or a heating pad. ? Place a towel between your skin and the heat source. ? Leave the heat on for 20-30 minutes. ? Remove the heat if your skin turns bright red. This is especially important if you are unable to feel pain, heat, or cold. You may have a greater risk of getting burned.   General instructions  If you were given a sedative during the procedure, it can affect you for several hours. Do not drive or operate machinery until your health care provider says that it is safe.  For the  first 24 hours after the procedure: ? Do not sign important documents. ? Do not drink alcohol. ? Do your regular daily activities at a slower pace than normal. ? Eat soft foods that are easy to digest.  Take over-the-counter and prescription medicines only as told by your health care provider.  Keep all follow-up visits as told by your health care provider. This is important. Contact a health care provider if:  You have blood in your stool 2-3 days after the procedure. Get help right away if you have:  More than a small spotting of blood in your stool.  Large blood clots in your stool.  Swelling of your abdomen.  Nausea or vomiting.  A fever.  Increasing pain in your abdomen that is not relieved with medicine. Summary  After the procedure, it is common to have a small amount of blood in your stool. You may also have mild cramping and bloating of your abdomen.  If you  were given a sedative during the procedure, it can affect you for several hours. Do not drive or operate machinery until your health care provider says that it is safe.  Get help right away if you have a lot of blood in your stool, nausea or vomiting, a fever, or increased pain in your abdomen. This information is not intended to replace advice given to you by your health care provider. Make sure you discuss any questions you have with your health care provider. Document Revised: 01/13/2019 Document Reviewed: 08/15/2018 Elsevier Patient Education  2021 [place]. Diverticulosis  Diverticulosis is a condition that develops when small pouches (diverticula) form in the wall of the large intestine (colon). The colon is where water is absorbed and stool (feces) is formed. The pouches form when the inside layer of the colon pushes through weak spots in the outer layers of the colon. You may have a few pouches or many of them. The pouches usually do not cause problems unless they become inflamed or infected. When this happens, the condition is called diverticulitis. What are the causes? The cause of this condition is not known. What increases the risk? The following factors may make you more likely to develop this condition:  Being older than age 82. Your risk for this condition increases with age. Diverticulosis is rare among people younger than age 13. By age 75, many people have it.  Eating a low-fiber diet.  Having frequent constipation.  Being overweight.  Not getting enough exercise.  Smoking.  Taking over-the-counter pain medicines, like aspirin and ibuprofen.  Having a family history of diverticulosis. What are the signs or symptoms? In most people, there are no symptoms of this condition. If you do have symptoms, they may include:  Bloating.  Cramps in the abdomen.  Constipation or diarrhea.  Pain in the lower left side of the abdomen. How is this diagnosed? Because  diverticulosis usually has no symptoms, it is most often diagnosed during an exam for other colon problems. The condition may be diagnosed by:  Using a flexible scope to examine the colon (colonoscopy).  Taking an X-ray of the colon after dye has been put into the colon (barium enema).  Having a CT scan. How is this treated? You may not need treatment for this condition. Your health care provider may recommend treatment to prevent problems. You may need treatment if you have symptoms or if you previously had diverticulitis. Treatment may include:  Eating a high-fiber diet.  Taking a fiber supplement.  Taking  a live bacteria supplement (probiotic).  Taking medicine to relax your colon.   Follow these instructions at home: Medicines  Take over-the-counter and prescription medicines only as told by your health care provider.  If told by your health care provider, take a fiber supplement or probiotic. Constipation prevention Your condition may cause constipation. To prevent or treat constipation, you may need to:  Drink enough fluid to keep your urine pale yellow.  Take over-the-counter or prescription medicines.  Eat foods that are high in fiber, such as beans, whole grains, and fresh fruits and vegetables.  Limit foods that are high in fat and processed sugars, such as fried or sweet foods.   General instructions  Try not to strain when you have a bowel movement.  Keep all follow-up visits as told by your health care provider. This is important. Contact a health care provider if you:  Have pain in your abdomen.  Have bloating.  Have cramps.  Have not had a bowel movement in 3 days. Get help right away if:  Your pain gets worse.  Your bloating becomes very bad.  You have a fever or chills, and your symptoms suddenly get worse.  You vomit.  You have bowel movements that are bloody or black.  You have bleeding from your rectum. Summary  Diverticulosis is a  condition that develops when small pouches (diverticula) form in the wall of the large intestine (colon).  You may have a few pouches or many of them.  This condition is most often diagnosed during an exam for other colon problems.  Treatment may include increasing the fiber in your diet, taking supplements, or taking medicines. This information is not intended to replace advice given to you by your health care provider. Make sure you discuss any questions you have with your health care provider. Document Revised: 08/18/2018 Document Reviewed: 08/18/2018 Elsevier Patient Education  2021 [place].  Colonoscopy Discharge Instructions  Read the instructions outlined below and refer to this sheet in the next few weeks. These discharge instructions provide you with general information on caring for yourself after you leave the hospital. Your doctor may also give you specific instructions. While your treatment has been planned according to the most current medical practices available, unavoidable complications occasionally occur.   ACTIVITY  You may resume your regular activity, but move at a slower pace for the next 24 hours.   Take frequent rest periods for the next 24 hours.   Walking will help get rid of the air and reduce the bloated feeling in your belly (abdomen).   No driving for 24 hours (because of the medicine (anesthesia) used during the test).    Do not sign any important legal documents or operate any machinery for 24 hours (because of the anesthesia used during the test).  NUTRITION  Drink plenty of fluids.   You may resume your normal diet as instructed by your doctor.   Begin with a light meal and progress to your normal diet. Heavy or fried foods are harder to digest and may make you feel sick to your stomach (nauseated).   Avoid alcoholic beverages for 24 hours or as instructed.  MEDICATIONS  You may resume your normal medications unless your doctor tells you  otherwise.  WHAT YOU CAN EXPECT TODAY  Some feelings of bloating in the abdomen.   Passage of more gas than usual.   Spotting of blood in your stool or on the toilet paper.  IF YOU HAD POLYPS  REMOVED DURING THE COLONOSCOPY:  No aspirin products for 7 days or as instructed.   No alcohol for 7 days or as instructed.   Eat a soft diet for the next 24 hours.  FINDING OUT THE RESULTS OF YOUR TEST Not all test results are available during your visit. If your test results are not back during the visit, make an appointment with your caregiver to find out the results. Do not assume everything is normal if you have not heard from your caregiver or the medical facility. It is important for you to follow up on all of your test results.  SEEK IMMEDIATE MEDICAL ATTENTION IF:  You have more than a spotting of blood in your stool.   Your belly is swollen (abdominal distention).   You are nauseated or vomiting.   You have a temperature over 101.   You have abdominal pain or discomfort that is severe or gets worse throughout the day.   Your colonoscopy revealed 3 polyp(s) which I removed successfully. Await pathology results, my office will contact you. I recommend repeating colonoscopy in 5 years for surveillance purposes. You also have diverticulosis and internal hemorrhoids. I would recommend increasing fiber in your diet or adding OTC Benefiber/Metamucil. Be sure to drink at least 4 to 6 glasses of water daily. Follow-up with GI as needed.    I hope you have a great rest of your week!  Elon Alas. Abbey Chatters, D.O. Gastroenterology and Hepatology [facility]

## 2020-06-18 NOTE — Anesthesia Preprocedure Evaluation (Signed)
Anesthesia Evaluation  Patient identified by MRN, date of birth, ID band Patient awake    Reviewed: Allergy & Precautions, H&P , NPO status , Patient's Chart, lab work & pertinent test results, reviewed documented beta blocker date and time   Airway Mallampati: II  TM Distance: >3 FB Neck ROM: full    Dental no notable dental hx.    Pulmonary neg pulmonary ROS,    Pulmonary exam normal breath sounds clear to auscultation       Cardiovascular Exercise Tolerance: Good hypertension, negative cardio ROS   Rhythm:regular Rate:Normal     Neuro/Psych Anxiety negative neurological ROS  negative psych ROS   GI/Hepatic negative GI ROS, Neg liver ROS,   Endo/Other  negative endocrine ROS  Renal/GU negative Renal ROS  negative genitourinary   Musculoskeletal   Abdominal   Peds  Hematology negative hematology ROS (+)   Anesthesia Other Findings   Reproductive/Obstetrics negative OB ROS                             Anesthesia Physical Anesthesia Plan  ASA: II  Anesthesia Plan: General   Post-op Pain Management:    Induction:   PONV Risk Score and Plan: Propofol infusion  Airway Management Planned:   Additional Equipment:   Intra-op Plan:   Post-operative Plan:   Informed Consent: I have reviewed the patients History and Physical, chart, labs and discussed the procedure including the risks, benefits and alternatives for the proposed anesthesia with the patient or authorized representative who has indicated his/her understanding and acceptance.     Dental Advisory Given  Plan Discussed with: CRNA  Anesthesia Plan Comments:         Anesthesia Quick Evaluation

## 2020-06-18 NOTE — H&P (Signed)
Primary Care Physician:  Kathyrn Drown, MD Primary Gastroenterologist:  Dr. Abbey Chatters  Pre-Procedure History & Physical: HPI:  Logan Alvarez is a 65 y.o. male is here for a colonoscopy for surveillance due to personal history of adenomatous colon polyps. Last colonoscopy 2019 with 16 polyps removed. Path showed all were tubular adenomas.   Past Medical History:  Diagnosis Date  . Cancer ([place]) 2020   liver tumor removed  . Chest pain    a. 2008: Normal ETT;  b. 01/2013 CTA: Ca score of 715 (92%'ile), LAD 50p, D1 50-75, RCA 50p.  Marland Kitchen Hyperlipidemia   . Hypertension   . Testosterone deficiency 2009    Past Surgical History:  Procedure Laterality Date  . COLONOSCOPY N/A 03/12/2017   Procedure: COLONOSCOPY;  Surgeon: Danie Binder, MD;  Location: [facility];  Service: Endoscopy;  Laterality: N/A;  2:00  . LEFT HEART CATHETERIZATION WITH CORONARY ANGIOGRAM N/A 02/01/2013   Procedure: LEFT HEART CATHETERIZATION WITH CORONARY ANGIOGRAM;  Surgeon: Josue Hector, MD;  Location: [facility] CATH LAB;  Service: Cardiovascular;  Laterality: N/A;  . NECK SURGERY      Prior to Admission medications   Medication Sig Start Date End Date Taking? Authorizing Provider  ALPRAZolam Duanne Moron) 0.5 MG tablet 1/2 tablet up to 4 times a day as needed for anxiety 06/04/20  Yes Luking, Elayne Snare, MD  doxycycline (VIBRA-TABS) 100 MG tablet Take 1 tablet (100 mg total) by mouth 2 (two) times daily. 06/04/20  Yes Kathyrn Drown, MD  ibuprofen (ADVIL,MOTRIN) 200 MG tablet Take 400 mg by mouth every 8 (eight) hours as needed (for pain.).   Yes [provider]  Multiple Vitamin (MULTIVITAMIN) tablet Take 1 tablet by mouth daily.   Yes [provider]  pravastatin (PRAVACHOL) 20 MG tablet Take 1 tablet (20 mg total) by mouth daily. 03/18/20  Yes Luking, Elayne Snare, MD  albuterol (PROVENTIL HFA;VENTOLIN HFA) 108 (90 Base) MCG/ACT inhaler Inhale 2 puffs into the lungs every 4 (four) hours as needed for wheezing. 04/06/17    Kathyrn Drown, MD  polyethylene glycol-electrolytes (NULYTELY) 420 g solution As directed 05/07/20   Eloise Harman, DO    Allergies as of 05/07/2020 - Review Complete 04/25/2020  Allergen Reaction Noted  . Cymbalta [duloxetine hcl]  08/28/2016  . Lodine [etodolac] Nausea And Vomiting 05/20/2012  . Zoloft [sertraline hcl]  01/29/2017    Family History  Problem Relation Age of Onset  . Heart attack Father        75's    Social History   Socioeconomic History  . Marital status: Widowed    Spouse name: Not on file  . Number of children: Not on file  . Years of education: Not on file  . Highest education level: Not on file  Occupational History  . Not on file  Tobacco Use  . Smoking status: Never Smoker  . Smokeless tobacco: Never Used  Vaping Use  . Vaping Use: Never used  Substance and Sexual Activity  . Alcohol use: Yes    Comment: 4-6 beers a day  . Drug use: No  . Sexual activity: Not on file  Other Topics Concern  . Not on file  Social History Narrative  . Not on file   Social Determinants of Health   Financial Resource Strain: Not on file  Food Insecurity: Not on file  Transportation Needs: Not on file  Physical Activity: Not on file  Stress: Not on file  Social Connections:  Not on file  Intimate Partner Violence: Not on file    Review of Systems: See HPI, otherwise negative ROS  Physical Exam: Vital signs in last 24 hours: Temp:  [98.4 F (36.9 C)] 98.4 F (36.9 C) (05/17 1030) Pulse Rate:  [76] 76 (05/17 1030) Resp:  [12] 12 (05/17 1030) BP: (152)/(84) 152/84 (05/17 1030) SpO2:  [100 %] 100 % (05/17 1030) Weight:  [96.2 kg] 96.2 kg (05/17 1030)   General:   Alert,  Well-developed, well-nourished, pleasant and cooperative in NAD Head:  Normocephalic and atraumatic. Eyes:  Sclera clear, no icterus.   Conjunctiva pink. Ears:  Normal auditory acuity. Nose:  No deformity, discharge,  or lesions. Mouth:  No deformity or lesions, dentition  normal. Neck:  Supple; no masses or thyromegaly. Lungs:  Clear throughout to auscultation.   No wheezes, crackles, or rhonchi. No acute distress. Heart:  Regular rate and rhythm; no murmurs, clicks, rubs,  or gallops. Abdomen:  Soft, nontender and nondistended. No masses, hepatosplenomegaly or hernias noted. Normal bowel sounds, without guarding, and without rebound.   Msk:  Symmetrical without gross deformities. Normal posture. Extremities:  Without clubbing or edema. Neurologic:  Alert and  oriented x4;  grossly normal neurologically. Skin:  Intact without significant lesions or rashes. Cervical Nodes:  No significant cervical adenopathy. Psych:  Alert and cooperative. Normal mood and affect.  Impression/Plan: Logan Alvarez is here for a colonoscopy for surveillance due to personal history of adenomatous colon polyps. Last colonoscopy 2019 with 16 polyps removed. Path showed all were tubular adenomas.   The risks of the procedure including infection, bleed, or perforation as well as benefits, limitations, alternatives and imponderables have been reviewed with the patient. Questions have been answered. All parties agreeable.

## 2020-06-18 NOTE — Op Note (Signed)
[facility] Patient Name: Logan Alvarez Procedure Date: 06/18/2020 10:57 AM MRN: [id number] Date of Birth: 10/07/1955 Attending MD: Elon Alas. Abbey Chatters DO CSN: [id number] Age: 65 Admit Type: Outpatient Procedure:                Colonoscopy Indications:              Surveillance: Personal history of adenomatous                            polyps on last colonoscopy 3 years ago Providers:                Elon Alas. Abbey Chatters, DO, Caprice Kluver, Tammy Vaught,                            RN, Thomas Hoff., Technician Referring MD:              Medicines:                See the Anesthesia note for documentation of the                            administered medications Complications:            No immediate complications. Estimated Blood Loss:     Estimated blood loss was minimal. Procedure:                Pre-Anesthesia Assessment:                           - The anesthesia plan was to use monitored                            anesthesia care (MAC).                           After obtaining informed consent, the colonoscope                            was passed under direct vision. Throughout the                            procedure, the patient's blood pressure, pulse, and                            oxygen saturations were monitored continuously. The                            PCF-HQ190L ([id number]) scope was introduced through                            the anus and advanced to the the cecum, identified                            by appendiceal orifice and ileocecal valve. The                            colonoscopy was  performed without difficulty. The                            patient tolerated the procedure well. The quality                            of the bowel preparation was evaluated using the                            BBPS [facility] Bowel Preparation Scale) with scores                            of: Right Colon = 3, Transverse Colon = 3 and Left                            Colon =  3 (entire mucosa seen well with no residual                            staining, small fragments of stool or opaque                            liquid). The total BBPS score equals 9. Scope In: 11:13:32 AM Scope Out: 11:31:42 AM Scope Withdrawal Time: 0 hours 11 minutes 36 seconds  Total Procedure Duration: 0 hours 18 minutes 10 seconds  Findings:      The perianal and digital rectal examinations were normal.      Non-bleeding internal hemorrhoids were found during endoscopy.      A 6 mm polyp was found in the descending colon. The polyp was sessile.       The polyp was removed with a cold snare. Resection and retrieval were       complete.      Two sessile polyps were found in the descending colon. The polyps were 2       mm in size. These polyps were removed with a cold biopsy forceps.       Resection and retrieval were complete.      A few small-mouthed diverticula were found in the transverse colon. Impression:               - Non-bleeding internal hemorrhoids.                           - One 6 mm polyp in the descending colon, removed                            with a cold snare. Resected and retrieved.                           - Two 2 mm polyps in the descending colon, removed                            with a cold biopsy forceps. Resected and retrieved.                           - Diverticulosis in  the transverse colon. Moderate Sedation:      Per Anesthesia Care Recommendation:           - Patient has a contact number available for                            emergencies. The signs and symptoms of potential                            delayed complications were discussed with the                            patient. Return to normal activities tomorrow.                            Written discharge instructions were provided to the                            patient.                           - Resume previous diet.                           - Continue present medications.                            - Await pathology results.                           - Repeat colonoscopy in 5 years for surveillance.                           - Return to GI clinic PRN. Procedure Code(s):        --- Professional ---                           [phone], Colonoscopy, flexible; with removal of                            tumor(s), polyp(s), or other lesion(s) by snare                            technique                           45380, 49, Colonoscopy, flexible; with biopsy,                            single or multiple Diagnosis Code(s):        --- Professional ---                           K63.5, Polyp of colon                           Z86.010, Personal history of colonic polyps  K64.8, Other hemorrhoids CPT copyright 2019 American Medical Association. All rights reserved. The codes documented in this report are preliminary and upon coder review may  be revised to meet current compliance requirements. Elon Alas. Abbey Chatters, DO [place] Abbey Chatters, DO 06/18/2020 11:35:48 AM This report has been signed electronically. Number of Addenda: 0

## 2020-06-18 NOTE — Anesthesia Postprocedure Evaluation (Signed)
Anesthesia Post Note  Patient: Logan Alvarez  Procedure(s) Performed: COLONOSCOPY WITH PROPOFOL (N/A ) POLYPECTOMY  Patient location during evaluation: Phase II Anesthesia Type: General Level of consciousness: awake Pain management: pain level controlled Vital Signs Assessment: post-procedure vital signs reviewed and stable Respiratory status: spontaneous breathing and respiratory function stable Cardiovascular status: blood pressure returned to baseline and stable Postop Assessment: no headache and no apparent nausea or vomiting Anesthetic complications: no Comments: Late entry   No complications documented.   Last Vitals:  Vitals:   06/18/20 1030 06/18/20 1136  BP: (!) 152/84 118/68  Pulse: 76   Resp: 12   Temp: 36.9 C   SpO2: 100%     Last Pain:  Vitals:   06/18/20 1108  TempSrc:   PainSc: 0-No pain                 Louann Sjogren

## 2020-06-18 NOTE — Anesthesia Procedure Notes (Signed)
Date/Time: 06/18/2020 11:14 AM Performed by: Orlie Dakin, CRNA Pre-anesthesia Checklist: Patient identified, Emergency Drugs available, Suction available and Patient being monitored Patient Re-evaluated:Patient Re-evaluated prior to induction Oxygen Delivery Method: Nasal cannula Induction Type: IV induction Placement Confirmation: positive ETCO2

## 2020-06-19 LAB — SURGICAL PATHOLOGY

## 2020-06-24 ENCOUNTER — Encounter (HOSPITAL_COMMUNITY): Payer: Self-pay | Admitting: Internal Medicine

## 2020-06-25 DIAGNOSIS — C220 Liver cell carcinoma: Secondary | ICD-10-CM | POA: Diagnosis not present

## 2020-06-25 DIAGNOSIS — C800 Disseminated malignant neoplasm, unspecified: Secondary | ICD-10-CM | POA: Diagnosis not present

## 2020-06-25 DIAGNOSIS — C7989 Secondary malignant neoplasm of other specified sites: Secondary | ICD-10-CM | POA: Diagnosis not present

## 2020-06-25 DIAGNOSIS — Z79899 Other long term (current) drug therapy: Secondary | ICD-10-CM | POA: Diagnosis not present

## 2020-06-25 DIAGNOSIS — K862 Cyst of pancreas: Secondary | ICD-10-CM | POA: Diagnosis not present

## 2020-06-25 DIAGNOSIS — K8689 Other specified diseases of pancreas: Secondary | ICD-10-CM | POA: Diagnosis not present

## 2020-06-25 DIAGNOSIS — R222 Localized swelling, mass and lump, trunk: Secondary | ICD-10-CM | POA: Diagnosis not present

## 2020-07-12 NOTE — Progress Notes (Signed)
Recall placed

## 2020-07-16 DIAGNOSIS — C220 Liver cell carcinoma: Secondary | ICD-10-CM | POA: Diagnosis not present

## 2020-07-25 ENCOUNTER — Ambulatory Visit
Admission: RE | Admit: 2020-07-25 | Discharge: 2020-07-25 | Disposition: A | Discharge status: Home / Self Care | Payer: Self-pay | Source: Ambulatory Visit | Attending: Hematology and Oncology | Admitting: Hematology and Oncology

## 2020-07-25 ENCOUNTER — Other Ambulatory Visit: Payer: Self-pay

## 2020-07-25 ENCOUNTER — Telehealth: Payer: Self-pay | Admitting: Hematology and Oncology

## 2020-07-25 DIAGNOSIS — C220 Liver cell carcinoma: Secondary | ICD-10-CM

## 2020-07-25 NOTE — Progress Notes (Signed)
MRI abdomen done on 06/05/2020 was actually done at [facility].  I called [facility] and they will push those images over to [facility].

## 2020-07-25 NOTE — Progress Notes (Signed)
Sent fax to [place] fax [phone] requesting MRI abdomen done on 06/05/2020 and CT dual pancreas protocol done on 06/25/2020 pushed to [place].  I requested to be notified when completed.

## 2020-07-25 NOTE — Telephone Encounter (Signed)
Received a new pt referral from Dr. Leamon Arnt for hepatocellular carcinoma. Logan Alvarez has been cld and scheduled to see Dr. Lorenso Courier on 6/27 at 9am. Pt awaret to arrive 20 minutes early.

## 2020-07-26 ENCOUNTER — Other Ambulatory Visit: Payer: Self-pay | Admitting: Family Medicine

## 2020-07-26 ENCOUNTER — Encounter: Payer: Self-pay | Admitting: Family Medicine

## 2020-07-26 MED ORDER — ALPRAZOLAM 0.5 MG PO TABS: ORAL_TABLET | ORAL | 1 refills | Status: DC

## 2020-07-28 NOTE — Progress Notes (Signed)
[facility] [phone]   Fax:([phone]) [place] NOTE  Patient Care Team: Kathyrn Drown, MD as PCP - General (Family Medicine) Eloise Harman, DO as Consulting Physician (Internal Medicine) Orson Slick, MD as Consulting Physician (Hematology and Oncology)  Hematological/Oncological History # Hepatocellular Carcinoma, Stage IV 10/19/17: abdomen u/s 1.7 x 1.4 x 2.0 cm solid-appearing hepatic nodule with echogenic rim consistent suggesting a calcified rim 11/08/17: liver needle core biopsy. Hepatocellular carcinoma, poorly differentiated in a background of necrosis. Reviewed at [place] Arginase, HepPar1, [place] 3 12-08-2017: robotic right partial hepatectomy. Path: T1b HCC 06/25/2020: CT abdomen shows nodular studding of the ventral mesenteric fat, findings consistent with peritoneal involvement/carcinomatosis.  06/25/2020: US guided biopsy consistent with poorly differentiated Hepatocelluar carcinoma. CA 19-9 5238, CEA 43.3 07/16/2020: clinic visit with Dr. Leamon Arnt. Referred to [facility] for care closer to home 07/29/2020: establish care with Dr. Lorenso Courier  CHIEF COMPLAINTS/PURPOSE OF CONSULTATION:  "Hepatocellular Carcinoma "  HISTORY OF PRESENTING ILLNESS:  Logan Alvarez 65 y.o. male with medical history significant for HLD, HTN, and HCC s/p resection 2020 who presents with metastatic recurrence of [place].   On review of the previous records Logan Alvarez was initially found to have a 1.7 x 1.4 x 2.0 cm solid-appearing hepatic nodule on his liver from ultrasound on 10/19/2017.  He had an MRI performed on 10/26/2017 which showed a 3.8 cm indeterminate mass in the inferior right hepatic lobe.  A biopsy was performed on 11/08/2017 which confirmed poorly differentiated hepatocellular carcinoma.  On 11/18/2017 the patient underwent a CT chest with contrast which showed 2 small lung nodules and were both stable from prior and thought to be benign.   He underwent a right partial hepatectomy on 12/08/2017.  Pathology from that time confirmed hepatocellular carcinoma which was poorly differentiated.  The margins were negative for carcinoma.  He entered surveillance at that time.  Recently on 06/25/2020 the patient a CT scan which showed nodular studding and thickening of the ventral fat mesentery suspicious for peritoneal seeding and carcinomatosis.  Enlarging nodule in anterior abdominal wall soft tissue roughly superficial to the hepatic resection.  A biopsy of this lesion was performed and consistent with metastatic hepatocarcinoma.  The patient was seen by Dr. Leamon Arnt on 07/16/2020.  Due to his desire to be treated closer to home the patient was referred to the [facility] for further evaluation and management.  On exam today Logan Alvarez is accompanied by his daughter.  He reports that he was initially diagnosed when he fell on his right side and developed a soreness that failed to resolve.  He had ultrasound performed which initially found his hepatic lesion.  He has been well since its resection back in 2019.  He reports that lately he has been having some abdominal discomfort when he tries to lay down at night but otherwise has not noticed any new symptoms.  He denies any bleeding, bruising, or dark stools.  On further discussion his family history is noncontributory.  He does drink alcohol and reports drinking about a 12 pack/week.  He just had a beer yesterday.  He reports that he is a never smoker.  He currently works as an Event organiser.  At the moment he does endorse having some weight loss with a decrease of 20 pounds since January 2022.  He recently had a colonoscopy but has not yet had an EGD.  He otherwise denies any fevers, chills, sweats,  nausea, vomiting or diarrhea.  A full 10 point ROS is listed below.  MEDICAL HISTORY:  Past Medical History:  Diagnosis Date   Cancer ([place]) 2020   liver tumor removed   Chest pain     a. 2008: Normal ETT;  b. 01/2013 CTA: Ca score of 715 (92%'ile), LAD 50p, D1 50-75, RCA 50p.   Hyperlipidemia    Hypertension    Testosterone deficiency 2009    SURGICAL HISTORY: Past Surgical History:  Procedure Laterality Date   COLONOSCOPY N/A 03/12/2017   Procedure: COLONOSCOPY;  Surgeon: Danie Binder, MD;  [facility]: [facility];  Service: Endoscopy;  Laterality: N/A;  2:00   COLONOSCOPY WITH PROPOFOL N/A 06/18/2020   Procedure: COLONOSCOPY WITH PROPOFOL;  Surgeon: Eloise Harman, DO;  Location: [facility];  Service: Endoscopy;  Laterality: N/A;  pm appt   LEFT HEART CATHETERIZATION WITH CORONARY ANGIOGRAM N/A 02/01/2013   Procedure: LEFT HEART CATHETERIZATION WITH CORONARY ANGIOGRAM;  Surgeon: Josue Hector, MD;  Location: [facility] CATH LAB;  Service: Cardiovascular;  Laterality: N/A;   NECK SURGERY     POLYPECTOMY  06/18/2020   Procedure: POLYPECTOMY;  Surgeon: Eloise Harman, DO;  Location: [facility];  Service: Endoscopy;;  snare and biospy    SOCIAL HISTORY: Social History   Socioeconomic History   Marital status: Widowed    Spouse name: Not on file   Number of children: Not on file   Years of education: Not on file   Highest education level: Not on file  Occupational History   Not on file  Tobacco Use   Smoking status: Never   Smokeless tobacco: Never  Vaping Use   Vaping Use: Never used  Substance and Sexual Activity   Alcohol use: Yes    Comment: 4-6 beers a day   Drug use: No   Sexual activity: Not on file  Other Topics Concern   Not on file  Social History Narrative   Not on file   Social Determinants of Health   Financial Resource Strain: Not on file  Food Insecurity: Not on file  Transportation Needs: Not on file  Physical Activity: Not on file  Stress: Not on file  Social Connections: Not on file  Intimate Partner Violence: Not on file    FAMILY HISTORY: Family History  Problem Relation Age of Onset   Heart attack Father         87's    ALLERGIES:  is allergic to cymbalta [duloxetine hcl], lodine [etodolac], and zoloft [sertraline hcl].  MEDICATIONS:  Current Outpatient Medications  Medication Sig Dispense Refill   ALPRAZolam (XANAX) 0.5 MG tablet 1/2 tablet to 1 tablet up to 3 times a day as needed for anxiety 90 tablet 1   dextromethorphan-guaiFENesin (ROBITUSSIN-DM) 10-100 MG/5ML liquid Take by mouth.     fluticasone (FLONASE) 50 MCG/ACT nasal spray Place into the nose.     ibuprofen (ADVIL,MOTRIN) 200 MG tablet Take 400 mg by mouth every 8 (eight) hours as needed (for pain.).     Multiple Vitamin (MULTIVITAMIN) tablet Take 1 tablet by mouth daily.     pravastatin (PRAVACHOL) 20 MG tablet Take 1 tablet (20 mg total) by mouth daily. 90 tablet 1   sodium chloride (OCEAN) 0.65 % nasal spray 1 spray as needed for Congestion     No current facility-administered medications for this visit.    REVIEW OF SYSTEMS:   Constitutional: ( - ) fevers, ( - )  chills , ( - )  night sweats Eyes: ( - ) blurriness of vision, ( - ) double vision, ( - ) watery eyes Ears, nose, mouth, throat, and face: ( - ) mucositis, ( - ) sore throat Respiratory: ( - ) cough, ( - ) dyspnea, ( - ) wheezes Cardiovascular: ( - ) palpitation, ( - ) chest discomfort, ( - ) lower extremity swelling Gastrointestinal:  ( - ) nausea, ( - ) heartburn, ( - ) change in bowel habits Skin: ( - ) abnormal skin rashes Lymphatics: ( - ) new lymphadenopathy, ( - ) easy bruising Neurological: ( - ) numbness, ( - ) tingling, ( - ) new weaknesses Behavioral/Psych: ( - ) mood change, ( - ) new changes  All other systems were reviewed with the patient and are negative.  PHYSICAL EXAMINATION: ECOG PERFORMANCE STATUS: 1 - Symptomatic but completely ambulatory  Vitals:   07/29/20 0924  BP: (!) 148/87  Pulse: 77  Resp: 17  Temp: 98 F (36.7 C)  SpO2: 100%   Filed Weights   07/29/20 0924  Weight: 217 lb 1.6 oz (98.5 kg)    GENERAL: well appearing elderly  Caucasian male in NAD  SKIN: skin color, texture, turgor are normal, no rashes or significant lesions EYES: conjunctiva are pink and non-injected, sclera clear LUNGS: clear to auscultation and percussion with normal breathing effort HEART: regular rate & rhythm and no murmurs and no lower extremity edema ABDOMEN: soft, non-tender, non-distended, normal bowel sounds Musculoskeletal: no cyanosis of digits and no clubbing  PSYCH: alert & oriented x 3, fluent speech NEURO: no focal motor/sensory deficits  LABORATORY DATA:  I have reviewed the data as listed CBC Latest Ref Rng & Units 07/29/2020 06/04/2020 11/08/2017  WBC 4.0 - 10.5 K/uL 8.2 10.1 7.2  Hemoglobin 13.0 - 17.0 g/dL 14.3 14.7 15.5  Hematocrit 39.0 - 52.0 % 41.7 44.2 47.1  Platelets 150 - 400 K/uL 356 410 244    CMP Latest Ref Rng & Units 06/14/2020 06/04/2020 05/16/2020  Glucose 70 - 99 mg/dL 135(H) 108(H) 112(H)  BUN 8 - 23 mg/dL 10 6(L) 7(L)  Creatinine 0.61 - 1.24 mg/dL 0.65 0.74(L) 0.72(L)  Sodium 135 - 145 mmol/L 134(L) 140 130(L)  Potassium 3.5 - 5.1 mmol/L 3.6 5.5(H) 5.0  Chloride 98 - 111 mmol/L 100 100 95(L)  CO2 22 - 32 mmol/L 27 23 20   Calcium 8.9 - 10.3 mg/dL 9.5 10.2 9.9  Total Protein 6.0 - 8.5 g/dL - 7.7 7.4  Total Bilirubin 0.0 - 1.2 mg/dL - 1.1 1.2  Alkaline Phos 44 - 121 IU/L - 138(H) 154(H)  AST 0 - 40 IU/L - 14 15  ALT 0 - 44 IU/L - 16 15     PATHOLOGY: 06/25/2020 Metastatic hepatocellular carcinoma.   Comment: Smears demonstrate a highly cellular sample, demonstrating monomorphic cells with smooth nuclear membranes and abundant granular cytoplasm with numerous transgressing vessels. Single cells are present around the larger groups. Diagnostic material is also present in the cell block.   The following immunohistochemistry was performed on the cell block A1 after review of the clinical history and morphology to further characterize the pathologic process. The results are as follows:   A1-17  HEP PAR1     Positive          A1-18  GP III             Weak focal positive A1-19  Arginase        Positive   Direct comparison to the patient's previous  case ([id number]) shows tumor cells with similar morphology.   The morphology and immunohistochemical stains support metastasis from the patient's known primary hepatocellular carcinoma.    RADIOGRAPHIC STUDIES: I have personally reviewed the radiological images as listed and agreed with the findings in the report. No results found.  ASSESSMENT & PLAN JOSEANDRES MAZER 65 y.o. male with medical history significant for HLD, HTN, and [place] s/p resection 2020 who presents with metastatic recurrence of [place].   After review of the labs, review of the records, and discussion with the patient the patients findings are most consistent with recurrent metastatic hepatocellular carcinoma today I discussed the treatment options moving forward this patient.  It appears though these treatment options were already previously discussed with Dr. Leamon Arnt at [facility].  The options as I see it would be bevacizumab and atezolizumab, sorafenib, or lenvatinib.  After the risks and benefits of each were discussed the patient noted he would like to proceed with bevacizumab and atezolizumab.  We would like to have an EGD performed prior to starting this therapy.  A port is optional and not required in the setting.  The patient does not wish to have a port placed at this time.  We will plan to see the patient back in approximately 2 weeks time hopefully after an EGD in order to have this performed.  # Hepatocellular Carcinoma, Stage IV --At this time I favor starting the patient on bevacizumab and atezolizumab q. 21 days --Prior to the start of this regiment we will need an EGD in order to assure the patient does not have any varices --Patient has recent imaging from 06/25/2020.  We will plan to have imaging repeated q. 3 months --Return to clinic in approximately 2 weeks  time in order to start treatment.  #Supportive Care -- chemotherapy education to be scheduled  -- port placement offered, declined at this time -- No antiemetics required as this is not an emetogenic treatment -- no pain medication required at this time.    Orders Placed This Encounter  Procedures   CBC with Differential ([place] Only)    Standing Status:   Future    Number of Occurrences:   1    Standing Expiration Date:   07/29/2021   CMP ([place] only)    Standing Status:   Future    Number of Occurrences:   1    Standing Expiration Date:   07/29/2021   Lactate dehydrogenase (LDH)    Standing Status:   Future    Number of Occurrences:   1    Standing Expiration Date:   07/29/2021   AFP tumor marker    Standing Status:   Future    Number of Occurrences:   1    Standing Expiration Date:   07/29/2021   CA 125    Standing Status:   Future    Number of Occurrences:   1    Standing Expiration Date:   07/29/2021   CA 19.9    Standing Status:   Future    Number of Occurrences:   1    Standing Expiration Date:   07/29/2021   CEA (IN HOUSE-[facility])FOR [facility] ONLY    Standing Status:   Future    Number of Occurrences:   1    Standing Expiration Date:   07/29/2021    All questions were answered. The patient knows to call the clinic with any problems, questions or concerns.  A total of  more than 60 minutes were spent on this encounter with face-to-face time and non-face-to-face time, including preparing to see the patient, ordering tests and/or medications, counseling the patient and coordination of care as outlined above.   Ledell Peoples, MD Department of Hematology/Oncology [place] [facility] Phone: [phone] Pager: [phone] Email: [email or web address]  07/29/2020 10:47 AM

## 2020-07-29 ENCOUNTER — Telehealth: Payer: Self-pay | Admitting: Internal Medicine

## 2020-07-29 ENCOUNTER — Inpatient Hospital Stay: Payer: BC Managed Care – PPO

## 2020-07-29 ENCOUNTER — Encounter (HOSPITAL_COMMUNITY)
Admission: RE | Admit: 2020-07-29 | Discharge: 2020-07-29 | Disposition: A | Discharge status: Home / Self Care | Payer: BC Managed Care – PPO | Source: Ambulatory Visit | Attending: Internal Medicine | Admitting: Internal Medicine

## 2020-07-29 ENCOUNTER — Inpatient Hospital Stay: Payer: BC Managed Care – PPO | Attending: Hematology and Oncology | Admitting: Hematology and Oncology

## 2020-07-29 ENCOUNTER — Encounter (HOSPITAL_COMMUNITY): Payer: Self-pay

## 2020-07-29 ENCOUNTER — Encounter: Payer: Self-pay | Admitting: Hematology and Oncology

## 2020-07-29 ENCOUNTER — Other Ambulatory Visit: Payer: Self-pay

## 2020-07-29 VITALS — BP 148/87 | HR 77 | Temp 98.0°F | Resp 17 | Ht 68.0 in | Wt 217.1 lb

## 2020-07-29 DIAGNOSIS — Z79899 Other long term (current) drug therapy: Secondary | ICD-10-CM | POA: Diagnosis not present

## 2020-07-29 DIAGNOSIS — E785 Hyperlipidemia, unspecified: Secondary | ICD-10-CM | POA: Diagnosis not present

## 2020-07-29 DIAGNOSIS — I10 Essential (primary) hypertension: Secondary | ICD-10-CM | POA: Insufficient documentation

## 2020-07-29 DIAGNOSIS — C220 Liver cell carcinoma: Secondary | ICD-10-CM

## 2020-07-29 LAB — LACTATE DEHYDROGENASE: LDH: 125 U/L (ref 98–192)

## 2020-07-29 LAB — CMP (CANCER CENTER ONLY)
ALT: 9 U/L (ref 0–44)
AST: 13 U/L — ABNORMAL LOW (ref 15–41)
Albumin: 3.5 g/dL (ref 3.5–5.0)
Alkaline Phosphatase: 120 U/L (ref 38–126)
Anion gap: 12 (ref 5–15)
BUN: 7 mg/dL — ABNORMAL LOW (ref 8–23)
CO2: 25 mmol/L (ref 22–32)
Calcium: 9.5 mg/dL (ref 8.9–10.3)
Chloride: 102 mmol/L (ref 98–111)
Creatinine: 0.69 mg/dL (ref 0.61–1.24)
GFR, Estimated: >60 mL/min (ref >60)
Glucose, Bld: 98 mg/dL (ref 70–99)
Potassium: 4 mmol/L (ref 3.5–5.1)
Sodium: 139 mmol/L (ref 135–145)
Total Bilirubin: 1.1 mg/dL (ref 0.3–1.2)
Total Protein: 7.8 g/dL (ref 6.5–8.1)

## 2020-07-29 LAB — CBC WITH DIFFERENTIAL (CANCER CENTER ONLY)
Abs Immature Granulocytes: 0.01 10*3/uL (ref 0.00–0.07)
Basophils Absolute: 0.1 10*3/uL (ref 0.0–0.1)
Basophils Relative: 1 %
Eosinophils Absolute: 0.2 10*3/uL (ref 0.0–0.5)
Eosinophils Relative: 3 %
HCT: 41.7 % (ref 39.0–52.0)
Hemoglobin: 14.3 g/dL (ref 13.0–17.0)
Immature Granulocytes: 0 %
Lymphocytes Relative: 14 %
Lymphs Abs: 1.2 10*3/uL (ref 0.7–4.0)
MCH: 29.6 pg (ref 26.0–34.0)
MCHC: 34.3 g/dL (ref 30.0–36.0)
MCV: 86.3 fL (ref 80.0–100.0)
Monocytes Absolute: 1 10*3/uL (ref 0.1–1.0)
Monocytes Relative: 13 %
Neutro Abs: 5.7 10*3/uL (ref 1.7–7.7)
Neutrophils Relative %: 69 %
Platelet Count: 356 10*3/uL (ref 150–400)
RBC: 4.83 MIL/uL (ref 4.22–5.81)
RDW: 13.4 % (ref 11.5–15.5)
WBC Count: 8.2 10*3/uL (ref 4.0–10.5)
nRBC: 0 % (ref 0.0–0.2)

## 2020-07-29 LAB — CEA (IN HOUSE-CHCC): CEA (CHCC-In House): 82.96 ng/mL — ABNORMAL HIGH (ref 0.00–5.00)

## 2020-07-29 NOTE — Telephone Encounter (Signed)
Good morning, I just received a message on this patient from his oncologist Dr. Lorenso Courier.  They are planning treatment for his metastatic [place] but he needs a EGD ASAP for variceal screening prior to starting this.  Can we get him in as soon as possible?  Please make him #1 on the cancellation list as well as this is quite urgent. ASA 3. Thank you

## 2020-07-29 NOTE — Telephone Encounter (Signed)
Called pt. He really needed the procedure done this week.I called endo. We can work patient in during lunch tomorrow. He will have to go for pre-op today at 3:45pm.   Called pt back and he was agreeable. Discussed instructions in detail with patient. Orders entered.

## 2020-07-29 NOTE — Progress Notes (Signed)
START OFF PATHWAY REGIMEN - Other   [id number]:Atezolizumab 1,200 mg IV D1 + Bevacizumab 15 mg/kg IV D1 q21 Days:   A cycle is every 21 Days:     Atezolizumab      Bevacizumab-xxxx   **Always confirm dose/schedule in your pharmacy ordering system**  Patient Characteristics: Intent of Therapy: Non-Curative / Palliative Intent, Discussed with Patient 

## 2020-07-29 NOTE — Patient Instructions (Signed)
    CORBY VANDENBERGHE  07/29/2020     @PREFPERIOPPHARMACY @   Your procedure is scheduled on 07/30/2020.    Report to [facility] at 9:45 A.M.    Call this number if you have problems the morning of surgery:  [phone]   Remember:  Do not eat or drink after midnight.   Please follow the diet and prep instructions given to you by Dr Ave Filter office.        Take these medicines the morning of surgery with A SIP OF WATER : Xanax and Flonase    Do not wear jewelry, make-up or nail polish.  Do not wear lotions, powders, or perfumes, or deodorant.  Do not shave 48 hours prior to surgery.  Men may shave face and neck.  Do not bring valuables to the hospital.  [facility] is not responsible for any belongings or valuables.  Contacts, dentures or bridgework may not be worn into surgery.  Leave your suitcase in the car.  After surgery it may be brought to your room.  For patients admitted to the hospital, discharge time will be determined by your treatment team.  Patients discharged the day of surgery will not be allowed to drive home.   Name and phone number of your driver:   family Special instructions:   Please read over the following fact sheets that you were given. Care and Recovery After Surgery

## 2020-07-30 ENCOUNTER — Ambulatory Visit (HOSPITAL_COMMUNITY): Payer: BC Managed Care – PPO | Admitting: Anesthesiology

## 2020-07-30 ENCOUNTER — Encounter (HOSPITAL_COMMUNITY)
Admission: RE | Disposition: A | Discharge status: Home / Self Care | Payer: Self-pay | Source: Home / Self Care | Attending: Internal Medicine

## 2020-07-30 ENCOUNTER — Other Ambulatory Visit: Payer: Self-pay

## 2020-07-30 ENCOUNTER — Encounter (HOSPITAL_COMMUNITY): Payer: Self-pay

## 2020-07-30 ENCOUNTER — Ambulatory Visit (HOSPITAL_COMMUNITY)
Admission: RE | Admit: 2020-07-30 | Discharge: 2020-07-30 | Disposition: A | Discharge status: Home / Self Care | Payer: BC Managed Care – PPO | Attending: Internal Medicine | Admitting: Internal Medicine

## 2020-07-30 DIAGNOSIS — Z888 Allergy status to other drugs, medicaments and biological substances status: Secondary | ICD-10-CM | POA: Diagnosis not present

## 2020-07-30 DIAGNOSIS — K222 Esophageal obstruction: Secondary | ICD-10-CM | POA: Diagnosis not present

## 2020-07-30 DIAGNOSIS — K7460 Unspecified cirrhosis of liver: Secondary | ICD-10-CM | POA: Diagnosis not present

## 2020-07-30 DIAGNOSIS — Z8505 Personal history of malignant neoplasm of liver: Secondary | ICD-10-CM | POA: Insufficient documentation

## 2020-07-30 DIAGNOSIS — K449 Diaphragmatic hernia without obstruction or gangrene: Secondary | ICD-10-CM | POA: Diagnosis not present

## 2020-07-30 DIAGNOSIS — K2970 Gastritis, unspecified, without bleeding: Secondary | ICD-10-CM | POA: Diagnosis not present

## 2020-07-30 DIAGNOSIS — R1310 Dysphagia, unspecified: Secondary | ICD-10-CM

## 2020-07-30 DIAGNOSIS — K766 Portal hypertension: Secondary | ICD-10-CM

## 2020-07-30 DIAGNOSIS — Z79899 Other long term (current) drug therapy: Secondary | ICD-10-CM | POA: Diagnosis not present

## 2020-07-30 DIAGNOSIS — I10 Essential (primary) hypertension: Secondary | ICD-10-CM | POA: Diagnosis not present

## 2020-07-30 HISTORY — PX: ESOPHAGOGASTRODUODENOSCOPY (EGD) WITH PROPOFOL: SHX5813

## 2020-07-30 HISTORY — PX: BALLOON DILATION: SHX5330

## 2020-07-30 LAB — CA 125: Cancer Antigen (CA) 125: 173 U/mL

## 2020-07-30 LAB — CANCER ANTIGEN 19-9: CA 19-9: 7172 U/mL — ABNORMAL HIGH (ref 0–35)

## 2020-07-30 LAB — AFP TUMOR MARKER: AFP, Serum, Tumor Marker: 1.8 ng/mL (ref 0.0–8.4)

## 2020-07-30 SURGERY — ESOPHAGOGASTRODUODENOSCOPY (EGD) WITH PROPOFOL
Anesthesia: General

## 2020-07-30 MED ORDER — LACTATED RINGERS IV SOLN: INTRAVENOUS | Status: DC

## 2020-07-30 MED ORDER — PROPOFOL 10 MG/ML IV BOLUS
INTRAVENOUS | Status: DC | PRN
  Administered 2020-07-30: 50 mg via INTRAVENOUS
  Administered 2020-07-30: 100 mg via INTRAVENOUS
  Administered 2020-07-30: 60 mg via INTRAVENOUS
  Administered 2020-07-30: 30 mg via INTRAVENOUS

## 2020-07-30 MED ORDER — STERILE WATER FOR IRRIGATION IR SOLN
Status: DC | PRN
  Administered 2020-07-30: 100 mL

## 2020-07-30 MED ORDER — LIDOCAINE HCL (CARDIAC) PF 100 MG/5ML IV SOSY
PREFILLED_SYRINGE | INTRAVENOUS | Status: DC | PRN
  Administered 2020-07-30: 50 mg via INTRAVENOUS

## 2020-07-30 NOTE — Anesthesia Procedure Notes (Signed)
Date/Time: 07/30/2020 12:18 PM Performed by: Orlie Dakin, CRNA Pre-anesthesia Checklist: Emergency Drugs available, Patient identified, Suction available and Patient being monitored Patient Re-evaluated:Patient Re-evaluated prior to induction Oxygen Delivery Method: Nasal cannula Induction Type: IV induction Placement Confirmation: positive ETCO2

## 2020-07-30 NOTE — Anesthesia Preprocedure Evaluation (Addendum)
Anesthesia Evaluation  Patient identified by MRN, date of birth, ID band Patient awake    Reviewed: Allergy & Precautions, NPO status , Patient's Chart, lab work & pertinent test results  Airway Mallampati: II  TM Distance: >3 FB Neck ROM: Full   Comment: Neck sx Dental  (+) Dental Advisory Given, Chipped,    Pulmonary neg pulmonary ROS,    Pulmonary exam normal breath sounds clear to auscultation       Cardiovascular Exercise Tolerance: Good hypertension, Pt. on medications Normal cardiovascular exam Rhythm:Regular Rate:Normal     Neuro/Psych Anxiety negative neurological ROS  negative psych ROS   GI/Hepatic negative GI ROS, (+) Cirrhosis  (hepatocellular carcinoma)      ,   Endo/Other  negative endocrine ROS  Renal/GU negative Renal ROS     Musculoskeletal  (+) Arthritis  (neck sx),   Abdominal   Peds  Hematology negative hematology ROS (+)   Anesthesia Other Findings   Reproductive/Obstetrics                           Anesthesia Physical Anesthesia Plan  ASA: 3  Anesthesia Plan: General   Post-op Pain Management:    Induction: Intravenous  PONV Risk Score and Plan: Propofol infusion  Airway Management Planned: Nasal Cannula and Natural Airway  Additional Equipment:   Intra-op Plan:   Post-operative Plan:   Informed Consent: I have reviewed the patients History and Physical, chart, labs and discussed the procedure including the risks, benefits and alternatives for the proposed anesthesia with the patient or authorized representative who has indicated his/her understanding and acceptance.     Dental advisory given  Plan Discussed with: CRNA and Surgeon  Anesthesia Plan Comments:        Anesthesia Quick Evaluation

## 2020-07-30 NOTE — Transfer of Care (Signed)
Immediate Anesthesia Transfer of Care Note  Patient: Logan Alvarez  Procedure(s) Performed: ESOPHAGOGASTRODUODENOSCOPY (EGD) WITH PROPOFOL BALLOON DILATION  Patient Location: Short Stay  Anesthesia Type:General  Level of Consciousness: drowsy  Airway & Oxygen Therapy: Patient Spontanous Breathing  Post-op Assessment: Report given to RN and Post -op Vital signs reviewed and stable  Post vital signs: Reviewed and stable  Last Vitals:  Vitals Value Taken Time  BP    Temp    Pulse    Resp    SpO2      Last Pain:  Vitals:   07/30/20 1214  TempSrc:   PainSc: 0-No pain         Complications: No notable events documented.

## 2020-07-30 NOTE — Discharge Instructions (Signed)
EGD Discharge instructions Please read the instructions outlined below and refer to this sheet in the next few weeks. These discharge instructions provide you with general information on caring for yourself after you leave the hospital. Your doctor may also give you specific instructions. While your treatment has been planned according to the most current medical practices available, unavoidable complications occasionally occur. If you have any problems or questions after discharge, please call your doctor. ACTIVITY You may resume your regular activity but move at a slower pace for the next 24 hours.  Take frequent rest periods for the next 24 hours.  Walking will help expel (get rid of) the air and reduce the bloated feeling in your abdomen.  No driving for 24 hours (because of the anesthesia (medicine) used during the test).  You may shower.  Do not sign any important legal documents or operate any machinery for 24 hours (because of the anesthesia used during the test).  NUTRITION Drink plenty of fluids.  You may resume your normal diet.  Begin with a light meal and progress to your normal diet.  Avoid alcoholic beverages for 24 hours or as instructed by your caregiver.  MEDICATIONS You may resume your normal medications unless your caregiver tells you otherwise.  WHAT YOU CAN EXPECT TODAY You may experience abdominal discomfort such as a feeling of fullness or "gas" pains.  FOLLOW-UP Your doctor will discuss the results of your test with you.  SEEK IMMEDIATE MEDICAL ATTENTION IF ANY OF THE FOLLOWING OCCUR: Excessive nausea (feeling sick to your stomach) and/or vomiting.  Severe abdominal pain and distention (swelling).  Trouble swallowing.  Temperature over 101 F (37.8 C).  Rectal bleeding or vomiting of blood.   You did not have any varices in your esophagus. Okay to proceed with immunotherapy. You did have a tight schatzki ring which I dilated today. I will discuss with your  oncologist about starting PPI therapy. Follow up with GI in 4 months   I hope you have a great rest of your week!  Elon Alas. Abbey Chatters, D.O. Gastroenterology and Hepatology [facility]

## 2020-07-30 NOTE — H&P (Signed)
Primary Care Physician:  Kathyrn Drown, MD Primary Gastroenterologist:  Dr. Abbey Chatters  Pre-Procedure History & Physical: HPI:  Logan Alvarez is a 65 y.o. male is here for an EGD for variceal screening due to history of metastatic [place] with plans for treatment with bevacizumab/atezolizumab.  Past Medical History:  Diagnosis Date   Cancer ([place]) 2020   liver tumor removed   Chest pain    a. 2008: Normal ETT;  b. 01/2013 CTA: Ca score of 715 (92%'ile), LAD 50p, D1 50-75, RCA 50p.   Hyperlipidemia    Hypertension    Testosterone deficiency 2009    Past Surgical History:  Procedure Laterality Date   COLONOSCOPY N/A 03/12/2017   Procedure: COLONOSCOPY;  Surgeon: Danie Binder, MD;  Location: [facility];  Service: Endoscopy;  Laterality: N/A;  2:00   COLONOSCOPY WITH PROPOFOL N/A 06/18/2020   Procedure: COLONOSCOPY WITH PROPOFOL;  Surgeon: Eloise Harman, DO;  Location: [facility];  Service: Endoscopy;  Laterality: N/A;  pm appt   LEFT HEART CATHETERIZATION WITH CORONARY ANGIOGRAM N/A 02/01/2013   Procedure: LEFT HEART CATHETERIZATION WITH CORONARY ANGIOGRAM;  Surgeon: Josue Hector, MD;  Location: [facility] CATH LAB;  Service: Cardiovascular;  Laterality: N/A;   NECK SURGERY     POLYPECTOMY  06/18/2020   Procedure: POLYPECTOMY;  Surgeon: Eloise Harman, DO;  Location: [facility];  Service: Endoscopy;;  snare and biospy    Prior to Admission medications   Medication Sig Start Date End Date Taking? Authorizing Provider  ALPRAZolam Duanne Moron) 0.5 MG tablet 1/2 tablet to 1 tablet up to 3 times a day as needed for anxiety 07/26/20  Yes Luking, Elayne Snare, MD  fluticasone (FLONASE) 50 MCG/ACT nasal spray Place into the nose.   Yes [provider]  ibuprofen (ADVIL,MOTRIN) 200 MG tablet Take 400 mg by mouth every 8 (eight) hours as needed (for pain.).   Yes [provider]  pravastatin (PRAVACHOL) 20 MG tablet Take 1 tablet (20 mg total) by mouth daily. 03/18/20  Yes Luking,  Elayne Snare, MD  sodium chloride (OCEAN) 0.65 % nasal spray 1 spray as needed for Congestion   Yes [provider]  dextromethorphan-guaiFENesin (ROBITUSSIN-DM) 10-100 MG/5ML liquid Take by mouth.    [provider]  Multiple Vitamin (MULTIVITAMIN) tablet Take 1 tablet by mouth daily.    [provider]    Allergies as of 07/29/2020 - Review Complete 07/29/2020  Allergen Reaction Noted   Cymbalta [duloxetine hcl]  08/28/2016   Lodine [etodolac] Nausea And Vomiting 05/20/2012   Zoloft [sertraline hcl]  01/29/2017    Family History  Problem Relation Age of Onset   Heart attack Father        59's    Social History   Socioeconomic History   Marital status: Widowed    Spouse name: Not on file   Number of children: Not on file   Years of education: Not on file   Highest education level: Not on file  Occupational History   Not on file  Tobacco Use   Smoking status: Never   Smokeless tobacco: Never  Vaping Use   Vaping Use: Never used  Substance and Sexual Activity   Alcohol use: Yes    Comment: 4-6 beers a day   Drug use: No   Sexual activity: Not on file  Other Topics Concern   Not on file  Social History Narrative   Not on file   Social Determinants of Health  Financial Resource Strain: Not on file  Food Insecurity: Not on file  Transportation Needs: Not on file  Physical Activity: Not on file  Stress: Not on file  Social Connections: Not on file  Intimate Partner Violence: Not on file    Review of Systems: See HPI, otherwise negative ROS  Physical Exam: Vital signs in last 24 hours: Temp:  [98.2 F (36.8 C)] 98.2 F (36.8 C) (06/28 1030) Pulse Rate:  [74] 74 (06/28 1030) Resp:  [14] 14 (06/28 1030) BP: (121)/(84) 121/84 (06/28 1030) SpO2:  [99 %] 99 % (06/28 1030) Weight:  [98.5 kg] 98.5 kg (06/28 1030)   General:   Alert,  Well-developed, well-nourished, pleasant and cooperative in NAD Head:  Normocephalic and  atraumatic. Eyes:  Sclera clear, no icterus.   Conjunctiva pink. Ears:  Normal auditory acuity. Nose:  No deformity, discharge,  or lesions. Mouth:  No deformity or lesions, dentition normal. Neck:  Supple; no masses or thyromegaly. Lungs:  Clear throughout to auscultation.   No wheezes, crackles, or rhonchi. No acute distress. Heart:  Regular rate and rhythm; no murmurs, clicks, rubs,  or gallops. Abdomen:  Soft, nontender and nondistended. No masses, hepatosplenomegaly or hernias noted. Normal bowel sounds, without guarding, and without rebound.   Msk:  Symmetrical without gross deformities. Normal posture. Extremities:  Without clubbing or edema. Neurologic:  Alert and  oriented x4;  grossly normal neurologically. Skin:  Intact without significant lesions or rashes. Cervical Nodes:  No significant cervical adenopathy. Psych:  Alert and cooperative. Normal mood and affect.  Impression/Plan: TIGE MEAS is here for an EGD for variceal screening due to history of metastatic [place] with plans for treatment with bevacizumab/atezolizumab.  The risks of the procedure including infection, bleed, or perforation as well as benefits, limitations, alternatives and imponderables have been reviewed with the patient. Questions have been answered. All parties agreeable.

## 2020-07-30 NOTE — Anesthesia Postprocedure Evaluation (Signed)
Anesthesia Post Note  Patient: Logan Alvarez  Procedure(s) Performed: ESOPHAGOGASTRODUODENOSCOPY (EGD) WITH PROPOFOL BALLOON DILATION  Patient location during evaluation: Phase II Anesthesia Type: General Level of consciousness: awake and alert and oriented Pain management: pain level controlled Vital Signs Assessment: post-procedure vital signs reviewed and stable Respiratory status: spontaneous breathing and respiratory function stable Cardiovascular status: blood pressure returned to baseline and stable Postop Assessment: no apparent nausea or vomiting Anesthetic complications: no   No notable events documented.   Last Vitals:  Vitals:   07/30/20 1030 07/30/20 1232  BP: 121/84 108/76  Pulse: 74 80  Resp: 14 16  Temp: 36.8 C 36.9 C  SpO2: 99% 99%    Last Pain:  Vitals:   07/30/20 1232  TempSrc: Oral  PainSc: 0-No pain                 Deannie Resetar C Vivianna Piccini

## 2020-07-30 NOTE — Op Note (Signed)
[facility] Patient Name: Logan Alvarez Procedure Date: 07/30/2020 12:04 PM MRN: [id number] Date of Birth: 01/02/1956 Attending MD: Elon Alas. Edgar Frisk CSN: [id number] Age: 65 Admit Type: Outpatient Procedure:                Upper GI endoscopy Indications:              Dysphagia, Portal hypertension rule out esophageal                            varices Providers:                Elon Alas. Abbey Chatters, DO, Jessica Boudreaux, Randa Spike, Technician Referring MD:              Medicines:                See the Anesthesia note for documentation of the                            administered medications Complications:            No immediate complications. Estimated Blood Loss:     Estimated blood loss was minimal. Procedure:                Pre-Anesthesia Assessment:                           - The anesthesia plan was to use monitored                            anesthesia care (MAC).                           After obtaining informed consent, the endoscope was                            passed under direct vision. Throughout the                            procedure, the patient's blood pressure, pulse, and                            oxygen saturations were monitored continuously. The                            [id number]-H190 ([id number]) scope was introduced through the                            mouth, and advanced to the second part of duodenum.                            The upper GI endoscopy was accomplished without                            difficulty. The patient tolerated the procedure  well. Scope In: 12:16:36 PM Scope Out: 12:24:10 PM Total Procedure Duration: 0 hours 7 minutes 34 seconds  Findings:      There is no endoscopic evidence of varices in the entire esophagus.      A small hiatal hernia was present.      A moderate-severe Schatzki ring was found in the distal esophagus. A TTS       dilator was passed through the  scope. Dilation with a 11-13-10 mm       balloon dilator was performed to 12 mm. The dilation site was examined       and showed mild mucosal disruption and moderate improvement in luminal       narrowing. Estimated blood loss was minimal.      Patchy mild inflammation characterized by erythema was found in the       entire examined stomach.      The duodenal bulb, first portion of the duodenum and second portion of       the duodenum were normal. Impression:               - Small hiatal hernia.                           - Severe Schatzki ring. Dilated.                           - Gastritis.                           - Normal duodenal bulb, first portion of the                            duodenum and second portion of the duodenum.                           - No specimens collected. Moderate Sedation:      Per Anesthesia Care Recommendation:           - Patient has a contact number available for                            emergencies. The signs and symptoms of potential                            delayed complications were discussed with the                            patient. Return to normal activities tomorrow.                            Written discharge instructions were provided to the                            patient.                           - Resume previous diet.                           -  Continue present medications.                           - Repeat upper endoscopy in 6 weeks for retreatment.                           - Would like to place patient on daily PPI. Will                            discuss with patient's oncologist prior to starting                            to ensure no contraindications from immunotherapy                            standpoint. Procedure Code(s):        --- Professional ---                           [phone], Esophagogastroduodenoscopy, flexible,                            transoral; with transendoscopic balloon dilation of                             esophagus (less than 30 mm diameter) Diagnosis Code(s):        --- Professional ---                           K44.9, Diaphragmatic hernia without obstruction or                            gangrene                           K22.2, Esophageal obstruction                           K29.70, Gastritis, unspecified, without bleeding                           R13.10, Dysphagia, unspecified                           K76.6, Portal hypertension CPT copyright 2019 American Medical Association. All rights reserved. The codes documented in this report are preliminary and upon coder review may  be revised to meet current compliance requirements. Elon Alas. Abbey Chatters, DO [place] Abbey Chatters, DO 07/30/2020 12:36:59 PM This report has been signed electronically. Number of Addenda: 0

## 2020-08-02 ENCOUNTER — Encounter: Payer: Self-pay | Admitting: Hematology and Oncology

## 2020-08-02 ENCOUNTER — Other Ambulatory Visit: Payer: Self-pay

## 2020-08-02 ENCOUNTER — Inpatient Hospital Stay: Payer: BC Managed Care – PPO | Attending: Hematology and Oncology

## 2020-08-02 ENCOUNTER — Telehealth: Payer: Self-pay | Admitting: Hematology and Oncology

## 2020-08-02 DIAGNOSIS — Z5112 Encounter for antineoplastic immunotherapy: Secondary | ICD-10-CM | POA: Insufficient documentation

## 2020-08-02 DIAGNOSIS — I10 Essential (primary) hypertension: Secondary | ICD-10-CM | POA: Insufficient documentation

## 2020-08-02 DIAGNOSIS — C220 Liver cell carcinoma: Secondary | ICD-10-CM | POA: Insufficient documentation

## 2020-08-02 DIAGNOSIS — C786 Secondary malignant neoplasm of retroperitoneum and peritoneum: Secondary | ICD-10-CM | POA: Insufficient documentation

## 2020-08-02 DIAGNOSIS — E785 Hyperlipidemia, unspecified: Secondary | ICD-10-CM | POA: Insufficient documentation

## 2020-08-02 DIAGNOSIS — Z79899 Other long term (current) drug therapy: Secondary | ICD-10-CM | POA: Insufficient documentation

## 2020-08-02 NOTE — Progress Notes (Signed)
Met with patient at registration to introduce myself as Arboriculturist and to offer available resources.  Discussed one-time $1000 Radio broadcast assistant to assist with personal expenses while going through treatment. Also, discussed [place] whom provides financial assistance for patients whom live in [place] only. Gave him the application if interested in applying.  Gave him my card if interested in applying for grant and for any additional financial questions or concerns.

## 2020-08-02 NOTE — Telephone Encounter (Signed)
Per 06/27 los, pt aware

## 2020-08-06 ENCOUNTER — Encounter (HOSPITAL_COMMUNITY): Payer: Self-pay | Admitting: Internal Medicine

## 2020-08-08 NOTE — Progress Notes (Signed)
Pharmacist Chemotherapy Monitoring - Initial Assessment    Anticipated start date: 08/15/20   The following has been reviewed per standard work regarding the patient's treatment regimen: The patient's diagnosis, treatment plan and drug doses, and organ/hematologic function Lab orders and baseline tests specific to treatment regimen  The treatment plan start date, drug sequencing, and pre-medications Prior authorization status  Patient's documented medication list, including drug-drug interaction screen and prescriptions for anti-emetics and supportive care specific to the treatment regimen The drug concentrations, fluid compatibility, administration routes, and timing of the medications to be used The patient's access for treatment and lifetime cumulative dose history, if applicable  The patient's medication allergies and previous infusion related reactions, if applicable   Changes made to treatment plan:  N/A  Follow up needed:  N/A   Logan Alvarez K, RPH, 08/08/2020  12:29 PM

## 2020-08-15 ENCOUNTER — Other Ambulatory Visit: Payer: Self-pay

## 2020-08-15 ENCOUNTER — Inpatient Hospital Stay: Payer: BC Managed Care – PPO

## 2020-08-15 ENCOUNTER — Inpatient Hospital Stay (HOSPITAL_BASED_OUTPATIENT_CLINIC_OR_DEPARTMENT_OTHER): Payer: BC Managed Care – PPO | Admitting: Hematology and Oncology

## 2020-08-15 ENCOUNTER — Inpatient Hospital Stay: Payer: BC Managed Care – PPO | Admitting: Nutrition

## 2020-08-15 ENCOUNTER — Other Ambulatory Visit: Payer: Self-pay | Admitting: Hematology and Oncology

## 2020-08-15 VITALS — BP 113/78 | HR 76 | Temp 98.3°F | Resp 17 | Ht 68.0 in | Wt 215.1 lb

## 2020-08-15 DIAGNOSIS — C786 Secondary malignant neoplasm of retroperitoneum and peritoneum: Secondary | ICD-10-CM | POA: Diagnosis not present

## 2020-08-15 DIAGNOSIS — Z7189 Other specified counseling: Secondary | ICD-10-CM

## 2020-08-15 DIAGNOSIS — C220 Liver cell carcinoma: Secondary | ICD-10-CM | POA: Diagnosis not present

## 2020-08-15 DIAGNOSIS — Z79899 Other long term (current) drug therapy: Secondary | ICD-10-CM | POA: Diagnosis not present

## 2020-08-15 DIAGNOSIS — I10 Essential (primary) hypertension: Secondary | ICD-10-CM | POA: Diagnosis not present

## 2020-08-15 DIAGNOSIS — E785 Hyperlipidemia, unspecified: Secondary | ICD-10-CM | POA: Diagnosis not present

## 2020-08-15 DIAGNOSIS — Z5112 Encounter for antineoplastic immunotherapy: Secondary | ICD-10-CM | POA: Diagnosis not present

## 2020-08-15 LAB — CMP (CANCER CENTER ONLY)
ALT: 9 U/L (ref 0–44)
AST: 14 U/L — ABNORMAL LOW (ref 15–41)
Albumin: 3.6 g/dL (ref 3.5–5.0)
Alkaline Phosphatase: 114 U/L (ref 38–126)
Anion gap: 10 (ref 5–15)
BUN: 9 mg/dL (ref 8–23)
CO2: 25 mmol/L (ref 22–32)
Calcium: 9.6 mg/dL (ref 8.9–10.3)
Chloride: 100 mmol/L (ref 98–111)
Creatinine: 0.73 mg/dL (ref 0.61–1.24)
GFR, Estimated: >60 mL/min (ref >60)
Glucose, Bld: 105 mg/dL — ABNORMAL HIGH (ref 70–99)
Potassium: 4 mmol/L (ref 3.5–5.1)
Sodium: 135 mmol/L (ref 135–145)
Total Bilirubin: 1 mg/dL (ref 0.3–1.2)
Total Protein: 7.7 g/dL (ref 6.5–8.1)

## 2020-08-15 LAB — CBC WITH DIFFERENTIAL (CANCER CENTER ONLY)
Abs Immature Granulocytes: 0.02 10*3/uL (ref 0.00–0.07)
Basophils Absolute: 0 10*3/uL (ref 0.0–0.1)
Basophils Relative: 0 %
Eosinophils Absolute: 0.2 10*3/uL (ref 0.0–0.5)
Eosinophils Relative: 2 %
HCT: 42.5 % (ref 39.0–52.0)
Hemoglobin: 14.5 g/dL (ref 13.0–17.0)
Immature Granulocytes: 0 %
Lymphocytes Relative: 15 %
Lymphs Abs: 1.2 10*3/uL (ref 0.7–4.0)
MCH: 29.2 pg (ref 26.0–34.0)
MCHC: 34.1 g/dL (ref 30.0–36.0)
MCV: 85.5 fL (ref 80.0–100.0)
Monocytes Absolute: 1.1 10*3/uL — ABNORMAL HIGH (ref 0.1–1.0)
Monocytes Relative: 12 %
Neutro Abs: 6 10*3/uL (ref 1.7–7.7)
Neutrophils Relative %: 71 %
Platelet Count: 324 10*3/uL (ref 150–400)
RBC: 4.97 MIL/uL (ref 4.22–5.81)
RDW: 13.6 % (ref 11.5–15.5)
WBC Count: 8.5 10*3/uL (ref 4.0–10.5)
nRBC: 0 % (ref 0.0–0.2)

## 2020-08-15 LAB — PROTEIN / CREATININE RATIO, URINE
Creatinine, Urine: 47.3 mg/dL
Total Protein, Urine: <6 mg/dL

## 2020-08-15 MED ORDER — SODIUM CHLORIDE 0.9 % IV SOLN
1200.0000 mg | Freq: Once | INTRAVENOUS | Status: AC
  Administered 2020-08-15: 1200 mg via INTRAVENOUS
  Filled 2020-08-15: qty 20

## 2020-08-15 MED ORDER — SODIUM CHLORIDE 0.9 % IV SOLN
Freq: Once | INTRAVENOUS | Status: AC
Start: 2020-08-15 — End: 2020-08-15
  Filled 2020-08-15: qty 250

## 2020-08-15 MED ORDER — SODIUM CHLORIDE 0.9 % IV SOLN
15.0000 mg/kg | Freq: Once | INTRAVENOUS | Status: AC
Start: 2020-08-15 — End: 2020-08-15
  Administered 2020-08-15: 1500 mg via INTRAVENOUS
  Filled 2020-08-15: qty 48

## 2020-08-15 NOTE — Progress Notes (Signed)
Patient was identified to be at risk for malnutrition on the MST secondary to weight loss and poor appetite.  65 year old male diagnosed with hepatocellular cancer receiving cycle 1 of Atezolizumab and bevacizumab.  He is being treated by Dr. Lorenso Courier.  Past medical history includes hyperlipidemia and hypertension.  Medications include multivitamin and Xanax.  Labs include glucose 105 and albumin 3.6.  Height: 68 inches. Weight: 215.1 pounds July 14. Usual body weight: 229 pounds in February 2022. BMI: 32.71. ECOG: 1.  Patient endorses poor appetite and unintentional weight loss.  He reports intermittent nausea and early satiety.  States he tried both boost and Ensure and experienced increased gas which was very uncomfortable.  Reports he stopped drinking these over the past weekend and symptoms have improved.  Patient has had a 6% weight loss over 5 months which is not significant.  Nutrition diagnosis:  Unintended weight loss related to hepatocellular cancer and associated treatments as evidenced by approximate 14 pound weight loss from usual body weight.  Intervention: Patient educated to consume smaller more frequent meals and snacks with adequate calories and protein for weight maintenance. Patient could try to consume boost or Ensure very slowly, 4 ounces at a time.  Increase as tolerated or discontinue if increased gas continues. Reviewed high-calorie, high-protein foods including oral nutrition supplements patient may tolerate better. Provided examples of snacks to choose between meals. Provided patient with fact sheets on high-calorie, high-protein foods, snacks, and recipes. Questions were answered and teach back method used.  Contact information given.  Monitoring, evaluation, goals: Patient will tolerate adequate calories and protein for weight maintenance.  Next visit: Wednesday, August 3 during infusion with Vinnie Level.  **Disclaimer: This note was dictated with voice  recognition software. Similar sounding words can inadvertently be transcribed and this note may contain transcription errors which may not have been corrected upon publication of note.**

## 2020-08-15 NOTE — Progress Notes (Signed)
[facility] [phone]   [phone]  PROGRESS NOTE  Patient Care Team: Kathyrn Drown, MD as PCP - General (Family Medicine) Eloise Harman, DO as Consulting Physician (Internal Medicine) Orson Slick, MD as Consulting Physician (Hematology and Oncology)  Hematological/Oncological History # Hepatocellular Carcinoma, Stage IV 10/19/17: abdomen u/s 1.7 x 1.4 x 2.0 cm solid-appearing hepatic nodule with echogenic rim consistent suggesting a calcified rim 11/08/17: liver needle core biopsy. Hepatocellular carcinoma, poorly differentiated in a background of necrosis. Reviewed at [facility], HepPar1, [place] [facility] 12-08-2017: robotic right partial hepatectomy. Path: T1b HCC 06/25/2020: CT abdomen shows nodular studding of the ventral mesenteric fat, findings consistent with peritoneal involvement/carcinomatosis. 06/25/2020: US guided biopsy consistent with poorly differentiated Hepatocelluar carcinoma. CA 19-9 5238, CEA 43.3 07/16/2020: clinic visit with Dr. Leamon [facility]nt at [facility]. Referred to [facility] for care closer to home 07/29/2020: establish care with Dr. Lorenso Courier  Interval History:  Logan Alvarez 65 y.o. male with medical history significant for stage IV HCC who presents for a follow up visit. The patient's last visit was on 07/29/2020 at which time he established care. In the interim since the last visit he has completed chemotherapy education and is ready to start Cycle 1 Day 1 of atezolizumab/Bevacizumab today.  On exam today Mr. Abdelaziz orts that he has continued to lose weight.  He is down to 215 pounds today from 220 pounds on 06/04/2020.  He notes that he does still feel some bloating and some pain in the lower portion of his abdomen.  He does endorse that his stools look normal and that he has some trouble "getting his stools moving".  He reports that once the stool flow starts he is able to pass but he has some to do some straining in  order to get it started.  He notes that he is going to pick up some senna and docusate in order to help try to move his bowels.  Otherwise his appetite has been good and has been taking boost/Ensure's in order to try to boost his weight.  He otherwise denies any fevers, chills, sweats, nausea, vomiting or diarrhea.  Denies any bleeding or bruising.  A full 10 point ROS is listed below.  MEDICAL HISTORY:  Past Medical History:  Diagnosis Date   Cancer ([place]) 2020   liver tumor removed   Chest pain    a. 2008: Normal ETT;  b. 01/2013 CTA: Ca score of 715 (92%'ile), LAD 50p, D1 50-75, RCA 50p.   Hyperlipidemia    Hypertension    Testosterone deficiency 2009    SURGICAL HISTORY: Past Surgical History:  Procedure Laterality Date   BALLOON DILATION  07/30/2020   Procedure: BALLOON DILATION;  Surgeon: Eloise Harman, DO;  [facility];  Service: Endoscopy;;   COLONOSCOPY N/A 03/12/2017   Procedure: COLONOSCOPY;  Surgeon: Danie Binder, MD;  [facility];  Service: Endoscopy;  Laterality: N/A;  2:00   COLONOSCOPY WITH PROPOFOL N/A 06/18/2020   Procedure: COLONOSCOPY WITH PROPOFOL;  Surgeon: Eloise Harman, DO;  Location: [facility];  Service: Endoscopy;  Laterality: N/A;  pm appt   ESOPHAGOGASTRODUODENOSCOPY (EGD) WITH PROPOFOL N/A 07/30/2020   Procedure: ESOPHAGOGASTRODUODENOSCOPY (EGD) WITH PROPOFOL;  Surgeon: Eloise Harman, DO;  [facility];  Service: Endoscopy;  Laterality: N/A;  11:45am   LEFT HEART CATHETERIZATION WITH CORONARY ANGIOGRAM N/A 02/01/2013   Procedure: LEFT HEART CATHETERIZATION WITH CORONARY ANGIOGRAM;  Surgeon:  Josue Hector, MD;  Location: [facility] CATH LAB;  Service: Cardiovascular;  Laterality: N/A;   NECK SURGERY     POLYPECTOMY  06/18/2020   Procedure: POLYPECTOMY;  Surgeon: Eloise Harman, DO;  Location: [facility];  Service: Endoscopy;;  snare and biospy    SOCIAL HISTORY: Social History   Socioeconomic History    Marital status: Widowed    Spouse name: Not on file   Number of children: Not on file   Years of education: Not on file   Highest education level: Not on file  Occupational History   Not on file  Tobacco Use   Smoking status: Never   Smokeless tobacco: Never  Vaping Use   Vaping Use: Never used  Substance and Sexual Activity   Alcohol use: Yes    Comment: 4-6 beers a day   Drug use: No   Sexual activity: Not on file  Other Topics Concern   Not on file  Social History Narrative   Not on file   Social Determinants of Health   Financial Resource Strain: Not on file  Food Insecurity: Not on file  Transportation Needs: Not on file  Physical Activity: Not on file  Stress: Not on file  Social Connections: Not on file  Intimate Partner Violence: Not on file    FAMILY HISTORY: Family History  Problem Relation Age of Onset   Heart attack Father        15's    ALLERGIES:  is allergic to cymbalta [duloxetine hcl], lodine [etodolac], and zoloft [sertraline hcl].  MEDICATIONS:  Current Outpatient Medications  Medication Sig Dispense Refill   ALPRAZolam (XANAX) 0.5 MG tablet 1/2 tablet to 1 tablet up to 3 times a day as needed for anxiety 90 tablet 1   dextromethorphan-guaiFENesin (ROBITUSSIN-DM) 10-100 MG/5ML liquid Take by mouth.     fluticasone (FLONASE) 50 MCG/ACT nasal spray Place into the nose.     ibuprofen (ADVIL,MOTRIN) 200 MG tablet Take 400 mg by mouth every 8 (eight) hours as needed (for pain.).     pravastatin (PRAVACHOL) 20 MG tablet Take 1 tablet (20 mg total) by mouth daily. 90 tablet 1   sodium chloride (OCEAN) 0.65 % nasal spray 1 spray as needed for Congestion     No current facility-administered medications for this visit.    REVIEW OF SYSTEMS:   Constitutional: ( - ) fevers, ( - )  chills , ( - ) night sweats Eyes: ( - ) blurriness of vision, ( - ) double vision, ( - ) watery eyes Ears, nose, mouth, throat, and face: ( - ) mucositis, ( - ) sore  throat Respiratory: ( - ) cough, ( - ) dyspnea, ( - ) wheezes Cardiovascular: ( - ) palpitation, ( - ) chest discomfort, ( - ) lower extremity swelling Gastrointestinal:  ( - ) nausea, ( - ) heartburn, ( - ) change in bowel habits Skin: ( - ) abnormal skin rashes Lymphatics: ( - ) new lymphadenopathy, ( - ) easy bruising Neurological: ( - ) numbness, ( - ) tingling, ( - ) new weaknesses Behavioral/Psych: ( - ) mood change, ( - ) new changes  All other systems were reviewed with the patient and are negative.  PHYSICAL EXAMINATION: ECOG PERFORMANCE STATUS: 1 - Symptomatic but completely ambulatory  Vitals:   08/15/20 1020  BP: 113/78  Pulse: 76  Resp: 17  Temp: 98.3 F (36.8 C)  SpO2: 99%   Filed Weights   08/15/20 1020  Weight: 215 lb 1.6 oz (97.6 kg)    GENERAL: Well-appearing elderly Caucasian male, alert, no distress and comfortable SKIN: skin color, texture, turgor are normal, no rashes or significant lesions EYES: conjunctiva are pink and non-injected, sclera clear LUNGS: clear to auscultation and percussion with normal breathing effort HEART: regular rate & rhythm and no murmurs and no lower extremity edema Musculoskeletal: no cyanosis of digits and no clubbing  PSYCH: alert & oriented x 3, fluent speech NEURO: no focal motor/sensory deficits  LABORATORY DATA:  I have reviewed the data as listed CBC Latest Ref Rng & Units 08/15/2020 07/29/2020 06/04/2020  WBC 4.0 - 10.5 K/uL 8.5 8.2 10.1  Hemoglobin 13.0 - 17.0 g/dL 14.5 14.3 14.7  Hematocrit 39.0 - 52.0 % 42.5 41.7 44.2  Platelets 150 - 400 K/uL 324 356 410    CMP Latest Ref Rng & Units 08/15/2020 07/29/2020 06/14/2020  Glucose 70 - 99 mg/dL 105(H) 98 135(H)  BUN 8 - 23 mg/dL 9 7(L) 10  Creatinine 0.61 - 1.24 mg/dL 0.73 0.69 0.65  Sodium 135 - 145 mmol/L 135 139 134(L)  Potassium 3.5 - 5.1 mmol/L 4.0 4.0 3.6  Chloride 98 - 111 mmol/L 100 102 100  CO2 22 - 32 mmol/L 25 25 27   Calcium 8.9 - 10.3 mg/dL 9.6 9.5 9.5   Total Protein 6.5 - 8.1 g/dL 7.7 7.8 -  Total Bilirubin 0.3 - 1.2 mg/dL 1.0 1.1 -  Alkaline Phos 38 - 126 U/L 114 120 -  AST 15 - 41 U/L 14(L) 13(L) -  ALT 0 - 44 U/L 9 9 -    RADIOGRAPHIC STUDIES: No results found.  ASSESSMENT & PLAN Logan Alvarez 65 y.o. male with medical history significant for stage IV HCC who presents for a follow up visit.   After review of the labs, review of the records, and discussion with the patient the patients findings are most consistent with recurrent metastatic hepatocellular carcinoma today I discussed the treatment options moving forward this patient.  It appears though these treatment options were already previously discussed with Dr. Leamon Arnt at [facility].  The options as I see it would be bevacizumab and atezolizumab, sorafenib, or lenvatinib.  After the risks and benefits of each were discussed the patient noted he would like to proceed with bevacizumab and atezolizumab.  EGD performed 07/30/2020, no evidence of varices Marcello Moores Score: A  # Hepatocellular Carcinoma, Stage IV --At this time I favor starting the patient on bevacizumab and atezolizumab q. 21 days.  Patient is considered stage IV due to peritoneal carcinomatosis of HCC. --EGD performed, no evidence of varices --Patient has recent imaging from 06/25/2020.  We will plan to have imaging repeated q. 3 months. Next due August 2022.  --today is Cycle 1 Day 1 of treatment (08/15/2020) --Return to clinic in 3 weeks for Cycle 2 Day 1   #Supportive Care -- chemotherapy education performed -- port placement offered, declined at this time -- No antiemetics required as this is not an emetogenic treatment -- no pain medication required at this time.   No orders of the defined types were placed in this encounter.   All questions were answered. The patient knows to call the clinic with any problems, questions or concerns.  A total of more than 30 minutes were spent on this  encounter with face-to-face time and non-face-to-face time, including preparing to see the patient, ordering tests and/or medications, counseling the patient and coordination of care as outlined above.  Ledell Peoples, MD Department of Hematology/Oncology [facility] Phone: [phone] Pager: [phone] Email: [email or web address]  08/17/2020 6:39 PM

## 2020-08-15 NOTE — Patient Instructions (Signed)
[facility]  Discharge Instructions: Thank you for choosing [facility] to provide your oncology and hematology care.   If you have a lab appointment with the [facility], please go directly to the [facility] and check in at the registration area.   Wear comfortable clothing and clothing appropriate for easy access to any Portacath or PICC line.   We strive to give you quality time with your provider. You may need to reschedule your appointment if you arrive late (15 or more minutes).  Arriving late affects you and other patients whose appointments are after yours.  Also, if you miss three or more appointments without notifying the office, you may be dismissed from the clinic at the provider's discretion.      For prescription refill requests, have your pharmacy contact our office and allow 72 hours for refills to be completed.    Today you received the following chemotherapy and/or immunotherapy agents Tecentriq, Avastin    To help prevent nausea and vomiting after your treatment, we encourage you to take your nausea medication as directed.  BELOW ARE SYMPTOMS THAT SHOULD BE REPORTED IMMEDIATELY: *FEVER GREATER THAN 100.4 F (38 C) OR HIGHER *CHILLS OR SWEATING *NAUSEA AND VOMITING THAT IS NOT CONTROLLED WITH YOUR NAUSEA MEDICATION *UNUSUAL SHORTNESS OF BREATH *UNUSUAL BRUISING OR BLEEDING *URINARY PROBLEMS (pain or burning when urinating, or frequent urination) *BOWEL PROBLEMS (unusual diarrhea, constipation, pain near the anus) TENDERNESS IN MOUTH AND THROAT WITH OR WITHOUT PRESENCE OF ULCERS (sore throat, sores in mouth, or a toothache) UNUSUAL RASH, SWELLING OR PAIN  UNUSUAL VAGINAL DISCHARGE OR ITCHING   Items with * indicate a potential emergency and should be followed up as soon as possible or go to the Emergency Department if any problems should occur.  Please show the CHEMOTHERAPY ALERT CARD or IMMUNOTHERAPY ALERT CARD at  check-in to the Emergency Department and triage nurse.  Should you have questions after your visit or need to cancel or reschedule your appointment, please contact [facility]  Dept: [phone]  and follow the prompts.  Office hours are 8:00 a.m. to 4:30 p.m. Monday - Friday. Please note that voicemails left after 4:00 p.m. may not be returned until the following business day.  We are closed weekends and major holidays. You have access to a nurse at all times for urgent questions. Please call the main number to the clinic Dept: [phone] and follow the prompts.   For any non-urgent questions, you may also contact your provider using MyChart. We now offer e-Visits for anyone 63 and older to request care online for non-urgent symptoms. For details visit [email or web address].   Also download the MyChart app! Go to the app store, search "MyChart", open the app, select [facility], and log in with your MyChart username and password.  Due to Covid, a mask is required upon entering the hospital/clinic. If you do not have a mask, one will be given to you upon arrival. For doctor visits, patients may have 1 support person aged 3 or older with them. For treatment visits, patients cannot have anyone with them due to current Covid guidelines and our immunocompromised population.

## 2020-08-16 ENCOUNTER — Telehealth: Payer: Self-pay | Admitting: *Deleted

## 2020-08-16 ENCOUNTER — Encounter: Payer: Self-pay | Admitting: Hematology and Oncology

## 2020-08-16 NOTE — Telephone Encounter (Signed)
Called pt to see how he did with his treatment yesterday.  He reports treatment went well here but had feeling of being hot all over at home & feeling achey & tired. He mentioned having some moderate joint pain & is taking advil for this.  Discussed using Tylenol instead & pt will discuss advil with Dr Lorenso Courier.  Pt did not take his temp during time of being hot.  He slept last night & is not having hot feeling this am.  He plans to try to work some today.  Message routed to Dr Dorsey/Pod RN

## 2020-08-16 NOTE — Telephone Encounter (Signed)
-----   Message from Tildon Husky, RN sent at 08/15/2020  1:57 PM EDT ----- Regarding: first time treatment follow up call seen by dorsey Patient received avastin, tecentriq today for the first time. He tolerated treatment well. Seen here by Dr. Lorenso Courier

## 2020-08-17 ENCOUNTER — Encounter: Payer: Self-pay | Admitting: Hematology and Oncology

## 2020-09-02 ENCOUNTER — Ambulatory Visit (INDEPENDENT_AMBULATORY_CARE_PROVIDER_SITE_OTHER): Payer: BC Managed Care – PPO | Admitting: Orthopedic Surgery

## 2020-09-02 ENCOUNTER — Encounter: Payer: Self-pay | Admitting: Orthopedic Surgery

## 2020-09-02 ENCOUNTER — Ambulatory Visit: Payer: Self-pay

## 2020-09-02 ENCOUNTER — Ambulatory Visit: Payer: BC Managed Care – PPO

## 2020-09-02 DIAGNOSIS — M25571 Pain in right ankle and joints of right foot: Secondary | ICD-10-CM | POA: Diagnosis not present

## 2020-09-02 DIAGNOSIS — M79672 Pain in left foot: Secondary | ICD-10-CM

## 2020-09-02 DIAGNOSIS — M19071 Primary osteoarthritis, right ankle and foot: Secondary | ICD-10-CM

## 2020-09-02 DIAGNOSIS — M25572 Pain in left ankle and joints of left foot: Secondary | ICD-10-CM

## 2020-09-03 ENCOUNTER — Encounter: Payer: Self-pay | Admitting: Orthopedic Surgery

## 2020-09-03 ENCOUNTER — Other Ambulatory Visit: Payer: Self-pay | Admitting: Hematology and Oncology

## 2020-09-03 DIAGNOSIS — C220 Liver cell carcinoma: Secondary | ICD-10-CM

## 2020-09-03 NOTE — Progress Notes (Signed)
[name]   [phone]  PROGRESS NOTE  Patient Care Team: Kathyrn Drown, MD as PCP - General (Family Medicine) Eloise Harman, DO as Consulting Physician (Internal Medicine) Orson Slick, MD as Consulting Physician (Hematology and Oncology)  Hematological/Oncological History # Hepatocellular Carcinoma, Stage IV 10/19/17: abdomen u/s 1.7 x 1.4 x 2.0 cm solid-appearing hepatic nodule with echogenic rim consistent suggesting a calcified rim 11/08/17: liver needle core biopsy. Hepatocellular carcinoma, poorly differentiated in a background of necrosis. Reviewed at [facility], HepPar1, [place] [facility] 12-08-2017: robotic right partial hepatectomy. Path: T1b HCC 06/25/2020: CT abdomen shows nodular studding of the ventral mesenteric fat, findings consistent with peritoneal involvement/carcinomatosis. 06/25/2020: US guided biopsy consistent with poorly differentiated Hepatocelluar carcinoma. CA 19-9 5238, CEA 43.3 07/16/2020: clinic visit with Dr. Leamon Arnt at [facility]. Referred to [facility] for care closer to home 07/29/2020: establish care with Dr. Lorenso Courier 08/15/2020: Cycle 1 Day 1 of Atezolizumab/Bevacizumab 09/04/2020: Cycle 2 Day 1 of Atezolizumab/Bevacizumab  Interval History:  Logan Alvarez 65 y.o. male with medical history significant for stage IV HCC who presents for a follow up visit. The patient's last visit was on 08/14/2020 at which time he started Cycle 1 of atezo/bev. In the interim since the last visit he has had no major changes in his health.   On exam today Logan Alvarez reports he did have some trouble with his last round of treatment.  He notes he was developing aches, fevers and was "overall tired".  He does continue to have some fatigue.  He reports that he still feels wiped out from his last cycle.  He does take ibuprofen for this and is hoping to increase his dose to the maximum in order to try to help.  Fortunately  there have been no other symptoms from his treatment.  He otherwise denies any fevers, chills, sweats, nausea, vomiting or diarrhea.  Denies any bleeding or bruising.  A full 10 point ROS is listed below.  MEDICAL HISTORY:  Past Medical History:  Diagnosis Date   Cancer ([place]) 2020   liver tumor removed   Chest pain    a. 2008: Normal ETT;  b. 01/2013 CTA: Ca score of 715 (92%'ile), LAD 50p, D1 50-75, RCA 50p.   Hyperlipidemia    Hypertension    Testosterone deficiency 2009    SURGICAL HISTORY: Past Surgical History:  Procedure Laterality Date   BALLOON DILATION  07/30/2020   Procedure: BALLOON DILATION;  Surgeon: Eloise Harman, DO;  [facility];  Service: Endoscopy;;   COLONOSCOPY N/A 03/12/2017   Procedure: COLONOSCOPY;  Surgeon: Danie Binder, MD;  [facility];  Service: Endoscopy;  Laterality: N/A;  2:00   COLONOSCOPY WITH PROPOFOL N/A 06/18/2020   Procedure: COLONOSCOPY WITH PROPOFOL;  Surgeon: Eloise Harman, DO;  [facility];  Service: Endoscopy;  Laterality: N/A;  pm appt   ESOPHAGOGASTRODUODENOSCOPY (EGD) WITH PROPOFOL N/A 07/30/2020   Procedure: ESOPHAGOGASTRODUODENOSCOPY (EGD) WITH PROPOFOL;  Surgeon: Eloise Harman, DO;  [facility];  Service: Endoscopy;  Laterality: N/A;  11:45am   LEFT HEART CATHETERIZATION WITH CORONARY ANGIOGRAM N/A 02/01/2013   Procedure: LEFT HEART CATHETERIZATION WITH CORONARY ANGIOGRAM;  Surgeon: Josue Hector, MD;  Location: [facility] CATH LAB;  Service: Cardiovascular;  Laterality: N/A;   NECK SURGERY     POLYPECTOMY  06/18/2020   Procedure: POLYPECTOMY;  Surgeon: Eloise Harman, DO;  [facility];  Service: Endoscopy;;  snare and biospy    SOCIAL HISTORY: Social History   Socioeconomic History   Marital status: Widowed    Spouse name: Not on file   Number of children: Not on file   Years of education: Not on file   Highest education level: Not on file  Occupational History    Not on file  Tobacco Use   Smoking status: Never   Smokeless tobacco: Never  Vaping Use   Vaping Use: Never used  Substance and Sexual Activity   Alcohol use: Yes    Comment: 4-6 beers a day   Drug use: No   Sexual activity: Not on file  Other Topics Concern   Not on file  Social History Narrative   Not on file   Social Determinants of Health   Financial Resource Strain: Not on file  Food Insecurity: Not on file  Transportation Needs: Not on file  Physical Activity: Not on file  Stress: Not on file  Social Connections: Not on file  Intimate Partner Violence: Not on file    FAMILY HISTORY: Family History  Problem Relation Age of Onset   Heart attack Father        69's    ALLERGIES:  is allergic to cymbalta [duloxetine hcl], lodine [etodolac], and zoloft [sertraline hcl].  MEDICATIONS:  Current Outpatient Medications  Medication Sig Dispense Refill   ALPRAZolam (XANAX) 0.5 MG tablet 1/2 tablet to 1 tablet up to 3 times a day as needed for anxiety 90 tablet 1   dextromethorphan-guaiFENesin (ROBITUSSIN-DM) 10-100 MG/5ML liquid Take by mouth.     fluticasone (FLONASE) 50 MCG/ACT nasal spray Place into the nose.     ibuprofen (ADVIL,MOTRIN) 200 MG tablet Take 400 mg by mouth every 8 (eight) hours as needed (for pain.).     pravastatin (PRAVACHOL) 20 MG tablet Take 1 tablet (20 mg total) by mouth daily. 90 tablet 1   sodium chloride (OCEAN) 0.65 % nasal spray 1 spray as needed for Congestion     No current facility-administered medications for this visit.    REVIEW OF SYSTEMS:   Constitutional: ( - ) fevers, ( - )  chills , ( - ) night sweats Eyes: ( - ) blurriness of vision, ( - ) double vision, ( - ) watery eyes Ears, nose, mouth, throat, and face: ( - ) mucositis, ( - ) sore throat Respiratory: ( - ) cough, ( - ) dyspnea, ( - ) wheezes Cardiovascular: ( - ) palpitation, ( - ) chest discomfort, ( - ) lower extremity swelling Gastrointestinal:  ( - ) nausea, ( - )  heartburn, ( - ) change in bowel habits Skin: ( - ) abnormal skin rashes Lymphatics: ( - ) new lymphadenopathy, ( - ) easy bruising Neurological: ( - ) numbness, ( - ) tingling, ( - ) new weaknesses Behavioral/Psych: ( - ) mood change, ( - ) new changes  All other systems were reviewed with the patient and are negative.  PHYSICAL EXAMINATION: ECOG PERFORMANCE STATUS: 1 - Symptomatic but completely ambulatory  Vitals:   09/04/20 1100  BP: (!) 165/81  Pulse: 78  Resp: 18  Temp: 98.6 F (37 C)  SpO2: 100%    Filed Weights   09/04/20 1100  Weight: 215 lb 9.6 oz (97.8 kg)     GENERAL: Well-appearing elderly Caucasian male, alert, no distress and comfortable SKIN: skin color, texture, turgor are normal, no rashes or significant lesions EYES: conjunctiva are pink and non-injected, sclera  clear LUNGS: clear to auscultation and percussion with normal breathing effort HEART: regular rate & rhythm and no murmurs and no lower extremity edema Musculoskeletal: no cyanosis of digits and no clubbing  PSYCH: alert & oriented x 3, fluent speech NEURO: no focal motor/sensory deficits  LABORATORY DATA:  I have reviewed the data as listed CBC Latest Ref Rng & Units 09/04/2020 08/15/2020 07/29/2020  WBC 4.0 - 10.5 K/uL 8.6 8.5 8.2  Hemoglobin 13.0 - 17.0 g/dL 14.0 14.5 14.3  Hematocrit 39.0 - 52.0 % 39.9 42.5 41.7  Platelets 150 - 400 K/uL 309 324 356    CMP Latest Ref Rng & Units 09/04/2020 08/15/2020 07/29/2020  Glucose 70 - 99 mg/dL 105(H) 105(H) 98  BUN 8 - 23 mg/dL 11 9 7(L)  Creatinine 0.61 - 1.24 mg/dL 0.69 0.73 0.69  Sodium 135 - 145 mmol/L 134(L) 135 139  Potassium 3.5 - 5.1 mmol/L 4.2 4.0 4.0  Chloride 98 - 111 mmol/L 100 100 102  CO2 22 - 32 mmol/L '24 25 25  '$ Calcium 8.9 - 10.3 mg/dL 10.0 9.6 9.5  Total Protein 6.5 - 8.1 g/dL 7.6 7.7 7.8  Total Bilirubin 0.3 - 1.2 mg/dL 0.8 1.0 1.1  Alkaline Phos 38 - 126 U/L 109 114 120  AST 15 - 41 U/L 14(L) 14(L) 13(L)  ALT 0 - 44 U/L '15 9 9     '$ RADIOGRAPHIC STUDIES: XR Ankle Complete Right  Result Date: 09/03/2020 Three-view radiographs of the right ankle shows healed calcaneus fracture with subtalar arthrosis.  The ankle joint is congruent without osteochondral defect.   ASSESSMENT & PLAN Logan Alvarez 65 y.o. male with medical history significant for stage IV HCC who presents for a follow up visit.   After review of the labs, review of the records, and discussion with the patient the patients findings are most consistent with recurrent metastatic hepatocellular carcinoma. Previously I discussed the treatment options moving forward this patient.  It appears though these treatment options were already discussed with Dr. Leamon Arnt at [facility].  The options as I see it would be bevacizumab and atezolizumab, sorafenib, or lenvatinib.  After the risks and benefits of each were discussed the patient noted he would like to proceed with bevacizumab and atezolizumab.  EGD performed 07/30/2020, no evidence of varices Marcello Moores Score: A  # Hepatocellular Carcinoma, Stage IV --At this time I favor bevacizumab and atezolizumab q. 21 days.  Patient is considered stage IV due to peritoneal carcinomatosis of HCC. --EGD performed, no evidence of varices --Patient has recent imaging from June 2022.  We will plan to have imaging repeated q. 3 months. Next due Sept 2022.  --patient started Cycle 1 Day 1 on 08/15/2020.  --today is Cycle 2 Day 1 of treatment. There are no barriers to proceeding with treatment today.  --Return to clinic in 3 weeks for Cycle 3 Day 1   #Supportive Care -- chemotherapy education performed -- port placement offered, declined at this time -- No antiemetics required as this is not an emetogenic treatment -- no pain medication required at this time.   No orders of the defined types were placed in this encounter.   All questions were answered. The patient knows to call the clinic with any problems,  questions or concerns.  A total of more than 30 minutes were spent on this encounter with face-to-face time and non-face-to-face time, including preparing to see the patient, ordering tests and/or medications, counseling the patient and coordination of care as  outlined above.   Ledell Peoples, MD Department of Hematology/Oncology Drain at [facility] Phone: [phone] Pager: [phone] Email: [email or web address]  09/04/2020 4:33 PM

## 2020-09-03 NOTE — Progress Notes (Signed)
Office Visit Note   Patient: Logan Alvarez           Date of Birth: 01/09/1956           MRN: [id number] Visit Date: 09/02/2020              Requested by: Kathyrn Drown, MD [address] [place],  [place] [id number] PCP: Kathyrn Drown, MD  Chief Complaint  Patient presents with   Right Foot - Pain   Right Ankle - Pain      HPI: Patient is a 65 year old gentleman who is status post a calcaneus fracture.  Patient has been having increased pain in the subtalar joint for the past 3 to 4 months he complains of swelling pain with doing yard work pain on uneven terrain.  Assessment & Plan: Visit Diagnoses:  1. Pain in left foot   2. Pain in left ankle and joints of left foot   3. Pain in right ankle and joints of right foot   4. Arthritis of right subtalar joint     Plan: Patient is currently undergoing immunotherapy for liver cancer.  Discussed that when his immunotherapy stabilizes we could proceed with subtalar fusion and possible calcaneocuboid fusion.  In the meantime recommended Voltaren gel.  Patient states he does not want an injection at this time.  Follow-Up Instructions: Return if symptoms worsen or fail to improve.   Ortho Exam  Patient is alert, oriented, no adenopathy, well-dressed, normal affect, normal respiratory effort. Examination patient has good pulses he has swelling through the hindfoot the peroneal and posterior tibial tendons are nontender to palpation he has essentially no subtalar motion and with ambulation his hindfoot is in varus.  He has pain to palpation over the sinus Tarsi and this reproduces his symptoms.  Imaging: XR Ankle Complete Right  Result Date: 09/03/2020 Three-view radiographs of the right ankle shows healed calcaneus fracture with subtalar arthrosis.  The ankle joint is congruent without osteochondral defect.  No images are attached to the encounter.  Labs: Lab Results  Component Value Date   HGBA1C 5.1 05/25/2012    ESRSEDRATE 3 06/06/2019   ESRSEDRATE 5 05/17/2018   CRP 2 05/17/2018   LABURIC 6.4 09/20/2019     Lab Results  Component Value Date   ALBUMIN 3.6 08/15/2020   ALBUMIN 3.5 07/29/2020   ALBUMIN 4.4 06/04/2020    No results found for: MG No results found for: VD25OH  No results found for: PREALBUMIN CBC EXTENDED Latest Ref Rng & Units 08/15/2020 07/29/2020 06/04/2020  WBC 4.0 - 10.5 K/uL 8.5 8.2 10.1  RBC 4.22 - 5.81 MIL/uL 4.97 4.83 5.00  HGB 13.0 - 17.0 g/dL 14.5 14.3 14.7  HCT 39.0 - 52.0 % 42.5 41.7 44.2  PLT 150 - 400 K/uL 324 356 410  NEUTROABS 1.7 - 7.7 K/uL 6.0 5.7 7.1(H)  LYMPHSABS 0.7 - 4.0 K/uL 1.2 1.2 1.7     There is no height or weight on file to calculate BMI.  Orders:  Orders Placed This Encounter  Procedures   XR Foot Complete Right   XR Ankle Complete Right   No orders of the defined types were placed in this encounter.    Procedures: No procedures performed  Clinical Data: No additional findings.  ROS:  All other systems negative, except as noted in the HPI. Review of Systems  Objective: Vital Signs: There were no vitals taken for this visit.  Specialty Comments:  No specialty comments available.  PMFS History: Patient Active Problem List   Diagnosis Date Noted   Erectile dysfunction 11/11/2018   Hepatocellular carcinoma ([place]) 11/12/2017   Special screening for malignant neoplasms, colon    Closed intra-articular fracture of right calcaneus, sequela 03/03/2017   Arthritis of midtarsal joint of right foot 03/03/2017   Testosterone deficiency 05/27/2012   Chest pain 04/15/2011   HTN (hypertension) 04/15/2011   Mixed hyperlipidemia 04/15/2011   Anxiety 04/15/2011   Past Medical History:  Diagnosis Date   Cancer ([place]) 2020   liver tumor removed   Chest pain    a. 2008: Normal ETT;  b. 01/2013 CTA: Ca score of 715 (92%'ile), LAD 50p, D1 50-75, RCA 50p.   Hyperlipidemia    Hypertension    Testosterone deficiency 2009    Family  History  Problem Relation Age of Onset   Heart attack Father        69's    Past Surgical History:  Procedure Laterality Date   BALLOON DILATION  07/30/2020   Procedure: BALLOON DILATION;  Surgeon: Eloise Harman, DO;  [facility];  Service: Endoscopy;;   COLONOSCOPY N/A 03/12/2017   Procedure: COLONOSCOPY;  Surgeon: Danie Binder, MD;  [facility];  Service: Endoscopy;  Laterality: N/A;  2:00   COLONOSCOPY WITH PROPOFOL N/A 06/18/2020   Procedure: COLONOSCOPY WITH PROPOFOL;  Surgeon: Eloise Harman, DO;  [facility];  Service: Endoscopy;  Laterality: N/A;  pm appt   ESOPHAGOGASTRODUODENOSCOPY (EGD) WITH PROPOFOL N/A 07/30/2020   Procedure: ESOPHAGOGASTRODUODENOSCOPY (EGD) WITH PROPOFOL;  Surgeon: Eloise Harman, DO;  [facility];  Service: Endoscopy;  Laterality: N/A;  11:45am   LEFT HEART CATHETERIZATION WITH CORONARY ANGIOGRAM N/A 02/01/2013   Procedure: LEFT HEART CATHETERIZATION WITH CORONARY ANGIOGRAM;  Surgeon: Josue Hector, MD;  Location: [facility] CATH LAB;  Service: Cardiovascular;  Laterality: N/A;   NECK SURGERY     POLYPECTOMY  06/18/2020   Procedure: POLYPECTOMY;  Surgeon: Eloise Harman, DO;  [facility];  Service: Endoscopy;;  snare and biospy   Social History   Occupational History   Not on file  Tobacco Use   Smoking status: Never   Smokeless tobacco: Never  Vaping Use   Vaping Use: Never used  Substance and Sexual Activity   Alcohol use: Yes    Comment: 4-6 beers a day   Drug use: No   Sexual activity: Not on file

## 2020-09-04 ENCOUNTER — Inpatient Hospital Stay: Payer: BC Managed Care – PPO | Admitting: Dietician

## 2020-09-04 ENCOUNTER — Inpatient Hospital Stay: Payer: BC Managed Care – PPO | Attending: Hematology and Oncology

## 2020-09-04 ENCOUNTER — Inpatient Hospital Stay (HOSPITAL_BASED_OUTPATIENT_CLINIC_OR_DEPARTMENT_OTHER): Payer: BC Managed Care – PPO | Admitting: Hematology and Oncology

## 2020-09-04 ENCOUNTER — Other Ambulatory Visit: Payer: Self-pay | Admitting: *Deleted

## 2020-09-04 ENCOUNTER — Other Ambulatory Visit: Payer: Self-pay

## 2020-09-04 ENCOUNTER — Inpatient Hospital Stay: Payer: BC Managed Care – PPO

## 2020-09-04 VITALS — BP 182/95

## 2020-09-04 VITALS — BP 165/81 | HR 78 | Temp 98.6°F | Resp 18 | Wt 215.6 lb

## 2020-09-04 DIAGNOSIS — C220 Liver cell carcinoma: Secondary | ICD-10-CM

## 2020-09-04 DIAGNOSIS — Z79899 Other long term (current) drug therapy: Secondary | ICD-10-CM | POA: Diagnosis not present

## 2020-09-04 DIAGNOSIS — C786 Secondary malignant neoplasm of retroperitoneum and peritoneum: Secondary | ICD-10-CM | POA: Diagnosis not present

## 2020-09-04 DIAGNOSIS — E785 Hyperlipidemia, unspecified: Secondary | ICD-10-CM | POA: Insufficient documentation

## 2020-09-04 DIAGNOSIS — I10 Essential (primary) hypertension: Secondary | ICD-10-CM | POA: Diagnosis not present

## 2020-09-04 DIAGNOSIS — Z7189 Other specified counseling: Secondary | ICD-10-CM

## 2020-09-04 DIAGNOSIS — Z5112 Encounter for antineoplastic immunotherapy: Secondary | ICD-10-CM | POA: Diagnosis not present

## 2020-09-04 LAB — CBC WITH DIFFERENTIAL (CANCER CENTER ONLY)
Abs Immature Granulocytes: 0.04 10*3/uL (ref 0.00–0.07)
Basophils Absolute: 0.1 10*3/uL (ref 0.0–0.1)
Basophils Relative: 1 %
Eosinophils Absolute: 0.3 10*3/uL (ref 0.0–0.5)
Eosinophils Relative: 4 %
HCT: 39.9 % (ref 39.0–52.0)
Hemoglobin: 14 g/dL (ref 13.0–17.0)
Immature Granulocytes: 1 %
Lymphocytes Relative: 17 %
Lymphs Abs: 1.5 10*3/uL (ref 0.7–4.0)
MCH: 28.8 pg (ref 26.0–34.0)
MCHC: 35.1 g/dL (ref 30.0–36.0)
MCV: 82.1 fL (ref 80.0–100.0)
Monocytes Absolute: 1.1 10*3/uL — ABNORMAL HIGH (ref 0.1–1.0)
Monocytes Relative: 13 %
Neutro Abs: 5.6 10*3/uL (ref 1.7–7.7)
Neutrophils Relative %: 64 %
Platelet Count: 309 10*3/uL (ref 150–400)
RBC: 4.86 MIL/uL (ref 4.22–5.81)
RDW: 13.2 % (ref 11.5–15.5)
WBC Count: 8.6 10*3/uL (ref 4.0–10.5)
nRBC: 0 % (ref 0.0–0.2)

## 2020-09-04 LAB — CMP (CANCER CENTER ONLY)
ALT: 15 U/L (ref 0–44)
AST: 14 U/L — ABNORMAL LOW (ref 15–41)
Albumin: 3.5 g/dL (ref 3.5–5.0)
Alkaline Phosphatase: 109 U/L (ref 38–126)
Anion gap: 10 (ref 5–15)
BUN: 11 mg/dL (ref 8–23)
CO2: 24 mmol/L (ref 22–32)
Calcium: 10 mg/dL (ref 8.9–10.3)
Chloride: 100 mmol/L (ref 98–111)
Creatinine: 0.69 mg/dL (ref 0.61–1.24)
GFR, Estimated: >60 mL/min (ref >60)
Glucose, Bld: 105 mg/dL — ABNORMAL HIGH (ref 70–99)
Potassium: 4.2 mmol/L (ref 3.5–5.1)
Sodium: 134 mmol/L — ABNORMAL LOW (ref 135–145)
Total Bilirubin: 0.8 mg/dL (ref 0.3–1.2)
Total Protein: 7.6 g/dL (ref 6.5–8.1)

## 2020-09-04 LAB — TOTAL PROTEIN, URINE DIPSTICK: Protein, ur: NEGATIVE mg/dL

## 2020-09-04 LAB — LACTATE DEHYDROGENASE: LDH: 156 U/L (ref 98–192)

## 2020-09-04 LAB — CREATININE, URINE, RANDOM: Creatinine, Urine: 43.07 mg/dL

## 2020-09-04 MED ORDER — SODIUM CHLORIDE 0.9 % IV SOLN
Freq: Once | INTRAVENOUS | Status: AC
  Filled 2020-09-04: qty 250

## 2020-09-04 MED ORDER — SODIUM CHLORIDE 0.9 % IV SOLN
1200.0000 mg | Freq: Once | INTRAVENOUS | Status: AC
  Administered 2020-09-04: 1200 mg via INTRAVENOUS
  Filled 2020-09-04: qty 20

## 2020-09-04 MED ORDER — SODIUM CHLORIDE 0.9 % IV SOLN
15.0000 mg/kg | Freq: Once | INTRAVENOUS | Status: AC
  Administered 2020-09-04: 1500 mg via INTRAVENOUS
  Filled 2020-09-04: qty 48

## 2020-09-04 NOTE — Progress Notes (Signed)
Ok to treat with elevated BP per Dr.Dorsey

## 2020-09-04 NOTE — Progress Notes (Signed)
Nutrition Follow-up:  Patient with hepatocellular cancer. He is receiving atezolizumab and bevacizumab.    Met with patient in infusion. He reports good appetite and eating well. He has new taste for sweets. Patient eating 3 meals with snacks throughout the day. He eats breakfast at 7 (bowl of oatmeal with blueberries), has a breakfast bar at 8, and usually a few peanut butter crackers around 9. He usually eats a sandwich for lunch and a balanced dinner. Last night he had grilled chicken, zucchini and squash. He is drinking 3 quarts of water daily.Patient is drinking Ensure, but not daily. Patient denies nutrition impact symptoms.  Medications: reviewed  Labs: reviewed  Anthropometrics: Weight 215 lb 9.6 oz today stable. Patient weighed 215. 1 lbs on 7/14   NUTRITION DIAGNOSIS: Unintended weight loss stable   INTERVENTION:  Continue eating smaller meals more frequently and high calorie, high protein snacks for weight maintenance - pt has handout with recipes and snack ideas Recommended drinking 1 Ensure Plus/equivalent daily for added calories and protein Contact information provided   MONITORING, EVALUATION, GOAL: weight trends, intake   NEXT VISIT: To be scheduled as needed with treatment

## 2020-09-05 ENCOUNTER — Encounter: Payer: Self-pay | Admitting: Hematology and Oncology

## 2020-09-05 LAB — CEA (IN HOUSE-CHCC): CEA (CHCC-In House): 56.36 ng/mL — ABNORMAL HIGH (ref 0.00–5.00)

## 2020-09-05 LAB — CANCER ANTIGEN 19-9: CA 19-9: 9425 U/mL — ABNORMAL HIGH (ref 0–35)

## 2020-09-09 ENCOUNTER — Other Ambulatory Visit: Payer: Self-pay | Admitting: *Deleted

## 2020-09-09 ENCOUNTER — Telehealth: Payer: Self-pay | Admitting: *Deleted

## 2020-09-09 MED ORDER — AMLODIPINE BESYLATE 10 MG PO TABS: 10.0000 mg | ORAL_TABLET | Freq: Every day | ORAL | 2 refills | Status: DC

## 2020-09-09 NOTE — Telephone Encounter (Signed)
Received call from patient. He states that since his first chemo appt, his blood pressure has been elevated.  Highest was 177/109 yesterday morning. Today is was 146/99. Discussed with Dr. Lorenso Courier. He has ordered Amlodipine 10 mg daily. This was escribed to pt's pharmacy.  And pt was notifed. Pt is agreeable to this plan.

## 2020-09-23 ENCOUNTER — Inpatient Hospital Stay (HOSPITAL_BASED_OUTPATIENT_CLINIC_OR_DEPARTMENT_OTHER): Payer: BC Managed Care – PPO | Admitting: Hematology and Oncology

## 2020-09-23 ENCOUNTER — Other Ambulatory Visit: Payer: Self-pay

## 2020-09-23 ENCOUNTER — Other Ambulatory Visit: Payer: Self-pay | Admitting: *Deleted

## 2020-09-23 ENCOUNTER — Inpatient Hospital Stay: Payer: BC Managed Care – PPO

## 2020-09-23 ENCOUNTER — Telehealth: Payer: Self-pay | Admitting: Hematology and Oncology

## 2020-09-23 VITALS — BP 144/88 | HR 87 | Temp 97.6°F | Resp 18 | Ht 68.0 in | Wt 218.3 lb

## 2020-09-23 DIAGNOSIS — Z7189 Other specified counseling: Secondary | ICD-10-CM

## 2020-09-23 DIAGNOSIS — Z79899 Other long term (current) drug therapy: Secondary | ICD-10-CM | POA: Diagnosis not present

## 2020-09-23 DIAGNOSIS — Z5112 Encounter for antineoplastic immunotherapy: Secondary | ICD-10-CM | POA: Diagnosis not present

## 2020-09-23 DIAGNOSIS — C220 Liver cell carcinoma: Secondary | ICD-10-CM

## 2020-09-23 DIAGNOSIS — E785 Hyperlipidemia, unspecified: Secondary | ICD-10-CM | POA: Diagnosis not present

## 2020-09-23 DIAGNOSIS — C786 Secondary malignant neoplasm of retroperitoneum and peritoneum: Secondary | ICD-10-CM | POA: Diagnosis not present

## 2020-09-23 DIAGNOSIS — I10 Essential (primary) hypertension: Secondary | ICD-10-CM | POA: Diagnosis not present

## 2020-09-23 LAB — CMP (CANCER CENTER ONLY)
ALT: 16 U/L (ref 0–44)
AST: 14 U/L — ABNORMAL LOW (ref 15–41)
Albumin: 3.5 g/dL (ref 3.5–5.0)
Alkaline Phosphatase: 120 U/L (ref 38–126)
Anion gap: 10 (ref 5–15)
BUN: 11 mg/dL (ref 8–23)
CO2: 24 mmol/L (ref 22–32)
Calcium: 10.1 mg/dL (ref 8.9–10.3)
Chloride: 99 mmol/L (ref 98–111)
Creatinine: 0.76 mg/dL (ref 0.61–1.24)
GFR, Estimated: >60 mL/min (ref >60)
Glucose, Bld: 159 mg/dL — ABNORMAL HIGH (ref 70–99)
Potassium: 4.4 mmol/L (ref 3.5–5.1)
Sodium: 133 mmol/L — ABNORMAL LOW (ref 135–145)
Total Bilirubin: 0.9 mg/dL (ref 0.3–1.2)
Total Protein: 7.8 g/dL (ref 6.5–8.1)

## 2020-09-23 LAB — CBC WITH DIFFERENTIAL (CANCER CENTER ONLY)
Abs Immature Granulocytes: 0.02 10*3/uL (ref 0.00–0.07)
Basophils Absolute: 0.1 10*3/uL (ref 0.0–0.1)
Basophils Relative: 1 %
Eosinophils Absolute: 0.4 10*3/uL (ref 0.0–0.5)
Eosinophils Relative: 4 %
HCT: 40.6 % (ref 39.0–52.0)
Hemoglobin: 13.8 g/dL (ref 13.0–17.0)
Immature Granulocytes: 0 %
Lymphocytes Relative: 13 %
Lymphs Abs: 1.3 10*3/uL (ref 0.7–4.0)
MCH: 28.6 pg (ref 26.0–34.0)
MCHC: 34 g/dL (ref 30.0–36.0)
MCV: 84.1 fL (ref 80.0–100.0)
Monocytes Absolute: 0.9 10*3/uL (ref 0.1–1.0)
Monocytes Relative: 9 %
Neutro Abs: 6.9 10*3/uL (ref 1.7–7.7)
Neutrophils Relative %: 73 %
Platelet Count: 255 10*3/uL (ref 150–400)
RBC: 4.83 MIL/uL (ref 4.22–5.81)
RDW: 14 % (ref 11.5–15.5)
WBC Count: 9.5 10*3/uL (ref 4.0–10.5)
nRBC: 0 % (ref 0.0–0.2)

## 2020-09-23 LAB — PROTEIN / CREATININE RATIO, URINE
Creatinine, Urine: 95.81 mg/dL
Protein Creatinine Ratio: 0.32 mg/mg{Cre} — ABNORMAL HIGH (ref 0.00–0.15)
Total Protein, Urine: 31 mg/dL

## 2020-09-23 LAB — CEA (IN HOUSE-CHCC): CEA (CHCC-In House): 43.33 ng/mL — ABNORMAL HIGH (ref 0.00–5.00)

## 2020-09-23 LAB — LACTATE DEHYDROGENASE: LDH: 175 U/L (ref 98–192)

## 2020-09-23 MED ORDER — BEVACIZUMAB-AWWB CHEMO INJECTION 400 MG/16ML
15.0000 mg/kg | Freq: Once | INTRAVENOUS | Status: AC
  Administered 2020-09-23: 1500 mg via INTRAVENOUS
  Filled 2020-09-23: qty 12

## 2020-09-23 MED ORDER — SODIUM CHLORIDE 0.9 % IV SOLN
1200.0000 mg | Freq: Once | INTRAVENOUS | Status: AC
  Administered 2020-09-23: 1200 mg via INTRAVENOUS
  Filled 2020-09-23: qty 20

## 2020-09-23 MED ORDER — SODIUM CHLORIDE 0.9 % IV SOLN: Freq: Once | INTRAVENOUS | Status: AC

## 2020-09-23 NOTE — Progress Notes (Signed)
[place] [phone]   Fax:([phone]) [phone]  PROGRESS NOTE  Patient Care Team: Kathyrn Drown, MD as PCP - General (Family Medicine) Eloise Harman, DO as Consulting Physician (Internal Medicine) Orson Slick, MD as Consulting Physician (Hematology and Oncology)  Hematological/Oncological History # Hepatocellular Carcinoma, Stage IV 10/19/17: abdomen u/s 1.7 x 1.4 x 2.0 cm solid-appearing hepatic nodule with echogenic rim consistent suggesting a calcified rim 11/08/17: liver needle core biopsy. Hepatocellular carcinoma, poorly differentiated in a background of necrosis. Reviewed at [facility], HepPar1, [place] 3 12-08-2017: robotic right partial hepatectomy. Path: T1b HCC 06/25/2020: CT abdomen shows nodular studding of the ventral mesenteric fat, findings consistent with peritoneal involvement/carcinomatosis. 06/25/2020: US guided biopsy consistent with poorly differentiated Hepatocelluar carcinoma. CA 19-9 5238, CEA 43.3 07/16/2020: clinic visit with Dr. Leamon Arnt at [facility] Referred to [facility] for care closer to home 07/29/2020: establish care with Dr. Lorenso Courier 08/15/2020: Cycle 1 Day 1 of Atezolizumab/Bevacizumab 09/04/2020: Cycle 2 Day 1 of Atezolizumab/Bevacizumab 09/23/2020: Cycle 3 Day 1 of Atezolizumab/Bevacizumab  Interval History:  Logan Alvarez 65 y.o. male with medical history significant for stage IV HCC who presents for a follow up visit. The patient's last visit was on 09/04/2020 at which time he started Cycle 2 of atezo/bev. In the interim since the last visit he has had no major changes in his health.   On exam today Logan Alvarez reports he tolerated his last round well with the exception of some fatigue.  He also endorses having aches and pains in his joints.  He is taking ibuprofen in order to try to help with this.  He notes that he has been eating well and drinking plenty of water.  His appetite has remained good.  His weight  is currently up to 218 pounds, up 3 pounds from his last visit.  He thinks that he has been snacking a lot and not eating quite healthy.  His blood pressure has been improving happy to see this.  He denies any overt signs of bleeding or bruising.  He otherwise denies any fevers, chills, sweats, nausea, vomiting or diarrhea.  Denies any bleeding or bruising.  A full 10 point ROS is listed below.  MEDICAL HISTORY:  Past Medical History:  Diagnosis Date   Cancer ([place]) 2020   liver tumor removed   Chest pain    a. 2008: Normal ETT;  b. 01/2013 CTA: Ca score of 715 (92%'ile), LAD 50p, D1 50-75, RCA 50p.   Hyperlipidemia    Hypertension    Testosterone deficiency 2009    SURGICAL HISTORY: Past Surgical History:  Procedure Laterality Date   BALLOON DILATION  07/30/2020   Procedure: BALLOON DILATION;  Surgeon: Eloise Harman, DO;  [facility];  Service: Endoscopy;;   COLONOSCOPY N/A 03/12/2017   Procedure: COLONOSCOPY;  Surgeon: Danie Binder, MD;  [facility];  Service: Endoscopy;  Laterality: N/A;  2:00   COLONOSCOPY WITH PROPOFOL N/A 06/18/2020   Procedure: COLONOSCOPY WITH PROPOFOL;  Surgeon: Eloise Harman, DO;  [facility];  Service: Endoscopy;  Laterality: N/A;  pm appt   ESOPHAGOGASTRODUODENOSCOPY (EGD) WITH PROPOFOL N/A 07/30/2020   Procedure: ESOPHAGOGASTRODUODENOSCOPY (EGD) WITH PROPOFOL;  Surgeon: Eloise Harman, DO;  [facility];  Service: Endoscopy;  Laterality: N/A;  11:45am   LEFT HEART CATHETERIZATION WITH CORONARY ANGIOGRAM N/A 02/01/2013   Procedure: LEFT HEART CATHETERIZATION WITH CORONARY ANGIOGRAM;  Surgeon: Josue Hector, MD;  Location: [facility];  Service: Cardiovascular;  Laterality: N/A;   NECK SURGERY     POLYPECTOMY  06/18/2020   Procedure: POLYPECTOMY;  Surgeon: Eloise Harman, DO;  Location: [facility];  Service: Endoscopy;;  snare and biospy    SOCIAL HISTORY: Social History   Socioeconomic History    Marital status: Widowed    Spouse name: Not on file   Number of children: Not on file   Years of education: Not on file   Highest education level: Not on file  Occupational History   Not on file  Tobacco Use   Smoking status: Never   Smokeless tobacco: Never  Vaping Use   Vaping Use: Never used  Substance and Sexual Activity   Alcohol use: Yes    Comment: 4-6 beers a day   Drug use: No   Sexual activity: Not on file  Other Topics Concern   Not on file  Social History Narrative   Not on file   Social Determinants of Health   Financial Resource Strain: Not on file  Food Insecurity: Not on file  Transportation Needs: Not on file  Physical Activity: Not on file  Stress: Not on file  Social Connections: Not on file  Intimate Partner Violence: Not on file    FAMILY HISTORY: Family History  Problem Relation Age of Onset   Heart attack Father        76's    ALLERGIES:  is allergic to cymbalta [duloxetine hcl], lodine [etodolac], and zoloft [sertraline hcl].  MEDICATIONS:  Current Outpatient Medications  Medication Sig Dispense Refill   ALPRAZolam (XANAX) 0.5 MG tablet 1/2 tablet to 1 tablet up to 3 times a day as needed for anxiety 90 tablet 1   amLODipine (NORVASC) 10 MG tablet Take 1 tablet (10 mg total) by mouth daily. 30 tablet 2   dextromethorphan-guaiFENesin (ROBITUSSIN-DM) 10-100 MG/5ML liquid Take by mouth.     fluticasone (FLONASE) 50 MCG/ACT nasal spray Place into the nose.     ibuprofen (ADVIL,MOTRIN) 200 MG tablet Take 400 mg by mouth every 8 (eight) hours as needed (for pain.).     pravastatin (PRAVACHOL) 20 MG tablet Take 1 tablet (20 mg total) by mouth daily. 90 tablet 1   sodium chloride (OCEAN) 0.65 % nasal spray 1 spray as needed for Congestion     No current facility-administered medications for this visit.    REVIEW OF SYSTEMS:   Constitutional: ( - ) fevers, ( - )  chills , ( - ) night sweats Eyes: ( - ) blurriness of vision, ( - ) double  vision, ( - ) watery eyes Ears, nose, mouth, throat, and face: ( - ) mucositis, ( - ) sore throat Respiratory: ( - ) cough, ( - ) dyspnea, ( - ) wheezes Cardiovascular: ( - ) palpitation, ( - ) chest discomfort, ( - ) lower extremity swelling Gastrointestinal:  ( - ) nausea, ( - ) heartburn, ( - ) change in bowel habits Skin: ( - ) abnormal skin rashes Lymphatics: ( - ) new lymphadenopathy, ( - ) easy bruising Neurological: ( - ) numbness, ( - ) tingling, ( - ) new weaknesses Behavioral/Psych: ( - ) mood change, ( - ) new changes  All other systems were reviewed with the patient and are negative.  PHYSICAL EXAMINATION: ECOG PERFORMANCE STATUS: 1 - Symptomatic but completely ambulatory  Vitals:   09/23/20 1053  BP: (!) 144/88  Pulse: 87  Resp: 18  Temp: 97.6  F (36.4 C)  SpO2: 100%    Filed Weights   09/23/20 1053  Weight: 218 lb 4.8 oz (99 kg)     GENERAL: Well-appearing elderly Caucasian male, alert, no distress and comfortable SKIN: skin color, texture, turgor are normal, no rashes or significant lesions EYES: conjunctiva are pink and non-injected, sclera clear LUNGS: clear to auscultation and percussion with normal breathing effort HEART: regular rate & rhythm and no murmurs and no lower extremity edema Musculoskeletal: no cyanosis of digits and no clubbing  PSYCH: alert & oriented x 3, fluent speech NEURO: no focal motor/sensory deficits  LABORATORY DATA:  I have reviewed the data as listed CBC Latest Ref Rng & Units 09/23/2020 09/04/2020 08/15/2020  WBC 4.0 - 10.5 K/uL 9.5 8.6 8.5  Hemoglobin 13.0 - 17.0 g/dL 13.8 14.0 14.5  Hematocrit 39.0 - 52.0 % 40.6 39.9 42.5  Platelets 150 - 400 K/uL 255 309 324    CMP Latest Ref Rng & Units 09/23/2020 09/04/2020 08/15/2020  Glucose 70 - 99 mg/dL 159(H) 105(H) 105(H)  BUN 8 - 23 mg/dL '11 11 9  '$ Creatinine 0.61 - 1.24 mg/dL 0.76 0.69 0.73  Sodium 135 - 145 mmol/L 133(L) 134(L) 135  Potassium 3.5 - 5.1 mmol/L 4.4 4.2 4.0   Chloride 98 - 111 mmol/L 99 100 100  CO2 22 - 32 mmol/L '24 24 25  '$ Calcium 8.9 - 10.3 mg/dL 10.1 10.0 9.6  Total Protein 6.5 - 8.1 g/dL 7.8 7.6 7.7  Total Bilirubin 0.3 - 1.2 mg/dL 0.9 0.8 1.0  Alkaline Phos 38 - 126 U/L 120 109 114  AST 15 - 41 U/L 14(L) 14(L) 14(L)  ALT 0 - 44 U/L '16 15 9    '$ RADIOGRAPHIC STUDIES: XR Ankle Complete Right  Result Date: 09/03/2020 Three-view radiographs of the right ankle shows healed calcaneus fracture with subtalar arthrosis.  The ankle joint is congruent without osteochondral defect.   ASSESSMENT & PLAN Logan Alvarez 65 y.o. male with medical history significant for stage IV HCC who presents for a follow up visit.   After review of the labs, review of the records, and discussion with the patient the patients findings are most consistent with recurrent metastatic hepatocellular carcinoma. Previously I discussed the treatment options moving forward this patient.  It appears though these treatment options were already discussed with Dr. Leamon Arnt at [facility].  The options as I see it would be bevacizumab and atezolizumab, sorafenib, or lenvatinib.  After the risks and benefits of each were discussed the patient noted he would like to proceed with bevacizumab and atezolizumab.  EGD performed 07/30/2020, no evidence of varices Marcello Moores Score: A  # Hepatocellular Carcinoma, Stage IV --At this time I favor bevacizumab and atezolizumab q. 21 days.  Patient is considered stage IV due to peritoneal carcinomatosis of HCC. --EGD performed, no evidence of varices --Patient has recent imaging from June 2022.  We will plan to have imaging repeated q. 3 months. Next due Sept 2022.  --patient started Cycle 1 Day 1 on 08/15/2020.  --today is Cycle 3 Day 1 of treatment. There are no barriers to proceeding with treatment today.  --Return to clinic in 3 weeks for Cycle 4 Day 1   #Supportive Care -- chemotherapy education performed -- port placement  offered, declined at this time -- No antiemetics required as this is not an emetogenic treatment -- no pain medication required at this time.   No orders of the defined types were placed in this encounter.  All questions were answered. The patient knows to call the clinic with any problems, questions or concerns.  A total of more than 30 minutes were spent on this encounter with face-to-face time and non-face-to-face time, including preparing to see the patient, ordering tests and/or medications, counseling the patient and coordination of care as outlined above.   Ledell Peoples, MD Department of Hematology/Oncology [place] at [facility] Phone: [phone] Pager: [phone] Email: [email or web address]  09/23/2020 11:13 AM

## 2020-09-23 NOTE — Telephone Encounter (Signed)
Scheduled per 08/22 los, patient will be updated per My chart of upcoming appointments.

## 2020-09-23 NOTE — Progress Notes (Signed)
Urine protein pending.  Per MD, ok to continue with treatment without results.

## 2020-09-23 NOTE — Patient Instructions (Signed)
[facility]  Discharge Instructions: Thank you for choosing [facility] to provide your oncology and hematology care.   If you have a lab appointment with the [facility], please go directly to the [facility] and check in at the registration area.   Wear comfortable clothing and clothing appropriate for easy access to any Portacath or PICC line.   We strive to give you quality time with your provider. You may need to reschedule your appointment if you arrive late (15 or more minutes).  Arriving late affects you and other patients whose appointments are after yours.  Also, if you miss three or more appointments without notifying the office, you may be dismissed from the clinic at the provider's discretion.      For prescription refill requests, have your pharmacy contact our office and allow 72 hours for refills to be completed.    Today you received the following chemotherapy and/or immunotherapy agents Avastin and tecentriq.      To help prevent nausea and vomiting after your treatment, we encourage you to take your nausea medication as directed.  BELOW ARE SYMPTOMS THAT SHOULD BE REPORTED IMMEDIATELY: *FEVER GREATER THAN 100.4 F (38 C) OR HIGHER *CHILLS OR SWEATING *NAUSEA AND VOMITING THAT IS NOT CONTROLLED WITH YOUR NAUSEA MEDICATION *UNUSUAL SHORTNESS OF BREATH *UNUSUAL BRUISING OR BLEEDING *URINARY PROBLEMS (pain or burning when urinating, or frequent urination) *BOWEL PROBLEMS (unusual diarrhea, constipation, pain near the anus) TENDERNESS IN MOUTH AND THROAT WITH OR WITHOUT PRESENCE OF ULCERS (sore throat, sores in mouth, or a toothache) UNUSUAL RASH, SWELLING OR PAIN  UNUSUAL VAGINAL DISCHARGE OR ITCHING   Items with * indicate a potential emergency and should be followed up as soon as possible or go to the Emergency Department if any problems should occur.  Please show the CHEMOTHERAPY ALERT CARD or IMMUNOTHERAPY ALERT CARD at  check-in to the Emergency Department and triage nurse.  Should you have questions after your visit or need to cancel or reschedule your appointment, please contact [facility]  Dept: [phone]  and follow the prompts.  Office hours are 8:00 a.m. to 4:30 p.m. Monday - Friday. Please note that voicemails left after 4:00 p.m. may not be returned until the following business day.  We are closed weekends and major holidays. You have access to a nurse at all times for urgent questions. Please call the main number to the clinic Dept: [phone] and follow the prompts.   For any non-urgent questions, you may also contact your provider using MyChart. We now offer e-Visits for anyone 37 and older to request care online for non-urgent symptoms. For details visit [email or web address].   Also download the MyChart app! Go to the app store, search "MyChart", open the app, select [facility], and log in with your MyChart username and password.  Due to Covid, a mask is required upon entering the hospital/clinic. If you do not have a mask, one will be given to you upon arrival. For doctor visits, patients may have 1 support person aged 49 or older with them. For treatment visits, patients cannot have anyone with them due to current Covid guidelines and our immunocompromised population.

## 2020-09-24 ENCOUNTER — Ambulatory Visit (INDEPENDENT_AMBULATORY_CARE_PROVIDER_SITE_OTHER): Payer: BC Managed Care – PPO | Admitting: Family Medicine

## 2020-09-24 VITALS — BP 150/86 | Wt 220.4 lb

## 2020-09-24 DIAGNOSIS — I10 Essential (primary) hypertension: Secondary | ICD-10-CM | POA: Diagnosis not present

## 2020-09-24 LAB — CANCER ANTIGEN 19-9: CA 19-9: 7102 U/mL — ABNORMAL HIGH (ref 0–35)

## 2020-09-24 MED ORDER — ALPRAZOLAM 0.5 MG PO TABS: ORAL_TABLET | ORAL | 5 refills | Status: DC

## 2020-09-24 MED ORDER — LOSARTAN POTASSIUM 25 MG PO TABS: 25.0000 mg | ORAL_TABLET | Freq: Every day | ORAL | 1 refills | Status: DC

## 2020-09-24 NOTE — Progress Notes (Signed)
   Subjective:    Patient ID: Logan Alvarez, male    DOB: [birth year], 65 y.o.   MRN: [id number]  HPI  Patient arrives to discuss elevated blood pressure since cancer returned.  He relates his blood pressure has been intermittently elevated He checks his blood pressure on a regular basis He brings his cuff in in order to have it checked Denies any chest tightness pressure pain shortness of breath Occasionally gets a headache with the blood pressure Oncologist started patient on Norvasc 10,g daily- it has come down some since starting med  Review of Systems     Objective:   Physical Exam  General-in no acute distress Eyes-no discharge Lungs-respiratory rate normal, CTA CV-no murmurs,RRR Extremities skin warm dry no edema Neuro grossly normal Behavior normal, alert       Assessment & Plan:  Blood pressure still elevated Add losartan 25 mg daily Side effects discussed Follow-up if progressive troubles Recheck again in 4 weeks Send [place] readings Will monitor kidney function through what they draw at his oncologist Getting in some walking and healthy eating discussed

## 2020-09-24 NOTE — Patient Instructions (Signed)
Your blood pressure is higher than goals  Ideally we would like to see your top number in the 120s to low 130s And your bottom number in the mid 70s to low 80s  Please check your blood pressure periodically over the course of the next few weeks Feel free to send me an update every 7 to 14 days [place] if you are having any questions  Always check your blood pressure in a seated position, feet on the floor, do the blood pressure after 5 minutes of sitting, wait 1 minute then repeat the blood pressure, take the best reading.  We are adding a medication called losartan 25 mg which is the lowest dose, 1 daily, continue Amlodipine 10 mg daily Follow-up here in approximately 4 to 6 weeks  We will notify Dr. Lorenso Courier of this addition If you feel you are having any problems with the medicine please let us know Thanks-Dr. Nicki Reaper

## 2020-09-25 ENCOUNTER — Ambulatory Visit: Payer: BC Managed Care – PPO | Admitting: Hematology and Oncology

## 2020-09-25 ENCOUNTER — Ambulatory Visit: Payer: BC Managed Care – PPO

## 2020-09-25 ENCOUNTER — Other Ambulatory Visit: Payer: BC Managed Care – PPO

## 2020-10-04 ENCOUNTER — Other Ambulatory Visit: Payer: Self-pay

## 2020-10-04 ENCOUNTER — Encounter (HOSPITAL_COMMUNITY): Payer: Self-pay

## 2020-10-04 ENCOUNTER — Ambulatory Visit (HOSPITAL_COMMUNITY)
Admission: RE | Admit: 2020-10-04 | Discharge: 2020-10-04 | Disposition: A | Discharge status: Home / Self Care | Payer: BC Managed Care – PPO | Source: Ambulatory Visit | Attending: Hematology and Oncology | Admitting: Hematology and Oncology

## 2020-10-04 DIAGNOSIS — D1800 Hemangioma unspecified site: Secondary | ICD-10-CM | POA: Diagnosis not present

## 2020-10-04 DIAGNOSIS — J984 Other disorders of lung: Secondary | ICD-10-CM | POA: Diagnosis not present

## 2020-10-04 DIAGNOSIS — C220 Liver cell carcinoma: Secondary | ICD-10-CM

## 2020-10-04 DIAGNOSIS — I2510 Atherosclerotic heart disease of native coronary artery without angina pectoris: Secondary | ICD-10-CM | POA: Diagnosis not present

## 2020-10-04 DIAGNOSIS — M47814 Spondylosis without myelopathy or radiculopathy, thoracic region: Secondary | ICD-10-CM | POA: Diagnosis not present

## 2020-10-04 DIAGNOSIS — K7689 Other specified diseases of liver: Secondary | ICD-10-CM | POA: Diagnosis not present

## 2020-10-04 MED ORDER — IOHEXOL 350 MG/ML SOLN
80.0000 mL | Freq: Once | INTRAVENOUS | Status: AC | PRN
  Administered 2020-10-04: 80 mL via INTRAVENOUS

## 2020-10-05 ENCOUNTER — Encounter: Payer: Self-pay | Admitting: Family Medicine

## 2020-10-08 MED ORDER — AMLODIPINE BESYLATE 10 MG PO TABS
10.0000 mg | ORAL_TABLET | Freq: Every day | ORAL | 1 refills | Status: DC
Start: 2020-10-08 — End: 2020-12-04

## 2020-10-08 NOTE — Addendum Note (Signed)
Addended by: Vicente Males on: 10/08/2020 09:12 AM   Modules accepted: Orders

## 2020-10-08 NOTE — Telephone Encounter (Signed)
Nurses-he can keep the current regimen.  Please send in 6 months worth of refills on amlodipine 10 mg 1 daily Patient has follow-up visit later in September we will recheck blood pressure at that time thank you

## 2020-10-14 ENCOUNTER — Inpatient Hospital Stay (HOSPITAL_BASED_OUTPATIENT_CLINIC_OR_DEPARTMENT_OTHER): Payer: BC Managed Care – PPO | Admitting: Hematology and Oncology

## 2020-10-14 ENCOUNTER — Encounter: Payer: Self-pay | Admitting: Internal Medicine

## 2020-10-14 ENCOUNTER — Other Ambulatory Visit: Payer: Self-pay | Admitting: *Deleted

## 2020-10-14 ENCOUNTER — Inpatient Hospital Stay: Payer: BC Managed Care – PPO

## 2020-10-14 ENCOUNTER — Inpatient Hospital Stay: Payer: BC Managed Care – PPO | Attending: Hematology and Oncology

## 2020-10-14 ENCOUNTER — Other Ambulatory Visit: Payer: Self-pay

## 2020-10-14 VITALS — BP 130/74 | HR 85 | Temp 98.3°F | Resp 18 | Ht 68.0 in | Wt 217.5 lb

## 2020-10-14 DIAGNOSIS — Z79899 Other long term (current) drug therapy: Secondary | ICD-10-CM | POA: Insufficient documentation

## 2020-10-14 DIAGNOSIS — I10 Essential (primary) hypertension: Secondary | ICD-10-CM | POA: Diagnosis not present

## 2020-10-14 DIAGNOSIS — C786 Secondary malignant neoplasm of retroperitoneum and peritoneum: Secondary | ICD-10-CM | POA: Diagnosis not present

## 2020-10-14 DIAGNOSIS — C220 Liver cell carcinoma: Secondary | ICD-10-CM

## 2020-10-14 DIAGNOSIS — Z5112 Encounter for antineoplastic immunotherapy: Secondary | ICD-10-CM | POA: Diagnosis not present

## 2020-10-14 DIAGNOSIS — E785 Hyperlipidemia, unspecified: Secondary | ICD-10-CM | POA: Diagnosis not present

## 2020-10-14 LAB — CBC WITH DIFFERENTIAL (CANCER CENTER ONLY)
Abs Immature Granulocytes: 0.04 10*3/uL (ref 0.00–0.07)
Basophils Absolute: 0 10*3/uL (ref 0.0–0.1)
Basophils Relative: 0 %
Eosinophils Absolute: 0.2 10*3/uL (ref 0.0–0.5)
Eosinophils Relative: 2 %
HCT: 40.1 % (ref 39.0–52.0)
Hemoglobin: 13.9 g/dL (ref 13.0–17.0)
Immature Granulocytes: 0 %
Lymphocytes Relative: 13 %
Lymphs Abs: 1.2 10*3/uL (ref 0.7–4.0)
MCH: 29.1 pg (ref 26.0–34.0)
MCHC: 34.7 g/dL (ref 30.0–36.0)
MCV: 84.1 fL (ref 80.0–100.0)
Monocytes Absolute: 1 10*3/uL (ref 0.1–1.0)
Monocytes Relative: 10 %
Neutro Abs: 7.1 10*3/uL (ref 1.7–7.7)
Neutrophils Relative %: 75 %
Platelet Count: 324 10*3/uL (ref 150–400)
RBC: 4.77 MIL/uL (ref 4.22–5.81)
RDW: 14.4 % (ref 11.5–15.5)
WBC Count: 9.5 10*3/uL (ref 4.0–10.5)
nRBC: 0 % (ref 0.0–0.2)

## 2020-10-14 LAB — PROTEIN / CREATININE RATIO, URINE
Creatinine, Urine: 49.01 mg/dL
Total Protein, Urine: <6 mg/dL

## 2020-10-14 LAB — CMP (CANCER CENTER ONLY)
ALT: 13 U/L (ref 0–44)
AST: 15 U/L (ref 15–41)
Albumin: 3.7 g/dL (ref 3.5–5.0)
Alkaline Phosphatase: 109 U/L (ref 38–126)
Anion gap: 9 (ref 5–15)
BUN: 11 mg/dL (ref 8–23)
CO2: 24 mmol/L (ref 22–32)
Calcium: 9.8 mg/dL (ref 8.9–10.3)
Chloride: 100 mmol/L (ref 98–111)
Creatinine: 0.74 mg/dL (ref 0.61–1.24)
GFR, Estimated: >60 mL/min (ref >60)
Glucose, Bld: 121 mg/dL — ABNORMAL HIGH (ref 70–99)
Potassium: 4.1 mmol/L (ref 3.5–5.1)
Sodium: 133 mmol/L — ABNORMAL LOW (ref 135–145)
Total Bilirubin: 1 mg/dL (ref 0.3–1.2)
Total Protein: 8 g/dL (ref 6.5–8.1)

## 2020-10-14 LAB — LACTATE DEHYDROGENASE: LDH: 220 U/L — ABNORMAL HIGH (ref 98–192)

## 2020-10-14 LAB — CEA (IN HOUSE-CHCC): CEA (CHCC-In House): 30.81 ng/mL — ABNORMAL HIGH (ref 0.00–5.00)

## 2020-10-14 MED ORDER — SODIUM CHLORIDE 0.9 % IV SOLN
15.0000 mg/kg | Freq: Once | INTRAVENOUS | Status: AC
  Administered 2020-10-14: 1500 mg via INTRAVENOUS
  Filled 2020-10-14: qty 48

## 2020-10-14 MED ORDER — SODIUM CHLORIDE 0.9 % IV SOLN
1200.0000 mg | Freq: Once | INTRAVENOUS | Status: AC
  Administered 2020-10-14: 1200 mg via INTRAVENOUS
  Filled 2020-10-14: qty 20

## 2020-10-14 MED ORDER — SODIUM CHLORIDE 0.9 % IV SOLN: Freq: Once | INTRAVENOUS | Status: AC

## 2020-10-14 NOTE — Progress Notes (Signed)
[place] [phone]   [phone]  PROGRESS NOTE  Patient Care Team: Kathyrn Drown, MD as PCP - General (Family Medicine) Eloise Harman, DO as Consulting Physician (Internal Medicine) Orson Slick, MD as Consulting Physician (Hematology and Oncology)  Hematological/Oncological History # Hepatocellular Carcinoma, Stage IV 10/19/17: abdomen u/s 1.7 x 1.4 x 2.0 cm solid-appearing hepatic nodule with echogenic rim consistent suggesting a calcified rim 11/08/17: liver needle core biopsy. Hepatocellular carcinoma, poorly differentiated in a background of necrosis. Reviewed at [place] [facility], HepPar1, [place] 3 12-08-2017: robotic right partial hepatectomy. Path: T1b HCC 06/25/2020: CT abdomen shows nodular studding of the ventral mesenteric fat, findings consistent with peritoneal involvement/carcinomatosis. 06/25/2020: US guided biopsy consistent with poorly differentiated Hepatocelluar carcinoma. CA 19-9 5238, CEA 43.3 07/16/2020: clinic visit with Dr. Leamon Arnt at [facility]. Referred to [facility] for care closer to home 07/29/2020: establish care with Dr. Lorenso Courier 08/15/2020: Cycle 1 Day 1 of Atezolizumab/Bevacizumab 09/04/2020: Cycle 2 Day 1 of Atezolizumab/Bevacizumab 09/23/2020: Cycle 3 Day 1 of Atezolizumab/Bevacizumab 10/15/2020: Cycle 4 Day 1 of Atezolizumab/Bevacizumab  Interval History:  Logan Alvarez 65 y.o. male with medical history significant for stage IV [place] who presents for a follow up visit. The patient's last visit was on 09/23/2020 at which time he started Cycle 3 of atezo/bev. In the interim since the last visit he has had no major changes in his health.   On exam today Logan Alvarez reports that he has been having intermittent periods of fatigue while on therapy.  He has been having muscle aches particularly in his shoulders and feels like a few days after treatment he has "no energy".  He notes he has had to take off work early  several days in order to sit in his recliner.  He notes that this happens every treatment.  He notes that there is little symptoms to note on the day of treatment, however they have taken on about day 3 or 4.  He notes that his appetite has been good but his weight has dropped slightly down to 217 pounds from 220 pounds at his last visit.  He denies any overt signs of bleeding or bruising.  He otherwise denies any fevers, chills, sweats, nausea, vomiting or diarrhea.  Denies any bleeding or bruising.  A full 10 point ROS is listed below.  MEDICAL HISTORY:  Past Medical History:  Diagnosis Date   Cancer ([place]) 2020   liver tumor removed   Chest pain    a. 2008: Normal ETT;  b. 01/2013 CTA: Ca score of 715 (92%'ile), LAD 50p, D1 50-75, RCA 50p.   Hyperlipidemia    Hypertension    Testosterone deficiency 2009    SURGICAL HISTORY: Past Surgical History:  Procedure Laterality Date   BALLOON DILATION  07/30/2020   Procedure: BALLOON DILATION;  Surgeon: Eloise Harman, DO;  [facility];  Service: Endoscopy;;   COLONOSCOPY N/A 03/12/2017   Procedure: COLONOSCOPY;  Surgeon: Danie Binder, MD;  [facility];  Service: Endoscopy;  Laterality: N/A;  2:00   COLONOSCOPY WITH PROPOFOL N/A 06/18/2020   Procedure: COLONOSCOPY WITH PROPOFOL;  Surgeon: Eloise Harman, DO;  [facility];  Service: Endoscopy;  Laterality: N/A;  pm appt   ESOPHAGOGASTRODUODENOSCOPY (EGD) WITH PROPOFOL N/A 07/30/2020   Procedure: ESOPHAGOGASTRODUODENOSCOPY (EGD) WITH PROPOFOL;  Surgeon: Eloise Harman, DO;  [facility];  Service: Endoscopy;  Laterality: N/A;  11:45am  LEFT HEART CATHETERIZATION WITH CORONARY ANGIOGRAM N/A 02/01/2013   Procedure: LEFT HEART CATHETERIZATION WITH CORONARY ANGIOGRAM;  Surgeon: Josue Hector, MD;  Location: [facility] CATH LAB;  Service: Cardiovascular;  Laterality: N/A;   NECK SURGERY     POLYPECTOMY  06/18/2020   Procedure: POLYPECTOMY;  Surgeon:  Eloise Harman, DO;  Location: [facility];  Service: Endoscopy;;  snare and biospy    SOCIAL HISTORY: Social History   Socioeconomic History   Marital status: Widowed    Spouse name: Not on file   Number of children: Not on file   Years of education: Not on file   Highest education level: Not on file  Occupational History   Not on file  Tobacco Use   Smoking status: Never   Smokeless tobacco: Never  Vaping Use   Vaping Use: Never used  Substance and Sexual Activity   Alcohol use: Yes    Comment: 4-6 beers a day   Drug use: No   Sexual activity: Not on file  Other Topics Concern   Not on file  Social History Narrative   Not on file   Social Determinants of Health   Financial Resource Strain: Not on file  Food Insecurity: Not on file  Transportation Needs: Not on file  Physical Activity: Not on file  Stress: Not on file  Social Connections: Not on file  Intimate Partner Violence: Not on file    FAMILY HISTORY: Family History  Problem Relation Age of Onset   Heart attack Father        59's    ALLERGIES:  is allergic to cymbalta [duloxetine hcl], lodine [etodolac], and zoloft [sertraline hcl].  MEDICATIONS:  Current Outpatient Medications  Medication Sig Dispense Refill   ALPRAZolam (XANAX) 0.5 MG tablet 1/2 tablet to 1 tablet up to 3 times a day as needed for anxiety 90 tablet 5   amLODipine (NORVASC) 10 MG tablet Take 1 tablet (10 mg total) by mouth daily. 90 tablet 1   dextromethorphan-guaiFENesin (ROBITUSSIN-DM) 10-100 MG/5ML liquid Take by mouth.     fluticasone (FLONASE) 50 MCG/ACT nasal spray Place into the nose.     ibuprofen (ADVIL,MOTRIN) 200 MG tablet Take 400 mg by mouth every 8 (eight) hours as needed (for pain.).     losartan (COZAAR) 25 MG tablet Take 1 tablet (25 mg total) by mouth daily. 90 tablet 1   pravastatin (PRAVACHOL) 20 MG tablet Take 1 tablet (20 mg total) by mouth daily. 90 tablet 1   sodium chloride (OCEAN) 0.65 % nasal spray 1  spray as needed for Congestion     No current facility-administered medications for this visit.    REVIEW OF SYSTEMS:   Constitutional: ( - ) fevers, ( - )  chills , ( - ) night sweats Eyes: ( - ) blurriness of vision, ( - ) double vision, ( - ) watery eyes Ears, nose, mouth, throat, and face: ( - ) mucositis, ( - ) sore throat Respiratory: ( - ) cough, ( - ) dyspnea, ( - ) wheezes Cardiovascular: ( - ) palpitation, ( - ) chest discomfort, ( - ) lower extremity swelling Gastrointestinal:  ( - ) nausea, ( - ) heartburn, ( - ) change in bowel habits Skin: ( - ) abnormal skin rashes Lymphatics: ( - ) new lymphadenopathy, ( - ) easy bruising Neurological: ( - ) numbness, ( - ) tingling, ( - ) new weaknesses Behavioral/Psych: ( - ) mood change, ( - ) new changes  All other systems were reviewed with the patient and are negative.  PHYSICAL EXAMINATION: ECOG PERFORMANCE STATUS: 1 - Symptomatic but completely ambulatory  Vitals:   10/14/20 1131  BP: 130/74  Pulse: 85  Resp: 18  Temp: 98.3 F (36.8 C)  SpO2: 100%    Filed Weights   10/14/20 1131  Weight: 217 lb 8 oz (98.7 kg)   GENERAL: Well-appearing elderly Caucasian male, alert, no distress and comfortable SKIN: skin color, texture, turgor are normal, no rashes or significant lesions EYES: conjunctiva are pink and non-injected, sclera clear LUNGS: clear to auscultation and percussion with normal breathing effort HEART: regular rate & rhythm and no murmurs and no lower extremity edema Musculoskeletal: no cyanosis of digits and no clubbing  PSYCH: alert & oriented x 3, fluent speech NEURO: no focal motor/sensory deficits  LABORATORY DATA:  I have reviewed the data as listed CBC Latest Ref Rng & Units 10/14/2020 09/23/2020 09/04/2020  WBC 4.0 - 10.5 K/uL 9.5 9.5 8.6  Hemoglobin 13.0 - 17.0 g/dL 13.9 13.8 14.0  Hematocrit 39.0 - 52.0 % 40.1 40.6 39.9  Platelets 150 - 400 K/uL 324 255 309    CMP Latest Ref Rng & Units 10/14/2020  09/23/2020 09/04/2020  Glucose 70 - 99 mg/dL 121(H) 159(H) 105(H)  BUN 8 - 23 mg/dL '11 11 11  '$ Creatinine 0.61 - 1.24 mg/dL 0.74 0.76 0.69  Sodium 135 - 145 mmol/L 133(L) 133(L) 134(L)  Potassium 3.5 - 5.1 mmol/L 4.1 4.4 4.2  Chloride 98 - 111 mmol/L 100 99 100  CO2 22 - 32 mmol/L '24 24 24  '$ Calcium 8.9 - 10.3 mg/dL 9.8 10.1 10.0  Total Protein 6.5 - 8.1 g/dL 8.0 7.8 7.6  Total Bilirubin 0.3 - 1.2 mg/dL 1.0 0.9 0.8  Alkaline Phos 38 - 126 U/L 109 120 109  AST 15 - 41 U/L 15 14(L) 14(L)  ALT 0 - 44 U/L '13 16 15    '$ RADIOGRAPHIC STUDIES: CT CHEST ABDOMEN PELVIS W CONTRAST  Result Date: 10/07/2020 CLINICAL DATA:  Hepatocellular carcinoma status post liver tumor surgery and immunotherapy. Assess treatment response. EXAM: CT CHEST, ABDOMEN, AND PELVIS WITH CONTRAST TECHNIQUE: Multidetector CT imaging of the chest, abdomen and pelvis was performed following the standard protocol during bolus administration of intravenous contrast. CONTRAST:  27m OMNIPAQUE IOHEXOL 350 MG/ML SOLN COMPARISON:  Chest CT 11/18/2017. [facility] abdomen [facility] The report only from an outside MRI performed 06/05/2020 is also correlated. FINDINGS: CT CHEST FINDINGS Cardiovascular: Extensive three-vessel coronary artery atherosclerosis with lesser involvement of the aorta and great vessels. No acute vascular findings. The heart size is normal. There is no pericardial effusion. Mediastinum/Nodes: There are no enlarged mediastinal, hilar or axillary lymph nodes. The thyroid gland, trachea and esophagus demonstrate no significant findings. Lungs/Pleura: There is no pleural effusion. Unchanged small left-sided pulmonary nodules, largest 4 mm in the left lower lobe on image 111/5. No new or enlarging pulmonary nodules. Musculoskeletal/Chest wall: No chest wall mass or suspicious osseous findings. Previous lower cervical fusion. Mild degenerative changes in the thoracic spine with scattered small  hemangiomas. CT ABDOMEN AND PELVIS FINDINGS Hepatobiliary: Stable postsurgical changes consistent with partial right hepatectomy. The liver contours are irregular, consistent with underlying cirrhosis. Scattered T2 hyperintense cysts are unchanged. The ill-defined low-density lesion posteriorly in the right lobe seen on the outside CT appears smaller, measuring approximately 7 mm on image 61/3 (previously 15 mm). No new or enlarging liver lesions identified. No significant biliary dilatation  status post cholecystectomy. Pancreas: The pancreas appears stable, with ill-defined low-density in the pancreatic tail. No focal mass lesion, ductal dilatation or surrounding inflammation. Spleen: Normal in size without focal abnormality. Adrenals/Urinary Tract: Both adrenal glands appear normal. Stable small cyst in the lower pole of the right kidney. No evidence of enhancing renal mass, urinary tract calculus or hydronephrosis. The bladder appears unremarkable. Stomach/Bowel: Enteric contrast was administered and has passed into the right colon. The stomach appears unremarkable for its degree of distension. No evidence of bowel wall thickening, distention or surrounding inflammatory change. The appendix appears normal. Moderate stool throughout the colon. Vascular/Lymphatic: Stable small lymph nodes in the porta hepatis. No retroperitoneal lymphadenopathy. Aortic and branch vessel atherosclerosis without aneurysm or acute vascular findings. The portal, superior mesenteric and splenic veins are patent. Reproductive: The prostate gland appears unremarkable. Other: A subcutaneous nodule lateral to the right hepatic lobe measures 1.4 x 1.1 cm on image 60/3. This appears unchanged from the most recent study. Diffuse omental caking is again demonstrated consistent with peritoneal carcinomatosis. Overall, this appears little changed from the previous study, with the largest component anteriorly in the right mid abdomen measuring up  to 3.8 cm on coronal image 28/6. No ascites. Musculoskeletal: No acute or significant osseous findings. Stable chronic L1 compression deformity and mild lumbar spondylosis. IMPRESSION: 1. Stable chest CT without evidence of thoracic metastatic disease. 2. Little change in diffuse omental nodularity consistent with peritoneal carcinomatosis compared with outside CT of 06/25/2020. No significant ascites. 3. Little change in subcutaneous nodule within the right upper abdominal wall lateral to the liver, potentially a metastasis in the surgical bed as previously questioned. 4. No suspicious hepatic findings identified. There are scattered small cysts. The ill-defined low-density lesion posteriorly in the right lobe on the previous study appears smaller and may reflect treated disease. 5. Stable appearance of the pancreas. 6. Coronary and Aortic Atherosclerosis (ICD10-I70.0). Electronically Signed   By: Richardean Sale M.D.   On: 10/07/2020 12:13    ASSESSMENT & PLAN Logan Alvarez 65 y.o. male with medical history significant for stage IV [place] who presents for a follow up visit.   After review of the labs, review of the records, and discussion with the patient the patients findings are most consistent with recurrent metastatic hepatocellular carcinoma. Previously I discussed the treatment options moving forward this patient.  It appears though these treatment options were already discussed with Dr. Leamon [facility] at [facility].  The options as I see it would be bevacizumab and atezolizumab, sorafenib, or lenvatinib.  After the risks and benefits of each were discussed the patient noted he would like to proceed with bevacizumab and atezolizumab.  EGD performed 07/30/2020, no evidence of varices Marcello Moores Score: A  # Hepatocellular Carcinoma, Stage IV --At this time I favor bevacizumab and atezolizumab q. 21 days.  Patient is considered stage IV due to peritoneal carcinomatosis of HCC. --EGD performed, no  evidence of varices --Patient has recent imaging from Sept 2022, shows stable disease.  We will plan to have imaging repeated q. 3 months. Next due Dec 2022.  --patient started Cycle 1 Day 1 on 08/15/2020.  --tumor markers downtrending. CA 19-9 at 5400, CEA at 30 --today is Cycle 4 Day 1 of treatment. There are no barriers to proceeding with treatment today.  --Return to clinic in 3 weeks for Cycle 5 Day 1   #Supportive Care -- chemotherapy education performed -- port placement offered, declined at this time -- No antiemetics required  as this is not an emetogenic treatment -- no pain medication required at this time.   No orders of the defined types were placed in this encounter.   All questions were answered. The patient knows to call the clinic with any problems, questions or concerns.  A total of more than 30 minutes were spent on this encounter with face-to-face time and non-face-to-face time, including preparing to see the patient, ordering tests and/or medications, counseling the patient and coordination of care as outlined above.   Ledell Peoples, MD Department of Hematology/Oncology [place] [facility] Phone: [phone] Pager: [phone] Email: [email or web address]  10/14/2020 12:10 PM

## 2020-10-14 NOTE — Patient Instructions (Signed)
[facility] ONCOL[facility]  Discharge Instructions: Thank you for choosing [facility] to provide your oncology and hematology care.   If you have a lab appointment with the [facility], please go directly to the [facility] and check in at the registration area.   Wear comfortable clothing and clothing appropriate for easy access to any Portacath or PICC line.   We strive to give you quality time with your provider. You may need to reschedule your appointment if you arrive late (15 or more minutes).  Arriving late affects you and other patients whose appointments are after yours.  Also, if you miss three or more appointments without notifying the office, you may be dismissed from the clinic at the provider's discretion.      For prescription refill requests, have your pharmacy contact our office and allow 72 hours for refills to be completed.    Today you received the following chemotherapy and/or immunotherapy agents Bevacizumab and Atezolizumab      To help prevent nausea and vomiting after your treatment, we encourage you to take your nausea medication as directed.  BELOW ARE SYMPTOMS THAT SHOULD BE REPORTED IMMEDIATELY: *FEVER GREATER THAN 100.4 F (38 C) OR HIGHER *CHILLS OR SWEATING *NAUSEA AND VOMITING THAT IS NOT CONTROLLED WITH YOUR NAUSEA MEDICATION *UNUSUAL SHORTNESS OF BREATH *UNUSUAL BRUISING OR BLEEDING *URINARY PROBLEMS (pain or burning when urinating, or frequent urination) *BOWEL PROBLEMS (unusual diarrhea, constipation, pain near the anus) TENDERNESS IN MOUTH AND THROAT WITH OR WITHOUT PRESENCE OF ULCERS (sore throat, sores in mouth, or a toothache) UNUSUAL RASH, SWELLING OR PAIN  UNUSUAL VAGINAL DISCHARGE OR ITCHING   Items with * indicate a potential emergency and should be followed up as soon as possible or go to the Emergency Department if any problems should occur.  Please show the CHEMOTHERAPY ALERT CARD or IMMUNOTHERAPY ALERT  CARD at check-in to the Emergency Department and triage nurse.  Should you have questions after your visit or need to cancel or reschedule your appointment, please contact [facility]  Dept: [phone]  and follow the prompts.  Office hours are 8:00 a.m. to 4:30 p.m. Monday - Friday. Please note that voicemails left after 4:00 p.m. may not be returned until the following business day.  We are closed weekends and major holidays. You have access to a nurse at all times for urgent questions. Please call the main number to the clinic Dept: [phone] and follow the prompts.   For any non-urgent questions, you may also contact your provider using MyChart. We now offer e-Visits for anyone 48 and older to request care online for non-urgent symptoms. For details visit [email or web address].   Also download the MyChart app! Go to the app store, search "MyChart", open the app, select [facility], and log in with your MyChart username and password.  Due to Covid, a mask is required upon entering the hospital/clinic. If you do not have a mask, one will be given to you upon arrival. For doctor visits, patients may have 1 support person aged 71 or older with them. For treatment visits, patients cannot have anyone with them due to current Covid guidelines and our immunocompromised population.

## 2020-10-15 ENCOUNTER — Encounter: Payer: Self-pay | Admitting: Hematology and Oncology

## 2020-10-15 ENCOUNTER — Telehealth: Payer: Self-pay

## 2020-10-15 LAB — CANCER ANTIGEN 19-9: CA 19-9: 5456 U/mL — ABNORMAL HIGH (ref 0–35)

## 2020-10-15 NOTE — Telephone Encounter (Signed)
-----   Message from Orson Slick, MD sent at 10/15/2020  9:07 AM EDT ----- Please call Logan Alvarez to let him know the tumor markers for his Liver Cancer are dropping. His CA 19-9 is down to 5400 (from 9400) and his CEA has dropped to 30 (from 56). This would suggesting his immunotherapy is working well. We will continue to see him every 3 weeks for treatment.  ----- Message ----- From: Interface, Lab In [place] Sent: 10/14/2020  11:23 AM EDT To: Orson Slick, MD

## 2020-10-15 NOTE — Telephone Encounter (Signed)
Patient was made aware of his lab results and verbalized understanding.

## 2020-10-21 ENCOUNTER — Ambulatory Visit (INDEPENDENT_AMBULATORY_CARE_PROVIDER_SITE_OTHER): Payer: BC Managed Care – PPO | Admitting: Family Medicine

## 2020-10-21 ENCOUNTER — Encounter: Payer: Self-pay | Admitting: Family Medicine

## 2020-10-21 ENCOUNTER — Other Ambulatory Visit: Payer: Self-pay

## 2020-10-21 VITALS — BP 144/86 | HR 89 | Temp 97.0°F | Wt 221.6 lb

## 2020-10-21 DIAGNOSIS — M791 Myalgia, unspecified site: Secondary | ICD-10-CM | POA: Diagnosis not present

## 2020-10-21 DIAGNOSIS — I10 Essential (primary) hypertension: Secondary | ICD-10-CM

## 2020-10-21 MED ORDER — LOSARTAN POTASSIUM 50 MG PO TABS
50.0000 mg | ORAL_TABLET | Freq: Every day | ORAL | 1 refills | Status: DC
Start: 2020-10-21 — End: 2020-12-13

## 2020-10-21 NOTE — Progress Notes (Signed)
   Subjective:    Patient ID: Logan Alvarez, male    DOB: [birth year], 65 y.o.   MRN: [id number]  HPI Pt here for follow up on HTN. Pt states that since being on med, BP has come down but is not at target range yet. Pt not having symptoms of HTN. Checks blood pressure at home on a daily basis.   Intermittently blood pressure is elevated he checks it multiple times per week sometimes several times per day. Review of Systems     Objective:   Physical Exam General-in no acute distress Eyes-no discharge Lungs-respiratory rate normal, CTA CV-no murmurs,RRR Extremities skin warm dry no edema Neuro grossly normal Behavior normal, alert        Assessment & Plan:  Several of his blood pressure readings are moderately elevated at home as well as here at the office  Given this we will go ahead with bumping up the dose of the losartan patient agrees he will monitor blood pressures he will send [place] readings he will follow-up in 4 to 6 weeks  Patient having a lot of body aches with his cancer treatments currently taking ibuprofen and seems that it helps to some degree.  Given his symptoms currently taking ibuprofen and Tylenol seems to help some  We will touch base with oncology to see if they have any other suggestions ideally try to avoid narcotics because patient on benzodiazepine

## 2020-10-30 ENCOUNTER — Encounter: Payer: Self-pay | Admitting: Hematology and Oncology

## 2020-10-30 ENCOUNTER — Encounter: Payer: Self-pay | Admitting: Family Medicine

## 2020-11-04 ENCOUNTER — Other Ambulatory Visit: Payer: Self-pay

## 2020-11-04 ENCOUNTER — Encounter: Payer: Self-pay | Admitting: Hematology and Oncology

## 2020-11-04 ENCOUNTER — Inpatient Hospital Stay (HOSPITAL_BASED_OUTPATIENT_CLINIC_OR_DEPARTMENT_OTHER): Payer: BC Managed Care – PPO | Admitting: Hematology and Oncology

## 2020-11-04 ENCOUNTER — Inpatient Hospital Stay: Payer: BC Managed Care – PPO | Attending: Hematology and Oncology

## 2020-11-04 ENCOUNTER — Inpatient Hospital Stay: Payer: BC Managed Care – PPO

## 2020-11-04 VITALS — BP 157/77 | HR 87 | Temp 97.9°F | Resp 17 | Ht 68.0 in | Wt 221.8 lb

## 2020-11-04 DIAGNOSIS — Z5112 Encounter for antineoplastic immunotherapy: Secondary | ICD-10-CM | POA: Insufficient documentation

## 2020-11-04 DIAGNOSIS — R978 Other abnormal tumor markers: Secondary | ICD-10-CM | POA: Insufficient documentation

## 2020-11-04 DIAGNOSIS — Z79899 Other long term (current) drug therapy: Secondary | ICD-10-CM | POA: Insufficient documentation

## 2020-11-04 DIAGNOSIS — C220 Liver cell carcinoma: Secondary | ICD-10-CM | POA: Diagnosis not present

## 2020-11-04 DIAGNOSIS — C786 Secondary malignant neoplasm of retroperitoneum and peritoneum: Secondary | ICD-10-CM | POA: Insufficient documentation

## 2020-11-04 DIAGNOSIS — Z7189 Other specified counseling: Secondary | ICD-10-CM

## 2020-11-04 DIAGNOSIS — R970 Elevated carcinoembryonic antigen [CEA]: Secondary | ICD-10-CM | POA: Diagnosis not present

## 2020-11-04 LAB — CMP (CANCER CENTER ONLY)
ALT: 15 U/L (ref 0–44)
AST: 15 U/L (ref 15–41)
Albumin: 3.6 g/dL (ref 3.5–5.0)
Alkaline Phosphatase: 113 U/L (ref 38–126)
Anion gap: 9 (ref 5–15)
BUN: 10 mg/dL (ref 8–23)
CO2: 25 mmol/L (ref 22–32)
Calcium: 9.4 mg/dL (ref 8.9–10.3)
Chloride: 96 mmol/L — ABNORMAL LOW (ref 98–111)
Creatinine: 0.69 mg/dL (ref 0.61–1.24)
GFR, Estimated: >60 mL/min
Glucose, Bld: 128 mg/dL — ABNORMAL HIGH (ref 70–99)
Potassium: 4.3 mmol/L (ref 3.5–5.1)
Sodium: 130 mmol/L — ABNORMAL LOW (ref 135–145)
Total Bilirubin: 1.1 mg/dL (ref 0.3–1.2)
Total Protein: 7.6 g/dL (ref 6.5–8.1)

## 2020-11-04 LAB — CBC WITH DIFFERENTIAL (CANCER CENTER ONLY)
Abs Immature Granulocytes: 0.03 K/uL (ref 0.00–0.07)
Basophils Absolute: 0 K/uL (ref 0.0–0.1)
Basophils Relative: 0 %
Eosinophils Absolute: 0.2 K/uL (ref 0.0–0.5)
Eosinophils Relative: 2 %
HCT: 38.6 % — ABNORMAL LOW (ref 39.0–52.0)
Hemoglobin: 13.7 g/dL (ref 13.0–17.0)
Immature Granulocytes: 0 %
Lymphocytes Relative: 11 %
Lymphs Abs: 1.1 K/uL (ref 0.7–4.0)
MCH: 29.6 pg (ref 26.0–34.0)
MCHC: 35.5 g/dL (ref 30.0–36.0)
MCV: 83.4 fL (ref 80.0–100.0)
Monocytes Absolute: 1.2 K/uL — ABNORMAL HIGH (ref 0.1–1.0)
Monocytes Relative: 12 %
Neutro Abs: 7.2 K/uL (ref 1.7–7.7)
Neutrophils Relative %: 75 %
Platelet Count: 288 K/uL (ref 150–400)
RBC: 4.63 MIL/uL (ref 4.22–5.81)
RDW: 14.7 % (ref 11.5–15.5)
WBC Count: 9.8 K/uL (ref 4.0–10.5)
nRBC: 0 % (ref 0.0–0.2)

## 2020-11-04 LAB — PROTEIN / CREATININE RATIO, URINE
Creatinine, Urine: 69.86 mg/dL
Total Protein, Urine: <6 mg/dL

## 2020-11-04 LAB — CEA (IN HOUSE-CHCC): CEA (CHCC-In House): 35.72 ng/mL — ABNORMAL HIGH (ref 0.00–5.00)

## 2020-11-04 LAB — LACTATE DEHYDROGENASE: LDH: 206 U/L — ABNORMAL HIGH (ref 98–192)

## 2020-11-04 MED ORDER — GABAPENTIN 300 MG PO CAPS: 300.0000 mg | ORAL_CAPSULE | Freq: Every day | ORAL | 2 refills | Status: DC

## 2020-11-04 MED ORDER — SODIUM CHLORIDE 0.9 % IV SOLN: Freq: Once | INTRAVENOUS | Status: AC

## 2020-11-04 MED ORDER — SODIUM CHLORIDE 0.9 % IV SOLN
1200.0000 mg | Freq: Once | INTRAVENOUS | Status: AC
  Administered 2020-11-04: 1200 mg via INTRAVENOUS
  Filled 2020-11-04: qty 20

## 2020-11-04 MED ORDER — SODIUM CHLORIDE 0.9 % IV SOLN
15.0000 mg/kg | Freq: Once | INTRAVENOUS | Status: AC
  Administered 2020-11-04: 1500 mg via INTRAVENOUS
  Filled 2020-11-04: qty 48

## 2020-11-04 NOTE — Progress Notes (Signed)
[place] [phone]   [phone]  PROGRESS NOTE  Patient Care Team: Kathyrn Drown, MD as PCP - General (Family Medicine) Eloise Harman, DO as Consulting Physician (Internal Medicine) Orson Slick, MD as Consulting Physician (Hematology and Oncology)  Hematological/Oncological History # Hepatocellular Carcinoma, Stage IV 10/19/17: abdomen u/s 1.7 x 1.4 x 2.0 cm solid-appearing hepatic nodule with echogenic rim consistent suggesting a calcified rim 11/08/17: liver needle core biopsy. Hepatocellular carcinoma, poorly differentiated in a background of necrosis. Reviewed at [place] [facility], HepPar1, [place] 3 12-08-2017: robotic right partial hepatectomy. Path: T1b HCC 06/25/2020: CT abdomen shows nodular studding of the ventral mesenteric fat, findings consistent with peritoneal involvement/carcinomatosis. 06/25/2020: US guided biopsy consistent with poorly differentiated Hepatocelluar carcinoma. CA 19-9 5238, CEA 43.3 07/16/2020: clinic visit with Dr. Leamon Arnt at [facility]. Referred to [facility] for care closer to home 07/29/2020: establish care with Dr. Lorenso Courier 08/15/2020: Cycle 1 Day 1 of Atezolizumab/Bevacizumab 09/04/2020: Cycle 2 Day 1 of Atezolizumab/Bevacizumab 09/23/2020: Cycle 3 Day 1 of Atezolizumab/Bevacizumab 10/15/2020: Cycle 4 Day 1 of Atezolizumab/Bevacizumab 11/04/2020: Cycle 5 Day 1 of Atezolizumab/Bevacizumab  Interval History:  Logan Alvarez 65 y.o. male with medical history significant for stage IV [place] who presents for a follow up visit. The patient's last visit was on 10/15/2020 at which time he started Cycle 4 of atezo/bev. In the interim since the last visit he has had no major changes in his health.   On exam today Logan Alvarez reports that the major issue he has been having with his treatment so far has been pain in his arms.  He notes that it is a "soreness in his shoulders" bilaterally.  He notes that some days it is  worse than others and he has difficulty lifting his arms.  He reports that this were in his legs he would not be able to walk.  He fortunately has been without other side effects.  He denies any overt signs of bleeding or bruising.  He otherwise denies any fevers, chills, sweats, nausea, vomiting or diarrhea.  Denies any bleeding or bruising.  A full 10 point ROS is listed below.  MEDICAL HISTORY:  Past Medical History:  Diagnosis Date   Cancer ([place]) 2020   liver tumor removed   Chest pain    a. 2008: Normal ETT;  b. 01/2013 CTA: Ca score of 715 (92%'ile), LAD 50p, D1 50-75, RCA 50p.   Hyperlipidemia    Hypertension    Testosterone deficiency 2009    SURGICAL HISTORY: Past Surgical History:  Procedure Laterality Date   BALLOON DILATION  07/30/2020   Procedure: BALLOON DILATION;  Surgeon: Eloise Harman, DO;  [facility];  Service: Endoscopy;;   COLONOSCOPY N/A 03/12/2017   Procedure: COLONOSCOPY;  Surgeon: Danie Binder, MD;  [facility];  Service: Endoscopy;  Laterality: N/A;  2:00   COLONOSCOPY WITH PROPOFOL N/A 06/18/2020   Procedure: COLONOSCOPY WITH PROPOFOL;  Surgeon: Eloise Harman, DO;  [facility];  Service: Endoscopy;  Laterality: N/A;  pm appt   ESOPHAGOGASTRODUODENOSCOPY (EGD) WITH PROPOFOL N/A 07/30/2020   Procedure: ESOPHAGOGASTRODUODENOSCOPY (EGD) WITH PROPOFOL;  Surgeon: Eloise Harman, DO;  [facility];  Service: Endoscopy;  Laterality: N/A;  11:45am   LEFT HEART CATHETERIZATION WITH CORONARY ANGIOGRAM N/A 02/01/2013   Procedure: LEFT HEART CATHETERIZATION WITH CORONARY ANGIOGRAM;  Surgeon: Josue Hector, MD;  Location: [facility] CATH LAB;  Service: Cardiovascular;  Laterality:  N/A;   NECK SURGERY     POLYPECTOMY  06/18/2020   Procedure: POLYPECTOMY;  Surgeon: Eloise Harman, DO;  Location[facility];  Service: Endoscopy;;  snare and biospy    SOCIAL HISTORY: Social History   Socioeconomic History   Marital  status: Widowed    Spouse name: Not on file   Number of children: Not on file   Years of education: Not on file   Highest education level: Not on file  Occupational History   Not on file  Tobacco Use   Smoking status: Never   Smokeless tobacco: Never  Vaping Use   Vaping Use: Never used  Substance and Sexual Activity   Alcohol use: Yes    Comment: 4-6 beers a day   Drug use: No   Sexual activity: Not on file  Other Topics Concern   Not on file  Social History Narrative   Not on file   Social Determinants of Health   Financial Resource Strain: Not on file  Food Insecurity: Not on file  Transportation Needs: Not on file  Physical Activity: Not on file  Stress: Not on file  Social Connections: Not on file  Intimate Partner Violence: Not on file    FAMILY HISTORY: Family History  Problem Relation Age of Onset   Heart attack Father        39's    ALLERGIES:  is allergic to cymbalta [duloxetine hcl], lodine [etodolac], and zoloft [sertraline hcl].  MEDICATIONS:  Current Outpatient Medications  Medication Sig Dispense Refill   gabapentin (NEURONTIN) 300 MG capsule Take 1 capsule (300 mg total) by mouth at bedtime. 30 capsule 2   ALPRAZolam (XANAX) 0.5 MG tablet 1/2 tablet to 1 tablet up to 3 times a day as needed for anxiety 90 tablet 5   amLODipine (NORVASC) 10 MG tablet Take 1 tablet (10 mg total) by mouth daily. 90 tablet 1   dextromethorphan-guaiFENesin (ROBITUSSIN-DM) 10-100 MG/5ML liquid Take by mouth.     fluticasone (FLONASE) 50 MCG/ACT nasal spray Place into the nose.     ibuprofen (ADVIL,MOTRIN) 200 MG tablet Take 400 mg by mouth every 8 (eight) hours as needed (for pain.).     losartan (COZAAR) 50 MG tablet Take 1 tablet (50 mg total) by mouth daily. 90 tablet 1   pravastatin (PRAVACHOL) 20 MG tablet Take 1 tablet (20 mg total) by mouth daily. 90 tablet 1   sodium chloride (OCEAN) 0.65 % nasal spray 1 spray as needed for Congestion     No current  facility-administered medications for this visit.    REVIEW OF SYSTEMS:   Constitutional: ( - ) fevers, ( - )  chills , ( - ) night sweats Eyes: ( - ) blurriness of vision, ( - ) double vision, ( - ) watery eyes Ears, nose, mouth, throat, and face: ( - ) mucositis, ( - ) sore throat Respiratory: ( - ) cough, ( - ) dyspnea, ( - ) wheezes Cardiovascular: ( - ) palpitation, ( - ) chest discomfort, ( - ) lower extremity swelling Gastrointestinal:  ( - ) nausea, ( - ) heartburn, ( - ) change in bowel habits Skin: ( - ) abnormal skin rashes Lymphatics: ( - ) new lymphadenopathy, ( - ) easy bruising Neurological: ( - ) numbness, ( - ) tingling, ( - ) new weaknesses Behavioral/Psych: ( - ) mood change, ( - ) new changes  All other systems were reviewed with the patient and are negative.  PHYSICAL EXAMINATION: ECOG PERFORMANCE STATUS: 1 - Symptomatic but completely ambulatory  Vitals:   11/04/20 1136  BP: (!) 157/77  Pulse: 87  Resp: 17  Temp: 97.9 F (36.6 C)  SpO2: 100%    Filed Weights   11/04/20 1136  Weight: 221 lb 12.8 oz (100.6 kg)   GENERAL: Well-appearing elderly Caucasian male, alert, no distress and comfortable SKIN: skin color, texture, turgor are normal, no rashes or significant lesions EYES: conjunctiva are pink and non-injected, sclera clear LUNGS: clear to auscultation and percussion with normal breathing effort HEART: regular rate & rhythm and no murmurs and no lower extremity edema Musculoskeletal: no cyanosis of digits and no clubbing  PSYCH: alert & oriented x 3, fluent speech NEURO: no focal motor/sensory deficits  LABORATORY DATA:  I have reviewed the data as listed CBC Latest Ref Rng & Units 11/04/2020 10/14/2020 09/23/2020  WBC 4.0 - 10.5 K/uL 9.8 9.5 9.5  Hemoglobin 13.0 - 17.0 g/dL 13.7 13.9 13.8  Hematocrit 39.0 - 52.0 % 38.6(L) 40.1 40.6  Platelets 150 - 400 K/uL 288 324 255    CMP Latest Ref Rng & Units 11/04/2020 10/14/2020 09/23/2020  Glucose 70 -  99 mg/dL 128(H) 121(H) 159(H)  BUN 8 - 23 mg/dL 10 11 11   Creatinine 0.61 - 1.24 mg/dL 0.69 0.74 0.76  Sodium 135 - 145 mmol/L 130(L) 133(L) 133(L)  Potassium 3.5 - 5.1 mmol/L 4.3 4.1 4.4  Chloride 98 - 111 mmol/L 96(L) 100 99  CO2 22 - 32 mmol/L 25 24 24   Calcium 8.9 - 10.3 mg/dL 9.4 9.8 10.1  Total Protein 6.5 - 8.1 g/dL 7.6 8.0 7.8  Total Bilirubin 0.3 - 1.2 mg/dL 1.1 1.0 0.9  Alkaline Phos 38 - 126 U/L 113 109 120  AST 15 - 41 U/L 15 15 14(L)  ALT 0 - 44 U/L 15 13 16     RADIOGRAPHIC STUDIES: No results found.  ASSESSMENT & PLAN Logan Alvarez 65 y.o. male with medical history significant for stage IV HCC who presents for a follow up visit.   After review of the labs, review of the records, and discussion with the patient the patients findings are most consistent with recurrent metastatic hepatocellular carcinoma. Previously I discussed the treatment options moving forward this patient.  It appears though these treatment options were already discussed with Dr. Leamon Arnt at [facility].  The options as I see it would be bevacizumab and atezolizumab, sorafenib, or lenvatinib.  After the risks and benefits of each were discussed the patient noted he would like to proceed with bevacizumab and atezolizumab.  EGD performed 07/30/2020, no evidence of varices Marcello Moores Score: A  # Hepatocellular Carcinoma, Stage IV --At this time I favor bevacizumab and atezolizumab q. 21 days.  Patient is considered stage IV due to peritoneal carcinomatosis of HCC. --EGD performed, no evidence of varices --Patient has recent imaging from Sept 2022, shows stable disease.  We will plan to have imaging repeated q. 3 months. Next due Dec 2022.  --patient started Cycle 1 Day 1 on 08/15/2020.  --tumor markers downtrending. CA 19-9 at 5400, CEA at 30 --today is Cycle 5 Day 1 of treatment. There are no barriers to proceeding with treatment today.  --Return to clinic in 3 weeks for Cycle 6 Day  1  #Muscle Soreness --Can be a side effect of both bevacizumab and atezolizumab  -- We will prescribe gabapentin 300 mg p.o. nightly --Continue to monitor   #Supportive Care -- chemotherapy education performed --  port placement offered, declined at this time -- No antiemetics required as this is not an emetogenic treatment -- no pain medication required at this time.   No orders of the defined types were placed in this encounter.   All questions were answered. The patient knows to call the clinic with any problems, questions or concerns.  A total of more than 30 minutes were spent on this encounter with face-to-face time and non-face-to-face time, including preparing to see the patient, ordering tests and/or medications, counseling the patient and coordination of care as outlined above.   Logan Peoples, MD Department of Hematology/Oncology [place] at [facility] Phone: [phone] Pager: [phone] Email: [email or web address]  11/04/2020 12:40 PM

## 2020-11-04 NOTE — Patient Instructions (Signed)
[facility] ONCOLOGY  Discharge Instructions: Thank you for choosing [facility] to provide your oncology and hematology care.   If you have a lab appointment with the [facility], please go directly to the [facility] and check in at the registration area.   Wear comfortable clothing and clothing appropriate for easy access to any Portacath or PICC line.   We strive to give you quality time with your provider. You may need to reschedule your appointment if you arrive late (15 or more minutes).  Arriving late affects you and other patients whose appointments are after yours.  Also, if you miss three or more appointments without notifying the office, you may be dismissed from the clinic at the provider's discretion.      For prescription refill requests, have your pharmacy contact our office and allow 72 hours for refills to be completed.    Today you received the following chemotherapy and/or immunotherapy agents: Tecentriq & MVASI     To help prevent nausea and vomiting after your treatment, we encourage you to take your nausea medication as directed.  BELOW ARE SYMPTOMS THAT SHOULD BE REPORTED IMMEDIATELY: *FEVER GREATER THAN 100.4 F (38 C) OR HIGHER *CHILLS OR SWEATING *NAUSEA AND VOMITING THAT IS NOT CONTROLLED WITH YOUR NAUSEA MEDICATION *UNUSUAL SHORTNESS OF BREATH *UNUSUAL BRUISING OR BLEEDING *URINARY PROBLEMS (pain or burning when urinating, or frequent urination) *BOWEL PROBLEMS (unusual diarrhea, constipation, pain near the anus) TENDERNESS IN MOUTH AND THROAT WITH OR WITHOUT PRESENCE OF ULCERS (sore throat, sores in mouth, or a toothache) UNUSUAL RASH, SWELLING OR PAIN  UNUSUAL VAGINAL DISCHARGE OR ITCHING   Items with * indicate a potential emergency and should be followed up as soon as possible or go to the Emergency Department if any problems should occur.  Please show the CHEMOTHERAPY ALERT CARD or IMMUNOTHERAPY ALERT CARD at  check-in to the Emergency Department and triage nurse.  Should you have questions after your visit or need to cancel or reschedule your appointment, please contact [facility]  Dept: [phone]  and follow the prompts.  Office hours are 8:00 a.m. to 4:30 p.m. Monday - Friday. Please note that voicemails left after 4:00 p.m. may not be returned until the following business day.  We are closed weekends and major holidays. You have access to a nurse at all times for urgent questions. Please call the main number to the clinic Dept: [phone] and follow the prompts.   For any non-urgent questions, you may also contact your provider using MyChart. We now offer e-Visits for anyone 61 and older to request care online for non-urgent symptoms. For details visit [email or web address].   Also download the MyChart app! Go to the app store, search "MyChart", open the app, select [facility], and log in with your MyChart username and password.  Due to Covid, a mask is required upon entering the hospital/clinic. If you do not have a mask, one will be given to you upon arrival. For doctor visits, patients may have 1 support person aged 29 or older with them. For treatment visits, patients cannot have anyone with them due to current Covid guidelines and our immunocompromised population.

## 2020-11-05 LAB — CANCER ANTIGEN 19-9: CA 19-9: 5853 U/mL — ABNORMAL HIGH (ref 0–35)

## 2020-11-06 ENCOUNTER — Encounter: Payer: Self-pay | Admitting: Hematology and Oncology

## 2020-11-13 ENCOUNTER — Encounter: Payer: Self-pay | Admitting: Hematology and Oncology

## 2020-11-15 ENCOUNTER — Telehealth: Payer: Self-pay | Admitting: *Deleted

## 2020-11-15 NOTE — Telephone Encounter (Signed)
11/14/2020 Connected with [employer] Disability specialist [place], regarding Disability letter faxed to [phone] 11/11/2020 which did not include Attending Physician Certification form or signed authorization and disclosure.  "You may write answers to questions on the letter.   No, the Physician does not need to sign. RASHI GIULIANI has not returned authorization back to [place].  We will send it when received."  Provided [facility] main office, toll free fax number [phone].  11/15/2020 returned letter to [name].

## 2020-11-17 ENCOUNTER — Encounter: Payer: Self-pay | Admitting: Hematology and Oncology

## 2020-11-18 ENCOUNTER — Encounter: Payer: Self-pay | Admitting: Family Medicine

## 2020-11-18 ENCOUNTER — Other Ambulatory Visit: Payer: Self-pay | Admitting: *Deleted

## 2020-11-18 ENCOUNTER — Ambulatory Visit (INDEPENDENT_AMBULATORY_CARE_PROVIDER_SITE_OTHER): Payer: BC Managed Care – PPO | Admitting: Family Medicine

## 2020-11-18 ENCOUNTER — Other Ambulatory Visit: Payer: Self-pay

## 2020-11-18 VITALS — BP 124/82 | HR 93 | Temp 97.5°F | Ht 68.0 in | Wt 221.0 lb

## 2020-11-18 DIAGNOSIS — Z23 Encounter for immunization: Secondary | ICD-10-CM | POA: Diagnosis not present

## 2020-11-18 DIAGNOSIS — M25511 Pain in right shoulder: Secondary | ICD-10-CM | POA: Diagnosis not present

## 2020-11-18 DIAGNOSIS — I10 Essential (primary) hypertension: Secondary | ICD-10-CM

## 2020-11-18 DIAGNOSIS — E871 Hypo-osmolality and hyponatremia: Secondary | ICD-10-CM | POA: Diagnosis not present

## 2020-11-18 DIAGNOSIS — M791 Myalgia, unspecified site: Secondary | ICD-10-CM

## 2020-11-18 DIAGNOSIS — M25512 Pain in left shoulder: Secondary | ICD-10-CM

## 2020-11-18 MED ORDER — GABAPENTIN 300 MG PO CAPS: 300.0000 mg | ORAL_CAPSULE | Freq: Three times a day (TID) | ORAL | 2 refills | Status: DC

## 2020-11-18 NOTE — Progress Notes (Signed)
   Subjective:    Patient ID: Logan Alvarez, male    DOB: Jul 05, 1955, 65 y.o.   MRN: [id number]  Hypertension This is a chronic problem. Treatments tried: losartan, amlodipine.   Bilateral shoulder pain, unspecified chronicity - Plan: Sed Rate (ESR), C-reactive protein, CK (Creatine Kinase)  Pain of both shoulder joints - Plan: Sed Rate (ESR), C-reactive protein, CK (Creatine Kinase)  Myalgia - Plan: Sed Rate (ESR), C-reactive protein, CK (Creatine Kinase)  Hyponatremia  Primary hypertension - Plan: Sed Rate (ESR), C-reactive protein, CK (Creatine Kinase)  Immunization due - Plan: Flu Vaccine QUAD High Dose(Fluad) Patient doing well with medicine He does state that he is having a lot of shoulder pain arm pain muscle pain that he relates to his chemotherapy Patient under a fair amount of stress to some degree anxious and nervous but doing okay on his medications  Review of Systems     Objective:   Physical Exam General-in no acute distress Eyes-no discharge Lungs-respiratory rate normal, CTA CV-no murmurs,RRR Extremities skin warm dry no edema Neuro grossly normal Behavior normal, alert        Assessment & Plan:   1. Bilateral shoulder pain, unspecified chronicity We will check lab work.  Could well be due to his chemotherapy but may need other intervention depending on labs he will do the labs at his next visit with his oncology group - Sed Rate (ESR) - C-reactive protein - CK (Creatine Kinase)  2. Pain of both shoulder joints See above - Sed Rate (ESR) - C-reactive protein - CK (Creatine Kinase)  3. Myalgia Muscle pain in the arms none in the legs I doubt that this is due to his statins.  We will check a CK level - Sed Rate (ESR) - C-reactive protein - CK (Creatine Kinase)  4. Hyponatremia Sodium has been low the past few times he drinks a large amount of water I have encouraged him to try to consider drinking less water he will have this rechecked again at  his next visit with oncology next week  5. Primary hypertension Blood pressure check with blood pressure cuff and with our cuff under good control blood pressure readings high in the office but normal at home we will use one of the blood pressure readings he had from his blood pressure cuff from home use - Sed Rate (ESR) - C-reactive protein - CK (Creatine Kinase)  6. Immunization due Flu shot today - Flu Vaccine QUAD High Dose(Fluad) Follow-up in 3 months

## 2020-11-18 NOTE — Patient Instructions (Signed)

## 2020-11-25 ENCOUNTER — Other Ambulatory Visit: Payer: Self-pay | Admitting: Family Medicine

## 2020-11-25 ENCOUNTER — Inpatient Hospital Stay: Payer: BC Managed Care – PPO

## 2020-11-25 ENCOUNTER — Inpatient Hospital Stay (HOSPITAL_BASED_OUTPATIENT_CLINIC_OR_DEPARTMENT_OTHER): Payer: BC Managed Care – PPO | Admitting: Hematology and Oncology

## 2020-11-25 ENCOUNTER — Other Ambulatory Visit: Payer: Self-pay

## 2020-11-25 VITALS — BP 141/86 | HR 84 | Temp 98.1°F | Resp 17 | Wt 218.0 lb

## 2020-11-25 DIAGNOSIS — C220 Liver cell carcinoma: Secondary | ICD-10-CM

## 2020-11-25 DIAGNOSIS — I10 Essential (primary) hypertension: Secondary | ICD-10-CM | POA: Diagnosis not present

## 2020-11-25 DIAGNOSIS — Z7189 Other specified counseling: Secondary | ICD-10-CM

## 2020-11-25 DIAGNOSIS — R970 Elevated carcinoembryonic antigen [CEA]: Secondary | ICD-10-CM | POA: Diagnosis not present

## 2020-11-25 DIAGNOSIS — M25511 Pain in right shoulder: Secondary | ICD-10-CM | POA: Diagnosis not present

## 2020-11-25 DIAGNOSIS — R978 Other abnormal tumor markers: Secondary | ICD-10-CM | POA: Diagnosis not present

## 2020-11-25 DIAGNOSIS — C786 Secondary malignant neoplasm of retroperitoneum and peritoneum: Secondary | ICD-10-CM | POA: Diagnosis not present

## 2020-11-25 DIAGNOSIS — M791 Myalgia, unspecified site: Secondary | ICD-10-CM | POA: Diagnosis not present

## 2020-11-25 DIAGNOSIS — Z79899 Other long term (current) drug therapy: Secondary | ICD-10-CM | POA: Diagnosis not present

## 2020-11-25 DIAGNOSIS — Z5112 Encounter for antineoplastic immunotherapy: Secondary | ICD-10-CM | POA: Diagnosis not present

## 2020-11-25 DIAGNOSIS — M25512 Pain in left shoulder: Secondary | ICD-10-CM | POA: Diagnosis not present

## 2020-11-25 LAB — CMP (CANCER CENTER ONLY)
ALT: 15 U/L (ref 0–44)
AST: 18 U/L (ref 15–41)
Albumin: 3.7 g/dL (ref 3.5–5.0)
Alkaline Phosphatase: 94 U/L (ref 38–126)
Anion gap: 10 (ref 5–15)
BUN: 10 mg/dL (ref 8–23)
CO2: 23 mmol/L (ref 22–32)
Calcium: 9.2 mg/dL (ref 8.9–10.3)
Chloride: 97 mmol/L — ABNORMAL LOW (ref 98–111)
Creatinine: 0.52 mg/dL — ABNORMAL LOW (ref 0.61–1.24)
GFR, Estimated: >60 mL/min (ref >60)
Glucose, Bld: 138 mg/dL — ABNORMAL HIGH (ref 70–99)
Potassium: 4.2 mmol/L (ref 3.5–5.1)
Sodium: 130 mmol/L — ABNORMAL LOW (ref 135–145)
Total Bilirubin: 0.8 mg/dL (ref 0.3–1.2)
Total Protein: 7.9 g/dL (ref 6.5–8.1)

## 2020-11-25 LAB — CBC WITH DIFFERENTIAL (CANCER CENTER ONLY)
Abs Immature Granulocytes: 0.03 10*3/uL (ref 0.00–0.07)
Basophils Absolute: 0.1 10*3/uL (ref 0.0–0.1)
Basophils Relative: 1 %
Eosinophils Absolute: 0.3 10*3/uL (ref 0.0–0.5)
Eosinophils Relative: 3 %
HCT: 39.3 % (ref 39.0–52.0)
Hemoglobin: 13.5 g/dL (ref 13.0–17.0)
Immature Granulocytes: 0 %
Lymphocytes Relative: 13 %
Lymphs Abs: 1.3 10*3/uL (ref 0.7–4.0)
MCH: 28.9 pg (ref 26.0–34.0)
MCHC: 34.4 g/dL (ref 30.0–36.0)
MCV: 84.2 fL (ref 80.0–100.0)
Monocytes Absolute: 1.2 10*3/uL — ABNORMAL HIGH (ref 0.1–1.0)
Monocytes Relative: 12 %
Neutro Abs: 7.2 10*3/uL (ref 1.7–7.7)
Neutrophils Relative %: 71 %
Platelet Count: 340 10*3/uL (ref 150–400)
RBC: 4.67 MIL/uL (ref 4.22–5.81)
RDW: 14.3 % (ref 11.5–15.5)
WBC Count: 10 10*3/uL (ref 4.0–10.5)
nRBC: 0 % (ref 0.0–0.2)

## 2020-11-25 LAB — PROTEIN / CREATININE RATIO, URINE
Creatinine, Urine: 38.95 mg/dL
Total Protein, Urine: <6 mg/dL

## 2020-11-25 LAB — LACTATE DEHYDROGENASE: LDH: 156 U/L (ref 98–192)

## 2020-11-25 MED ORDER — SODIUM CHLORIDE 0.9 % IV SOLN
1200.0000 mg | Freq: Once | INTRAVENOUS | Status: AC
  Administered 2020-11-25: 1200 mg via INTRAVENOUS
  Filled 2020-11-25: qty 20

## 2020-11-25 MED ORDER — SODIUM CHLORIDE 0.9 % IV SOLN
15.0000 mg/kg | Freq: Once | INTRAVENOUS | Status: AC
  Administered 2020-11-25: 1500 mg via INTRAVENOUS
  Filled 2020-11-25: qty 12

## 2020-11-25 MED ORDER — SODIUM CHLORIDE 0.9 % IV SOLN
Freq: Once | INTRAVENOUS | Status: AC
Start: 2020-11-25 — End: 2020-11-25

## 2020-11-25 NOTE — Patient Instructions (Signed)
[facility] ONCOLOGY  Discharge Instructions: Thank you for choosing [facility] to provide your oncology and hematology care.   If you have a lab appointment with the [facility], please go directly to the [facility] and check in at the registration area.   Wear comfortable clothing and clothing appropriate for easy access to any Portacath or PICC line.   We strive to give you quality time with your provider. You may need to reschedule your appointment if you arrive late (15 or more minutes).  Arriving late affects you and other patients whose appointments are after yours.  Also, if you miss three or more appointments without notifying the office, you may be dismissed from the clinic at the provider's discretion.      For prescription refill requests, have your pharmacy contact our office and allow 72 hours for refills to be completed.    Today you received the following chemotherapy and/or immunotherapy agents Tecentriq and Avastin      To help prevent nausea and vomiting after your treatment, we encourage you to take your nausea medication as directed.  BELOW ARE SYMPTOMS THAT SHOULD BE REPORTED IMMEDIATELY: *FEVER GREATER THAN 100.4 F (38 C) OR HIGHER *CHILLS OR SWEATING *NAUSEA AND VOMITING THAT IS NOT CONTROLLED WITH YOUR NAUSEA MEDICATION *UNUSUAL SHORTNESS OF BREATH *UNUSUAL BRUISING OR BLEEDING *URINARY PROBLEMS (pain or burning when urinating, or frequent urination) *BOWEL PROBLEMS (unusual diarrhea, constipation, pain near the anus) TENDERNESS IN MOUTH AND THROAT WITH OR WITHOUT PRESENCE OF ULCERS (sore throat, sores in mouth, or a toothache) UNUSUAL RASH, SWELLING OR PAIN  UNUSUAL VAGINAL DISCHARGE OR ITCHING   Items with * indicate a potential emergency and should be followed up as soon as possible or go to the Emergency Department if any problems should occur.  Please show the CHEMOTHERAPY ALERT CARD or IMMUNOTHERAPY ALERT CARD at  check-in to the Emergency Department and triage nurse.  Should you have questions after your visit or need to cancel or reschedule your appointment, please contact [facility]  Dept: [phone]  and follow the prompts.  Office hours are 8:00 a.m. to 4:30 p.m. Monday - Friday. Please note that voicemails left after 4:00 p.m. may not be returned until the following business day.  We are closed weekends and major holidays. You have access to a nurse at all times for urgent questions. Please call the main number to the clinic Dept: [phone] and follow the prompts.   For any non-urgent questions, you may also contact your provider using MyChart. We now offer e-Visits for anyone 66 and older to request care online for non-urgent symptoms. For details visit [email or web address].   Also download the MyChart app! Go to the app store, search "MyChart", open the app, select [facility], and log in with your MyChart username and password.  Due to Covid, a mask is required upon entering the hospital/clinic. If you do not have a mask, one will be given to you upon arrival. For doctor visits, patients may have 1 support person aged 66 or older with them. For treatment visits, patients cannot have anyone with them due to current Covid guidelines and our immunocompromised population.

## 2020-11-26 ENCOUNTER — Encounter: Payer: Self-pay | Admitting: Hematology and Oncology

## 2020-11-26 ENCOUNTER — Other Ambulatory Visit: Payer: Self-pay | Admitting: Hematology and Oncology

## 2020-11-26 LAB — CK: Total CK: 42 U/L (ref 41–331)

## 2020-11-26 LAB — CEA (IN HOUSE-CHCC): CEA (CHCC-In House): 36.87 ng/mL — ABNORMAL HIGH (ref 0.00–5.00)

## 2020-11-26 LAB — SEDIMENTATION RATE: Sed Rate: 34 mm/hr — ABNORMAL HIGH (ref 0–30)

## 2020-11-26 LAB — C-REACTIVE PROTEIN: CRP: 33 mg/L — ABNORMAL HIGH (ref 0–10)

## 2020-11-26 LAB — CANCER ANTIGEN 19-9: CA 19-9: 6501 U/mL — ABNORMAL HIGH (ref 0–35)

## 2020-11-26 MED ORDER — GABAPENTIN 300 MG PO CAPS
600.0000 mg | ORAL_CAPSULE | Freq: Three times a day (TID) | ORAL | 1 refills | Status: DC
Start: 2020-11-26 — End: 2020-12-04

## 2020-11-26 NOTE — Progress Notes (Signed)
[place] [phone]   [phone]  PROGRESS NOTE  Patient Care Team: Kathyrn Drown, MD as PCP - General (Family Medicine) Eloise Harman, DO as Consulting Physician (Internal Medicine) Orson Slick, MD as Consulting Physician (Hematology and Oncology)  Hematological/Oncological History # Hepatocellular Carcinoma, Stage IV 10/19/17: abdomen u/s 1.7 x 1.4 x 2.0 cm solid-appearing hepatic nodule with echogenic rim consistent suggesting a calcified rim 11/08/17: liver needle core biopsy. Hepatocellular carcinoma, poorly differentiated in a background of necrosis. Reviewed at [facility], HepPar1, [place] 3 12-08-2017: robotic right partial hepatectomy. Path: T1b HCC 06/25/2020: CT abdomen shows nodular studding of the ventral mesenteric fat, findings consistent with peritoneal involvement/carcinomatosis. 06/25/2020: US guided biopsy consistent with poorly differentiated Hepatocelluar carcinoma. CA 19-9 5238, CEA 43.3 07/16/2020: clinic visit with Dr. Leamon Arnt at [facility]. Referred to [facility] for care closer to home 07/29/2020: establish care with Dr. Lorenso Courier 08/15/2020: Cycle 1 Day 1 of Atezolizumab/Bevacizumab 09/04/2020: Cycle 2 Day 1 of Atezolizumab/Bevacizumab 09/23/2020: Cycle 3 Day 1 of Atezolizumab/Bevacizumab 10/15/2020: Cycle 4 Day 1 of Atezolizumab/Bevacizumab 11/04/2020: Cycle 5 Day 1 of Atezolizumab/Bevacizumab 11/25/2020: Cycle 6 Day 1 of Atezolizumab/Bevacizumab  Interval History:  Logan Alvarez 65 y.o. male with medical history significant for stage IV HCC who presents for a follow up visit. The patient's last visit was on 11/04/2020 at which time he started Cycle 5 of atezo/bev. In the interim since the last visit he has had no major changes in his health.   On exam today Logan Alvarez reports he continues to struggle with muscle soreness.  He reports that there is mostly soreness in his chest, arms, and shoulders.  He  describes this as a weakness and he notes the gabapentin is helping some.  He reports that he is not having any issues with major side effects.  He is having a little bit of sinus congestion which makes it difficult to sleep at night.  He has been using a saline solution to try to help with this.  He notes that he is also interested in undergoing dental work in the near future, in particular having 2 implants on the right side.  He is also making progress with his FMLA/disability.  He otherwise denies any fevers, chills, sweats, nausea, vomiting or diarrhea.  Denies any bleeding or bruising.  A full 10 point ROS is listed below.  MEDICAL HISTORY:  Past Medical History:  Diagnosis Date   Cancer ([place]) 2020   liver tumor removed   Chest pain    a. 2008: Normal ETT;  b. 01/2013 CTA: Ca Alvarez of 715 (92%'ile), LAD 50p, D1 50-75, RCA 50p.   Hyperlipidemia    Hypertension    Testosterone deficiency 2009    SURGICAL HISTORY: Past Surgical History:  Procedure Laterality Date   BALLOON DILATION  07/30/2020   Procedure: BALLOON DILATION;  Surgeon: Eloise Harman, DO;  [facility];  Service: Endoscopy;;   COLONOSCOPY N/A 03/12/2017   Procedure: COLONOSCOPY;  Surgeon: Danie Binder, MD;  [facility];  Service: Endoscopy;  Laterality: N/A;  2:00   COLONOSCOPY WITH PROPOFOL N/A 06/18/2020   Procedure: COLONOSCOPY WITH PROPOFOL;  Surgeon: Eloise Harman, DO;  [facility];  Service: Endoscopy;  Laterality: N/A;  pm appt   ESOPHAGOGASTRODUODENOSCOPY (EGD) WITH PROPOFOL N/A 07/30/2020   Procedure: ESOPHAGOGASTRODUODENOSCOPY (EGD) WITH PROPOFOL;  Surgeon: Eloise Harman, DO;  [facility];  Service: Endoscopy;  Laterality: N/A;  11:45am   LEFT HEART CATHETERIZATION WITH CORONARY ANGIOGRAM N/A 02/01/2013   Procedure: LEFT HEART CATHETERIZATION WITH CORONARY ANGIOGRAM;  Surgeon: Josue Hector, MD;  Location: [facility] CATH LAB;  Service: Cardiovascular;  Laterality:  N/A;   NECK SURGERY     POLYPECTOMY  06/18/2020   Procedure: POLYPECTOMY;  Surgeon: Eloise Harman, DO;  [facility];  Service: Endoscopy;;  snare and biospy    SOCIAL HISTORY: Social History   Socioeconomic History   Marital status: Widowed    Spouse name: Not on file   Number of children: Not on file   Years of education: Not on file   Highest education level: Not on file  Occupational History   Not on file  Tobacco Use   Smoking status: Never   Smokeless tobacco: Never  Vaping Use   Vaping Use: Never used  Substance and Sexual Activity   Alcohol use: Yes    Comment: 4-6 beers a day   Drug use: No   Sexual activity: Not on file  Other Topics Concern   Not on file  Social History Narrative   Not on file   Social Determinants of Health   Financial Resource Strain: Not on file  Food Insecurity: Not on file  Transportation Needs: Not on file  Physical Activity: Not on file  Stress: Not on file  Social Connections: Not on file  Intimate Partner Violence: Not on file    FAMILY HISTORY: Family History  Problem Relation Age of Onset   Heart attack Father        22's    ALLERGIES:  is allergic to cymbalta [duloxetine hcl], lodine [etodolac], and zoloft [sertraline hcl].  MEDICATIONS:  Current Outpatient Medications  Medication Sig Dispense Refill   ALPRAZolam (XANAX) 0.5 MG tablet 1/2 tablet to 1 tablet up to 3 times a day as needed for anxiety 90 tablet 5   amLODipine (NORVASC) 10 MG tablet Take 1 tablet (10 mg total) by mouth daily. 90 tablet 1   dextromethorphan-guaiFENesin (ROBITUSSIN-DM) 10-100 MG/5ML liquid Take by mouth.     fluticasone (FLONASE) 50 MCG/ACT nasal spray Place into the nose.     ibuprofen (ADVIL,MOTRIN) 200 MG tablet Take 400 mg by mouth every 8 (eight) hours as needed (for pain.).     losartan (COZAAR) 50 MG tablet Take 1 tablet (50 mg total) by mouth daily. 90 tablet 1   pravastatin (PRAVACHOL) 20 MG tablet Take 1 tablet (20  mg total) by mouth daily. 90 tablet 1   sodium chloride (OCEAN) 0.65 % nasal spray 1 spray as needed for Congestion     gabapentin (NEURONTIN) 300 MG capsule Take 2 capsules (600 mg total) by mouth 3 (three) times daily. 180 capsule 1   No current facility-administered medications for this visit.    REVIEW OF SYSTEMS:   Constitutional: ( - ) fevers, ( - )  chills , ( - ) night sweats Eyes: ( - ) blurriness of vision, ( - ) double vision, ( - ) watery eyes Ears, nose, mouth, throat, and face: ( - ) mucositis, ( - ) sore throat Respiratory: ( - ) cough, ( - ) dyspnea, ( - ) wheezes Cardiovascular: ( - ) palpitation, ( - ) chest discomfort, ( - ) lower extremity swelling Gastrointestinal:  ( - ) nausea, ( - ) heartburn, ( - ) change in bowel habits Skin: ( - ) abnormal skin rashes Lymphatics: ( - ) new lymphadenopathy, ( - )  easy bruising Neurological: ( - ) numbness, ( - ) tingling, ( - ) new weaknesses Behavioral/Psych: ( - ) mood change, ( - ) new changes  All other systems were reviewed with the patient and are negative.  PHYSICAL EXAMINATION: ECOG PERFORMANCE STATUS: 1 - Symptomatic but completely ambulatory  Vitals:   11/25/20 1120  BP: (!) 141/86  Pulse: 84  Resp: 17  Temp: 98.1 F (36.7 C)  SpO2: 98%    Filed Weights   11/25/20 1120  Weight: 218 lb (98.9 kg)   GENERAL: Well-appearing elderly Caucasian male, alert, no distress and comfortable SKIN: skin color, texture, turgor are normal, no rashes or significant lesions EYES: conjunctiva are pink and non-injected, sclera clear LUNGS: clear to auscultation and percussion with normal breathing effort HEART: regular rate & rhythm and no murmurs and no lower extremity edema Musculoskeletal: no cyanosis of digits and no clubbing  PSYCH: alert & oriented x 3, fluent speech NEURO: no focal motor/sensory deficits  LABORATORY DATA:  I have reviewed the data as listed CBC Latest Ref Rng & Units 11/25/2020 11/04/2020 10/14/2020   WBC 4.0 - 10.5 K/uL 10.0 9.8 9.5  Hemoglobin 13.0 - 17.0 g/dL 13.5 13.7 13.9  Hematocrit 39.0 - 52.0 % 39.3 38.6(L) 40.1  Platelets 150 - 400 K/uL 340 288 324    CMP Latest Ref Rng & Units 11/25/2020 11/04/2020 10/14/2020  Glucose 70 - 99 mg/dL 138(H) 128(H) 121(H)  BUN 8 - 23 mg/dL 10 10 11   Creatinine 0.61 - 1.24 mg/dL 0.52(L) 0.69 0.74  Sodium 135 - 145 mmol/L 130(L) 130(L) 133(L)  Potassium 3.5 - 5.1 mmol/L 4.2 4.3 4.1  Chloride 98 - 111 mmol/L 97(L) 96(L) 100  CO2 22 - 32 mmol/L 23 25 24   Calcium 8.9 - 10.3 mg/dL 9.2 9.4 9.8  Total Protein 6.5 - 8.1 g/dL 7.9 7.6 8.0  Total Bilirubin 0.3 - 1.2 mg/dL 0.8 1.1 1.0  Alkaline Phos 38 - 126 U/L 94 113 109  AST 15 - 41 U/L 18 15 15   ALT 0 - 44 U/L 15 15 13     RADIOGRAPHIC STUDIES: No results found.  ASSESSMENT & PLAN Logan Alvarez 65 y.o. male with medical history significant for stage IV HCC who presents for a follow up visit.   After review of the labs, review of the records, and discussion with the patient the patients findings are most consistent with recurrent metastatic hepatocellular carcinoma. Previously I discussed the treatment options moving forward this patient.  It appears though these treatment options were already discussed with Dr. Leamon Arnt at [facility].  The options as I see it would be bevacizumab and atezolizumab, sorafenib, or lenvatinib.  After the risks and benefits of each were discussed the patient noted he would like to proceed with bevacizumab and atezolizumab.  EGD performed 07/30/2020, no evidence of varices Logan Alvarez: A  # Hepatocellular Carcinoma, Stage IV --At this time I favor bevacizumab and atezolizumab q. 21 days.  Patient is considered stage IV due to peritoneal carcinomatosis of HCC. --EGD performed, no evidence of varices --Patient has recent imaging from Sept 2022, shows stable disease.  We will plan to have imaging repeated q. 3 months. Next due Dec 2022.  --patient started  Cycle 1 Day 1 on 08/15/2020.  --tumor markers: CA 19-9 at 6501, CEA at 36.87 --today is Cycle 6 Day 1 of treatment. There are no barriers to proceeding with treatment today.  --Return to clinic in 3 weeks for Cycle 7  Day 1  #Muscle Soreness --Can be a side effect of both bevacizumab and atezolizumab  -- We will increase gabapentin to 600 mg TID --Continue to monitor   #Supportive Care -- chemotherapy education performed -- port placement offered, declined at this time -- No antiemetics required as this is not an emetogenic treatment -- no pain medication required at this time.   No orders of the defined types were placed in this encounter.   All questions were answered. The patient knows to call the clinic with any problems, questions or concerns.  A total of more than 30 minutes were spent on this encounter with face-to-face time and non-face-to-face time, including preparing to see the patient, ordering tests and/or medications, counseling the patient and coordination of care as outlined above.   Ledell Peoples, MD Department of Hematology/Oncology [place] at [facility] Phone: [phone] Pager: [phone] Email: [email or web address]  11/26/2020 5:50 PM

## 2020-11-28 ENCOUNTER — Encounter: Payer: Self-pay | Admitting: Hematology and Oncology

## 2020-12-01 DIAGNOSIS — R0789 Other chest pain: Secondary | ICD-10-CM | POA: Diagnosis not present

## 2020-12-01 DIAGNOSIS — R079 Chest pain, unspecified: Secondary | ICD-10-CM | POA: Diagnosis not present

## 2020-12-02 ENCOUNTER — Telehealth: Payer: Self-pay | Admitting: *Deleted

## 2020-12-02 ENCOUNTER — Other Ambulatory Visit: Payer: Self-pay

## 2020-12-02 ENCOUNTER — Inpatient Hospital Stay (HOSPITAL_COMMUNITY)
Admission: EM | Admit: 2020-12-02 | Discharge: 2020-12-04 | DRG: 247 | Disposition: A | Discharge status: Home / Self Care | Payer: BC Managed Care – PPO | Attending: Internal Medicine | Admitting: Internal Medicine

## 2020-12-02 ENCOUNTER — Encounter (HOSPITAL_COMMUNITY): Payer: Self-pay | Admitting: *Deleted

## 2020-12-02 ENCOUNTER — Emergency Department (HOSPITAL_COMMUNITY): Payer: BC Managed Care – PPO

## 2020-12-02 DIAGNOSIS — Z79899 Other long term (current) drug therapy: Secondary | ICD-10-CM | POA: Diagnosis not present

## 2020-12-02 DIAGNOSIS — I25119 Atherosclerotic heart disease of native coronary artery with unspecified angina pectoris: Secondary | ICD-10-CM | POA: Diagnosis not present

## 2020-12-02 DIAGNOSIS — I200 Unstable angina: Secondary | ICD-10-CM | POA: Diagnosis not present

## 2020-12-02 DIAGNOSIS — I2510 Atherosclerotic heart disease of native coronary artery without angina pectoris: Secondary | ICD-10-CM | POA: Diagnosis present

## 2020-12-02 DIAGNOSIS — Z8249 Family history of ischemic heart disease and other diseases of the circulatory system: Secondary | ICD-10-CM

## 2020-12-02 DIAGNOSIS — Z955 Presence of coronary angioplasty implant and graft: Secondary | ICD-10-CM

## 2020-12-02 DIAGNOSIS — N529 Male erectile dysfunction, unspecified: Secondary | ICD-10-CM | POA: Diagnosis present

## 2020-12-02 DIAGNOSIS — Z20822 Contact with and (suspected) exposure to covid-19: Secondary | ICD-10-CM | POA: Diagnosis not present

## 2020-12-02 DIAGNOSIS — I25118 Atherosclerotic heart disease of native coronary artery with other forms of angina pectoris: Secondary | ICD-10-CM | POA: Diagnosis present

## 2020-12-02 DIAGNOSIS — F419 Anxiety disorder, unspecified: Secondary | ICD-10-CM | POA: Diagnosis not present

## 2020-12-02 DIAGNOSIS — E782 Mixed hyperlipidemia: Secondary | ICD-10-CM | POA: Diagnosis not present

## 2020-12-02 DIAGNOSIS — R0789 Other chest pain: Secondary | ICD-10-CM | POA: Diagnosis present

## 2020-12-02 DIAGNOSIS — R978 Other abnormal tumor markers: Secondary | ICD-10-CM | POA: Diagnosis not present

## 2020-12-02 DIAGNOSIS — E222 Syndrome of inappropriate secretion of antidiuretic hormone: Secondary | ICD-10-CM | POA: Diagnosis not present

## 2020-12-02 DIAGNOSIS — Z888 Allergy status to other drugs, medicaments and biological substances status: Secondary | ICD-10-CM | POA: Diagnosis not present

## 2020-12-02 DIAGNOSIS — E349 Endocrine disorder, unspecified: Secondary | ICD-10-CM | POA: Diagnosis present

## 2020-12-02 DIAGNOSIS — C220 Liver cell carcinoma: Secondary | ICD-10-CM | POA: Diagnosis present

## 2020-12-02 DIAGNOSIS — E291 Testicular hypofunction: Secondary | ICD-10-CM | POA: Diagnosis present

## 2020-12-02 DIAGNOSIS — R079 Chest pain, unspecified: Secondary | ICD-10-CM

## 2020-12-02 DIAGNOSIS — M25512 Pain in left shoulder: Secondary | ICD-10-CM | POA: Diagnosis present

## 2020-12-02 DIAGNOSIS — E871 Hypo-osmolality and hyponatremia: Secondary | ICD-10-CM | POA: Diagnosis not present

## 2020-12-02 DIAGNOSIS — Z791 Long term (current) use of non-steroidal anti-inflammatories (NSAID): Secondary | ICD-10-CM

## 2020-12-02 DIAGNOSIS — R778 Other specified abnormalities of plasma proteins: Secondary | ICD-10-CM | POA: Diagnosis not present

## 2020-12-02 DIAGNOSIS — I10 Essential (primary) hypertension: Secondary | ICD-10-CM | POA: Diagnosis present

## 2020-12-02 DIAGNOSIS — R7989 Other specified abnormal findings of blood chemistry: Secondary | ICD-10-CM | POA: Diagnosis present

## 2020-12-02 DIAGNOSIS — M25511 Pain in right shoulder: Secondary | ICD-10-CM | POA: Diagnosis present

## 2020-12-02 DIAGNOSIS — I214 Non-ST elevation (NSTEMI) myocardial infarction: Principal | ICD-10-CM | POA: Diagnosis present

## 2020-12-02 DIAGNOSIS — R970 Elevated carcinoembryonic antigen [CEA]: Secondary | ICD-10-CM | POA: Diagnosis present

## 2020-12-02 DIAGNOSIS — G8929 Other chronic pain: Secondary | ICD-10-CM | POA: Diagnosis not present

## 2020-12-02 HISTORY — DX: Liver cell carcinoma: C22.0

## 2020-12-02 HISTORY — DX: Atherosclerotic heart disease of native coronary artery without angina pectoris: I25.10

## 2020-12-02 LAB — CBC
HCT: 44.3 % (ref 39.0–52.0)
Hemoglobin: 15.8 g/dL (ref 13.0–17.0)
MCH: 30.7 pg (ref 26.0–34.0)
MCHC: 35.7 g/dL (ref 30.0–36.0)
MCV: 86 fL (ref 80.0–100.0)
Platelets: 306 10*3/uL (ref 150–400)
RBC: 5.15 MIL/uL (ref 4.22–5.81)
RDW: 14.1 % (ref 11.5–15.5)
WBC: 10 10*3/uL (ref 4.0–10.5)
nRBC: 0 % (ref 0.0–0.2)

## 2020-12-02 LAB — TROPONIN I (HIGH SENSITIVITY)
Troponin I (High Sensitivity): 3068 ng/L (ref <18)
Troponin I (High Sensitivity): 2916 ng/L (ref <18)

## 2020-12-02 LAB — COMPREHENSIVE METABOLIC PANEL
ALT: 20 U/L (ref 0–44)
AST: 37 U/L (ref 15–41)
Albumin: 4 g/dL (ref 3.5–5.0)
Alkaline Phosphatase: 104 U/L (ref 38–126)
Anion gap: 10 (ref 5–15)
BUN: 11 mg/dL (ref 8–23)
CO2: 24 mmol/L (ref 22–32)
Calcium: 9.4 mg/dL (ref 8.9–10.3)
Chloride: 94 mmol/L — ABNORMAL LOW (ref 98–111)
Creatinine, Ser: 0.61 mg/dL (ref 0.61–1.24)
GFR, Estimated: >60 mL/min (ref >60)
Glucose, Bld: 116 mg/dL — ABNORMAL HIGH (ref 70–99)
Potassium: 4.1 mmol/L (ref 3.5–5.1)
Sodium: 128 mmol/L — ABNORMAL LOW (ref 135–145)
Total Bilirubin: 1.2 mg/dL (ref 0.3–1.2)
Total Protein: 8.1 g/dL (ref 6.5–8.1)

## 2020-12-02 LAB — RESP PANEL BY RT-PCR (FLU A&B, COVID) ARPGX2
Influenza A by PCR: NEGATIVE
Influenza B by PCR: NEGATIVE
SARS Coronavirus 2 by RT PCR: NEGATIVE

## 2020-12-02 LAB — HEMOGLOBIN A1C
Hgb A1c MFr Bld: 5.1 % (ref 4.8–5.6)
Mean Plasma Glucose: 99.67 mg/dL

## 2020-12-02 LAB — TSH: TSH: 5.299 u[IU]/mL — ABNORMAL HIGH (ref 0.350–4.500)

## 2020-12-02 MED ORDER — FENTANYL CITRATE PF 50 MCG/ML IJ SOSY
25.0000 ug | PREFILLED_SYRINGE | INTRAMUSCULAR | Status: DC | PRN
  Administered 2020-12-03 (×2): 25 ug via INTRAVENOUS
  Filled 2020-12-02 (×3): qty 1

## 2020-12-02 MED ORDER — ALPRAZOLAM 0.5 MG PO TABS: 0.5000 mg | ORAL_TABLET | Freq: Three times a day (TID) | ORAL | Status: DC | PRN

## 2020-12-02 MED ORDER — ONDANSETRON HCL 4 MG PO TABS: 4.0000 mg | ORAL_TABLET | Freq: Four times a day (QID) | ORAL | Status: DC | PRN

## 2020-12-02 MED ORDER — SODIUM CHLORIDE 0.9 % WEIGHT BASED INFUSION
3.0000 mL/kg/h | INTRAVENOUS | Status: DC
  Administered 2020-12-03: 3 mL/kg/h via INTRAVENOUS

## 2020-12-02 MED ORDER — SODIUM CHLORIDE 0.9% FLUSH: 3.0000 mL | INTRAVENOUS | Status: DC | PRN

## 2020-12-02 MED ORDER — LOSARTAN POTASSIUM 50 MG PO TABS
50.0000 mg | ORAL_TABLET | Freq: Every day | ORAL | Status: DC
  Administered 2020-12-02 – 2020-12-04 (×3): 50 mg via ORAL
  Filled 2020-12-02: qty 1
  Filled 2020-12-02 (×2): qty 2

## 2020-12-02 MED ORDER — ALPRAZOLAM 0.5 MG PO TABS
0.5000 mg | ORAL_TABLET | Freq: Three times a day (TID) | ORAL | Status: DC | PRN
  Administered 2020-12-02 – 2020-12-03 (×2): 1 mg via ORAL
  Administered 2020-12-03: 0.5 mg via ORAL
  Filled 2020-12-02 (×3): qty 2

## 2020-12-02 MED ORDER — FLUTICASONE PROPIONATE 50 MCG/ACT NA SUSP
2.0000 | Freq: Every day | NASAL | Status: DC | PRN
  Filled 2020-12-02: qty 16

## 2020-12-02 MED ORDER — ATORVASTATIN CALCIUM 40 MG PO TABS
80.0000 mg | ORAL_TABLET | ORAL | Status: AC
Start: 2020-12-02 — End: 2020-12-02
  Administered 2020-12-02: 80 mg via ORAL
  Filled 2020-12-02: qty 2

## 2020-12-02 MED ORDER — ASPIRIN 81 MG PO CHEW
81.0000 mg | CHEWABLE_TABLET | ORAL | Status: AC
Start: 2020-12-03 — End: 2020-12-03
  Administered 2020-12-03: 81 mg via ORAL
  Filled 2020-12-02: qty 1

## 2020-12-02 MED ORDER — SODIUM CHLORIDE 0.9 % WEIGHT BASED INFUSION
1.0000 mL/kg/h | INTRAVENOUS | Status: DC
  Administered 2020-12-03: 1 mL/kg/h via INTRAVENOUS

## 2020-12-02 MED ORDER — ONDANSETRON HCL 4 MG/2ML IJ SOLN: 4.0000 mg | Freq: Four times a day (QID) | INTRAMUSCULAR | Status: DC | PRN

## 2020-12-02 MED ORDER — ASPIRIN EC 81 MG PO TBEC
81.0000 mg | DELAYED_RELEASE_TABLET | Freq: Every day | ORAL | Status: DC
  Administered 2020-12-03 – 2020-12-04 (×2): 81 mg via ORAL
  Filled 2020-12-02 (×2): qty 1

## 2020-12-02 MED ORDER — ATORVASTATIN CALCIUM 80 MG PO TABS
80.0000 mg | ORAL_TABLET | Freq: Every day | ORAL | Status: DC
  Administered 2020-12-03 – 2020-12-04 (×2): 80 mg via ORAL
  Filled 2020-12-02 (×2): qty 2
  Filled 2020-12-02: qty 1

## 2020-12-02 MED ORDER — SODIUM CHLORIDE 0.9 % IV SOLN: 250.0000 mL | INTRAVENOUS | Status: DC | PRN

## 2020-12-02 MED ORDER — METOPROLOL TARTRATE 12.5 MG HALF TABLET
12.5000 mg | ORAL_TABLET | Freq: Two times a day (BID) | ORAL | Status: DC
  Administered 2020-12-02 – 2020-12-04 (×5): 12.5 mg via ORAL
  Filled 2020-12-02 (×5): qty 1

## 2020-12-02 MED ORDER — OXYCODONE HCL 5 MG PO TABS
5.0000 mg | ORAL_TABLET | ORAL | Status: DC | PRN
  Administered 2020-12-03 – 2020-12-04 (×7): 5 mg via ORAL
  Filled 2020-12-02 (×6): qty 1

## 2020-12-02 MED ORDER — ASPIRIN 325 MG PO TABS: 325.0000 mg | ORAL_TABLET | Freq: Once | ORAL | Status: DC

## 2020-12-02 MED ORDER — HEPARIN BOLUS VIA INFUSION
4000.0000 [IU] | Freq: Once | INTRAVENOUS | Status: AC
  Administered 2020-12-02: 4000 [IU] via INTRAVENOUS

## 2020-12-02 MED ORDER — PANTOPRAZOLE SODIUM 40 MG PO TBEC
40.0000 mg | DELAYED_RELEASE_TABLET | Freq: Every day | ORAL | Status: DC
  Administered 2020-12-03 – 2020-12-04 (×2): 40 mg via ORAL
  Filled 2020-12-02 (×2): qty 1

## 2020-12-02 MED ORDER — SODIUM CHLORIDE 0.9% FLUSH
3.0000 mL | Freq: Two times a day (BID) | INTRAVENOUS | Status: DC
  Administered 2020-12-02: 3 mL via INTRAVENOUS

## 2020-12-02 MED ORDER — SALINE SPRAY 0.65 % NA SOLN
2.0000 | NASAL | Status: DC | PRN
  Filled 2020-12-02: qty 44

## 2020-12-02 MED ORDER — HEPARIN (PORCINE) 25000 UT/250ML-% IV SOLN
1450.0000 [IU]/h | INTRAVENOUS | Status: DC
  Administered 2020-12-02: 1200 [IU]/h via INTRAVENOUS
  Filled 2020-12-02 (×2): qty 250

## 2020-12-02 NOTE — ED Triage Notes (Signed)
Episode of chest tightness last night, no pain on arrival

## 2020-12-02 NOTE — Progress Notes (Signed)
ANTICOAGULATION CONSULT NOTE - Initial Consult  Pharmacy Consult for heparin Indication: chest pain/ACS  Allergies  Allergen Reactions   Cymbalta [Duloxetine Hcl]     Bad dreams   Lodine [Etodolac] Nausea And Vomiting   Zoloft [Sertraline Hcl]     REDUCED URINARY FLOW,NOCTURIA    Patient Measurements: Height: 5\' 8"  (172.7 cm) Weight: 99.8 kg (220 lb) IBW/kg (Calculated) : 68.4 Heparin Dosing Weight: 90 kg  Vital Signs: Temp: 97.7 F (36.5 C) (10/31 1055) Temp Source: Oral (10/31 1055) BP: 151/92 (10/31 1400) Pulse Rate: 82 (10/31 1400)  Labs: Recent Labs    12/02/20 1126 12/02/20 1127 12/02/20 1313  HGB 15.8  --   --   HCT 44.3  --   --   PLT 306  --   --   CREATININE  --  0.61  --   TROPONINIHS 3,068*  --  2,916*    Estimated Creatinine Clearance: 104.1 mL/min (by C-G formula based on SCr of 0.61 mg/dL).   Medical History: Past Medical History:  Diagnosis Date   CAD (coronary artery disease)    01/2013 CTA: Ca score of 715 (92%'ile), LAD 50p, D1 50-75, RCA 50p.   Hepatocellular carcinoma ([place])    Stage IV   Hyperlipidemia    Hypertension    Testosterone deficiency 2009    Medications:  (Not in a hospital admission)   Assessment: Pharmacy consulted to dose heparin in patient with chest pain/NSTEMI.  Patient is not on anticoagulation prior to admission.  Trop 2,916 CBC WNL   Goal of Therapy:  Heparin level 0.3-0.7 units/ml Monitor platelets by anticoagulation protocol: Yes   Plan:  Give 4000 units bolus x 1 Start heparin infusion at 1200 units/hr Check anti-Xa level in 6-8 hours and daily while on heparin Continue to monitor H&H and platelets  Margot Ables, PharmD Clinical Pharmacist 12/02/2020 3:53 PM

## 2020-12-02 NOTE — H&P (Signed)
History and Physical  [facility]  TUKKER BYRNS OIZ:[id number] DOB: 1955/08/04 DOA: 12/02/2020  PCP: Kathyrn Drown, MD  Patient coming from: Home  Level of care: Telemetry  I have personally briefly reviewed patient's old medical records in [place]  Chief Complaint: Chest tightness  HPI: Logan Alvarez is a 65 y.o. male with medical history significant for stage IV hepatocellular carcinoma, coronary artery disease, hyperlipidemia, hypertension, testosterone deficiency who reportedly had an episode of left-sided chest wall tightness and discomfort last night associated with an aching pain similar to an episode he had 4 days ago.  He called EMS they came out to evaluate him and reportedly he had a normal EKG and was told to follow-up with his primary care doctor in the morning.  The patient declined going to the emergency department last night.  The patient says the pain does not radiate.  It was a gradual onset and resolved spontaneously.  Not associated with nausea or vomiting.  This was a new onset of symptoms and relieved by rest and not worsened.  He has cardiac risk factors including hypertension, hyperlipidemia he had to stop his statin in June due to hepatocellular carcinoma undergoing immunotherapy.  The treatments are causing him to have a lot of joint aches and pains and he takes ibuprofen 800 mg tablets regularly to help control the symptoms.  Since last night he has not had a recurrence of symptoms.  His daughter brought him to the emergency department today.  He was noted to have elevated high-sensitivity troponin tests around 3000.  He also complained of fatigue and very low energy.  He was seen by the cardiology team in the emergency department and they have recommended that he be admitted for IV heparin with plan to transfer to [facility] for cardiac catheterization in the near future.  His EKG and chest x-ray did not show any acute findings.  His sodium was 128.   Glucose 116, creatinine 0.61.  His CBC was normal with a hemoglobin of 15.8.  WBC 10.0 and platelet count 306.  Review of Systems: Review of Systems  Constitutional:  Positive for malaise/fatigue. Negative for chills, diaphoresis, fever and weight loss.  HENT: Negative.    Eyes: Negative.   Respiratory: Negative.    Cardiovascular:  Positive for chest pain. Negative for leg swelling and PND.  Gastrointestinal:  Negative for blood in stool, diarrhea and heartburn.  Genitourinary: Negative.   Musculoskeletal:  Positive for joint pain.  Skin:  Negative for rash.  Neurological: Negative.   Endo/Heme/Allergies: Negative.   Psychiatric/Behavioral: Negative.      Past Medical History:  Diagnosis Date   CAD (coronary artery disease)    01/2013 CTA: Ca score of 715 (92%'ile), LAD 50p, D1 50-75, RCA 50p.   Hepatocellular carcinoma ([place])    Stage IV   Hyperlipidemia    Hypertension    Testosterone deficiency 2009    Past Surgical History:  Procedure Laterality Date   BALLOON DILATION  07/30/2020   Procedure: BALLOON DILATION;  Surgeon: Eloise Harman, DO;  [facility];  Service: Endoscopy;;   COLONOSCOPY N/A 03/12/2017   Procedure: COLONOSCOPY;  Surgeon: Danie Binder, MD;  [facility];  Service: Endoscopy;  Laterality: N/A;  2:00   COLONOSCOPY WITH PROPOFOL N/A 06/18/2020   Procedure: COLONOSCOPY WITH PROPOFOL;  Surgeon: Eloise Harman, DO;  [facility];  Service: Endoscopy;  Laterality: N/A;  pm appt   ESOPHAGOGASTRODUODENOSCOPY (  EGD) WITH PROPOFOL N/A 07/30/2020   Procedure: ESOPHAGOGASTRODUODENOSCOPY (EGD) WITH PROPOFOL;  Surgeon: Eloise Harman, DO;  [facility];  Service: Endoscopy;  Laterality: N/A;  11:45am   LEFT HEART CATHETERIZATION WITH CORONARY ANGIOGRAM N/A 02/01/2013   Procedure: LEFT HEART CATHETERIZATION WITH CORONARY ANGIOGRAM;  Surgeon: Josue Hector, MD;  Location: [facility] CATH LAB;  Service: Cardiovascular;  Laterality:  N/A;   NECK SURGERY     POLYPECTOMY  06/18/2020   Procedure: POLYPECTOMY;  Surgeon: Eloise Harman, DO;  Location: [facility];  Service: Endoscopy;;  snare and biospy     reports that he has never smoked. He has never used smokeless tobacco. He reports current alcohol use. He reports that he does not use drugs.  Allergies  Allergen Reactions   Cymbalta [Duloxetine Hcl]     Bad dreams   Lodine [Etodolac] Nausea And Vomiting   Zoloft [Sertraline Hcl]     REDUCED URINARY FLOW,NOCTURIA    Family History  Problem Relation Age of Onset   Heart attack Father        60's    Prior to Admission medications   Medication Sig Start Date End Date Taking? Authorizing Provider  ALPRAZolam Duanne Moron) 0.5 MG tablet 1/2 tablet to 1 tablet up to 3 times a day as needed for anxiety 09/24/20  Yes Luking, Scott A, MD  amLODipine (NORVASC) 10 MG tablet Take 1 tablet (10 mg total) by mouth daily. 10/08/20  Yes Luking, Elayne Snare, MD  dextromethorphan-guaiFENesin (ROBITUSSIN-DM) 10-100 MG/5ML liquid Take by mouth.   Yes [provider]  fluticasone (FLONASE) 50 MCG/ACT nasal spray Place into the nose.   Yes [provider]  ibuprofen (ADVIL,MOTRIN) 200 MG tablet Take 800 mg by mouth every 6 (six) hours as needed for mild pain (for pain.).   Yes [provider]  losartan (COZAAR) 50 MG tablet Take 1 tablet (50 mg total) by mouth daily. 10/21/20  Yes Luking, Elayne Snare, MD  sodium chloride (OCEAN) 0.65 % nasal spray 1 spray as needed for Congestion   Yes [provider]  gabapentin (NEURONTIN) 300 MG capsule Take 2 capsules (600 mg total) by mouth 3 (three) times daily. Patient not taking: No sig reported 11/26/20   Orson Slick, MD  pravastatin (PRAVACHOL) 20 MG tablet Take 1 tablet (20 mg total) by mouth daily. Patient not taking: No sig reported 03/18/20   Kathyrn Drown, MD    Physical Exam: Vitals:   12/02/20 1107 12/02/20 1218 12/02/20 1219 12/02/20 1400  BP:    127/82 (!) 151/92  Pulse:   80 82  Resp:   18 12  Temp:      TempSrc:      SpO2:   100% 98%  Weight: 99.8 kg 99.8 kg    Height:  5\' 8"  (1.727 m)      Constitutional: Awake, alert, oriented x3, sitting on exam gurney, NAD, calm, comfortable Eyes: PERRL, lids and conjunctivae normal ENMT: Mucous membranes are moist. Posterior pharynx clear of any exudate or lesions.  Neck: normal, supple, no masses, no thyromegaly Respiratory: clear to auscultation bilaterally, no wheezing, no crackles. Normal respiratory effort. No accessory muscle use.  Cardiovascular: normal s1, s2 sounds, no murmurs / rubs / gallops. No extremity edema. 2+ pedal pulses. No carotid bruits.  Abdomen: no tenderness, no masses palpated. No hepatosplenomegaly. Bowel sounds positive.  Musculoskeletal: no clubbing / cyanosis. No joint deformity upper and lower extremities. Good ROM, no contractures. Normal muscle  tone.  Skin: no rashes, lesions, ulcers. No induration Neurologic: CN 2-12 grossly intact. Sensation intact, DTR normal. Strength 5/5 in all 4.  Psychiatric: Normal judgment and insight. Alert and oriented x 3. Normal mood.   Labs on Admission: I have personally reviewed following labs and imaging studies  CBC: Recent Labs  Lab 12/02/20 1126  WBC 10.0  HGB 15.8  HCT 44.3  MCV 86.0  PLT 938   Basic Metabolic Panel: Recent Labs  Lab 12/02/20 1127  NA 128*  K 4.1  CL 94*  CO2 24  GLUCOSE 116*  BUN 11  CREATININE 0.61  CALCIUM 9.4   GFR: Estimated Creatinine Clearance: 104.1 mL/min (by C-G formula based on SCr of 0.61 mg/dL). Liver Function Tests: Recent Labs  Lab 12/02/20 1127  AST 37  ALT 20  ALKPHOS 104  BILITOT 1.2  PROT 8.1  ALBUMIN 4.0   No results for input(s): LIPASE, AMYLASE in the last 168 hours. No results for input(s): AMMONIA in the last 168 hours. Coagulation Profile: No results for input(s): INR, PROTIME in the last 168 hours. Cardiac Enzymes: No results for input(s):  CKTOTAL, CKMB, CKMBINDEX, TROPONINI in the last 168 hours. BNP (last 3 results) No results for input(s): PROBNP in the last 8760 hours. HbA1C: No results for input(s): HGBA1C in the last 72 hours. CBG: No results for input(s): GLUCAP in the last 168 hours. Lipid Profile: No results for input(s): CHOL, HDL, LDLCALC, TRIG, CHOLHDL, LDLDIRECT in the last 72 hours. Thyroid Function Tests: No results for input(s): TSH, T4TOTAL, FREET4, T3FREE, THYROIDAB in the last 72 hours. Anemia Panel: No results for input(s): VITAMINB12, FOLATE, FERRITIN, TIBC, IRON, RETICCTPCT in the last 72 hours. Urine analysis:    Component Value Date/Time   PROTEINUR NEGATIVE 09/04/2020 1200    Radiological Exams on Admission: DG Chest 2 View  Result Date: 12/02/2020 CLINICAL DATA:  Chest pain EXAM: CHEST - 2 VIEW COMPARISON:  04/14/2011 FINDINGS: Heart size is normal. Mild tortuosity of the aorta. The lungs are clear. The pulmonary vascularity is normal. No pleural effusion. No pneumothorax. Right lateral inferior costophrenic angle is not included on the frontal view. Old healed rib fractures on the right at ribs 6, 7 and 8. Ordinary thoracic degenerative changes. Previous ACDF. IMPRESSION: No active disease. Old healed rib fractures on the right. Slightly tortuous aorta. Electronically Signed   By: Nelson Chimes M.D.   On: 12/02/2020 11:27    EKG: Independently reviewed.  NSR no acute ST-T wave abnormalities seen  Assessment/Plan Principal Problem:   NSTEMI (non-ST elevated myocardial infarction) (HCC) Active Problems:   Chest pain   Elevated troponin   Elevated CEA   HTN (hypertension)   Mixed hyperlipidemia   Anxiety   Testosterone deficiency   Hepatocellular carcinoma (HCC)   Erectile dysfunction   CAD (coronary artery disease)   SIADH (syndrome of inappropriate ADH production) (HCC)   Hyponatremia   Elevated CA 19-9 level   NSAID long-term use   NSTEMI  - pt presents with recent chest pain  symptoms and elevated HS troponin tests - admit to telemetry to start IV heparin infusion, continuous telemetry monitoring, aspirin, statin and beta-blocker - 2D echocardiogram - Cardiology consultation and following - Plan to transfer patient to [facility] for cardiac catheterization in the near future  Stage IV Hepatocellular carcinoma  - Pt is followed by Dr. Narda Rutherford  - he is being managed with immunotherapy (bevacizumab and atezolizumab) every 21 days  Hyponatremia - SIADH of malignancy -  monitor - he is totally asymptomatic from this - fluid restriction advised  Chronic myalgias - side effect of immunotherapy treatments  - he has been on gabapentin for this  - also has been taking ibuprofen 800 mg Q6 hours  - will order something for pain as needed   Hypertension - he has been started on beta blocker by cardiology and amlodipine on hold   Hyperlipidemia  - High dose statin being restarted by cardiology in setting of NSTEMI    DVT prophylaxis: IV heparin infusion   Code Status: Full   Family Communication: daughter at bedside   Disposition Plan: transfer to [facility] in next 24 hours   Consults called: cardiology   Admission status: INP   Level of care: Telemetry Irwin Brakeman MD [facility] How to contact the [facility] Attending or Consulting provider [place] or covering provider during after hours 7P -7A, for this patient?  Check the care team in [facility] and look for a) attending/consulting [facility] provider listed and b) the [facility] team listed Log into [email or web address] and use [place]'s universal password to access. If you do not have the password, please contact the hospital operator. Locate the [facility] provider you are looking for under [facility] and page to a number that you can be directly reached. If you still have difficulty reaching the provider, please page the [facility] (Director on Call) for the Hospitalists listed on amion for assistance.   If 7PM-7AM, please  contact night-coverage [email or web address] Password [facility]  12/02/2020, 3:46 PM

## 2020-12-02 NOTE — ED Notes (Signed)
Pt is sitting in the recliner due to the stretcher being uncomfortable.

## 2020-12-02 NOTE — Consult Note (Addendum)
Cardiology Consultation:   Patient ID: Logan Alvarez MRN: [id number]; DOB: Dec 25, 1955  Admit date: 12/02/2020 Date of Consult: 12/02/2020  PCP:  Kathyrn Drown, MD   [facility] Providers Cardiologist:  Jenkins Rouge, MD        Patient Profile:   Logan Alvarez is a 65 y.o. male with a hx of nonobstructive CAD on cath in 2014 who is being seen 12/02/2020 for the evaluation of chest pain and elevated troponins at the request of Dr. Laverta Baltimore.  History of Present Illness:   Logan Alvarez is a 65 year old male patient with history of cardiac cath in 2014 nonobstructive CAD treated medically, hypertension, hyperlipidemia (stopped statin back in June - was tolerating), hepatocellular carcinoma stage IV currently undergoing immunotherapy.  Having a lot of aches and pains and fatigue with this and has been taking ibuprofen regularly.  Patient comes in with 2 episodes of chest tightness.  The first episode was 2 weeks ago described as severe chest tightness that lasted 90 minutes and eased spontaneously.  Last night at 7 PM he had recurrent chest pain and EMS came out and did an EKG which was stable.  He refused to go to the emergency room.  His daughter convinced him to come to the ER today.  He has not had any chest pain since last evening.  Before both these episodes he drank a 5-hour energy drink.  He has had a lot of fatigue and overall aches and pains from his immunotherapy.  He used to be a heavy drinker but has cut back and had 4 beers yesterday.  He is a non-smoker.  Father had an MI in his 48s.  No history of diabetes.  EKG here without acute change and troponins over 3000.   Past Medical History:  Diagnosis Date   CAD (coronary artery disease)    01/2013 CTA: Ca score of 715 (92%'ile), LAD 50p, D1 50-75, RCA 50p.   Hepatocellular carcinoma ([place])    Stage IV   Hyperlipidemia    Hypertension    Testosterone deficiency 2009    Past Surgical History:  Procedure Laterality Date    BALLOON DILATION  07/30/2020   Procedure: BALLOON DILATION;  Surgeon: Eloise Harman, DO;  [facility];  Service: Endoscopy;;   COLONOSCOPY N/A 03/12/2017   Procedure: COLONOSCOPY;  Surgeon: Danie Binder, MD;  [facility];  Service: Endoscopy;  Laterality: N/A;  2:00   COLONOSCOPY WITH PROPOFOL N/A 06/18/2020   Procedure: COLONOSCOPY WITH PROPOFOL;  Surgeon: Eloise Harman, DO;  [facility];  Service: Endoscopy;  Laterality: N/A;  pm appt   ESOPHAGOGASTRODUODENOSCOPY (EGD) WITH PROPOFOL N/A 07/30/2020   Procedure: ESOPHAGOGASTRODUODENOSCOPY (EGD) WITH PROPOFOL;  Surgeon: Eloise Harman, DO;  [facility];  Service: Endoscopy;  Laterality: N/A;  11:45am   LEFT HEART CATHETERIZATION WITH CORONARY ANGIOGRAM N/A 02/01/2013   Procedure: LEFT HEART CATHETERIZATION WITH CORONARY ANGIOGRAM;  Surgeon: Josue Hector, MD;  Location: [facility] CATH LAB;  Service: Cardiovascular;  Laterality: N/A;   NECK SURGERY     POLYPECTOMY  06/18/2020   Procedure: POLYPECTOMY;  Surgeon: Eloise Harman, DO;  [facility];  Service: Endoscopy;;  snare and biospy     Outpatient Medications: No current facility-administered medications on file prior to encounter.   Current Outpatient Medications on File Prior to Encounter  Medication Sig Dispense Refill   ALPRAZolam (XANAX) 0.5 MG tablet 1/2 tablet to 1 tablet up to 3  times a day as needed for anxiety 90 tablet 5   amLODipine (NORVASC) 10 MG tablet Take 1 tablet (10 mg total) by mouth daily. 90 tablet 1   dextromethorphan-guaiFENesin (ROBITUSSIN-DM) 10-100 MG/5ML liquid Take by mouth.     fluticasone (FLONASE) 50 MCG/ACT nasal spray Place into the nose.     ibuprofen (ADVIL,MOTRIN) 200 MG tablet Take 800 mg by mouth every 6 (six) hours as needed for mild pain (for pain.).     losartan (COZAAR) 50 MG tablet Take 1 tablet (50 mg total) by mouth daily. 90 tablet 1   sodium chloride (OCEAN) 0.65 % nasal spray 1  spray as needed for Congestion     gabapentin (NEURONTIN) 300 MG capsule Take 2 capsules (600 mg total) by mouth 3 (three) times daily. (Patient not taking: No sig reported) 180 capsule 1   pravastatin (PRAVACHOL) 20 MG tablet Take 1 tablet (20 mg total) by mouth daily. (Patient not taking: No sig reported) 90 tablet 1      Allergies:    Allergies  Allergen Reactions   Cymbalta [Duloxetine Hcl]     Bad dreams   Lodine [Etodolac] Nausea And Vomiting   Zoloft [Sertraline Hcl]     REDUCED URINARY FLOW,NOCTURIA    Social History:   Social History   Tobacco Use   Smoking status: Never   Smokeless tobacco: Never  Substance Use Topics   Alcohol use: Yes    Comment: 4-6 beers a day     Family History:     Family History  Problem Relation Age of Onset   Heart attack Father        55's     ROS:  Please see the history of present illness.  Review of Systems  Constitutional: Positive for malaise/fatigue.  HENT: Negative.    Eyes: Negative.   Cardiovascular:  Positive for chest pain.  Respiratory: Negative.    Endocrine: Negative.   Hematologic/Lymphatic: Negative.   Musculoskeletal:  Positive for muscle weakness and stiffness. Negative for joint pain.  Gastrointestinal: Negative.   Genitourinary: Negative.   Neurological:  Positive for weakness.   All other ROS reviewed and negative.     Physical Exam/Data:   Vitals:   12/02/20 1107 12/02/20 1218 12/02/20 1219 12/02/20 1400  BP:   127/82 (!) 151/92  Pulse:   80 82  Resp:   18 12  Temp:      TempSrc:      SpO2:   100% 98%  Weight: 99.8 kg 99.8 kg    Height:  5\' 8"  (1.727 m)     No intake or output data in the 24 hours ending 12/02/20 1452 Last 3 Weights 12/02/2020 12/02/2020 11/25/2020  Weight (lbs) 220 lb 220 lb 0.3 oz 218 lb  Weight (kg) 99.791 kg 99.8 kg 98.884 kg     Body mass index is 33.45 kg/m.  General:  Obese, in no acute distress  HEENT: normal Neck: no JVD Vascular: No carotid bruits; Distal  pulses 2+ bilaterally Cardiac:  normal S1, S2; RRR; no murmur   Lungs:  clear to auscultation bilaterally, no wheezing, rhonchi or rales  Abd: soft, nontender, no hepatomegaly  Ext: no edema Musculoskeletal:  No deformities, BUE and BLE strength normal and equal Skin: warm and dry  Neuro:  CNs 2-12 intact, no focal abnormalities noted Psych:  Normal affect   EKG:  The EKG was personally reviewed and demonstrates:  NSR, normal EKG Telemetry:  Telemetry was personally reviewed and demonstrates:  NSR  Relevant CV Studies: Cardiac cath 2014 Coronary Arteries: Right dominant with no anomalies   LM: Normal  LAD: 30% mid and 40% distal                          D1- Small artery less than 40mm with branching  80% diffuse proximally in tubular fashion Superior branch also with 70% disease              D2- Small and normal   Circumflex: 40% mid vessel disease             OM1- Normal              RCA:  20% proximal and mid vessel stenosis              PDA- Normal             PLB- Normal   Ventriculography: EF: 60 %, no RWMA;s   Hemodynamics:             Aortic Pressure: 123 83 mmHg             LV Pressure: 126 16  mmHg     Final Conclusions:  Single branch vessel disease Reviewed films with Dr Martinique D1 too small (less than 79mm) with branch point  Would not do well with stenting Medical Rx   Recommendations:  Medical Rx    Jenkins Rouge 02/01/2013, 9:24 AM  Laboratory Data:  High Sensitivity Troponin:   Recent Labs  Lab 12/02/20 1126 12/02/20 1313  TROPONINIHS 3,068* 2,916*     Chemistry Recent Labs  Lab 12/02/20 1127  NA 128*  K 4.1  CL 94*  CO2 24  GLUCOSE 116*  BUN 11  CREATININE 0.61  CALCIUM 9.4  GFRNONAA >60  ANIONGAP 10    Recent Labs  Lab 12/02/20 1127  PROT 8.1  ALBUMIN 4.0  AST 37  ALT 20  ALKPHOS 104  BILITOT 1.2    Hematology Recent Labs  Lab 12/02/20 1126  WBC 10.0  RBC 5.15  HGB 15.8  HCT 44.3  MCV 86.0  MCH 30.7  MCHC 35.7   RDW 14.1  PLT 306    Radiology/Studies:  DG Chest 2 View  Result Date: 12/02/2020 CLINICAL DATA:  Chest pain EXAM: CHEST - 2 VIEW COMPARISON:  04/14/2011 FINDINGS: Heart size is normal. Mild tortuosity of the aorta. The lungs are clear. The pulmonary vascularity is normal. No pleural effusion. No pneumothorax. Right lateral inferior costophrenic angle is not included on the frontal view. Old healed rib fractures on the right at ribs 6, 7 and 8. Ordinary thoracic degenerative changes. Previous ACDF. IMPRESSION: No active disease. Old healed rib fractures on the right. Slightly tortuous aorta. Electronically Signed   By: Nelson Chimes M.D.   On: 12/02/2020 11:27     Assessment and Plan:   Chest pain last night lasting 90 min now pain free but Troponins over 3000, EKG without acute change, similar episode 2 weeks ago. Cath 2014 with 90% D1, 40%Cfx, 30 & 40% mid and distal LAD. Dr. Domenic Polite spoke with oncology who say no contraindication for him to be on IV heparin and dual antiplatelet meds. Will have hospitalist team admit here as there are no beds at [facility] and patient is pain free. Transfer for cath in am. Start IV heparin but wait until am to start ASA as he has been taking a lot of ibuprofen I have reviewed  the risks, indications, and alternatives to angioplasty and stenting with the patient. Risks include but are not limited to bleeding, infection, vascular injury, stroke, myocardial infection, arrhythmia, kidney injury, radiation-related injury in the case of prolonged fluoroscopy use, emergency cardiac surgery, and death. The patient understands the risks of serious complication is low (<1%) and patient agrees to proceed.   HTN controlled on losartan and amlodipine at home. Will hold off on amlodipine as we ordered a beta blocker  HLD-stopped pravachol in June.  Need to resume statin therapy.  Hepatocellular CA stage IV on atezolizumab/bevaizumab.   Risk Assessment/Risk Scores:     TIMI  Risk Score for Unstable Angina or Non-ST Elevation MI:   The patient's TIMI risk score is  , which indicates a  % risk of all cause mortality, new or recurrent myocardial infarction or need for urgent revascularization in the next 14 days.       For questions or updates, please contact [place] Please consult [email or web address] for contact info under    Signed, Ermalinda Barrios, PA-C  12/02/2020 2:52 PM    Attending note:  Patient seen and examined.  I reviewed his records and discussed the case with Ms. Bonnell Public PA-C.  I agree with her above findings.  Logan Alvarez presents after an episode of prolonged severe chest pain that occurred at rest yesterday evening (he did not transport for evaluation via EMS yesterday, his daughter encouraged him to get checked out today).  He had experienced a similar episode 2 weeks ago but did not seek medical attention.  In the meanwhile he has been experiencing diffuse achiness and shoulder pains, but in the setting of hepatocellular carcinoma that is stage IV and undergoing treatment with atezolizumab and bevaizumab.  He has a history of nonobstructive CAD as of cardiac catheterization in 2014 when he was evaluated by Dr. Johnsie Cancel.  On examination he is in no distress, no active chest pain.  He is afebrile, heart rate is in the 80s in sinus rhythm by telemetry which I personally reviewed, systolic blood pressure 610R to 150s.  Lungs are clear.  Cardiac exam with RRR no gallop or rub.  Pertinent lab work includes sodium 128, potassium 4.1, BUN 11, creatinine 0.61, high-sensitivity troponin I 3068, CRP 33, hemoglobin 15.8, platelets 306.  I personally reviewed his ECG which shows normal sinus rhythm.  Chest x-ray reports old healed rib fractures on the right with no acute findings.  Recurrent chest pain with enzymatic evidence of NSTEMI, ECG shows no acute ST segment changes.  Had significant episode of chest pain at rest yesterday, pain-free today.  Discussed case  with Dr. Delton Coombes in the absence of the patient's regular oncologist.  No contraindication for patient to be managed for ACS with heparin and if needed dual antiplatelet therapy as it relates to his current chemotherapy regimen.  No beds available at [facility] at this time.  He will be admitted to the hospitalist team, starting IV heparin and initiate aspirin.  Resume losartan and start low-dose Lopressor.  Start high intensity statin for now (he previously tolerated Pravachol).  Transfer to [facility] tomorrow for diagnostic cardiac catheterization and assessment for revascularization options.  We will also need echocardiogram.  Satira Sark, M.D., F.A.C.C.

## 2020-12-02 NOTE — ED Provider Notes (Signed)
[facility] EMERGENCY DEPARTMENT Provider Note   CSN: [id number] Arrival date & time: 12/02/20  1043     History Chief Complaint  Patient presents with   Chest Pain    Logan Alvarez is a 65 y.o. male.  The history is provided by the patient.  Chest Pain Pain location:  L chest Pain quality: aching   Pain radiates to:  Does not radiate Pain severity:  No pain Onset quality:  Gradual Duration:  1 day Timing:  Intermittent Progression:  Resolved Chronicity:  New Relieved by:  Nothing Worsened by:  Nothing Ineffective treatments:  None tried Associated symptoms: no nausea   Risk factors: coronary artery disease, high cholesterol and hypertension   Pt reports chest pain started last pm.  Pt had a similar episode 4 days ago.  Pt has a history of liver cancer and is currently in treatment    Past Medical History:  Diagnosis Date   Cancer ([place]) 2020   liver tumor removed   Chest pain    a. 2008: Normal ETT;  b. 01/2013 CTA: Ca score of 715 (92%'ile), LAD 50p, D1 50-75, RCA 50p.   Hyperlipidemia    Hypertension    Testosterone deficiency 2009    Patient Active Problem List   Diagnosis Date Noted   Erectile dysfunction 11/11/2018   Hepatocellular carcinoma ([place]) 11/12/2017   Special screening for malignant neoplasms, colon    Closed intra-articular fracture of right calcaneus, sequela 03/03/2017   Arthritis of midtarsal joint of right foot 03/03/2017   Testosterone deficiency 05/27/2012   Chest pain 04/15/2011   HTN (hypertension) 04/15/2011   Mixed hyperlipidemia 04/15/2011   Anxiety 04/15/2011    Past Surgical History:  Procedure Laterality Date   BALLOON DILATION  07/30/2020   Procedure: BALLOON DILATION;  Surgeon: Eloise Harman, DO;  [facility];  Service: Endoscopy;;   COLONOSCOPY N/A 03/12/2017   Procedure: COLONOSCOPY;  Surgeon: Danie Binder, MD;  [facility];  Service: Endoscopy;  Laterality: N/A;  2:00   COLONOSCOPY WITH  PROPOFOL N/A 06/18/2020   Procedure: COLONOSCOPY WITH PROPOFOL;  Surgeon: Eloise Harman, DO;  [facility];  Service: Endoscopy;  Laterality: N/A;  pm appt   ESOPHAGOGASTRODUODENOSCOPY (EGD) WITH PROPOFOL N/A 07/30/2020   Procedure: ESOPHAGOGASTRODUODENOSCOPY (EGD) WITH PROPOFOL;  Surgeon: Eloise Harman, DO;  [facility];  Service: Endoscopy;  Laterality: N/A;  11:45am   LEFT HEART CATHETERIZATION WITH CORONARY ANGIOGRAM N/A 02/01/2013   Procedure: LEFT HEART CATHETERIZATION WITH CORONARY ANGIOGRAM;  Surgeon: Josue Hector, MD;  Location: [facility] CATH LAB;  Service: Cardiovascular;  Laterality: N/A;   NECK SURGERY     POLYPECTOMY  06/18/2020   Procedure: POLYPECTOMY;  Surgeon: Eloise Harman, DO;  [facility];  Service: Endoscopy;;  snare and biospy       Family History  Problem Relation Age of Onset   Heart attack Father        3's    Social History   Tobacco Use   Smoking status: Never   Smokeless tobacco: Never  Vaping Use   Vaping Use: Never used  Substance Use Topics   Alcohol use: Yes    Comment: 4-6 beers a day   Drug use: No    Home Medications Prior to Admission medications   Medication Sig Start Date End Date Taking? Authorizing Provider  ALPRAZolam Duanne Moron) 0.5 MG tablet 1/2 tablet to 1 tablet up to 3 times a day  as needed for anxiety 09/24/20   Kathyrn Drown, MD  amLODipine (NORVASC) 10 MG tablet Take 1 tablet (10 mg total) by mouth daily. 10/08/20   Kathyrn Drown, MD  dextromethorphan-guaiFENesin (ROBITUSSIN-DM) 10-100 MG/5ML liquid Take by mouth.    [provider]  fluticasone (FLONASE) 50 MCG/ACT nasal spray Place into the nose.    [provider]  gabapentin (NEURONTIN) 300 MG capsule Take 2 capsules (600 mg total) by mouth 3 (three) times daily. 11/26/20   Orson Slick, MD  ibuprofen (ADVIL,MOTRIN) 200 MG tablet Take 400 mg by mouth every 8 (eight) hours as needed (for pain.).    [provider]  losartan (COZAAR) 50 MG tablet Take 1 tablet (50 mg total) by mouth daily. 10/21/20   Kathyrn Drown, MD  pravastatin (PRAVACHOL) 20 MG tablet Take 1 tablet (20 mg total) by mouth daily. 03/18/20   Kathyrn Drown, MD  sodium chloride (OCEAN) 0.65 % nasal spray 1 spray as needed for Congestion    [provider]    Allergies    Cymbalta [duloxetine hcl], Lodine [etodolac], and Zoloft [sertraline hcl]  Review of Systems   Review of Systems  Cardiovascular:  Positive for chest pain.  Gastrointestinal:  Negative for nausea.  All other systems reviewed and are negative.  Physical Exam Updated Vital Signs BP (!) 155/82 (BP Location: Right Arm)   Pulse 81   Temp 97.7 F (36.5 C) (Oral)   Resp 18   Wt 99.8 kg   SpO2 100%   BMI 33.45 kg/m   Physical Exam Vitals and nursing note reviewed.  Constitutional:      Appearance: He is well-developed.  HENT:     Head: Normocephalic and atraumatic.  Eyes:     Conjunctiva/sclera: Conjunctivae normal.  Cardiovascular:     Rate and Rhythm: Normal rate and regular rhythm.     Heart sounds: Normal heart sounds. No murmur heard. Pulmonary:     Effort: Pulmonary effort is normal. No respiratory distress.     Breath sounds: Normal breath sounds.  Abdominal:     Palpations: Abdomen is soft.     Tenderness: There is no abdominal tenderness.  Musculoskeletal:        General: Normal range of motion.     Cervical back: Neck supple.  Skin:    General: Skin is warm and dry.  Neurological:     Mental Status: He is alert.    ED Results / Procedures / Treatments   Labs (all labs ordered are listed, but only abnormal results are displayed) Labs Reviewed  COMPREHENSIVE METABOLIC PANEL - Abnormal; Notable for the following components:      Result Value   Sodium 128 (*)    Chloride 94 (*)    Glucose, Bld 116 (*)    All other components within normal limits  TROPONIN I (HIGH SENSITIVITY) - Abnormal; Notable for the  following components:   Troponin I (High Sensitivity) 3,068 (*)    All other components within normal limits  CBC    EKG EKG Interpretation  Date/Time:  Monday December 02 2020 11:03:20 EDT Ventricular Rate:  79 PR Interval:  158 QRS Duration: 88 QT Interval:  376 QTC Calculation: 431 R Axis:   54 Text Interpretation: Normal sinus rhythm Normal ECG Confirmed by Nanda Quinton [phone]) on 12/02/2020 11:58:35 AM  Radiology DG Chest 2 View  Result Date: 12/02/2020 CLINICAL DATA:  Chest pain EXAM: CHEST - 2 VIEW COMPARISON:  04/14/2011  FINDINGS: Heart size is normal. Mild tortuosity of the aorta. The lungs are clear. The pulmonary vascularity is normal. No pleural effusion. No pneumothorax. Right lateral inferior costophrenic angle is not included on the frontal view. Old healed rib fractures on the right at ribs 6, 7 and 8. Ordinary thoracic degenerative changes. Previous ACDF. IMPRESSION: No active disease. Old healed rib fractures on the right. Slightly tortuous aorta. Electronically Signed   By: Nelson Chimes M.D.   On: 12/02/2020 11:27    Procedures Procedures   Medications Ordered in ED Medications - No data to display  ED Course  I have reviewed the triage vital signs and the nursing notes.  Pertinent labs & imaging results that were available during my care of the patient were reviewed by me and considered in my medical decision making (see chart for details).    MDM Rules/Calculators/A&P                           MDM:  Cardiac evaltuion ordered, EKG no acute  Chest no acute abnormality  Troponin is over 3000.  I reviewed and interpreted results.  Results discussed with pt.   I spoke to Dr. Domenic Polite cardiology who will see pt here and evaluate.  He request hospitalist admit.  Pt will have a cardiac cath tomorrow  Final Clinical Impression(s) / ED Diagnoses Final diagnoses:  NSTEMI (non-ST elevated myocardial infarction) [facility])    Rx / [place] Orders ED Discharge Orders      None        Fransico Meadow, [place] 12/02/20 1448    Margette Fast, MD 12/08/20 [phone]

## 2020-12-02 NOTE — Telephone Encounter (Signed)
Received call from pt stating that he experienced chest pain and tightness over the weekend. EMS came and did an EKG and was told it was normal. He was not transported to the ED. He spoke with his PCP and his PCP advised that he go to the ED to be evaluated. Pt asked if his treatment for liver cancer could cause a heart attack. Advise that though one of his meds can increase his BP, it would be uncommon to cause an MI. Advised to follow his PCP's advise for further evaluation.  Pt is in agreement.

## 2020-12-02 NOTE — Telephone Encounter (Signed)
Patient called and stated yesterday he was having chest pains and called EMS. Patient states EMS told him he could be having a heart attack and he needed to call his doctor in the morning to have blood work to see if he had a heart attack. Consult with Dr Nicki Reaper:  Patient needs to go to the ER for evaluation and treatment. Patient notified and verbalized understanding.

## 2020-12-02 NOTE — ED Notes (Signed)
Date and time results received: 12/02/20 12:08 PM    Test: troponin Critical Value: 3068  Name of Provider Notified: Dr. Laverta Baltimore  Orders Received? Or Actions Taken?: see orders

## 2020-12-03 ENCOUNTER — Inpatient Hospital Stay (HOSPITAL_COMMUNITY): Payer: BC Managed Care – PPO

## 2020-12-03 ENCOUNTER — Encounter (HOSPITAL_COMMUNITY)
Admission: EM | Disposition: A | Discharge status: Home / Self Care | Payer: Self-pay | Source: Home / Self Care | Attending: Family Medicine

## 2020-12-03 DIAGNOSIS — R978 Other abnormal tumor markers: Secondary | ICD-10-CM

## 2020-12-03 DIAGNOSIS — E871 Hypo-osmolality and hyponatremia: Secondary | ICD-10-CM

## 2020-12-03 DIAGNOSIS — I214 Non-ST elevation (NSTEMI) myocardial infarction: Secondary | ICD-10-CM | POA: Diagnosis not present

## 2020-12-03 DIAGNOSIS — I2510 Atherosclerotic heart disease of native coronary artery without angina pectoris: Secondary | ICD-10-CM

## 2020-12-03 HISTORY — PX: LEFT HEART CATH AND CORONARY ANGIOGRAPHY: CATH118249

## 2020-12-03 HISTORY — PX: CORONARY STENT INTERVENTION: CATH118234

## 2020-12-03 LAB — VITAMIN D 25 HYDROXY (VIT D DEFICIENCY, FRACTURES): Vit D, 25-Hydroxy: 13.91 ng/mL — ABNORMAL LOW (ref 30–100)

## 2020-12-03 LAB — ECHOCARDIOGRAM COMPLETE
AR max vel: 2.52 cm2
AV Area VTI: 2.38 cm2
AV Area mean vel: 2.33 cm2
AV Mean grad: 4 mmHg
AV Peak grad: 6.1 mmHg
Ao pk vel: 1.23 m/s
Area-P 1/2: 4.04 cm2
Calc EF: 54.8 %
Height: 68 in
MV VTI: 4.72 cm2
S' Lateral: 3.4 cm
Single Plane A2C EF: 52.4 %
Single Plane A4C EF: 55 %
Weight: 3520 oz

## 2020-12-03 LAB — POCT ACTIVATED CLOTTING TIME: Activated Clotting Time: 271 seconds

## 2020-12-03 LAB — COMPREHENSIVE METABOLIC PANEL
ALT: 17 U/L (ref 0–44)
AST: 28 U/L (ref 15–41)
Albumin: 3.7 g/dL (ref 3.5–5.0)
Alkaline Phosphatase: 103 U/L (ref 38–126)
Anion gap: 8 (ref 5–15)
BUN: 10 mg/dL (ref 8–23)
CO2: 26 mmol/L (ref 22–32)
Calcium: 9.3 mg/dL (ref 8.9–10.3)
Chloride: 95 mmol/L — ABNORMAL LOW (ref 98–111)
Creatinine, Ser: 0.7 mg/dL (ref 0.61–1.24)
GFR, Estimated: >60 mL/min (ref >60)
Glucose, Bld: 117 mg/dL — ABNORMAL HIGH (ref 70–99)
Potassium: 4.3 mmol/L (ref 3.5–5.1)
Sodium: 129 mmol/L — ABNORMAL LOW (ref 135–145)
Total Bilirubin: 1.5 mg/dL — ABNORMAL HIGH (ref 0.3–1.2)
Total Protein: 7.6 g/dL (ref 6.5–8.1)

## 2020-12-03 LAB — CBC
HCT: 41.6 % (ref 39.0–52.0)
Hemoglobin: 14.1 g/dL (ref 13.0–17.0)
MCH: 29.5 pg (ref 26.0–34.0)
MCHC: 33.9 g/dL (ref 30.0–36.0)
MCV: 87 fL (ref 80.0–100.0)
Platelets: 375 10*3/uL (ref 150–400)
RBC: 4.78 MIL/uL (ref 4.22–5.81)
RDW: 14.3 % (ref 11.5–15.5)
WBC: 10.7 10*3/uL — ABNORMAL HIGH (ref 4.0–10.5)
nRBC: 0 % (ref 0.0–0.2)

## 2020-12-03 LAB — HEPARIN LEVEL (UNFRACTIONATED)
Heparin Unfractionated: 0.25 IU/mL — ABNORMAL LOW (ref 0.30–0.70)
Heparin Unfractionated: 0.26 IU/mL — ABNORMAL LOW (ref 0.30–0.70)

## 2020-12-03 LAB — LIPID PANEL
Cholesterol: 158 mg/dL (ref 0–200)
HDL: 36 mg/dL — ABNORMAL LOW (ref >40)
LDL Cholesterol: 99 mg/dL (ref 0–99)
Total CHOL/HDL Ratio: 4.4 RATIO
Triglycerides: 116 mg/dL (ref <150)
VLDL: 23 mg/dL (ref 0–40)

## 2020-12-03 LAB — TROPONIN I (HIGH SENSITIVITY): Troponin I (High Sensitivity): 1244 ng/L (ref <18)

## 2020-12-03 LAB — HIV ANTIBODY (ROUTINE TESTING W REFLEX): HIV Screen 4th Generation wRfx: NONREACTIVE

## 2020-12-03 LAB — MAGNESIUM: Magnesium: 2.3 mg/dL (ref 1.7–2.4)

## 2020-12-03 SURGERY — LEFT HEART CATH AND CORONARY ANGIOGRAPHY
Anesthesia: LOCAL

## 2020-12-03 MED ORDER — HEPARIN (PORCINE) IN NACL 1000-0.9 UT/500ML-% IV SOLN
INTRAVENOUS | Status: AC
  Filled 2020-12-03: qty 500

## 2020-12-03 MED ORDER — TICAGRELOR 90 MG PO TABS
ORAL_TABLET | ORAL | Status: AC
  Filled 2020-12-03: qty 1

## 2020-12-03 MED ORDER — FENTANYL CITRATE PF 50 MCG/ML IJ SOSY
50.0000 ug | PREFILLED_SYRINGE | Freq: Once | INTRAMUSCULAR | Status: AC
  Administered 2020-12-03: 50 ug via INTRAVENOUS

## 2020-12-03 MED ORDER — TICAGRELOR 90 MG PO TABS
90.0000 mg | ORAL_TABLET | Freq: Two times a day (BID) | ORAL | Status: DC
  Administered 2020-12-03 – 2020-12-04 (×2): 90 mg via ORAL
  Filled 2020-12-03 (×2): qty 1

## 2020-12-03 MED ORDER — HEPARIN (PORCINE) IN NACL 1000-0.9 UT/500ML-% IV SOLN
INTRAVENOUS | Status: DC | PRN
  Administered 2020-12-03 (×2): 500 mL

## 2020-12-03 MED ORDER — MIDAZOLAM HCL 2 MG/2ML IJ SOLN
INTRAMUSCULAR | Status: AC
  Filled 2020-12-03: qty 2

## 2020-12-03 MED ORDER — HEPARIN BOLUS VIA INFUSION
1000.0000 [IU] | Freq: Once | INTRAVENOUS | Status: AC
  Administered 2020-12-03: 1000 [IU] via INTRAVENOUS

## 2020-12-03 MED ORDER — IOHEXOL 350 MG/ML SOLN
INTRAVENOUS | Status: DC | PRN
  Administered 2020-12-03: 100 mL

## 2020-12-03 MED ORDER — MIDAZOLAM HCL 2 MG/2ML IJ SOLN
INTRAMUSCULAR | Status: DC | PRN
  Administered 2020-12-03: 2 mg via INTRAVENOUS
  Administered 2020-12-03: 1 mg via INTRAVENOUS

## 2020-12-03 MED ORDER — ACETAMINOPHEN 325 MG PO TABS
650.0000 mg | ORAL_TABLET | ORAL | Status: DC | PRN
  Administered 2020-12-04: 650 mg via ORAL
  Filled 2020-12-03: qty 2

## 2020-12-03 MED ORDER — LABETALOL HCL 5 MG/ML IV SOLN: 10.0000 mg | INTRAVENOUS | Status: AC | PRN

## 2020-12-03 MED ORDER — HYDRALAZINE HCL 20 MG/ML IJ SOLN: 10.0000 mg | INTRAMUSCULAR | Status: AC | PRN

## 2020-12-03 MED ORDER — LIDOCAINE HCL (PF) 1 % IJ SOLN
INTRAMUSCULAR | Status: AC
  Filled 2020-12-03: qty 30

## 2020-12-03 MED ORDER — FENTANYL CITRATE (PF) 100 MCG/2ML IJ SOLN
INTRAMUSCULAR | Status: AC
  Filled 2020-12-03: qty 2

## 2020-12-03 MED ORDER — LIDOCAINE HCL (PF) 1 % IJ SOLN
INTRAMUSCULAR | Status: DC | PRN
  Administered 2020-12-03: 2 mL via INTRADERMAL

## 2020-12-03 MED ORDER — NITROGLYCERIN 1 MG/10 ML FOR IR/CATH LAB
INTRA_ARTERIAL | Status: DC | PRN
  Administered 2020-12-03: 200 ug via INTRACORONARY
  Administered 2020-12-03: 400 ug via INTRA_ARTERIAL

## 2020-12-03 MED ORDER — SODIUM CHLORIDE 0.9 % IV SOLN: INTRAVENOUS | Status: AC

## 2020-12-03 MED ORDER — VERAPAMIL HCL 2.5 MG/ML IV SOLN
INTRAVENOUS | Status: DC | PRN
  Administered 2020-12-03: 10 mL via INTRA_ARTERIAL

## 2020-12-03 MED ORDER — FENTANYL CITRATE (PF) 100 MCG/2ML IJ SOLN
INTRAMUSCULAR | Status: DC | PRN
  Administered 2020-12-03 (×3): 25 ug via INTRAVENOUS

## 2020-12-03 MED ORDER — ASPIRIN 81 MG PO CHEW: 81.0000 mg | CHEWABLE_TABLET | Freq: Every day | ORAL | Status: DC

## 2020-12-03 MED ORDER — VERAPAMIL HCL 2.5 MG/ML IV SOLN
INTRAVENOUS | Status: AC
  Filled 2020-12-03: qty 2

## 2020-12-03 MED ORDER — HEPARIN SODIUM (PORCINE) 1000 UNIT/ML IJ SOLN
INTRAMUSCULAR | Status: DC | PRN
  Administered 2020-12-03: 5000 [IU] via INTRAVENOUS
  Administered 2020-12-03: 2000 [IU] via INTRAVENOUS
  Administered 2020-12-03: 7000 [IU] via INTRAVENOUS

## 2020-12-03 MED ORDER — SODIUM CHLORIDE 0.9% FLUSH: 3.0000 mL | INTRAVENOUS | Status: DC | PRN

## 2020-12-03 MED ORDER — OXYCODONE HCL 5 MG PO TABS
ORAL_TABLET | ORAL | Status: AC
  Filled 2020-12-03: qty 1

## 2020-12-03 MED ORDER — HEPARIN SODIUM (PORCINE) 1000 UNIT/ML IJ SOLN
INTRAMUSCULAR | Status: AC
  Filled 2020-12-03: qty 1

## 2020-12-03 MED ORDER — SODIUM CHLORIDE 0.9 % IV SOLN
250.0000 mL | INTRAVENOUS | Status: DC | PRN
  Administered 2020-12-03: 250 mL via INTRAVENOUS

## 2020-12-03 MED ORDER — ONDANSETRON HCL 4 MG/2ML IJ SOLN: 4.0000 mg | Freq: Four times a day (QID) | INTRAMUSCULAR | Status: DC | PRN

## 2020-12-03 MED ORDER — TICAGRELOR 90 MG PO TABS
ORAL_TABLET | ORAL | Status: DC | PRN
  Administered 2020-12-03: 180 mg via ORAL

## 2020-12-03 MED ORDER — ENSURE ENLIVE PO LIQD
237.0000 mL | Freq: Two times a day (BID) | ORAL | Status: DC
  Administered 2020-12-04: 237 mL via ORAL

## 2020-12-03 MED ORDER — SODIUM CHLORIDE 0.9% FLUSH: 3.0000 mL | Freq: Two times a day (BID) | INTRAVENOUS | Status: DC

## 2020-12-03 SURGICAL SUPPLY — 18 items
BALLN SAPPHIRE 3.0X15 (BALLOONS) ×2
BALLN SAPPHIRE ~~LOC~~ 4.0X15 (BALLOONS) ×1 IMPLANT
BALLOON SAPPHIRE 3.0X15 (BALLOONS) IMPLANT
CATH 5FR JL3.5 JR4 ANG PIG MP (CATHETERS) ×1 IMPLANT
CATH LAUNCHER 6FR EBU 3.75 (CATHETERS) ×1 IMPLANT
DEVICE RAD COMP TR BAND LRG (VASCULAR PRODUCTS) ×1 IMPLANT
GLIDESHEATH SLEND SS 6F .021 (SHEATH) ×2 IMPLANT
GUIDEWIRE INQWIRE 1.5J.035X260 (WIRE) IMPLANT
INQWIRE 1.5J .035X260CM (WIRE) ×2
KIT ENCORE 26 ADVANTAGE (KITS) ×1 IMPLANT
KIT HEART LEFT (KITS) ×2 IMPLANT
KIT HEMO VALVE WATCHDOG (MISCELLANEOUS) ×1 IMPLANT
PACK CARDIAC CATHETERIZATION (CUSTOM PROCEDURE TRAY) ×2 IMPLANT
STENT ONYX FRONTIER 3.5X22 (Permanent Stent) ×1 IMPLANT
SYR MEDRAD MARK 7 150ML (SYRINGE) ×2 IMPLANT
TRANSDUCER W/STOPCOCK (MISCELLANEOUS) ×2 IMPLANT
TUBING CIL FLEX 10 FLL-RA (TUBING) ×2 IMPLANT
WIRE ASAHI PROWATER 180CM (WIRE) ×1 IMPLANT

## 2020-12-03 NOTE — Progress Notes (Addendum)
Progress Note  Patient Name: Logan Alvarez Date of Encounter: 12/03/2020  [facility] Cardiologist: Jenkins Rouge, MD    Subjective   Uncomfortable in bed. Chronic pain from immunotherapy. No chest pain  Inpatient Medications    Scheduled Meds:  aspirin EC  81 mg Oral Daily   aspirin  325 mg Oral Once   atorvastatin  80 mg Oral Q0600   losartan  50 mg Oral Daily   metoprolol tartrate  12.5 mg Oral BID   pantoprazole  40 mg Oral Daily   sodium chloride flush  3 mL Intravenous Q12H   Continuous Infusions:  sodium chloride     sodium chloride 1 mL/kg/hr (12/03/20 0631)   heparin 1,450 Units/hr (12/03/20 0845)   PRN Meds: sodium chloride, ALPRAZolam, fentaNYL (SUBLIMAZE) injection, fluticasone, ondansetron **OR** ondansetron (ZOFRAN) IV, oxyCODONE, sodium chloride, sodium chloride flush   Vital Signs    Vitals:   12/03/20 0200 12/03/20 0400 12/03/20 0600 12/03/20 0700  BP: (!) 142/87 124/80 138/88 125/79  Pulse: 75 76  76  Resp: 13 12 12 11   Temp:      TempSrc:      SpO2: 95% 97%  98%  Weight:      Height:       No intake or output data in the 24 hours ending 12/03/20 0921 Last 3 Weights 12/02/2020 12/02/2020 11/25/2020  Weight (lbs) 220 lb 220 lb 0.3 oz 218 lb  Weight (kg) 99.791 kg 99.8 kg 98.884 kg      Telemetry    NSR - Personally Reviewed  ECG    Tracing from 12/02/2020 shows normal sinus rhythm.  Physical Exam    GEN: No acute distress.   Neck: No JVD Cardiac: RRR, no murmurs, rubs, or gallops.  Respiratory: Clear to auscultation bilaterally. GI: Soft, nontender, non-distended  MS: No edema; No deformity. Neuro:  Nonfocal  Psych: Normal affect   Labs    High Sensitivity Troponin:   Recent Labs  Lab 12/02/20 1126 12/02/20 1313  TROPONINIHS 3,068* 2,916*     Chemistry Recent Labs  Lab 12/02/20 1127 12/03/20 0433  NA 128* 129*  K 4.1 4.3  CL 94* 95*  CO2 24 26  GLUCOSE 116* 117*  BUN 11 10  CREATININE 0.61 0.70  CALCIUM  9.4 9.3  MG  --  2.3  PROT 8.1 7.6  ALBUMIN 4.0 3.7  AST 37 28  ALT 20 17  ALKPHOS 104 103  BILITOT 1.2 1.5*  GFRNONAA >60 >60  ANIONGAP 10 8    Lipids  Recent Labs  Lab 12/03/20 0433  CHOL 158  TRIG 116  HDL 36*  LDLCALC 99  CHOLHDL 4.4    Hematology Recent Labs  Lab 12/02/20 1126 12/03/20 0433  WBC 10.0 10.7*  RBC 5.15 4.78  HGB 15.8 14.1  HCT 44.3 41.6  MCV 86.0 87.0  MCH 30.7 29.5  MCHC 35.7 33.9  RDW 14.1 14.3  PLT 306 375   Thyroid  Recent Labs  Lab 12/02/20 1126  TSH 5.299*     Radiology    DG Chest 2 View  Result Date: 12/02/2020 CLINICAL DATA:  Chest pain EXAM: CHEST - 2 VIEW COMPARISON:  04/14/2011 FINDINGS: Heart size is normal. Mild tortuosity of the aorta. The lungs are clear. The pulmonary vascularity is normal. No pleural effusion. No pneumothorax. Right lateral inferior costophrenic angle is not included on the frontal view. Old healed rib fractures on the right at ribs 6, 7 and 8. Ordinary thoracic  degenerative changes. Previous ACDF. IMPRESSION: No active disease. Old healed rib fractures on the right. Slightly tortuous aorta. Electronically Signed   By: Nelson Chimes M.D.   On: 12/02/2020 11:27    Cardiac Studies   Echocardiogram pending.  Patient Profile     65 y.o. male  with a hx of nonobstructive CAD on cath in 2014 who is being seen 12/02/2020 for the evaluation of chest pain and elevated troponins at the request of Dr. Laverta Baltimore.   Assessment & Plan    Recurrent episodes of chest pain, now pain free but Troponins 3000 range, EKG without acute change, similar episode 2 weeks ago. Cath 2014 with 90% D1, 40%Cfx, 30 & 40% mid and distal LAD. Dr. Domenic Polite spoke with oncology who say no contraindication for him to be on IV heparin and dual antiplatelet meds. Transfer for cath today at noon. I have reviewed the risks, indications, and alternatives to angioplasty and stenting with the patient. Risks include but are not limited to bleeding,  infection, vascular injury, stroke, myocardial infection, arrhythmia, kidney injury, radiation-related injury in the case of prolonged fluoroscopy use, emergency cardiac surgery, and death. The patient understands the risks of serious complication is low (<4%) and patient agrees to proceed.    HTN controlled on losartan and amlodipine at home. Will hold off on amlodipine as we ordered a beta blocker   HLD-stopped pravachol in June.  Need to resume statin therapy.   Hepatocellular CA stage IV on atezolizumab/bevaizumab.  For questions or updates, please contact [place] Please consult [email or web address] for contact info under     Signed, Ermalinda Barrios, PA-C  12/03/2020, 9:21 AM     Attending note:  Case discussed with Ms. Vita Barley, I agree with her above findings.  Patient awaits transfer for diagnostic cardiac catheterization at [facility] around midday.  Still chest pain free.  Systolic blood pressure 540-981, heart rate in the 70s to 80s in sinus rhythm by telemetry.  Pertinent lab work includes sodium 129, potassium 4.3, BUN 10, creatinine 0.7, high-sensitivity troponin I 2916, LDL 99, WBC 10.7, hemoglobin 14.1, platelets 375.  N.p.o. for now.  Anticipate cardiac catheterization today at [facility].  Continue aspirin, losartan, Lopressor, Lipitor, and IV heparin.  If obstructive CAD present, hopefully PCI will be a reasonable option considering current comorbid illness.  If surgical disease present, would need to review overall prognosis with oncology prior to making any decisions.  Satira Sark, M.D., F.A.C.C.

## 2020-12-03 NOTE — Progress Notes (Signed)
*  PRELIMINARY RESULTS* Echocardiogram 2D Echocardiogram has been performed.  Logan Alvarez 12/03/2020, 9:33 AM

## 2020-12-03 NOTE — Interval H&P Note (Signed)
Cath Lab Visit (complete for each Cath Lab visit)  Clinical Evaluation Leading to the Procedure:   ACS: Yes.    Non-ACS:    Anginal Classification: CCS IV  Anti-ischemic medical therapy: Minimal Therapy (1 class of medications)  Non-Invasive Test Results: No non-invasive testing performed  Prior CABG: No previous CABG      History and Physical Interval Note:  12/03/2020 3:01 PM  Logan Alvarez  has presented today for surgery, with the diagnosis of nstemi.  The various methods of treatment have been discussed with the patient and family. After consideration of risks, benefits and other options for treatment, the patient has consented to  Procedure(s): LEFT HEART CATH AND CORONARY ANGIOGRAPHY (N/A) as a surgical intervention.  The patient's history has been reviewed, patient examined, no change in status, stable for surgery.  I have reviewed the patient's chart and labs.  Questions were answered to the patient's satisfaction.     Larae Grooms

## 2020-12-03 NOTE — Progress Notes (Signed)
ANTICOAGULATION CONSULT NOTE -   Pharmacy Consult for heparin Indication: chest pain/ACS  Allergies  Allergen Reactions   Cymbalta [Duloxetine Hcl]     Bad dreams   Lodine [Etodolac] Nausea And Vomiting   Zoloft [Sertraline Hcl]     REDUCED URINARY FLOW,NOCTURIA    Patient Measurements: Height: 5\' 8"  (172.7 cm) Weight: 99.8 kg (220 lb) IBW/kg (Calculated) : 68.4 Heparin Dosing Weight: 90 kg  Vital Signs: BP: 125/79 (11/01 0700) Pulse Rate: 76 (11/01 0700)  Labs: Recent Labs    12/02/20 1126 12/02/20 1127 12/02/20 1313 12/02/20 2336 12/03/20 0433 12/03/20 0700  HGB 15.8  --   --   --  14.1  --   HCT 44.3  --   --   --  41.6  --   PLT 306  --   --   --  375  --   HEPARINUNFRC  --   --   --  0.26*  --  0.25*  CREATININE  --  0.61  --   --  0.70  --   TROPONINIHS 3,068*  --  2,916*  --   --   --      Estimated Creatinine Clearance: 104.1 mL/min (by C-G formula based on SCr of 0.7 mg/dL).   Medical History: Past Medical History:  Diagnosis Date   CAD (coronary artery disease)    01/2013 CTA: Ca score of 715 (92%'ile), LAD 50p, D1 50-75, RCA 50p.   Hepatocellular carcinoma ([place])    Stage IV   Hyperlipidemia    Hypertension    Testosterone deficiency 2009    Medications:  (Not in a hospital admission)  Assessment: Pharmacy consulted to dose heparin in patient with chest pain/NSTEMI.  Patient is not on anticoagulation prior to admission.  HL 0.25- subtherapeutic  Trop 2,916 CBC WNL   Goal of Therapy:  Heparin level 0.3-0.7 units/ml Monitor platelets by anticoagulation protocol: Yes   Plan:  Rebolus heparin 1000 units x 1 Increase heparin infusion to 1450 units/hr. Check anti-Xa level in 5-8 hours and daily. Continue to monitor H&H and platelets.   Margot Ables, PharmD Clinical Pharmacist 12/03/2020 7:56 AM

## 2020-12-03 NOTE — Progress Notes (Signed)
PROGRESS NOTE   Logan Alvarez  AST:[id number] DOB: [birth year] DOA: 12/02/2020 PCP: Kathyrn Drown, MD   Chief Complaint  Patient presents with   Chest Pain   Level of care: Telemetry  Brief Admission History:  65 y.o. male with medical history significant for stage IV hepatocellular carcinoma, coronary artery disease, hyperlipidemia, hypertension, testosterone deficiency who reportedly had an episode of left-sided chest wall tightness and discomfort last night associated with an aching pain similar to an episode he had 4 days ago.  He called EMS they came out to evaluate him and reportedly he had a normal EKG and was told to follow-up with his primary care doctor in the morning.  The patient declined going to the emergency department last night.  The patient says the pain does not radiate.  It was a gradual onset and resolved spontaneously.  Not associated with nausea or vomiting.  This was a new onset of symptoms and relieved by rest and not worsened.  He has cardiac risk factors including hypertension, hyperlipidemia he had to stop his statin in June due to hepatocellular carcinoma undergoing immunotherapy.  The treatments are causing him to have a lot of joint aches and pains and he takes ibuprofen 800 mg tablets regularly to help control the symptoms.  He was noted to have elevated high-sensitivity troponin tests around 3000.  He also complained of fatigue and very low energy.  He was seen by the cardiology team in the emergency department and they have recommended that he be admitted for IV heparin with plan to transfer to [facility] for cardiac catheterization in the near future.  Assessment & Plan:   Principal Problem:   NSTEMI (non-ST elevated myocardial infarction) ([place]) Active Problems:   Chest pain   Elevated troponin   Elevated CEA   HTN (hypertension)   Mixed hyperlipidemia   Anxiety   Testosterone deficiency   Hepatocellular carcinoma (HCC)   Erectile dysfunction   CAD  (coronary artery disease)   SIADH (syndrome of inappropriate ADH production) (HCC)   Hyponatremia   Elevated CA 19-9 level   NSAID long-term use   NSTEMI  - pt presented with recent chest pain symptoms and elevated HS troponin tests - HS troponin now trending down with treatments and still no chest pain.  - admitted to telemetry, continue IV heparin infusion, continuous telemetry monitoring, aspirin, statin and beta-blocker - 2D echocardiogram pending  - Cardiology consultation and following - Plan to transfer patient to [facility] for cardiac catheterization later today   Stage IV Hepatocellular carcinoma  - Pt is followed by Dr. Narda Rutherford  - he is being managed with immunotherapy (bevacizumab and atezolizumab) every 21 days   Hyponatremia - SIADH of malignancy - monitor - he is totally asymptomatic from this - fluid restriction advised - sodium slightly improved this morning    Chronic myalgias - side effect of immunotherapy treatments  - he has been on gabapentin for this  - also has been taking ibuprofen 800 mg Q6 hours which is on hold for now - oxycodone for pain as needed    Hypertension - he has been started on beta blocker by cardiology and amlodipine on hold    Hyperlipidemia  - High dose statin being restarted by cardiology in setting of NSTEMI    DVT prophylaxis: IV heparin infusion  Code Status: Full  Family Communication: daughter at bedside  Disposition: transfer to [facility] for [place]  Status is: Inpatient  Remains inpatient appropriate because: need  for cardiac catheterization, IV heparin infusion   Consultants:  Cardiology   Procedures:  [place] 11/1 at [facility]   Antimicrobials:  N/a    Subjective: Patient reports shoulder pain bilateral in the joints that is chronic for him but no chest pain.  He denies nausea vomiting and diarrhea.  No palpitations.  Objective: Vitals:   12/03/20 1017 12/03/20 1030 12/03/20 1100 12/03/20 1130  BP: 137/85 130/79 137/81  120/79  Pulse: 85   62  Resp:  (!) 9 13 11   Temp:      TempSrc:      SpO2: 100%   95%  Weight:      Height:       No intake or output data in the 24 hours ending 12/03/20 1254 Filed Weights   12/02/20 1107 12/02/20 1218  Weight: 99.8 kg 99.8 kg    Examination:  General exam: Appears calm and comfortable  Respiratory system: Clear to auscultation. Respiratory effort normal. Cardiovascular system: normal S1 & S2 heard. No JVD, murmurs, rubs, gallops or clicks. No pedal edema. Gastrointestinal system: Abdomen is nondistended, soft and nontender. No organomegaly or masses felt. Normal bowel sounds heard. Central nervous system: Alert and oriented. No focal neurological deficits. Extremities: Symmetric 5 x 5 power. Skin: No rashes, lesions or ulcers Psychiatry: Judgement and insight appear normal. Mood & affect appropriate.   Data Reviewed: I have personally reviewed following labs and imaging studies  CBC: Recent Labs  Lab 12/02/20 1126 12/03/20 0433  WBC 10.0 10.7*  HGB 15.8 14.1  HCT 44.3 41.6  MCV 86.0 87.0  PLT 306 269    Basic Metabolic Panel: Recent Labs  Lab 12/02/20 1127 12/03/20 0433  NA 128* 129*  K 4.1 4.3  CL 94* 95*  CO2 24 26  GLUCOSE 116* 117*  BUN 11 10  CREATININE 0.61 0.70  CALCIUM 9.4 9.3  MG  --  2.3    GFR: Estimated Creatinine Clearance: 104.1 mL/min (by C-G formula based on SCr of 0.7 mg/dL).  Liver Function Tests: Recent Labs  Lab 12/02/20 1127 12/03/20 0433  AST 37 28  ALT 20 17  ALKPHOS 104 103  BILITOT 1.2 1.5*  PROT 8.1 7.6  ALBUMIN 4.0 3.7    CBG: No results for input(s): GLUCAP in the last 168 hours.  Recent Results (from the past 240 hour(s))  Resp Panel by RT-PCR (Flu A&B, Covid) Nasopharyngeal Swab     Status: None   Collection Time: 12/02/20  5:16 PM   Specimen: Nasopharyngeal Swab; Nasopharyngeal(NP) swabs in vial transport medium  Result Value Ref Range Status   SARS Coronavirus 2 by RT PCR NEGATIVE  NEGATIVE Final    Comment: (NOTE) SARS-CoV-2 target nucleic acids are NOT DETECTED.  The SARS-CoV-2 RNA is generally detectable in upper respiratory specimens during the acute phase of infection. The lowest concentration of SARS-CoV-2 viral copies this assay can detect is 138 copies/mL. A negative result does not preclude SARS-Cov-2 infection and should not be used as the sole basis for treatment or other patient management decisions. A negative result may occur with  improper specimen collection/handling, submission of specimen other than nasopharyngeal swab, presence of viral mutation(s) within the areas targeted by this assay, and inadequate number of viral copies(<138 copies/mL). A negative result must be combined with clinical observations, patient history, and epidemiological information. The expected result is Negative.  Fact Sheet for Patients:  EntrepreneurPulse.com.au  Fact Sheet for Healthcare Providers:  IncredibleEmployment.be  This test is no t yet approved  or cleared by the [place] and  has been authorized for detection and/or diagnosis of SARS-CoV-2 by FDA under an Emergency Use Authorization (EUA). This EUA will remain  in effect (meaning this test can be used) for the duration of the COVID-19 declaration under Section 564(b)(1) of the Act, 21 U.S.C.section 360bbb-3(b)(1), unless the authorization is terminated  or revoked sooner.       Influenza A by PCR NEGATIVE NEGATIVE Final   Influenza B by PCR NEGATIVE NEGATIVE Final    Comment: (NOTE) The Xpert Xpress SARS-CoV-2/FLU/RSV plus assay is intended as an aid in the diagnosis of influenza from Nasopharyngeal swab specimens and should not be used as a sole basis for treatment. Nasal washings and aspirates are unacceptable for Xpert Xpress SARS-CoV-2/FLU/RSV testing.  Fact Sheet for Patients: EntrepreneurPulse.com.au  Fact Sheet for Healthcare  Providers: IncredibleEmployment.be  This test is not yet approved or cleared by the [place] FDA and has been authorized for detection and/or diagnosis of SARS-CoV-2 by FDA under an Emergency Use Authorization (EUA). This EUA will remain in effect (meaning this test can be used) for the duration of the COVID-19 declaration under Section 564(b)(1) of the Act, 21 U.S.C. section 360bbb-3(b)(1), unless the authorization is terminated or revoked.  Performed at [facility], [address], [place], [place] [address]      Radiology Studies: DG Chest 2 View  Result Date: 12/02/2020 CLINICAL DATA:  Chest pain EXAM: CHEST - 2 VIEW COMPARISON:  04/14/2011 FINDINGS: Heart size is normal. Mild tortuosity of the aorta. The lungs are clear. The pulmonary vascularity is normal. No pleural effusion. No pneumothorax. Right lateral inferior costophrenic angle is not included on the frontal view. Old healed rib fractures on the right at ribs 6, 7 and 8. Ordinary thoracic degenerative changes. Previous ACDF. IMPRESSION: No active disease. Old healed rib fractures on the right. Slightly tortuous aorta. Electronically Signed   By: Nelson Chimes M.D.   On: 12/02/2020 11:27    Scheduled Meds:  aspirin EC  81 mg Oral Daily   aspirin  325 mg Oral Once   atorvastatin  80 mg Oral Q0600   losartan  50 mg Oral Daily   metoprolol tartrate  12.5 mg Oral BID   pantoprazole  40 mg Oral Daily   sodium chloride flush  3 mL Intravenous Q12H   Continuous Infusions:  sodium chloride     sodium chloride 1 mL/kg/hr (12/03/20 0631)   heparin 1,450 Units/hr (12/03/20 0845)    LOS: 1 day   Time spent: 36 mins   Anzleigh Slaven Wynetta Emery, MD How to contact the [facility] Attending or Consulting provider [place] or covering provider during after hours [place], for this patient?  Check the care team in [facility] and look for a) attending/consulting TRH provider listed and b) the [facility] team listed Log into [email or web address] and  use [place]'s universal password to access. If you do not have the password, please contact the hospital operator. Locate the [facility] provider you are looking for under [facility] and page to a number that you can be directly reached. If you still have difficulty reaching the provider, please page the [facility] (Director on Call) for the Hospitalists listed on amion for assistance.  12/03/2020, 12:54 PM

## 2020-12-03 NOTE — H&P (View-Only) (Signed)
Progress Note  Patient Name: Logan Alvarez Date of Encounter: 12/03/2020  [facility] Cardiologist: Jenkins Rouge, MD    Subjective   Uncomfortable in bed. Chronic pain from immunotherapy. No chest pain  Inpatient Medications    Scheduled Meds:  aspirin EC  81 mg Oral Daily   aspirin  325 mg Oral Once   atorvastatin  80 mg Oral Q0600   losartan  50 mg Oral Daily   metoprolol tartrate  12.5 mg Oral BID   pantoprazole  40 mg Oral Daily   sodium chloride flush  3 mL Intravenous Q12H   Continuous Infusions:  sodium chloride     sodium chloride 1 mL/kg/hr (12/03/20 0631)   heparin 1,450 Units/hr (12/03/20 0845)   PRN Meds: sodium chloride, ALPRAZolam, fentaNYL (SUBLIMAZE) injection, fluticasone, ondansetron **OR** ondansetron (ZOFRAN) IV, oxyCODONE, sodium chloride, sodium chloride flush   Vital Signs    Vitals:   12/03/20 0200 12/03/20 0400 12/03/20 0600 12/03/20 0700  BP: (!) 142/87 124/80 138/88 125/79  Pulse: 75 76  76  Resp: 13 12 12 11   Temp:      TempSrc:      SpO2: 95% 97%  98%  Weight:      Height:       No intake or output data in the 24 hours ending 12/03/20 0921 Last 3 Weights 12/02/2020 12/02/2020 11/25/2020  Weight (lbs) 220 lb 220 lb 0.3 oz 218 lb  Weight (kg) 99.791 kg 99.8 kg 98.884 kg      Telemetry    NSR - Personally Reviewed  ECG    Tracing from 12/02/2020 shows normal sinus rhythm.  Physical Exam    GEN: No acute distress.   Neck: No JVD Cardiac: RRR, no murmurs, rubs, or gallops.  Respiratory: Clear to auscultation bilaterally. GI: Soft, nontender, non-distended  MS: No edema; No deformity. Neuro:  Nonfocal  Psych: Normal affect   Labs    High Sensitivity Troponin:   Recent Labs  Lab 12/02/20 1126 12/02/20 1313  TROPONINIHS 3,068* 2,916*     Chemistry Recent Labs  Lab 12/02/20 1127 12/03/20 0433  NA 128* 129*  K 4.1 4.3  CL 94* 95*  CO2 24 26  GLUCOSE 116* 117*  BUN 11 10  CREATININE 0.61 0.70  CALCIUM  9.4 9.3  MG  --  2.3  PROT 8.1 7.6  ALBUMIN 4.0 3.7  AST 37 28  ALT 20 17  ALKPHOS 104 103  BILITOT 1.2 1.5*  GFRNONAA >60 >60  ANIONGAP 10 8    Lipids  Recent Labs  Lab 12/03/20 0433  CHOL 158  TRIG 116  HDL 36*  LDLCALC 99  CHOLHDL 4.4    Hematology Recent Labs  Lab 12/02/20 1126 12/03/20 0433  WBC 10.0 10.7*  RBC 5.15 4.78  HGB 15.8 14.1  HCT 44.3 41.6  MCV 86.0 87.0  MCH 30.7 29.5  MCHC 35.7 33.9  RDW 14.1 14.3  PLT 306 375   Thyroid  Recent Labs  Lab 12/02/20 1126  TSH 5.299*     Radiology    DG Chest 2 View  Result Date: 12/02/2020 CLINICAL DATA:  Chest pain EXAM: CHEST - 2 VIEW COMPARISON:  04/14/2011 FINDINGS: Heart size is normal. Mild tortuosity of the aorta. The lungs are clear. The pulmonary vascularity is normal. No pleural effusion. No pneumothorax. Right lateral inferior costophrenic angle is not included on the frontal view. Old healed rib fractures on the right at ribs 6, 7 and 8. Ordinary thoracic  degenerative changes. Previous ACDF. IMPRESSION: No active disease. Old healed rib fractures on the right. Slightly tortuous aorta. Electronically Signed   By: Nelson Chimes M.D.   On: 12/02/2020 11:27    Cardiac Studies   Echocardiogram pending.  Patient Profile     65 y.o. male  with a hx of nonobstructive CAD on cath in 2014 who is being seen 12/02/2020 for the evaluation of chest pain and elevated troponins at the request of Dr. Laverta Baltimore.   Assessment & Plan    Recurrent episodes of chest pain, now pain free but Troponins 3000 range, EKG without acute change, similar episode 2 weeks ago. Cath 2014 with 90% D1, 40%Cfx, 30 & 40% mid and distal LAD. Dr. Domenic Polite spoke with oncology who say no contraindication for him to be on IV heparin and dual antiplatelet meds. Transfer for cath today at noon. I have reviewed the risks, indications, and alternatives to angioplasty and stenting with the patient. Risks include but are not limited to bleeding,  infection, vascular injury, stroke, myocardial infection, arrhythmia, kidney injury, radiation-related injury in the case of prolonged fluoroscopy use, emergency cardiac surgery, and death. The patient understands the risks of serious complication is low (<4%) and patient agrees to proceed.    HTN controlled on losartan and amlodipine at home. Will hold off on amlodipine as we ordered a beta blocker   HLD-stopped pravachol in June.  Need to resume statin therapy.   Hepatocellular CA stage IV on atezolizumab/bevaizumab.  For questions or updates, please contact [place] Please consult [email or web address] for contact info under     Signed, Ermalinda Barrios, PA-C  12/03/2020, 9:21 AM     Attending note:  Case discussed with Ms. Vita Barley, I agree with her above findings.  Patient awaits transfer for diagnostic cardiac catheterization at [facility] around midday.  Still chest pain free.  Systolic blood pressure 742-595, heart rate in the 70s to 80s in sinus rhythm by telemetry.  Pertinent lab work includes sodium 129, potassium 4.3, BUN 10, creatinine 0.7, high-sensitivity troponin I 2916, LDL 99, WBC 10.7, hemoglobin 14.1, platelets 375.  N.p.o. for now.  Anticipate cardiac catheterization today at [facility].  Continue aspirin, losartan, Lopressor, Lipitor, and IV heparin.  If obstructive CAD present, hopefully PCI will be a reasonable option considering current comorbid illness.  If surgical disease present, would need to review overall prognosis with oncology prior to making any decisions.  Satira Sark, M.D., F.A.C.C.

## 2020-12-04 ENCOUNTER — Telehealth: Payer: Self-pay | Admitting: *Deleted

## 2020-12-04 ENCOUNTER — Encounter (HOSPITAL_COMMUNITY): Payer: Self-pay | Admitting: Interventional Cardiology

## 2020-12-04 DIAGNOSIS — I200 Unstable angina: Secondary | ICD-10-CM

## 2020-12-04 DIAGNOSIS — F419 Anxiety disorder, unspecified: Secondary | ICD-10-CM

## 2020-12-04 LAB — CBC
HCT: 38.1 % — ABNORMAL LOW (ref 39.0–52.0)
Hemoglobin: 13.3 g/dL (ref 13.0–17.0)
MCH: 30 pg (ref 26.0–34.0)
MCHC: 34.9 g/dL (ref 30.0–36.0)
MCV: 86 fL (ref 80.0–100.0)
Platelets: 296 10*3/uL (ref 150–400)
RBC: 4.43 MIL/uL (ref 4.22–5.81)
RDW: 14.2 % (ref 11.5–15.5)
WBC: 10.4 10*3/uL (ref 4.0–10.5)
nRBC: 0 % (ref 0.0–0.2)

## 2020-12-04 LAB — COMPREHENSIVE METABOLIC PANEL
ALT: 17 U/L (ref 0–44)
AST: 22 U/L (ref 15–41)
Albumin: 3.1 g/dL — ABNORMAL LOW (ref 3.5–5.0)
Alkaline Phosphatase: 99 U/L (ref 38–126)
Anion gap: 8 (ref 5–15)
BUN: 8 mg/dL (ref 8–23)
CO2: 22 mmol/L (ref 22–32)
Calcium: 9.1 mg/dL (ref 8.9–10.3)
Chloride: 97 mmol/L — ABNORMAL LOW (ref 98–111)
Creatinine, Ser: 0.71 mg/dL (ref 0.61–1.24)
GFR, Estimated: >60 mL/min (ref >60)
Glucose, Bld: 110 mg/dL — ABNORMAL HIGH (ref 70–99)
Potassium: 4.1 mmol/L (ref 3.5–5.1)
Sodium: 127 mmol/L — ABNORMAL LOW (ref 135–145)
Total Bilirubin: 2 mg/dL — ABNORMAL HIGH (ref 0.3–1.2)
Total Protein: 6.6 g/dL (ref 6.5–8.1)

## 2020-12-04 MED ORDER — ATORVASTATIN CALCIUM 80 MG PO TABS: 80.0000 mg | ORAL_TABLET | Freq: Every day | ORAL | 0 refills | Status: DC

## 2020-12-04 MED ORDER — METOPROLOL TARTRATE 25 MG PO TABS: 12.5000 mg | ORAL_TABLET | Freq: Two times a day (BID) | ORAL | 1 refills | Status: DC

## 2020-12-04 MED ORDER — ASPIRIN EC 81 MG PO TBEC: 81.0000 mg | DELAYED_RELEASE_TABLET | Freq: Every day | ORAL | 11 refills | Status: DC

## 2020-12-04 MED ORDER — OXYCODONE HCL 5 MG PO TABS
5.0000 mg | ORAL_TABLET | Freq: Four times a day (QID) | ORAL | 0 refills | Status: AC | PRN
Start: 2020-12-04 — End: 2020-12-11

## 2020-12-04 MED ORDER — TICAGRELOR 90 MG PO TABS: 90.0000 mg | ORAL_TABLET | Freq: Two times a day (BID) | ORAL | 1 refills | Status: DC

## 2020-12-04 MED ORDER — EZETIMIBE 10 MG PO TABS: 10.0000 mg | ORAL_TABLET | Freq: Every day | ORAL | 1 refills | Status: DC

## 2020-12-04 NOTE — Assessment & Plan Note (Signed)
Chronic. Due to SIADH

## 2020-12-04 NOTE — Assessment & Plan Note (Addendum)
Pt ruled in for NSTEMI. Cardiology consulted. Pt underwent LHC on 12-03-2020. It showed:  Marland Kitchen  Mid LAD lesion is 25% stenosed. .  Mid Cx lesion is 80% stenosed. .  A drug-eluting stent was successfully placed using a STENT ONYX FRONTIER 3.5X22, postdilated to 4.0 mm. .  Post intervention, there is a 0% residual stenosis. .  Dist Cx lesion is 90% stenosed.  This distal vessel was small.  Plan for medical treatment. .  The left ventricular systolic function is normal. .  LV end diastolic pressure is mildly elevated. .  The left ventricular ejection fraction is 55-65% by visual estimate. .  There is no aortic valve stenosis.  NSTEMI likely due to circumflex disease.  Distal vessel with severe lesion but the vessel is small in this distribution.  Large mid circumflex was successfully treated.  Continue aggressive secondary prevention.  Plan for aspirin and Brilinta for 3-6 months of dual antiplatelet therapy total.  Ideally, he would complete a longer duration.  If there were bleeding issues, could adjust before a year.   Discussed with cardiology Dr. Harriet Masson. She wanted to stay with ASA and Brilinta. She did not want start plavix in replacement of Brilinta(after 1 month of Brilinta).

## 2020-12-04 NOTE — Discharge Summary (Signed)
Physician Discharge Summary  JACIER GLADU SPQ:[id number] DOB: 03-25-1955 DOA: 12/02/2020  PCP: Kathyrn Drown, MD  Admit date: 12/02/2020 Discharge date: 12/04/2020  Admitted From: Home Disposition:  Home  Recommendations for Outpatient Follow-up:  Follow up with PCP in 1-2 weeks Follow up with outpatient cardiology in 2-4 weeks  Home Health:No  Equipment/Devices:None  Discharge Condition:stable  CODE STATUS:FULL Diet recommendation: Heart Healthy    Brief/Interim Summary:  Logan Alvarez is a 65 y.o. male with medical history significant for stage IV hepatocellular carcinoma, coronary artery disease, hyperlipidemia, hypertension, testosterone deficiency who reportedly had an episode of left-sided chest wall tightness and discomfort last night associated with an aching pain similar to an episode he had 4 days ago.  He called EMS they came out to evaluate him and reportedly he had a normal EKG and was told to follow-up with his primary care doctor in the morning.  The patient declined going to the emergency department last night.  The patient says the pain does not radiate.  It was a gradual onset and resolved spontaneously.  Not associated with nausea or vomiting.  This was a new onset of symptoms and relieved by rest and not worsened.  He has cardiac risk factors including hypertension, hyperlipidemia he had to stop his statin in June due to hepatocellular carcinoma undergoing immunotherapy.  The treatments are causing him to have a lot of joint aches and pains and he takes ibuprofen 800 mg tablets regularly to help control the symptoms.   Since last night he has not had a recurrence of symptoms.  His daughter brought him to the emergency department today.  He was noted to have elevated high-sensitivity troponin tests around 3000.  He also complained of fatigue and very low energy.  He was seen by the cardiology team in the emergency department and they have recommended that he be admitted  for IV heparin with plan to transfer to [facility] for cardiac catheterization in the near future.   Hospital course:  NSTEMI (non-ST elevated myocardial infarction) ([place]) Pt ruled in for NSTEMI. Cardiology consulted. Pt underwent LHC on 12-03-2020. It showed:    Mid LAD lesion is 25% stenosed.   Mid Cx lesion is 80% stenosed.   A drug-eluting stent was successfully placed using a STENT ONYX FRONTIER 3.5X22, postdilated to 4.0 mm.   Post intervention, there is a 0% residual stenosis.   Dist Cx lesion is 90% stenosed.  This distal vessel was small.  Plan for medical treatment.   The left ventricular systolic function is normal.   LV end diastolic pressure is mildly elevated.   The left ventricular ejection fraction is 55-65% by visual estimate.   There is no aortic valve stenosis.   NSTEMI likely due to circumflex disease.  Distal vessel with severe lesion but the vessel is small in this distribution.  Large mid circumflex was successfully treated.  Continue aggressive secondary prevention.  Plan for aspirin and Brilinta for 3-6 months of dual antiplatelet therapy total.  Ideally, he would complete a longer duration.  If there were bleeding issues, could adjust before a year.   Discussed with cardiology Dr. Harriet Masson. She wanted to stay with ASA and Brilinta. She did not want start plavix in replacement of Brilinta(after 1 month of Brilinta).  Chest pain Pt ruled in for NSTEMI. Had stent placed on 12-03-2020  HTN (hypertension) Due to his NSTEMI, lopressor added to his regimen. norvasc stopped. Pt continued on losartan.  Mixed hyperlipidemia Pt started  on high dose statin lipid 80 mg daily. Added zetia 10 mg daily  Testosterone deficiency Chronic.  Hepatocellular carcinoma ([place]) Pt continues to c/o of generalized pain. Due to ASA/brilinta therapy, pt cannot take ibuprofen in the large doses he was taking before. Pt states he was taking 3200 mg of ibuprofen at day. 7 day rx for oxycodone  5 mg (q6h prn) was given to patient. Pt will need to f/u with PCP or heme/onc for further treatment of his chronic pain.  Hyponatremia Chronic. Due to SIADH  NSAID long-term use Pt advised to stop taking ibuprofen. Pt given small rx for oxycodone. Pt to f/u with PCP or heme/onc with his ongoing chronic pain issues.  Anxiety On prn xanax by his PCP.    Discharge Diagnoses:  Principal Problem:   NSTEMI (non-ST elevated myocardial infarction) ([place]) Active Problems:   Chest pain   HTN (hypertension)   Mixed hyperlipidemia   Testosterone deficiency   Hepatocellular carcinoma (H[facility])   Erectile dysfunction   CAD (coronary artery disease)   SIADH (syndrome of inappropriate ADH production) ([facility])   Hyponatremia   Elevated troponin   Elevated CEA   Elevated CA 19-9 level   NSAID long-term use   Anxiety    Discharge Instructions  Discharge Instructions     AMB Referral to Cardiac Rehabilitation - Phase II   Complete by: As directed    Diagnosis:  Coronary Stents NSTEMI     After initial evaluation and assessments completed: Virtual Based Care may be provided alone or in conjunction with Phase 2 Cardiac Rehab based on patient barriers.: Yes   Call MD for:  difficulty breathing, headache or visual disturbances   Complete by: As directed    Call MD for:  persistant dizziness or light-headedness   Complete by: As directed    Call MD for:  persistant nausea and vomiting   Complete by: As directed    Call MD for:  severe uncontrolled pain   Complete by: As directed    Diet - low sodium heart healthy   Complete by: As directed    Increase activity slowly   Complete by: As directed    Increase activity slowly   Complete by: As directed       Allergies as of 12/04/2020       Reactions   Cymbalta [duloxetine Hcl]    Bad dreams   Lodine [etodolac] Nausea And Vomiting   Zoloft [sertraline Hcl]    REDUCED URINARY FLOW,NOCTURIA        Medication List     STOP taking  these medications    amLODipine 10 MG tablet Commonly known as: NORVASC   gabapentin 300 MG capsule Commonly known as: NEURONTIN   ibuprofen 200 MG tablet Commonly known as: ADVIL       TAKE these medications    ALPRAZolam 0.5 MG tablet Commonly known as: XANAX 1/2 tablet to 1 tablet up to 3 times a day as needed for anxiety   aspirin EC 81 MG tablet Take 1 tablet (81 mg total) by mouth daily. Swallow whole.   atorvastatin 80 MG tablet Commonly known as: LIPITOR Take 1 tablet (80 mg total) by mouth daily at 6 (six) AM. Start taking on: December 05, 2020   dextromethorphan-guaiFENesin 10-100 MG/5ML liquid Commonly known as: ROBITUSSIN-DM Take by mouth.   ezetimibe 10 MG tablet Commonly known as: Zetia Take 1 tablet (10 mg total) by mouth daily.   fluticasone 50 MCG/ACT nasal spray Commonly known as:  FLONASE Place into the nose.   losartan 50 MG tablet Commonly known as: COZAAR Take 1 tablet (50 mg total) by mouth daily.   metoprolol tartrate 25 MG tablet Commonly known as: LOPRESSOR Take 0.5 tablets (12.5 mg total) by mouth 2 (two) times daily.   oxyCODONE 5 MG immediate release tablet Commonly known as: Oxy IR/ROXICODONE Take 1 tablet (5 mg total) by mouth every 6 (six) hours as needed for up to 7 days for moderate pain.   sodium chloride 0.65 % nasal spray Commonly known as: OCEAN 1 spray as needed for Congestion   ticagrelor 90 MG Tabs tablet Commonly known as: BRILINTA Take 1 tablet (90 mg total) by mouth 2 (two) times daily.        Allergies  Allergen Reactions   Cymbalta [Duloxetine Hcl]     Bad dreams   Lodine [Etodolac] Nausea And Vomiting   Zoloft [Sertraline Hcl]     REDUCED URINARY FLOW,NOCTURIA    Consultations: Cardiology  Procedures/Studies: DG Chest 2 View  Result Date: 12/02/2020 CLINICAL DATA:  Chest pain EXAM: CHEST - 2 VIEW COMPARISON:  04/14/2011 FINDINGS: Heart size is normal. Mild tortuosity of the aorta. The lungs  are clear. The pulmonary vascularity is normal. No pleural effusion. No pneumothorax. Right lateral inferior costophrenic angle is not included on the frontal view. Old healed rib fractures on the right at ribs 6, 7 and 8. Ordinary thoracic degenerative changes. Previous ACDF. IMPRESSION: No active disease. Old healed rib fractures on the right. Slightly tortuous aorta. Electronically Signed   By: Nelson Chimes M.D.   On: 12/02/2020 11:27   CARDIAC CATHETERIZATION  Result Date: 12/03/2020   Mid LAD lesion is 25% stenosed.   Mid Cx lesion is 80% stenosed.   A drug-eluting stent was successfully placed using a STENT ONYX FRONTIER 3.5X22, postdilated to 4.0 mm.   Post intervention, there is a 0% residual stenosis.   Dist Cx lesion is 90% stenosed.  This distal vessel was small.  Plan for medical treatment.   The left ventricular systolic function is normal.   LV end diastolic pressure is mildly elevated.   The left ventricular ejection fraction is 55-65% by visual estimate.   There is no aortic valve stenosis. NSTEMI likely due to circumflex disease.  Distal vessel with severe lesion but the vessel is small in this distribution.  Large mid circumflex was successfully treated.  Continue aggressive secondary prevention. Plan for aspirin and Brilinta for 1 month.  Given his other comorbidities, would change Brilinta to clopidogrel after a month.  Would like for him to at a minimum complete 3-6 months of dual antiplatelet therapy total.  Ideally, he would complete a longer duration.  If there were bleeding issues, could adjust before a year. Results conveyed to his daughter Vito Backers [phone]   ECHOCARDIOGRAM COMPLETE  Result Date: 12/03/2020    ECHOCARDIOGRAM REPORT   Patient Name:   ALYX GEE Date of Exam: 12/03/2020 Medical Rec #:  [id number]      Height:       68.0 in Accession #:    [id number]     Weight:       220.0 lb Date of Birth:  [birth year]       BSA:          2.128 m Patient Age:    65 years        BP:           125/79 mmHg Patient Gender: M  HR:           76 bpm. Exam Location:  [facility] Procedure: 2D Echo, Cardiac Doppler and Color Doppler Indications:    Acute Myocardial infarction  History:        Patient has prior history of Echocardiogram examinations, most                 recent 04/24/2011. CAD; Risk Factors:Hypertension.  Sonographer:    Wenda Low Referring Phys: [place]  1. Left ventricular ejection fraction, by estimation, is 55 to 60%. The left ventricle has normal function. The left ventricle has no regional wall motion abnormalities. Left ventricular diastolic parameters were normal.  2. Right ventricular systolic function is normal. The right ventricular size is normal. Tricuspid regurgitation signal is inadequate for assessing PA pressure.  3. The mitral valve is grossly normal. Trivial mitral valve regurgitation.  4. The aortic valve is grossly normal. Aortic valve regurgitation is not visualized. No aortic stenosis is present. Aortic valve mean gradient measures 4.0 mmHg.  5. The inferior vena cava is normal in size with greater than 50% respiratory variability, suggesting right atrial pressure of 3 mmHg. Comparison(s): No prior Echocardiogram. FINDINGS  Left Ventricle: Left ventricular ejection fraction, by estimation, is 55 to 60%. The left ventricle has normal function. The left ventricle has no regional wall motion abnormalities. The left ventricular internal cavity size was normal in size. There is  no left ventricular hypertrophy. Left ventricular diastolic parameters were normal. Right Ventricle: The right ventricular size is normal. No increase in right ventricular wall thickness. Right ventricular systolic function is normal. Tricuspid regurgitation signal is inadequate for assessing PA pressure. Left Atrium: Left atrial size was normal in size. Right Atrium: Right atrial size was normal in size. Pericardium: There is no evidence of  pericardial effusion. Mitral Valve: The mitral valve is grossly normal. Trivial mitral valve regurgitation. MV peak gradient, 1.9 mmHg. The mean mitral valve gradient is 1.0 mmHg. Tricuspid Valve: The tricuspid valve is grossly normal. Tricuspid valve regurgitation is trivial. Aortic Valve: The aortic valve is grossly normal. There is mild aortic valve annular calcification. Aortic valve regurgitation is not visualized. No aortic stenosis is present. Aortic valve mean gradient measures 4.0 mmHg. Aortic valve peak gradient measures 6.1 mmHg. Aortic valve area, by VTI measures 2.38 cm. Pulmonic Valve: The pulmonic valve was grossly normal. Pulmonic valve regurgitation is trivial. Aorta: The aortic root is normal in size and structure. Venous: The inferior vena cava is normal in size with greater than 50% respiratory variability, suggesting right atrial pressure of 3 mmHg. IAS/Shunts: No atrial level shunt detected by color flow Doppler.  LEFT VENTRICLE PLAX 2D LVIDd:         4.70 cm     Diastology LVIDs:         3.40 cm     LV e' medial:    6.65 cm/s LV PW:         1.00 cm     LV E/e' medial:  8.4 LV IVS:        0.90 cm     LV e' lateral:   7.87 cm/s LVOT diam:     2.00 cm     LV E/e' lateral: 7.1 LV SV:         68 LV SV Index:   32 LVOT Area:     3.14 cm  LV Volumes (MOD) LV vol d, MOD A2C: 51.1 ml LV vol d, MOD A4C:  82.1 ml LV vol s, MOD A2C: 24.3 ml LV vol s, MOD A4C: 36.9 ml LV SV MOD A2C:     26.8 ml LV SV MOD A4C:     82.1 ml LV SV MOD BP:      36.3 ml RIGHT VENTRICLE RV Basal diam:  3.25 cm RV S prime:     15.90 cm/s TAPSE (M-mode): 2.8 cm LEFT ATRIUM             Index        RIGHT ATRIUM           Index LA diam:        3.90 cm 1.83 cm/m   RA Area:     15.80 cm LA Vol (A2C):   52.7 ml 24.76 ml/m  RA Volume:   35.80 ml  16.82 ml/m LA Vol (A4C):   67.7 ml 31.81 ml/m LA Biplane Vol: 63.0 ml 29.60 ml/m  AORTIC VALVE                    PULMONIC VALVE AV Area (Vmax):    2.52 cm     PV Vmax:       0.75 m/s AV  Area (Vmean):   2.33 cm     PV Peak grad:  2.3 mmHg AV Area (VTI):     2.38 cm AV Vmax:           123.00 cm/s AV Vmean:          90.400 cm/s AV VTI:            0.288 m AV Peak Grad:      6.1 mmHg AV Mean Grad:      4.0 mmHg LVOT Vmax:         98.80 cm/s LVOT Vmean:        67.000 cm/s LVOT VTI:          0.218 m LVOT/AV VTI ratio: 0.76  AORTA Ao Root diam: 3.70 cm MITRAL VALVE MV Area (PHT): 4.04 cm    SHUNTS MV Area VTI:   4.72 cm    Systemic VTI:  0.22 m MV Peak grad:  1.9 mmHg    Systemic Diam: 2.00 cm MV Mean grad:  1.0 mmHg MV Vmax:       0.69 m/s MV Vmean:      34.8 cm/s MV Decel Time: 188 msec MV E velocity: 56.10 cm/s MV A velocity: 61.50 cm/s MV E/A ratio:  0.91 Rozann Lesches MD Electronically signed by Rozann Lesches MD Signature Date/Time: 12/03/2020/3:07:25 PM    Final    (Echo, Carotid, EGD, Colonoscopy, ERCP)    Subjective:   Discharge Exam: Vitals:   12/04/20 0452 12/04/20 0925  BP: (!) 151/91 136/87  Pulse:  90  Resp: 16   Temp: 98.3 F (36.8 C)   SpO2: 99%    Vitals:   12/03/20 2043 12/04/20 0041 12/04/20 0452 12/04/20 0925  BP: (!) 160/87 123/72 (!) 151/91 136/87  Pulse: 78 78  90  Resp: 18  16   Temp:   98.3 F (36.8 C)   TempSrc:   Oral   SpO2: 99% 96% 99%   Weight:      Height:        General: Pt is alert, awake, not in acute distress Cardiovascular: RRR, S1/S2 +, no rubs, no gallops Respiratory: CTA bilaterally, no wheezing, no rhonchi Abdominal: Soft, NT, ND, bowel sounds + Extremities: no edema, no cyanosis    The  results of significant diagnostics from this hospitalization (including imaging, microbiology, ancillary and laboratory) are listed below for reference.     Microbiology: Recent Results (from the past 240 hour(s))  Resp Panel by RT-PCR (Flu A&B, Covid) Nasopharyngeal Swab     Status: None   Collection Time: 12/02/20  5:16 PM   Specimen: Nasopharyngeal Swab; Nasopharyngeal(NP) swabs in vial transport medium  Result Value Ref Range  Status   SARS Coronavirus 2 by RT PCR NEGATIVE NEGATIVE Final    Comment: (NOTE) SARS-CoV-2 target nucleic acids are NOT DETECTED.  The SARS-CoV-2 RNA is generally detectable in upper respiratory specimens during the acute phase of infection. The lowest concentration of SARS-CoV-2 viral copies this assay can detect is 138 copies/mL. A negative result does not preclude SARS-Cov-2 infection and should not be used as the sole basis for treatment or other patient management decisions. A negative result may occur with  improper specimen collection/handling, submission of specimen other than nasopharyngeal swab, presence of viral mutation(s) within the areas targeted by this assay, and inadequate number of viral copies(<138 copies/mL). A negative result must be combined with clinical observations, patient history, and epidemiological information. The expected result is Negative.  Fact Sheet for Patients:  EntrepreneurPulse.com.au  Fact Sheet for Healthcare Providers:  IncredibleEmployment.be  This test is no t yet approved or cleared by the [place] FDA and  has been authorized for detection and/or diagnosis of SARS-CoV-2 by FDA under an Emergency Use Authorization (EUA). This EUA will remain  in effect (meaning this test can be used) for the duration of the COVID-19 declaration under Section 564(b)(1) of the Act, 21 U.S.C.section 360bbb-3(b)(1), unless the authorization is terminated  or revoked sooner.       Influenza A by PCR NEGATIVE NEGATIVE Final   Influenza B by PCR NEGATIVE NEGATIVE Final    Comment: (NOTE) The Xpert Xpress SARS-CoV-2/FLU/RSV plus assay is intended as an aid in the diagnosis of influenza from Nasopharyngeal swab specimens and should not be used as a sole basis for treatment. Nasal washings and aspirates are unacceptable for Xpert Xpress SARS-CoV-2/FLU/RSV testing.  Fact Sheet for  Patients: EntrepreneurPulse.com.au  Fact Sheet for Healthcare Providers: IncredibleEmployment.be  This test is not yet approved or cleared by the [place] FDA and has been authorized for detection and/or diagnosis of SARS-CoV-2 by FDA under an Emergency Use Authorization (EUA). This EUA will remain in effect (meaning this test can be used) for the duration of the COVID-19 declaration under Section 564(b)(1) of the Act, 21 U.S.C. section 360bbb-3(b)(1), unless the authorization is terminated or revoked.  Performed at [facility], [address], [place],  [address]      Labs: BNP (last 3 results) No results for input(s): BNP in the last 8760 hours. Basic Metabolic Panel: Recent Labs  Lab 12/02/20 1127 12/03/20 0433 12/04/20 0239  NA 128* 129* 127*  K 4.1 4.3 4.1  CL 94* 95* 97*  CO2 24 26 22   GLUCOSE 116* 117* 110*  BUN 11 10 8   CREATININE 0.61 0.70 0.71  CALCIUM 9.4 9.3 9.1  MG  --  2.3  --    Liver Function Tests: Recent Labs  Lab 12/02/20 1127 12/03/20 0433 12/04/20 0239  AST 37 28 22  ALT 20 17 17   ALKPHOS 104 103 99  BILITOT 1.2 1.5* 2.0*  PROT 8.1 7.6 6.6  ALBUMIN 4.0 3.7 3.1*   No results for input(s): LIPASE, AMYLASE in the last 168 hours. No results for input(s): AMMONIA in  the last 168 hours. CBC: Recent Labs  Lab 12/02/20 1126 12/03/20 0433 12/04/20 0239  WBC 10.0 10.7* 10.4  HGB 15.8 14.1 13.3  HCT 44.3 41.6 38.1*  MCV 86.0 87.0 86.0  PLT 306 375 296   Cardiac Enzymes: No results for input(s): CKTOTAL, CKMB, CKMBINDEX, TROPONINI in the last 168 hours. BNP: Invalid input(s): POCBNP CBG: No results for input(s): GLUCAP in the last 168 hours. D-Dimer No results for input(s): DDIMER in the last 72 hours. Hgb A1c Recent Labs    12/02/20 1126  HGBA1C 5.1   Lipid Profile Recent Labs    12/03/20 0433  CHOL 158  HDL 36*  LDLCALC 99  TRIG 116  CHOLHDL 4.4   Thyroid function  studies Recent Labs    12/02/20 1126  TSH 5.299*   Anemia work up No results for input(s): VITAMINB12, FOLATE, FERRITIN, TIBC, IRON, RETICCTPCT in the last 72 hours. Urinalysis    Component Value Date/Time   PROTEINUR NEGATIVE 09/04/2020 1200   Sepsis Labs Invalid input(s): PROCALCITONIN,  WBC,  LACTICIDVEN Microbiology Recent Results (from the past 240 hour(s))  Resp Panel by RT-PCR (Flu A&B, Covid) Nasopharyngeal Swab     Status: None   Collection Time: 12/02/20  5:16 PM   Specimen: Nasopharyngeal Swab; Nasopharyngeal(NP) swabs in vial transport medium  Result Value Ref Range Status   SARS Coronavirus 2 by RT PCR NEGATIVE NEGATIVE Final    Comment: (NOTE) SARS-CoV-2 target nucleic acids are NOT DETECTED.  The SARS-CoV-2 RNA is generally detectable in upper respiratory specimens during the acute phase of infection. The lowest concentration of SARS-CoV-2 viral copies this assay can detect is 138 copies/mL. A negative result does not preclude SARS-Cov-2 infection and should not be used as the sole basis for treatment or other patient management decisions. A negative result may occur with  improper specimen collection/handling, submission of specimen other than nasopharyngeal swab, presence of viral mutation(s) within the areas targeted by this assay, and inadequate number of viral copies(<138 copies/mL). A negative result must be combined with clinical observations, patient history, and epidemiological information. The expected result is Negative.  Fact Sheet for Patients:  EntrepreneurPulse.com.au  Fact Sheet for Healthcare Providers:  IncredibleEmployment.be  This test is no t yet approved or cleared by the [place] FDA and  has been authorized for detection and/or diagnosis of SARS-CoV-2 by FDA under an Emergency Use Authorization (EUA). This EUA will remain  in effect (meaning this test can be used) for the duration of  the COVID-19 declaration under Section 564(b)(1) of the Act, 21 U.S.C.section 360bbb-3(b)(1), unless the authorization is terminated  or revoked sooner.       Influenza A by PCR NEGATIVE NEGATIVE Final   Influenza B by PCR NEGATIVE NEGATIVE Final    Comment: (NOTE) The Xpert Xpress SARS-CoV-2/FLU/RSV plus assay is intended as an aid in the diagnosis of influenza from Nasopharyngeal swab specimens and should not be used as a sole basis for treatment. Nasal washings and aspirates are unacceptable for Xpert Xpress SARS-CoV-2/FLU/RSV testing.  Fact Sheet for Patients: EntrepreneurPulse.com.au  Fact Sheet for Healthcare Providers: IncredibleEmployment.be  This test is not yet approved or cleared by the [place] FDA and has been authorized for detection and/or diagnosis of SARS-CoV-2 by FDA under an Emergency Use Authorization (EUA). This EUA will remain in effect (meaning this test can be used) for the duration of the COVID-19 declaration under Section 564(b)(1) of the Act, 21 U.S.C. section 360bbb-3(b)(1), unless the authorization is terminated or revoked.  Performed at [facility], [address], [place], [place] [address]      Time coordinating discharge: 40 minutes  SIGNED:   Kristopher Oppenheim, DO [facility] 12/04/2020, 11:45 AM

## 2020-12-04 NOTE — Assessment & Plan Note (Signed)
Pt advised to stop taking ibuprofen. Pt given small rx for oxycodone. Pt to f/u with PCP or heme/onc with his ongoing chronic pain issues.

## 2020-12-04 NOTE — Progress Notes (Signed)
CARDIAC REHAB PHASE I   PRE:  Rate/Rhythm: 83 SR  BP:  Supine:   Sitting: 141/84  Standing:    SaO2: 96 RA  MODE:  Ambulation: 470 ft   POST:  Rate/Rhythm: 95 SR  BP:  Supine:    Sitting: 144/79  Standing:    SaO2:   1055-1200 Pt tolerated ambulation well without c/o of SOB or cp. Gait steady. VS stable. Pt to recliner after walk with call light in reach. Completed MI and stent education with pt and daughter. We discussed MI, stent, risk factors, modifications, heart healthy diet, Brilinta for a month then Plavix, exercise guidelines, proper use of sl NTG, calling MD, 911  and Outpt. CRP. Pt and daughter voices understanding. Will send referral to Outpt. CRP in [place]. Rodney Langton RN 12/04/2020 11:53 AM

## 2020-12-04 NOTE — Telephone Encounter (Signed)
Transition Care Management Follow-up Telephone Call Date of discharge and from where: 12/04/20 from [facility] How have you been since you were released from the hospital? On way Any questions or concerns? Yes- would like to discuss labs at follow up  Items Reviewed: Did the pt receive and understand the discharge instructions provided? Yes  Medications obtained and verified? Yes  Other? N/A Any new allergies since your discharge? No  Dietary orders reviewed? N/A Do you have support at home? Yes - children helping  Home Care and Equipment/Supplies: Were home health services ordered? not applicable If so, what is the name of the agency? N/A  Has the agency set up a time to come to the patient's home? not applicable Were any new equipment or medical supplies ordered?  N/A What is the name of the medical supply agency? N/A Were you able to get the supplies/equipment? not applicable Do you have any questions related to the use of the equipment or supplies? N/A  Functional Questionnaire: (I = Independent and D = Dependent) ADLs: I  Bathing/Dressing- I  Meal Prep- I  Eating- I  Maintaining continence- I  Transferring/Ambulation- I  Managing Meds- I  Follow up appointments reviewed:  PCP Hospital f/u appt confirmed? Yes  Scheduled to see Dr Nicki Reaper on 12/09/20 @ 10:20am. [facility] f/u appt confirmed? Yes  Scheduled to see Cardiology on 12/13/20 @ 2:00pm. Are transportation arrangements needed? No  If their condition worsens, is the pt aware to call PCP or go to the Emergency Dept.? Yes Was the patient provided with contact information for the PCP's office or ED? Yes Was to pt encouraged to call back with questions or concerns? Yes

## 2020-12-04 NOTE — Assessment & Plan Note (Signed)
Due to his NSTEMI, lopressor added to his regimen. norvasc stopped. Pt continued on losartan.

## 2020-12-04 NOTE — Assessment & Plan Note (Addendum)
Pt started on high dose statin lipid 80 mg daily. Added zetia 10 mg daily

## 2020-12-04 NOTE — Assessment & Plan Note (Signed)
On prn xanax by his PCP.

## 2020-12-04 NOTE — Progress Notes (Signed)
Progress Note  Patient Name: Logan Alvarez Date of Encounter: 12/04/2020  Primary Cardiologist: Jenkins Rouge, MD   Subjective   Patient seen examined his bedside.  He is status post PCI yesterday with DES to the circumflex  Inpatient Medications    Scheduled Meds:  aspirin EC  81 mg Oral Daily   atorvastatin  80 mg Oral Q0600   feeding supplement  237 mL Oral BID BM   losartan  50 mg Oral Daily   metoprolol tartrate  12.5 mg Oral BID   pantoprazole  40 mg Oral Daily   sodium chloride flush  3 mL Intravenous Q12H   ticagrelor  90 mg Oral BID   Continuous Infusions:  sodium chloride 250 mL (12/03/20 2208)   PRN Meds: sodium chloride, acetaminophen, ALPRAZolam, fentaNYL (SUBLIMAZE) injection, fluticasone, ondansetron **OR** ondansetron (ZOFRAN) IV, ondansetron (ZOFRAN) IV, oxyCODONE, sodium chloride, sodium chloride flush   Vital Signs    Vitals:   12/03/20 2043 12/04/20 0041 12/04/20 0452 12/04/20 0925  BP: (!) 160/87 123/72 (!) 151/91 136/87  Pulse: 78 78  90  Resp: 18  16   Temp:   98.3 F (36.8 C)   TempSrc:   Oral   SpO2: 99% 96% 99%   Weight:      Height:        Intake/Output Summary (Last 24 hours) at 12/04/2020 1048 Last data filed at 12/04/2020 0450 Gross per 24 hour  Intake 2437.77 ml  Output 0 ml  Net 2437.77 ml   Filed Weights   12/02/20 1107 12/02/20 1218  Weight: 99.8 kg 99.8 kg    Telemetry    Sinus rhythm, heart rate 77- Personally Reviewed  ECG    Sinus rhythm, heart rate 88 bpm compared to prior EKG no significant change- Personally Reviewed  Physical Exam   GEN: No acute distress.   Neck: No JVD Cardiac: RRR, no murmurs, rubs, or gallops.  Respiratory: Clear to auscultation bilaterally. GI: Soft, nontender, non-distended  MS: No edema; No deformity. Neuro:  Nonfocal  Psych: Normal affect   Labs    Chemistry Recent Labs  Lab 12/02/20 1127 12/03/20 0433 12/04/20 0239  NA 128* 129* 127*  K 4.1 4.3 4.1  CL 94* 95* 97*   CO2 24 26 22   GLUCOSE 116* 117* 110*  BUN 11 10 8   CREATININE 0.61 0.70 0.71  CALCIUM 9.4 9.3 9.1  PROT 8.1 7.6 6.6  ALBUMIN 4.0 3.7 3.1*  AST 37 28 22  ALT 20 17 17   ALKPHOS 104 103 99  BILITOT 1.2 1.5* 2.0*  GFRNONAA >60 >60 >60  ANIONGAP 10 8 8      Hematology Recent Labs  Lab 12/02/20 1126 12/03/20 0433 12/04/20 0239  WBC 10.0 10.7* 10.4  RBC 5.15 4.78 4.43  HGB 15.8 14.1 13.3  HCT 44.3 41.6 38.1*  MCV 86.0 87.0 86.0  MCH 30.7 29.5 30.0  MCHC 35.7 33.9 34.9  RDW 14.1 14.3 14.2  PLT 306 375 296    Cardiac EnzymesNo results for input(s): TROPONINI in the last 168 hours. No results for input(s): TROPIPOC in the last 168 hours.   BNPNo results for input(s): BNP, PROBNP in the last 168 hours.   DDimer No results for input(s): DDIMER in the last 168 hours.   Radiology    DG Chest 2 View  Result Date: 12/02/2020 CLINICAL DATA:  Chest pain EXAM: CHEST - 2 VIEW COMPARISON:  04/14/2011 FINDINGS: Heart size is normal. Mild tortuosity of the aorta. The lungs  are clear. The pulmonary vascularity is normal. No pleural effusion. No pneumothorax. Right lateral inferior costophrenic angle is not included on the frontal view. Old healed rib fractures on the right at ribs 6, 7 and 8. Ordinary thoracic degenerative changes. Previous ACDF. IMPRESSION: No active disease. Old healed rib fractures on the right. Slightly tortuous aorta. Electronically Signed   By: Nelson Chimes M.D.   On: 12/02/2020 11:27   CARDIAC CATHETERIZATION  Result Date: 12/03/2020   Mid LAD lesion is 25% stenosed.   Mid Cx lesion is 80% stenosed.   A drug-eluting stent was successfully placed using a STENT ONYX FRONTIER 3.5X22, postdilated to 4.0 mm.   Post intervention, there is a 0% residual stenosis.   Dist Cx lesion is 90% stenosed.  This distal vessel was small.  Plan for medical treatment.   The left ventricular systolic function is normal.   LV end diastolic pressure is mildly elevated.   The left ventricular  ejection fraction is 55-65% by visual estimate.   There is no aortic valve stenosis. NSTEMI likely due to circumflex disease.  Distal vessel with severe lesion but the vessel is small in this distribution.  Large mid circumflex was successfully treated.  Continue aggressive secondary prevention. Plan for aspirin and Brilinta for 1 month.  Given his other comorbidities, would change Brilinta to clopidogrel after a month.  Would like for him to at a minimum complete 3-6 months of dual antiplatelet therapy total.  Ideally, he would complete a longer duration.  If there were bleeding issues, could adjust before a year. Results conveyed to his daughter Vito Backers [phone]   ECHOCARDIOGRAM COMPLETE  Result Date: 12/03/2020    ECHOCARDIOGRAM REPORT   Patient Name:   Logan Alvarez Date of Exam: 12/03/2020 Medical Rec #:  [id number]      Height:       68.0 in Accession #:    [id number]     Weight:       220.0 lb Date of Birth:  07/07/1955       BSA:          2.128 m Patient Age:    65 years       BP:           125/79 mmHg Patient Gender: M              HR:           76 bpm. Exam Location:  [facility] Procedure: 2D Echo, Cardiac Doppler and Color Doppler Indications:    Acute Myocardial infarction  History:        Patient has prior history of Echocardiogram examinations, most                 recent 04/24/2011. CAD; Risk Factors:Hypertension.  Sonographer:    Wenda Low Referring Phys: [place]  1. Left ventricular ejection fraction, by estimation, is 55 to 60%. The left ventricle has normal function. The left ventricle has no regional wall motion abnormalities. Left ventricular diastolic parameters were normal.  2. Right ventricular systolic function is normal. The right ventricular size is normal. Tricuspid regurgitation signal is inadequate for assessing PA pressure.  3. The mitral valve is grossly normal. Trivial mitral valve regurgitation.  4. The aortic valve is grossly normal. Aortic  valve regurgitation is not visualized. No aortic stenosis is present. Aortic valve mean gradient measures 4.0 mmHg.  5. The inferior vena cava is normal in size with greater than 50%  respiratory variability, suggesting right atrial pressure of 3 mmHg. Comparison(s): No prior Echocardiogram. FINDINGS  Left Ventricle: Left ventricular ejection fraction, by estimation, is 55 to 60%. The left ventricle has normal function. The left ventricle has no regional wall motion abnormalities. The left ventricular internal cavity size was normal in size. There is  no left ventricular hypertrophy. Left ventricular diastolic parameters were normal. Right Ventricle: The right ventricular size is normal. No increase in right ventricular wall thickness. Right ventricular systolic function is normal. Tricuspid regurgitation signal is inadequate for assessing PA pressure. Left Atrium: Left atrial size was normal in size. Right Atrium: Right atrial size was normal in size. Pericardium: There is no evidence of pericardial effusion. Mitral Valve: The mitral valve is grossly normal. Trivial mitral valve regurgitation. MV peak gradient, 1.9 mmHg. The mean mitral valve gradient is 1.0 mmHg. Tricuspid Valve: The tricuspid valve is grossly normal. Tricuspid valve regurgitation is trivial. Aortic Valve: The aortic valve is grossly normal. There is mild aortic valve annular calcification. Aortic valve regurgitation is not visualized. No aortic stenosis is present. Aortic valve mean gradient measures 4.0 mmHg. Aortic valve peak gradient measures 6.1 mmHg. Aortic valve area, by VTI measures 2.38 cm. Pulmonic Valve: The pulmonic valve was grossly normal. Pulmonic valve regurgitation is trivial. Aorta: The aortic root is normal in size and structure. Venous: The inferior vena cava is normal in size with greater than 50% respiratory variability, suggesting right atrial pressure of 3 mmHg. IAS/Shunts: No atrial level shunt detected by color flow  Doppler.  LEFT VENTRICLE PLAX 2D LVIDd:         4.70 cm     Diastology LVIDs:         3.40 cm     LV e' medial:    6.65 cm/s LV PW:         1.00 cm     LV E/e' medial:  8.4 LV IVS:        0.90 cm     LV e' lateral:   7.87 cm/s LVOT diam:     2.00 cm     LV E/e' lateral: 7.1 LV SV:         68 LV SV Index:   32 LVOT Area:     3.14 cm  LV Volumes (MOD) LV vol d, MOD A2C: 51.1 ml LV vol d, MOD A4C: 82.1 ml LV vol s, MOD A2C: 24.3 ml LV vol s, MOD A4C: 36.9 ml LV SV MOD A2C:     26.8 ml LV SV MOD A4C:     82.1 ml LV SV MOD BP:      36.3 ml RIGHT VENTRICLE RV Basal diam:  3.25 cm RV S prime:     15.90 cm/s TAPSE (M-mode): 2.8 cm LEFT ATRIUM             Index        RIGHT ATRIUM           Index LA diam:        3.90 cm 1.83 cm/m   RA Area:     15.80 cm LA Vol (A2C):   52.7 ml 24.76 ml/m  RA Volume:   35.80 ml  16.82 ml/m LA Vol (A4C):   67.7 ml 31.81 ml/m LA Biplane Vol: 63.0 ml 29.60 ml/m  AORTIC VALVE                    PULMONIC VALVE AV Area (Vmax):    2.52 cm  PV Vmax:       0.75 m/s AV Area (Vmean):   2.33 cm     PV Peak grad:  2.3 mmHg AV Area (VTI):     2.38 cm AV Vmax:           123.00 cm/s AV Vmean:          90.400 cm/s AV VTI:            0.288 m AV Peak Grad:      6.1 mmHg AV Mean Grad:      4.0 mmHg LVOT Vmax:         98.80 cm/s LVOT Vmean:        67.000 cm/s LVOT VTI:          0.218 m LVOT/AV VTI ratio: 0.76  AORTA Ao Root diam: 3.70 cm MITRAL VALVE MV Area (PHT): 4.04 cm    SHUNTS MV Area VTI:   4.72 cm    Systemic VTI:  0.22 m MV Peak grad:  1.9 mmHg    Systemic Diam: 2.00 cm MV Mean grad:  1.0 mmHg MV Vmax:       0.69 m/s MV Vmean:      34.8 cm/s MV Decel Time: 188 msec MV E velocity: 56.10 cm/s MV A velocity: 61.50 cm/s MV E/A ratio:  0.91 Rozann Lesches MD Electronically signed by Rozann Lesches MD Signature Date/Time: 12/03/2020/3:07:25 PM    Final     Cardiac Studies   December 03, 2020  Mid LAD lesion is 25% stenosed.   Mid Cx lesion is 80% stenosed.   A drug-eluting stent was  successfully placed using a STENT ONYX FRONTIER 3.5X22, postdilated to 4.0 mm.   Post intervention, there is a 0% residual stenosis.   Dist Cx lesion is 90% stenosed.  This distal vessel was small.  Plan for medical treatment.   The left ventricular systolic function is normal.   LV end diastolic pressure is mildly elevated.   The left ventricular ejection fraction is 55-65% by visual estimate.   There is no aortic valve stenosis.   NSTEMI likely due to circumflex disease.  Distal vessel with severe lesion but the vessel is small in this distribution.  Large mid circumflex was successfully treated.  Continue aggressive secondary prevention.  Plan for aspirin and Brilinta for 1 month.  Given his other comorbidities, would change Brilinta to clopidogrel after a month.  Would like for him to at a minimum complete 3-6 months of dual antiplatelet therapy total.  Ideally, he would complete a longer duration.  If there were bleeding issues, could adjust before a year.   Results conveyed to his daughter Vito Backers [phone]  December 03, 2020 echocardiogram IMPRESSIONS     1. Left ventricular ejection fraction, by estimation, is 55 to 60%. The  left ventricle has normal function. The left ventricle has no regional  wall motion abnormalities. Left ventricular diastolic parameters were  normal.   2. Right ventricular systolic function is normal. The right ventricular  size is normal. Tricuspid regurgitation signal is inadequate for assessing  PA pressure.   3. The mitral valve is grossly normal. Trivial mitral valve  regurgitation.   4. The aortic valve is grossly normal. Aortic valve regurgitation is not  visualized. No aortic stenosis is present. Aortic valve mean gradient  measures 4.0 mmHg.   5. The inferior vena cava is normal in size with greater than 50%  respiratory variability, suggesting right atrial pressure of 3 mmHg.  Comparison(s): No prior Echocardiogram.   FINDINGS   Left  Ventricle: Left ventricular ejection fraction, by estimation, is 55  to 60%. The left ventricle has normal function. The left ventricle has no  regional wall motion abnormalities. The left ventricular internal cavity  size was normal in size. There is   no left ventricular hypertrophy. Left ventricular diastolic parameters  were normal.   Right Ventricle: The right ventricular size is normal. No increase in  right ventricular wall thickness. Right ventricular systolic function is  normal. Tricuspid regurgitation signal is inadequate for assessing PA  pressure.   Left Atrium: Left atrial size was normal in size.   Right Atrium: Right atrial size was normal in size.   Pericardium: There is no evidence of pericardial effusion.   Mitral Valve: The mitral valve is grossly normal. Trivial mitral valve  regurgitation. MV peak gradient, 1.9 mmHg. The mean mitral valve gradient  is 1.0 mmHg.   Tricuspid Valve: The tricuspid valve is grossly normal. Tricuspid valve  regurgitation is trivial.   Aortic Valve: The aortic valve is grossly normal. There is mild aortic  valve annular calcification. Aortic valve regurgitation is not visualized.  No aortic stenosis is present. Aortic valve mean gradient measures 4.0  mmHg. Aortic valve peak gradient  measures 6.1 mmHg. Aortic valve area, by VTI measures 2.38 cm.   Pulmonic Valve: The pulmonic valve was grossly normal. Pulmonic valve  regurgitation is trivial.   Aorta: The aortic root is normal in size and structure.   Venous: The inferior vena cava is normal in size with greater than 50%  respiratory variability, suggesting right atrial pressure of 3 mmHg.   IAS/Shunts: No atrial level shunt detected by color flow Doppler.         Patient Profile     65 y.o. male with history of coronary artery disease now status post PCI to the mid circumflex, hepatocellular carcinoma, hypertension and hyperlipidemia.  Assessment & Plan     CAD-status post heart catheterization yesterday with drug-eluting stent to his mid circumflex.  Continue aspirin 81 mg daily along with the Brilinta 90 mg twice daily, continue atorvastatin 80 mg daily.  Please add Zetia 10 mg daily as his LDL goal is less than 70 most recent lipid profile shows LDL of 99.  He is also on low-dose beta-blocker as well.  No anginal symptoms.  Hypertension-his blood pressure has not been well controlled while he has been in the hospital.  We will continue the patient on the metoprolol started 12 5 mg daily, however increase the losartan to 100 mg daily.  Stop the amlodipine  Hyperlipidemia as noted above LDL 99.  Currently on Lipitor 80 mg daily please add Zetia 10 mg daily.  [facility] will sign off.   Medication Recommendations: Aspirin 81 mg daily, Brilinta 90 mg twice daily, atorvastatin 80 mg daily, Zetia 10 mg daily, metoprolol tartrate 12.5 mg twice daily, losartan 100 mg daily. Other recommendations (labs, testing, etc): None Follow up as an outpatient: Primary cardiologist  For questions or updates, please contact [place] Please consult [email or web address] for contact info under Cardiology/STEMI.      Signed, Berniece Salines, DO  12/04/2020, 10:48 AM

## 2020-12-04 NOTE — Assessment & Plan Note (Signed)
Chronic. 

## 2020-12-04 NOTE — Assessment & Plan Note (Signed)
Pt ruled in for NSTEMI. Had stent placed on 12-03-2020

## 2020-12-04 NOTE — Assessment & Plan Note (Signed)
Pt continues to c/o of generalized pain. Due to ASA/brilinta therapy, pt cannot take ibuprofen in the large doses he was taking before. Pt states he was taking 3200 mg of ibuprofen at day. 7 day rx for oxycodone 5 mg (q6h prn) was given to patient. Pt will need to f/u with PCP or heme/onc for further treatment of his chronic pain.

## 2020-12-05 ENCOUNTER — Other Ambulatory Visit: Payer: Self-pay

## 2020-12-05 ENCOUNTER — Encounter (HOSPITAL_COMMUNITY): Payer: Self-pay

## 2020-12-05 ENCOUNTER — Emergency Department (HOSPITAL_COMMUNITY)
Admission: EM | Admit: 2020-12-05 | Discharge: 2020-12-05 | Disposition: A | Discharge status: Home / Self Care | Payer: BC Managed Care – PPO | Attending: Emergency Medicine | Admitting: Emergency Medicine

## 2020-12-05 ENCOUNTER — Emergency Department (HOSPITAL_COMMUNITY): Payer: BC Managed Care – PPO

## 2020-12-05 ENCOUNTER — Telehealth: Payer: Self-pay | Admitting: *Deleted

## 2020-12-05 DIAGNOSIS — I10 Essential (primary) hypertension: Secondary | ICD-10-CM | POA: Diagnosis not present

## 2020-12-05 DIAGNOSIS — R0789 Other chest pain: Secondary | ICD-10-CM | POA: Insufficient documentation

## 2020-12-05 DIAGNOSIS — I2510 Atherosclerotic heart disease of native coronary artery without angina pectoris: Secondary | ICD-10-CM | POA: Diagnosis not present

## 2020-12-05 DIAGNOSIS — Z7982 Long term (current) use of aspirin: Secondary | ICD-10-CM | POA: Diagnosis not present

## 2020-12-05 DIAGNOSIS — R112 Nausea with vomiting, unspecified: Secondary | ICD-10-CM | POA: Diagnosis not present

## 2020-12-05 DIAGNOSIS — M25511 Pain in right shoulder: Secondary | ICD-10-CM | POA: Diagnosis not present

## 2020-12-05 DIAGNOSIS — R0602 Shortness of breath: Secondary | ICD-10-CM | POA: Diagnosis not present

## 2020-12-05 DIAGNOSIS — R110 Nausea: Secondary | ICD-10-CM | POA: Insufficient documentation

## 2020-12-05 DIAGNOSIS — M25512 Pain in left shoulder: Secondary | ICD-10-CM | POA: Diagnosis not present

## 2020-12-05 DIAGNOSIS — R778 Other specified abnormalities of plasma proteins: Secondary | ICD-10-CM

## 2020-12-05 DIAGNOSIS — E782 Mixed hyperlipidemia: Secondary | ICD-10-CM | POA: Diagnosis not present

## 2020-12-05 DIAGNOSIS — R079 Chest pain, unspecified: Secondary | ICD-10-CM | POA: Diagnosis not present

## 2020-12-05 DIAGNOSIS — Z79899 Other long term (current) drug therapy: Secondary | ICD-10-CM | POA: Insufficient documentation

## 2020-12-05 DIAGNOSIS — R202 Paresthesia of skin: Secondary | ICD-10-CM | POA: Diagnosis not present

## 2020-12-05 DIAGNOSIS — R0689 Other abnormalities of breathing: Secondary | ICD-10-CM | POA: Diagnosis not present

## 2020-12-05 DIAGNOSIS — Z8505 Personal history of malignant neoplasm of liver: Secondary | ICD-10-CM | POA: Insufficient documentation

## 2020-12-05 DIAGNOSIS — S2231XA Fracture of one rib, right side, initial encounter for closed fracture: Secondary | ICD-10-CM | POA: Diagnosis not present

## 2020-12-05 DIAGNOSIS — R1110 Vomiting, unspecified: Secondary | ICD-10-CM | POA: Diagnosis not present

## 2020-12-05 LAB — CBC WITH DIFFERENTIAL/PLATELET
Abs Immature Granulocytes: 0.04 10*3/uL (ref 0.00–0.07)
Basophils Absolute: 0 10*3/uL (ref 0.0–0.1)
Basophils Relative: 0 %
Eosinophils Absolute: 0.1 10*3/uL (ref 0.0–0.5)
Eosinophils Relative: 1 %
HCT: 39.6 % (ref 39.0–52.0)
Hemoglobin: 14 g/dL (ref 13.0–17.0)
Immature Granulocytes: 0 %
Lymphocytes Relative: 5 %
Lymphs Abs: 0.6 10*3/uL — ABNORMAL LOW (ref 0.7–4.0)
MCH: 29.7 pg (ref 26.0–34.0)
MCHC: 35.4 g/dL (ref 30.0–36.0)
MCV: 83.9 fL (ref 80.0–100.0)
Monocytes Absolute: 1.5 10*3/uL — ABNORMAL HIGH (ref 0.1–1.0)
Monocytes Relative: 12 %
Neutro Abs: 10.3 10*3/uL — ABNORMAL HIGH (ref 1.7–7.7)
Neutrophils Relative %: 82 %
Platelets: 308 10*3/uL (ref 150–400)
RBC: 4.72 MIL/uL (ref 4.22–5.81)
RDW: 13.7 % (ref 11.5–15.5)
WBC: 12.5 10*3/uL — ABNORMAL HIGH (ref 4.0–10.5)
nRBC: 0 % (ref 0.0–0.2)

## 2020-12-05 LAB — COMPREHENSIVE METABOLIC PANEL
ALT: 19 U/L (ref 0–44)
AST: 22 U/L (ref 15–41)
Albumin: 3.5 g/dL (ref 3.5–5.0)
Alkaline Phosphatase: 108 U/L (ref 38–126)
Anion gap: 12 (ref 5–15)
BUN: 9 mg/dL (ref 8–23)
CO2: 22 mmol/L (ref 22–32)
Calcium: 9.6 mg/dL (ref 8.9–10.3)
Chloride: 95 mmol/L — ABNORMAL LOW (ref 98–111)
Creatinine, Ser: 0.79 mg/dL (ref 0.61–1.24)
GFR, Estimated: >60 mL/min (ref >60)
Glucose, Bld: 134 mg/dL — ABNORMAL HIGH (ref 70–99)
Potassium: 4 mmol/L (ref 3.5–5.1)
Sodium: 129 mmol/L — ABNORMAL LOW (ref 135–145)
Total Bilirubin: 2.7 mg/dL — ABNORMAL HIGH (ref 0.3–1.2)
Total Protein: 7.4 g/dL (ref 6.5–8.1)

## 2020-12-05 LAB — MAGNESIUM: Magnesium: 1.8 mg/dL (ref 1.7–2.4)

## 2020-12-05 LAB — BRAIN NATRIURETIC PEPTIDE: B Natriuretic Peptide: 36.3 pg/mL (ref 0.0–100.0)

## 2020-12-05 LAB — PROTIME-INR
INR: 1.1 (ref 0.8–1.2)
Prothrombin Time: 13.8 seconds (ref 11.4–15.2)

## 2020-12-05 LAB — CBG MONITORING, ED: Glucose-Capillary: 137 mg/dL — ABNORMAL HIGH (ref 70–99)

## 2020-12-05 LAB — TROPONIN I (HIGH SENSITIVITY)
Troponin I (High Sensitivity): 340 ng/L (ref <18)
Troponin I (High Sensitivity): 281 ng/L (ref <18)

## 2020-12-05 MED ORDER — PREGABALIN 75 MG PO CAPS: 75.0000 mg | ORAL_CAPSULE | Freq: Two times a day (BID) | ORAL | 0 refills | Status: DC

## 2020-12-05 MED ORDER — ASPIRIN 81 MG PO CHEW: 324.0000 mg | CHEWABLE_TABLET | Freq: Once | ORAL | Status: DC

## 2020-12-05 MED ORDER — PREGABALIN 25 MG PO CAPS
75.0000 mg | ORAL_CAPSULE | Freq: Once | ORAL | Status: AC
  Administered 2020-12-05: 75 mg via ORAL
  Filled 2020-12-05: qty 3

## 2020-12-05 MED ORDER — IOHEXOL 300 MG/ML  SOLN
100.0000 mL | Freq: Once | INTRAMUSCULAR | Status: AC | PRN
  Administered 2020-12-05: 100 mL via INTRAVENOUS

## 2020-12-05 NOTE — Consult Note (Signed)
Cardiology Consultation:   Patient ID: Logan Alvarez MRN: [id number]; DOB: 24-Apr-1955  Admit date: 12/05/2020 Date of Consult: 12/05/2020  PCP:  Kathyrn Drown, MD   [facility] Providers Cardiologist:  Jenkins Rouge, MD        Patient Profile:   Logan Alvarez is a 65 y.o. male with a hx of coronary artery disease status post PCI to the mid left circumflex, hepatocellular carcinoma, hyperlipidemia and hypertension. Who is being seen 12/05/2020 for the evaluation of elevated troponin at the request of Dr.  Carmin Muskrat.  History of Present Illness:   Logan Alvarez tells me that he presented to the emergency department due to persistent nausea.  He notes that since midnight yesterday he started experiencing intermittent nauseousness which did not resolve up until this morning.  He has had some chest tightness but tells me this has been longstanding and is not new.  The nauseousness was the most bothersome symptom given his recent procedure he decided to come to the ED to be evaluated.  Of note the patient was initially admitted at [facility] where he was diagnosed with NSTEMI transferred to [facility] where he underwent a left heart catheterization and subsequently percutaneous intervention in the mid left circumflex artery.  He did well with his procedure.  He was medically stable yesterday and was cleared for discharge to home.  He was discharged home without any incident.  On presentation to the ED at [facility] today his lab work showed troponin 340 and cardiology consult has been recommended.  I saw the patient by his bedside with his daughter at the bedside.  And he denies any shortness of breath, any new chest discomfort lightheadedness or dizziness.   Past Medical History:  Diagnosis Date   CAD (coronary artery disease)    01/2013 CTA: Ca score of 715 (92%'ile), LAD 50p, D1 50-75, RCA 50p.   Hepatocellular carcinoma ([place])    Stage IV    Hyperlipidemia    Hypertension    Testosterone deficiency 2009    Past Surgical History:  Procedure Laterality Date   BALLOON DILATION  07/30/2020   Procedure: BALLOON DILATION;  Surgeon: Eloise Harman, DO;  [facility];  Service: Endoscopy;;   COLONOSCOPY N/A 03/12/2017   Procedure: COLONOSCOPY;  Surgeon: Danie Binder, MD;  [facility];  Service: Endoscopy;  Laterality: N/A;  2:00   COLONOSCOPY WITH PROPOFOL N/A 06/18/2020   Procedure: COLONOSCOPY WITH PROPOFOL;  Surgeon: Eloise Harman, DO;  [facility];  Service: Endoscopy;  Laterality: N/A;  pm appt   CORONARY STENT INTERVENTION N/A 12/03/2020   Procedure: CORONARY STENT INTERVENTION;  Surgeon: Jettie Booze, MD;  Location: [place] [facility];  Service: Cardiovascular;  Laterality: N/A;   ESOPHAGOGASTRODUODENOSCOPY (EGD) WITH PROPOFOL N/A 07/30/2020   Procedure: ESOPHAGOGASTRODUODENOSCOPY (EGD) WITH PROPOFOL;  Surgeon: Eloise Harman, DO;  [facility];  Service: Endoscopy;  Laterality: N/A;  11:45am   LEFT HEART CATH AND CORONARY ANGIOGRAPHY N/A 12/03/2020   Procedure: LEFT HEART CATH AND CORONARY ANGIOGRAPHY;  Surgeon: Jettie Booze, MD;  Location: [place] [facility];  Service: Cardiovascular;  Laterality: N/A;   LEFT HEART CATHETERIZATION WITH CORONARY ANGIOGRAM N/A 02/01/2013   Procedure: LEFT HEART CATHETERIZATION WITH CORONARY ANGIOGRAM;  Surgeon: Josue Hector, MD;  Location: [facility] CATH LAB;  Service: Cardiovascular;  Laterality: N/A;   NECK SURGERY     POLYPECTOMY  06/18/2020   Procedure:  POLYPECTOMY;  Surgeon: Eloise Harman, DO;  [facility];  Service: Endoscopy;;  snare and biospy       Inpatient Medications: Scheduled Meds:  aspirin  324 mg Oral Once   Continuous Infusions:  PRN Meds:   Allergies:    Allergies  Allergen Reactions   Cymbalta [Duloxetine Hcl]     Bad dreams   Lodine [Etodolac] Nausea And Vomiting   Zoloft  [Sertraline Hcl]     REDUCED URINARY FLOW,NOCTURIA    Social History:   Social History   Socioeconomic History   Marital status: Widowed    Spouse name: Not on file   Number of children: Not on file   Years of education: Not on file   Highest education level: Not on file  Occupational History   Not on file  Tobacco Use   Smoking status: Never   Smokeless tobacco: Never  Vaping Use   Vaping Use: Never used  Substance and Sexual Activity   Alcohol use: Yes    Comment: 4-6 beers a day   Drug use: No   Sexual activity: Not on file  Other Topics Concern   Not on file  Social History Narrative   Not on file   Social Determinants of Health   Financial Resource Strain: Not on file  Food Insecurity: Not on file  Transportation Needs: Not on file  Physical Activity: Not on file  Stress: Not on file  Social Connections: Not on file  Intimate Partner Violence: Not on file    Family History:    Family History  Problem Relation Age of Onset   Heart attack Father        32's     ROS:  Please see the history of present illness.  Review of Systems  Constitution: Negative for decreased appetite, fever and weight gain.  HENT: Negative for congestion, ear discharge, hoarse voice and sore throat.   Eyes: Negative for discharge, redness, vision loss in right eye and visual halos.  Cardiovascular: Negative for chest pain, dyspnea on exertion, leg swelling, orthopnea and palpitations.  Respiratory: Negative for cough, hemoptysis, shortness of breath and snoring.   Endocrine: Negative for heat intolerance and polyphagia.  Hematologic/Lymphatic: Negative for bleeding problem. Does not bruise/bleed easily.  Skin: Negative for flushing, nail changes, rash and suspicious lesions.  Musculoskeletal: Negative for arthritis, joint pain, muscle cramps, myalgias, neck pain and stiffness.  Gastrointestinal: Reports nausea.  Negative for abdominal pain, bowel incontinence, diarrhea and  excessive appetite.  Genitourinary: Negative for decreased libido, genital sores and incomplete emptying.  Neurological: Negative for brief paralysis, focal weakness, headaches and loss of balance.  Psychiatric/Behavioral: Negative for altered mental status, depression and suicidal ideas.  Allergic/Immunologic: Negative for HIV exposure and persistent infections.    Physical Exam/Data:   Vitals:   12/05/20 1000 12/05/20 1030 12/05/20 1045 12/05/20 1130  BP: 138/87 135/81 (!) 155/82 118/81  Pulse: 82 87 95 87  Resp: 15 14 16 16   Temp:      TempSrc:      SpO2: 96% 96% 98% 95%  Weight:      Height:       No intake or output data in the 24 hours ending 12/05/20 1156 Last 3 Weights 12/05/2020 12/02/2020 12/02/2020  Weight (lbs) 215 lb 220 lb 220 lb 0.3 oz  Weight (kg) 97.523 kg 99.791 kg 99.8 kg     Body mass index is 32.69 kg/m.  General:  Well nourished, well developed, in no  acute distress HEENT: normal Neck: no JVD Vascular: No carotid bruits; Distal pulses 2+ bilaterally Cardiac:  normal S1, S2; RRR; no murmur  Lungs:  clear to auscultation bilaterally, no wheezing, rhonchi or rales  Abd: soft, nontender, no hepatomegaly  Ext: no edema Musculoskeletal:  No deformities, BUE and BLE strength normal and equal Skin: warm and dry  Neuro:  CNs 2-12 intact, no focal abnormalities noted Psych:  Normal affect   EKG:  The EKG was personally reviewed and demonstrates: Sinus rhythm, heart rate 82 bpm with no ST segment changes compared to prior EKG no significant change.  Telemetry:  Telemetry was personally reviewed and demonstrates: Sinus rhythm heart rate ranging between the 80s and 90s.  Relevant CV Studies: LHC 12/03/2020   Mid LAD lesion is 25% stenosed.   Mid Cx lesion is 80% stenosed.   A drug-eluting stent was successfully placed using a STENT ONYX FRONTIER 3.5X22, postdilated to 4.0 mm.   Post intervention, there is a 0% residual stenosis.   Dist Cx lesion is 90%  stenosed.  This distal vessel was small.  Plan for medical treatment.   The left ventricular systolic function is normal.   LV end diastolic pressure is mildly elevated.   The left ventricular ejection fraction is 55-65% by visual estimate.   There is no aortic valve stenosis.   NSTEMI likely due to circumflex disease.  Distal vessel with severe lesion but the vessel is small in this distribution.  Large mid circumflex was successfully treated.  Continue aggressive secondary prevention.  Plan for aspirin and Brilinta for 1 month.  Given his other comorbidities, would change Brilinta to clopidogrel after a month.  Would like for him to at a minimum complete 3-6 months of dual antiplatelet therapy total.  Ideally, he would complete a longer duration.  If there were bleeding issues, could adjust before a year.   Results conveyed to his daughter Vito Backers [phone]  Echocardiogram 12/03/2020 IMPRESSIONS   1. Left ventricular ejection fraction, by estimation, is 55 to 60%. The  left ventricle has normal function. The left ventricle has no regional  wall motion abnormalities. Left ventricular diastolic parameters were  normal.   2. Right ventricular systolic function is normal. The right ventricular  size is normal. Tricuspid regurgitation signal is inadequate for assessing  PA pressure.   3. The mitral valve is grossly normal. Trivial mitral valve  regurgitation.   4. The aortic valve is grossly normal. Aortic valve regurgitation is not  visualized. No aortic stenosis is present. Aortic valve mean gradient  measures 4.0 mmHg.   5. The inferior vena cava is normal in size with greater than 50%  respiratory variability, suggesting right atrial pressure of 3 mmHg.   Comparison(s): No prior Echocardiogram.   FINDINGS   Left Ventricle: Left ventricular ejection fraction, by estimation, is 55  to 60%. The left ventricle has normal function. The left ventricle has no  regional wall motion  abnormalities. The left ventricular internal cavity  size was normal in size. There is   no left ventricular hypertrophy. Left ventricular diastolic parameters  were normal.   Right Ventricle: The right ventricular size is normal. No increase in  right ventricular wall thickness. Right ventricular systolic function is  normal. Tricuspid regurgitation signal is inadequate for assessing PA  pressure.   Left Atrium: Left atrial size was normal in size.   Right Atrium: Right atrial size was normal in size.   Pericardium: There is no evidence of pericardial effusion.  Mitral Valve: The mitral valve is grossly normal. Trivial mitral valve  regurgitation. MV peak gradient, 1.9 mmHg. The mean mitral valve gradient  is 1.0 mmHg.   Tricuspid Valve: The tricuspid valve is grossly normal. Tricuspid valve  regurgitation is trivial.   Aortic Valve: The aortic valve is grossly normal. There is mild aortic  valve annular calcification. Aortic valve regurgitation is not visualized.  No aortic stenosis is present. Aortic valve mean gradient measures 4.0  mmHg. Aortic valve peak gradient  measures 6.1 mmHg. Aortic valve area, by VTI measures 2.38 cm.   Pulmonic Valve: The pulmonic valve was grossly normal. Pulmonic valve  regurgitation is trivial.   Aorta: The aortic root is normal in size and structure.   Venous: The inferior vena cava is normal in size with greater than 50%  respiratory variability, suggesting right atrial pressure of 3 mmHg.   IAS/Shunts: No atrial level shunt detected by color flow Doppler.   Laboratory Data:  High Sensitivity Troponin:   Recent Labs  Lab 12/02/20 1126 12/02/20 1313 12/03/20 0700 12/05/20 0839  TROPONINIHS 3,068* 2,916* 1,244* 340*     Chemistry Recent Labs  Lab 12/03/20 0433 12/04/20 0239 12/05/20 0839  NA 129* 127* 129*  K 4.3 4.1 4.0  CL 95* 97* 95*  CO2 26 22 22   GLUCOSE 117* 110* 134*  BUN 10 8 9   CREATININE 0.70 0.71 0.79   CALCIUM 9.3 9.1 9.6  MG 2.3  --  1.8  GFRNONAA >60 >60 >60  ANIONGAP 8 8 12     Recent Labs  Lab 12/03/20 0433 12/04/20 0239 12/05/20 0839  PROT 7.6 6.6 7.4  ALBUMIN 3.7 3.1* 3.5  AST 28 22 22   ALT 17 17 19   ALKPHOS 103 99 108  BILITOT 1.5* 2.0* 2.7*   Lipids  Recent Labs  Lab 12/03/20 0433  CHOL 158  TRIG 116  HDL 36*  LDLCALC 99  CHOLHDL 4.4    Hematology Recent Labs  Lab 12/03/20 0433 12/04/20 0239 12/05/20 0839  WBC 10.7* 10.4 12.5*  RBC 4.78 4.43 4.72  HGB 14.1 13.3 14.0  HCT 41.6 38.1* 39.6  MCV 87.0 86.0 83.9  MCH 29.5 30.0 29.7  MCHC 33.9 34.9 35.4  RDW 14.3 14.2 13.7  PLT 375 296 308   Thyroid  Recent Labs  Lab 12/02/20 1126  TSH 5.299*    BNP Recent Labs  Lab 12/05/20 0839  BNP 36.3    DDimer No results for input(s): DDIMER in the last 168 hours.   Radiology/Studies:  DG Chest 2 View  Result Date: 12/02/2020 CLINICAL DATA:  Chest pain EXAM: CHEST - 2 VIEW COMPARISON:  04/14/2011 FINDINGS: Heart size is normal. Mild tortuosity of the aorta. The lungs are clear. The pulmonary vascularity is normal. No pleural effusion. No pneumothorax. Right lateral inferior costophrenic angle is not included on the frontal view. Old healed rib fractures on the right at ribs 6, 7 and 8. Ordinary thoracic degenerative changes. Previous ACDF. IMPRESSION: No active disease. Old healed rib fractures on the right. Slightly tortuous aorta. Electronically Signed   By: Nelson Chimes M.D.   On: 12/02/2020 11:27   CARDIAC CATHETERIZATION  Result Date: 12/03/2020   Mid LAD lesion is 25% stenosed.   Mid Cx lesion is 80% stenosed.   A drug-eluting stent was successfully placed using a STENT ONYX FRONTIER 3.5X22, postdilated to 4.0 mm.   Post intervention, there is a 0% residual stenosis.   Dist Cx lesion is 90% stenosed.  This  distal vessel was small.  Plan for medical treatment.   The left ventricular systolic function is normal.   LV end diastolic pressure is mildly  elevated.   The left ventricular ejection fraction is 55-65% by visual estimate.   There is no aortic valve stenosis. NSTEMI likely due to circumflex disease.  Distal vessel with severe lesion but the vessel is small in this distribution.  Large mid circumflex was successfully treated.  Continue aggressive secondary prevention. Plan for aspirin and Brilinta for 1 month.  Given his other comorbidities, would change Brilinta to clopidogrel after a month.  Would like for him to at a minimum complete 3-6 months of dual antiplatelet therapy total.  Ideally, he would complete a longer duration.  If there were bleeding issues, could adjust before a year. Results conveyed to his daughter Vito Backers [phone]   DG Chest Portable 1 View  Result Date: 12/05/2020 CLINICAL DATA:  Chest pain. EXAM: PORTABLE CHEST 1 VIEW COMPARISON:  12/02/2020 FINDINGS: Numerous leads and wires project over the chest. Remote right rib fractures. Apical lordotic positioning. Incompletely imaged cervical spine fixation hardware. Midline trachea. Normal heart size and mediastinal contours. No pleural effusion or pneumothorax. Clear lungs. IMPRESSION: No active disease. Electronically Signed   By: Abigail Miyamoto M.D.   On: 12/05/2020 08:45   ECHOCARDIOGRAM COMPLETE  Result Date: 12/03/2020    ECHOCARDIOGRAM REPORT   Patient Name:   CARLISLE ENKE Date of Exam: 12/03/2020 Medical Rec #:  [id number]      Height:       68.0 in Accession #:    [id number]     Weight:       220.0 lb Date of Birth:  1955/12/29       BSA:          2.128 m Patient Age:    81 years       BP:           125/79 mmHg Patient Gender: M              HR:           76 bpm. Exam Location:  [facility] Procedure: 2D Echo, Cardiac Doppler and Color Doppler Indications:    Acute Myocardial infarction  History:        Patient has prior history of Echocardiogram examinations, most                 recent 04/24/2011. CAD; Risk Factors:Hypertension.  Sonographer:    Wenda Low Referring  Phys: [place]  1. Left ventricular ejection fraction, by estimation, is 55 to 60%. The left ventricle has normal function. The left ventricle has no regional wall motion abnormalities. Left ventricular diastolic parameters were normal.  2. Right ventricular systolic function is normal. The right ventricular size is normal. Tricuspid regurgitation signal is inadequate for assessing PA pressure.  3. The mitral valve is grossly normal. Trivial mitral valve regurgitation.  4. The aortic valve is grossly normal. Aortic valve regurgitation is not visualized. No aortic stenosis is present. Aortic valve mean gradient measures 4.0 mmHg.  5. The inferior vena cava is normal in size with greater than 50% respiratory variability, suggesting right atrial pressure of 3 mmHg. Comparison(s): No prior Echocardiogram. FINDINGS  Left Ventricle: Left ventricular ejection fraction, by estimation, is 55 to 60%. The left ventricle has normal function. The left ventricle has no regional wall motion abnormalities. The left ventricular internal cavity size was normal in size. There is  no left ventricular hypertrophy. Left ventricular diastolic parameters were normal. Right Ventricle: The right ventricular size is normal. No increase in right ventricular wall thickness. Right ventricular systolic function is normal. Tricuspid regurgitation signal is inadequate for assessing PA pressure. Left Atrium: Left atrial size was normal in size. Right Atrium: Right atrial size was normal in size. Pericardium: There is no evidence of pericardial effusion. Mitral Valve: The mitral valve is grossly normal. Trivial mitral valve regurgitation. MV peak gradient, 1.9 mmHg. The mean mitral valve gradient is 1.0 mmHg. Tricuspid Valve: The tricuspid valve is grossly normal. Tricuspid valve regurgitation is trivial. Aortic Valve: The aortic valve is grossly normal. There is mild aortic valve annular calcification. Aortic valve  regurgitation is not visualized. No aortic stenosis is present. Aortic valve mean gradient measures 4.0 mmHg. Aortic valve peak gradient measures 6.1 mmHg. Aortic valve area, by VTI measures 2.38 cm. Pulmonic Valve: The pulmonic valve was grossly normal. Pulmonic valve regurgitation is trivial. Aorta: The aortic root is normal in size and structure. Venous: The inferior vena cava is normal in size with greater than 50% respiratory variability, suggesting right atrial pressure of 3 mmHg. IAS/Shunts: No atrial level shunt detected by color flow Doppler.  LEFT VENTRICLE PLAX 2D LVIDd:         4.70 cm     Diastology LVIDs:         3.40 cm     LV e' medial:    6.65 cm/s LV PW:         1.00 cm     LV E/e' medial:  8.4 LV IVS:        0.90 cm     LV e' lateral:   7.87 cm/s LVOT diam:     2.00 cm     LV E/e' lateral: 7.1 LV SV:         68 LV SV Index:   32 LVOT Area:     3.14 cm  LV Volumes (MOD) LV vol d, MOD A2C: 51.1 ml LV vol d, MOD A4C: 82.1 ml LV vol s, MOD A2C: 24.3 ml LV vol s, MOD A4C: 36.9 ml LV SV MOD A2C:     26.8 ml LV SV MOD A4C:     82.1 ml LV SV MOD BP:      36.3 ml RIGHT VENTRICLE RV Basal diam:  3.25 cm RV S prime:     15.90 cm/s TAPSE (M-mode): 2.8 cm LEFT ATRIUM             Index        RIGHT ATRIUM           Index LA diam:        3.90 cm 1.83 cm/m   RA Area:     15.80 cm LA Vol (A2C):   52.7 ml 24.76 ml/m  RA Volume:   35.80 ml  16.82 ml/m LA Vol (A4C):   67.7 ml 31.81 ml/m LA Biplane Vol: 63.0 ml 29.60 ml/m  AORTIC VALVE                    PULMONIC VALVE AV Area (Vmax):    2.52 cm     PV Vmax:       0.75 m/s AV Area (Vmean):   2.33 cm     PV Peak grad:  2.3 mmHg AV Area (VTI):     2.38 cm AV Vmax:           123.00 cm/s AV  Vmean:          90.400 cm/s AV VTI:            0.288 m AV Peak Grad:      6.1 mmHg AV Mean Grad:      4.0 mmHg LVOT Vmax:         98.80 cm/s LVOT Vmean:        67.000 cm/s LVOT VTI:          0.218 m LVOT/AV VTI ratio: 0.76  AORTA Ao Root diam: 3.70 cm MITRAL VALVE MV Area  (PHT): 4.04 cm    SHUNTS MV Area VTI:   4.72 cm    Systemic VTI:  0.22 m MV Peak grad:  1.9 mmHg    Systemic Diam: 2.00 cm MV Mean grad:  1.0 mmHg MV Vmax:       0.69 m/s MV Vmean:      34.8 cm/s MV Decel Time: 188 msec MV E velocity: 56.10 cm/s MV A velocity: 61.50 cm/s MV E/A ratio:  0.91 Rozann Lesches MD Electronically signed by Rozann Lesches MD Signature Date/Time: 12/03/2020/3:07:25 PM    Final      Assessment and Plan:   Elevated troponin-this is trending downwards in the setting of post NSTEMI and recent PCI.  He is completely asymptomatic.  The patient looks better than he did when he transferred to [facility] and especially when he was being discharged.  He denies any chest pain or shortness of breath.  His EKG does not show any significant changes.  In the setting of his decreased troponin and the fact that he is asymptomatic for any anginal symptoms there is no need for any further cardiovascular work-up at this time.  He is to continue his dual antiplatelet therapy for minimum of 12 months.  Continue his lipid-lowering agents as well. CAD-stable no anginal symptoms. Nausea-this needs to be addressed.  He may have undiagnosed GERD.  I have talked with the ED team and they are going to address this with likely Zofran. His chest tightness which is not different from his baseline is being managed by the ED team and likely outpatient his primary doctor.  Unfortunately the patient has hepatocellular carcinoma which be stimulating neuropathic pain. Hyperlipidemia-continue his lipid-lowering agents. Hepatocellular carcinoma-treatment plan deferred to his outpatient doctors   Risk Assessment/Risk Scores:           [facility] will sign off.   Medication Recommendations: Aspirin 81 mg daily, Brilinta 80 mg twice a day, atorvastatin 80 mg daily, Zetia 10 mg daily, metoprolol 12.  12.5 mg twice a day, losartan 100 mg daily.   Other recommendations (labs, testing, etc):  None Follow up as an outpatient: Primary cardiologist, PCP and heme-onc  For questions or updates, please contact [facility] Please consult [email or web address] for contact info under    Signed, Berniece Salines, DO  12/05/2020 11:56 AM

## 2020-12-05 NOTE — ED Triage Notes (Signed)
Pt BIB [facility] from home c/o CP that started at 1am along with some nausea. Pt states he took some tums but did not help. Pt also states he took ASA at 6am.   140 CBG  135/82 BP 90 HR 20 RR 99 RA

## 2020-12-05 NOTE — ED Provider Notes (Signed)
[facility] EMERGENCY DEPARTMENT Provider Note   CSN: [id number] Arrival date & time: 12/05/20  0810     History Chief Complaint  Patient presents with   Chest Pain    Logan Alvarez is a 65 y.o. male.  HPI Logan Alvarez is a 65 y.o. male with medical history significant for stage IV hepatocellular carcinoma, coronary artery disease, hyperlipidemia, hypertension, testosterone deficiency who arrives via EMS due to chest tightness and nausea. His history for CAD is notable and that he had a stent placed earlier in the week.  He states that he was recovering well.  However, about 7 hours ago he awoke with nausea.  Soon thereafter noticed chest tightness.  He acknowledges some difficulty with discerning tightness, numbness, dysesthesia in his extremities and chest secondary to his malignancy and immunotherapy.  However, with sensation that this was different, and with persistent nausea he called EMS.  No focal pain in addition to the tightness.  No syncope, no vomiting.  He has been taking his other medication as directed and states that he has been compliant with new cardiovascular medication recommendations as well.  EMS reports rhythm strip unremarkable, no hypotension, no fever, no tachypnea, no hypoxia.   Review notable for stent placed earlier this week.    Past Medical History:  Diagnosis Date   CAD (coronary artery disease)    01/2013 CTA: Ca score of 715 (92%'ile), LAD 50p, D1 50-75, RCA 50p.   Hepatocellular carcinoma ([place])    Stage IV   Hyperlipidemia    Hypertension    Testosterone deficiency 2009    Patient Active Problem List   Diagnosis Date Noted   NSTEMI (non-ST elevated myocardial infarction) ([place]) 12/02/2020   CAD (coronary artery disease)    SIADH (syndrome of inappropriate ADH production) ([place])    Hyponatremia    Elevated troponin    Elevated CEA    Elevated CA 19-9 level    NSAID long-term use    Erectile dysfunction 11/11/2018    Hepatocellular carcinoma ([place]) 11/12/2017   Special screening for malignant neoplasms, colon    Closed intra-articular fracture of right calcaneus, sequela 03/03/2017   Arthritis of midtarsal joint of right foot 03/03/2017   Testosterone deficiency 05/27/2012   Chest pain 04/15/2011   HTN (hypertension) 04/15/2011   Mixed hyperlipidemia 04/15/2011   Anxiety 04/15/2011    Past Surgical History:  Procedure Laterality Date   BALLOON DILATION  07/30/2020   Procedure: BALLOON DILATION;  Surgeon: Eloise Harman, DO;  [facility] END[facility];  Service: Endoscopy;;   COLONOSCOPY N/A 03/12/2017   Procedure: COLONOSCOPY;  Surgeon: Danie Binder, MD;  Location: [facility];  Service: Endoscopy;  Laterality: N/A;  2:00   COLONOSCOPY WITH PROPOFOL N/A 06/18/2020   Procedure: COLONOSCOPY WITH PROPOFOL;  Surgeon: Eloise Harman, DO;  Location: [facility];  Service: Endoscopy;  Laterality: N/A;  pm appt   CORONARY STENT INTERVENTION N/A 12/03/2020   Procedure: CORONARY STENT INTERVENTION;  Surgeon: Jettie Booze, MD;  Location: [place] CV LAB;  Service: Cardiovascular;  Laterality: N/A;   ESOPHAGOGASTRODUODENOSCOPY (EGD) WITH PROPOFOL N/A 07/30/2020   Procedure: ESOPHAGOGASTRODUODENOSCOPY (EGD) WITH PROPOFOL;  Surgeon: Eloise Harman, DO;  Location[facility];  Service: Endoscopy;  Laterality: N/A;  11:45am   LEFT HEART CATH AND CORONARY ANGIOGRAPHY N/A 12/03/2020   Procedure: LEFT HEART CATH AND CORONARY ANGIOGRAPHY;  Surgeon: Jettie Booze, MD;  Location: [place] CV LAB;  Service: Cardiovascular;  Laterality: N/A;   LEFT HEART CATHETERIZATION WITH CORONARY ANGIOGRAM N/A 02/01/2013   Procedure: LEFT HEART CATHETERIZATION WITH CORONARY ANGIOGRAM;  Surgeon: Josue Hector, MD;  Location: [facility] CATH LAB;  Service: Cardiovascular;  Laterality: N/A;   NECK SURGERY     POLYPECTOMY  06/18/2020   Procedure: POLYPECTOMY;  Surgeon: Eloise Harman, DO;  Location: [facility];   Service: Endoscopy;;  snare and biospy       Family History  Problem Relation Age of Onset   Heart attack Father        72's    Social History   Tobacco Use   Smoking status: Never   Smokeless tobacco: Never  Vaping Use   Vaping Use: Never used  Substance Use Topics   Alcohol use: Yes    Comment: 4-6 beers a day   Drug use: No    Home Medications Prior to Admission medications   Medication Sig Start Date End Date Taking? Authorizing Provider  pregabalin (LYRICA) 75 MG capsule Take 1 capsule (75 mg total) by mouth 2 (two) times daily. 12/05/20  Yes Carmin Muskrat, MD  pregabalin (LYRICA) 75 MG capsule Take 1 capsule (75 mg total) by mouth 2 (two) times daily. 12/05/20  Yes Carmin Muskrat, MD  ALPRAZolam Duanne Moron) 0.5 MG tablet 1/2 tablet to 1 tablet up to 3 times a day as needed for anxiety 09/24/20   Kathyrn Drown, MD  aspirin EC 81 MG tablet Take 1 tablet (81 mg total) by mouth daily. Swallow whole. 12/04/20   Kristopher Oppenheim, DO  atorvastatin (LIPITOR) 80 MG tablet Take 1 tablet (80 mg total) by mouth daily at 6 (six) AM. 12/05/20 01/04/21  Kristopher Oppenheim, DO  dextromethorphan-guaiFENesin (ROBITUSSIN-DM) 10-100 MG/5ML liquid Take by mouth.    [provider]  ezetimibe (ZETIA) 10 MG tablet Take 1 tablet (10 mg total) by mouth daily. 12/04/20 12/04/21  Kristopher Oppenheim, DO  fluticasone [facility]) 50 MCG/ACT nasal spray Place into the nose.    [provider]  losartan (COZAAR) 50 MG tablet Take 1 tablet (50 mg total) by mouth daily. 10/21/20   Kathyrn Drown, MD  metoprolol tartrate (LOPRESSOR) 25 MG tablet Take 0.5 tablets (12.5 mg total) by mouth 2 (two) times daily. 12/04/20 01/03/21  Kristopher Oppenheim, DO  oxyCODONE (OXY IR/ROXICODONE) 5 MG immediate release tablet Take 1 tablet (5 mg total) by mouth every 6 (six) hours as needed for up to 7 days for moderate pain. 12/04/20 12/11/20  Kristopher Oppenheim, DO  sodium chloride (OCEAN) 0.65 % nasal spray 1 spray as needed for Congestion    [provider]  ticagrelor (BRILINTA) 90 MG TABS tablet Take 1 tablet (90 mg total) by mouth 2 (two) times daily. 12/04/20 01/03/21  Kristopher Oppenheim, DO    Allergies    Cymbalta [duloxetine hcl], Lodine [etodolac], and Zoloft [sertraline hcl]  Review of Systems   Review of Systems  Constitutional:        Per HPI, otherwise negative  HENT:         Per HPI, otherwise negative  Respiratory:         Per HPI, otherwise negative  Cardiovascular:        Per HPI, otherwise negative  Gastrointestinal:  Negative for vomiting.  Endocrine:       Negative aside from HPI  Genitourinary:        Neg aside from HPI   Musculoskeletal:        Per HPI, otherwise negative  Skin: Negative.   Allergic/Immunologic: Positive for immunocompromised state.  Neurological:  Negative for syncope.   Physical Exam Updated Vital Signs BP (!) 144/87   Pulse 91   Temp 99 F (37.2 C) (Oral)   Resp 13   Ht 5\' 8"  (1.727 m)   Wt 97.5 kg   SpO2 97%   BMI 32.69 kg/m   Physical Exam Vitals and nursing note reviewed.  Constitutional:      General: He is not in acute distress.    Appearance: He is well-developed.  HENT:     Head: Normocephalic and atraumatic.  Eyes:     Conjunctiva/sclera: Conjunctivae normal.  Cardiovascular:     Rate and Rhythm: Normal rate and regular rhythm.  Pulmonary:     Effort: Pulmonary effort is normal. No respiratory distress.     Breath sounds: No stridor.  Abdominal:     General: There is no distension.  Skin:    General: Skin is warm and dry.  Neurological:     Mental Status: He is alert and oriented to person, place, and time.    ED Results / Procedures / Treatments   Labs (all labs ordered are listed, but only abnormal results are displayed) Labs Reviewed  COMPREHENSIVE METABOLIC PANEL - Abnormal; Notable for the following components:      Result Value   Sodium 129 (*)    Chloride 95 (*)    Glucose, Bld 134 (*)    Total Bilirubin 2.7 (*)    All other components  within normal limits  CBC WITH DIFFERENTIAL/PLATELET - Abnormal; Notable for the following components:   WBC 12.5 (*)    Neutro Abs 10.3 (*)    Lymphs Abs 0.6 (*)    Monocytes Absolute 1.5 (*)    All other components within normal limits  CBG MONITORING, ED - Abnormal; Notable for the following components:   Glucose-Capillary 137 (*)    All other components within normal limits  TROPONIN I (HIGH SENSITIVITY) - Abnormal; Notable for the following components:   Troponin I (High Sensitivity) 340 (*)    All other components within normal limits  TROPONIN I (HIGH SENSITIVITY) - Abnormal; Notable for the following components:   Troponin I (High Sensitivity) 281 (*)    All other components within normal limits  MAGNESIUM  BRAIN NATRIURETIC PEPTIDE  PROTIME-INR    EKG Sinus rhythm, rate 90.  Borderline right axis deviation, otherwise unremarkable EKG.  Radiology CARDIAC CATHETERIZATION 12/03/20  Result Date: 12/03/2020   Mid LAD lesion is 25% stenosed.   Mid Cx lesion is 80% stenosed.   A drug-eluting stent was successfully placed using a STENT ONYX FRONTIER [id number], postdilated to 4.0 mm.   Post intervention, there is a 0% residual stenosis.   Dist Cx lesion is 90% stenosed.  This distal vessel was small.  Plan for medical treatment.   The left ventricular systolic function is normal.   LV end diastolic pressure is mildly elevated.   The left ventricular ejection fraction is 55-65% by visual estimate.   There is no aortic valve stenosis. NSTEMI likely due to circumflex disease.  Distal vessel with severe lesion but the vessel is small in this distribution.  Large mid circumflex was successfully treated.  ECHOCARDIOGRAM COMPLETE  Result Date: 12/03/2020    ECHOCARDIOGRAM REPORT   Patient Name:   Logan Alvarez Date of Exam: 12/03/2020 Medical Rec #:  [id number]      Height:       68.0 in Accession #:  [id number]     Weight:       220.0 lb Date of Birth:  Dec 30, 1955       BSA:          2.128 m  Patient Age:    25 years       BP:           125/79 mmHg Patient Gender: M              HR:           76 bpm. Exam Location:  [facility] Procedure: 2D Echo, Cardiac Doppler and Color Doppler Indications:    Acute Myocardial infarction  History:        Patient has prior history of Echocardiogram examinations, most                 recent 04/24/2011. CAD; Risk Factors:Hypertension.  Sonographer:    Wenda Low Referring Phys: [place]  1. Left ventricular ejection fraction, by estimation, is 55 to 60%. The left ventricle has normal function. The left ventricle has no regional wall motion abnormalities. Left ventricular diastolic parameters were normal.  2. Right ventricular systolic function is normal. The right ventricular size is normal. Tricuspid regurgitation signal is inadequate for assessing PA pressure.  3. The mitral valve is grossly normal. Trivial mitral valve regurgitation.  4. The aortic valve is grossly normal. Aortic valve regurgitation is not visualized. No aortic stenosis is present. Aortic valve mean gradient measures 4.0 mmHg.  5. The inferior vena cava is normal in size with greater than 50% respiratory variability, suggesting right atrial pressure of 3 mmHg.    TODAY - CT Abd/pel:   IMPRESSION: 1. No acute abnormality in the abdomen or pelvis. 2. Persistent omental thickening with evidence of peritoneal metastatic disease. These findings have minimally changed since 10/04/2020. In addition, there is a persistent superficial nodule along the right lateral abdomen but this area is poorly characterized due to motion. 3.  Aortic Atherosclerosis (ICD10-I70.0).   Procedures   Medications Ordered in ED Medications  aspirin chewable tablet 324 mg (324 mg Oral Not Given 12/05/20 0856)  pregabalin (LYRICA) capsule 75 mg (has no administration in time range)  iohexol (OMNIPAQUE) 300 MG/ML solution 100 mL (100 mLs Intravenous Contrast Given 12/05/20 1245)     ED Course  I have reviewed the triage vital signs and the nursing notes.  Pertinent labs & imaging results that were available during my care of the patient were reviewed by me and considered in my medical decision making (see chart for details).  Cardiac monitor 90s sinus rhythm normal Pulse ox 100% room air normal   On repeat exam patient is in similar condition.  I discussed this case with our cardiology colleagues, and they have evaluated the patient.  First troponin elevated.  Update:, Second troponin declining, and in context of his recent catheterization both of these values are appropriately decreasing from that procedure.  Per cardiology patient is cleared from their perspective.  With ongoing nausea, CT scan will be ordered.  1:28 PM Reviewed the CT scan, discussed this and all lab results with the patient and his daughter who is now at bedside.  We generally reassuring findings, though with noted persistent omental thickening, patient is appropriate for discharge with ongoing outpatient follow-up, ongoing therapy with immunomodulatory agents.  He describes ongoing bilateral upper shoulder pain that has been present for some time, resistant to multiple different medications. We discussed options and the patient  will start short course of Lyrica, to be discussed with his physician when he sees him on Monday. No early evidence for bacteremia, sepsis, no evidence for ACS, no evidence for PE, notes for pneumonia, patient discharged in stable condition. MDM Rules/Calculators/A&P MDM Number of Diagnoses or Management Options Atypical chest pain: new, needed workup Nausea: new, needed workup   Amount and/or Complexity of Data Reviewed Clinical lab tests: ordered and reviewed Tests in the radiology section of CPT: ordered and reviewed Tests in the medicine section of CPT: reviewed and ordered Decide to obtain previous medical records or to obtain history from someone other than  the patient: yes Obtain history from someone other than the patient: yes Review and summarize past medical records: yes Discuss the patient with other providers: yes Independent visualization of images, tracings, or specimens: yes  Risk of Complications, Morbidity, and/or Mortality Presenting problems: high Diagnostic procedures: high Management options: high  Critical Care Total time providing critical care: < 30 minutes  Patient Progress Patient progress: stable   Final Clinical Impression(s) / ED Diagnoses Final diagnoses:  Atypical chest pain  Nausea    Rx / DC Orders ED Discharge Orders          Ordered    pregabalin (LYRICA) 75 MG capsule  2 times daily        12/05/20 1324    pregabalin (LYRICA) 75 MG capsule  2 times daily        12/05/20 1326             Carmin Muskrat, MD 12/05/20 1330

## 2020-12-05 NOTE — Telephone Encounter (Signed)
Received call from pt's daughter, Logan Alvarez. She is calling to ask about pain medication for her father. He recently had an MI with stent placement. He is in the ED @ [facility] for nausea and chest tightness (was discharged yesterday from hospital after his MI/Stent placement). She states he has ongoing muscular pain primarily in his shoulders, upper back.  He cannot take tylenol due to his liver cancer. His cardiologist told him he cannot take Ibuprofen now (he is on an aspirin daily)  He was given oxycodone 5 mg at the time of discharge but she does not want him to take it as it makes him sleepy and is constipating. Logan Alvarez has not tried tramadol. Daughter seems reluctant to try that as well.  The cardiologist stopped his gabapentin as well.  She is asking if he can take Claritin for his muscle pain.  Please advise.

## 2020-12-05 NOTE — Discharge Instructions (Addendum)
As discussed, your evaluation today has been largely reassuring.  But, it is important that you monitor your condition carefully, and do not hesitate to return to the ED if you develop new, or concerning changes in your condition.  Otherwise, please follow-up with your physician for appropriate ongoing care.  You have been prescribed Lyrica.  Please use the paper prescription to obtain sufficient supply for short-term to your mail order prescription has been filled.  Please discuss this medication with your physician at your visit on Monday.

## 2020-12-06 ENCOUNTER — Encounter: Payer: Self-pay | Admitting: Hematology and Oncology

## 2020-12-09 ENCOUNTER — Other Ambulatory Visit: Payer: Self-pay

## 2020-12-09 ENCOUNTER — Ambulatory Visit (INDEPENDENT_AMBULATORY_CARE_PROVIDER_SITE_OTHER): Payer: BC Managed Care – PPO | Admitting: Family Medicine

## 2020-12-09 ENCOUNTER — Encounter: Payer: Self-pay | Admitting: Family Medicine

## 2020-12-09 VITALS — BP 132/78 | HR 88 | Temp 97.9°F | Wt 209.0 lb

## 2020-12-09 DIAGNOSIS — E222 Syndrome of inappropriate secretion of antidiuretic hormone: Secondary | ICD-10-CM

## 2020-12-09 DIAGNOSIS — I10 Essential (primary) hypertension: Secondary | ICD-10-CM | POA: Diagnosis not present

## 2020-12-09 DIAGNOSIS — E038 Other specified hypothyroidism: Secondary | ICD-10-CM

## 2020-12-09 DIAGNOSIS — I25118 Atherosclerotic heart disease of native coronary artery with other forms of angina pectoris: Secondary | ICD-10-CM | POA: Diagnosis not present

## 2020-12-09 DIAGNOSIS — E559 Vitamin D deficiency, unspecified: Secondary | ICD-10-CM

## 2020-12-09 DIAGNOSIS — Z23 Encounter for immunization: Secondary | ICD-10-CM

## 2020-12-09 DIAGNOSIS — M2550 Pain in unspecified joint: Secondary | ICD-10-CM

## 2020-12-09 MED ORDER — PREGABALIN 75 MG PO CAPS: 75.0000 mg | ORAL_CAPSULE | Freq: Two times a day (BID) | ORAL | 0 refills | Status: DC

## 2020-12-09 MED ORDER — METOPROLOL TARTRATE 25 MG PO TABS: 12.5000 mg | ORAL_TABLET | Freq: Two times a day (BID) | ORAL | 5 refills | Status: DC

## 2020-12-09 MED ORDER — EZETIMIBE 10 MG PO TABS: 10.0000 mg | ORAL_TABLET | Freq: Every day | ORAL | 5 refills | Status: DC

## 2020-12-09 MED ORDER — VITAMIN D (ERGOCALCIFEROL) 1.25 MG (50000 UNIT) PO CAPS: ORAL_CAPSULE | ORAL | 2 refills | Status: DC

## 2020-12-09 MED ORDER — ATORVASTATIN CALCIUM 80 MG PO TABS: 80.0000 mg | ORAL_TABLET | Freq: Every day | ORAL | 5 refills | Status: DC

## 2020-12-09 NOTE — Progress Notes (Signed)
   Subjective:    Patient ID: Logan Alvarez, male    DOB: 08/29/1955, 65 y.o.   MRN: [id number]  HPI Pt went to [facility] on 12/02/20, admitted to [facility], had cath and then came home. Went back to ER on 12/05/20 for chest pain. Pt would like to discuss lab values (several low values). Pt is seeing cardiologist.  Patient states he is doing better now Denies any chest tightness pressure pain He is walking around his yard a little bit but no active exercise Does not smoke Does have a lot of joint pains and discomfort in his shoulders and in his back that he attributes to his chemotherapy We also went over his lab results x-rays results as well as answered multiple questions regarding heart disease chronic pain and other measures  Review of Systems     Objective:   Physical Exam  General-in no acute distress Eyes-no discharge Lungs-respiratory rate normal, CTA CV-no murmurs,RRR Extremities skin warm dry no edema Neuro grossly normal Behavior normal, alert       Assessment & Plan:  1. SIADH (syndrome of inappropriate ADH production) (HCC) We will monitor the sodium I encouraged him not to drink too much water try to limit this to where he drinks enough to have light urine 64 ounces or more of liquids daily - Basic Metabolic Panel (BMET) - TSH - T4, free - T3  2. Primary hypertension Blood pressure under good control continue current measures - Basic Metabolic Panel (BMET) - TSH - T4, free - T3  3. Subclinical hypothyroidism Check complete thyroid function.  Not at a point where levothyroxine indicated - Basic Metabolic Panel (BMET) - TSH - T4, free - T3  4. Vitamin D deficiency 50,000 unit vitamin D weekly for the next 3 months then 2000 IU daily  5. Need for vaccination Pneumococcal today - Pneumococcal conjugate vaccine 20-valent (Prevnar 20)  6. Coronary artery disease of native heart with stable angina pectoris, unspecified vessel or lesion type [facility]) Follow  through cardiology would be a good candidate for cardiac rehab currently stable  7. Arthralgia, unspecified joint Joint pains and discomforts severe enough.  Because of his medications cannot take anti-inflammatories.  We will try Lyrica 75 mg twice daily if this does well enough to stick with this if not consider narcotics but no dosing of narcotics because of his already use of Xanax I encouraged him in regards of Xanax only use when necessary  He will do lab work or follow-up on that  [place] patient 4 to 6 weeks  We will also send notification to oncology regarding our management of his pain  Patient also uncertain if he needs Port-A-Cath because of all the times he has to get blood drawn currently right now we will hold off on this and leave this to oncology to discuss

## 2020-12-10 ENCOUNTER — Telehealth: Payer: Self-pay

## 2020-12-10 DIAGNOSIS — E222 Syndrome of inappropriate secretion of antidiuretic hormone: Secondary | ICD-10-CM | POA: Diagnosis not present

## 2020-12-10 DIAGNOSIS — E038 Other specified hypothyroidism: Secondary | ICD-10-CM | POA: Diagnosis not present

## 2020-12-10 DIAGNOSIS — I10 Essential (primary) hypertension: Secondary | ICD-10-CM | POA: Diagnosis not present

## 2020-12-10 NOTE — Telephone Encounter (Signed)
Patient notified of completion of [employer] Disability Form. Fax Transmission Confirmation received and copy of form placed at  Registration desk for pick-up as requested. Request for medical records forwarded to [place] Information Management with signed Release of Information Form.

## 2020-12-11 ENCOUNTER — Encounter: Payer: Self-pay | Admitting: Hematology and Oncology

## 2020-12-11 LAB — BASIC METABOLIC PANEL
BUN/Creatinine Ratio: 14 (ref 10–24)
BUN: 9 mg/dL (ref 8–27)
CO2: 25 mmol/L (ref 20–29)
Calcium: 9.9 mg/dL (ref 8.6–10.2)
Chloride: 96 mmol/L (ref 96–106)
Creatinine, Ser: 0.65 mg/dL — ABNORMAL LOW (ref 0.76–1.27)
Glucose: 144 mg/dL — ABNORMAL HIGH (ref 70–99)
Potassium: 4.8 mmol/L (ref 3.5–5.2)
Sodium: 134 mmol/L (ref 134–144)
eGFR: 104 mL/min/{1.73_m2} (ref >59)

## 2020-12-11 LAB — T4, FREE: Free T4: 1.39 ng/dL (ref 0.82–1.77)

## 2020-12-11 LAB — TSH: TSH: 4.16 u[IU]/mL (ref 0.450–4.500)

## 2020-12-11 LAB — T3: T3, Total: 85 ng/dL (ref 71–180)

## 2020-12-13 ENCOUNTER — Other Ambulatory Visit: Payer: Self-pay

## 2020-12-13 ENCOUNTER — Ambulatory Visit (INDEPENDENT_AMBULATORY_CARE_PROVIDER_SITE_OTHER): Payer: BC Managed Care – PPO | Admitting: Student

## 2020-12-13 ENCOUNTER — Encounter: Payer: Self-pay | Admitting: Student

## 2020-12-13 VITALS — BP 112/70 | HR 89 | Ht 68.0 in | Wt 210.2 lb

## 2020-12-13 DIAGNOSIS — E785 Hyperlipidemia, unspecified: Secondary | ICD-10-CM | POA: Diagnosis not present

## 2020-12-13 DIAGNOSIS — I2510 Atherosclerotic heart disease of native coronary artery without angina pectoris: Secondary | ICD-10-CM | POA: Diagnosis not present

## 2020-12-13 DIAGNOSIS — I10 Essential (primary) hypertension: Secondary | ICD-10-CM

## 2020-12-13 DIAGNOSIS — C220 Liver cell carcinoma: Secondary | ICD-10-CM

## 2020-12-13 MED ORDER — LOSARTAN POTASSIUM 50 MG PO TABS: 25.0000 mg | ORAL_TABLET | Freq: Every day | ORAL | 1 refills | Status: DC

## 2020-12-13 MED ORDER — TICAGRELOR 90 MG PO TABS: 90.0000 mg | ORAL_TABLET | Freq: Two times a day (BID) | ORAL | 11 refills | Status: DC

## 2020-12-13 MED ORDER — NITROGLYCERIN 0.4 MG SL SUBL: 0.4000 mg | SUBLINGUAL_TABLET | SUBLINGUAL | 2 refills | Status: AC | PRN

## 2020-12-13 NOTE — Patient Instructions (Signed)
Medication Instructions:   Decrease Losartan to 25 mg Daily   *If you need a refill on your cardiac medications before your next appointment, please call your pharmacy*   Lab Work: NONE   If you have labs (blood work) drawn today and your tests are completely normal, you will receive your results only by: [place] (if you have MyChart) OR A paper copy in the mail If you have any lab test that is abnormal or we need to change your treatment, we will call you to review the results.   Testing/Procedures: NONE    Follow-Up: At [facility], you and your health needs are our priority.  As part of our continuing mission to provide you with exceptional heart care, we have created designated Provider Care Teams.  These Care Teams include your primary Cardiologist (physician) and Advanced Practice Providers (APPs -  Physician Assistants and Nurse Practitioners) who all work together to provide you with the care you need, when you need it.  We recommend signing up for the patient portal called "MyChart".  Sign up information is provided on this After Visit Summary.  MyChart is used to connect with patients for Virtual Visits (Telemedicine).  Patients are able to view lab/test results, encounter notes, upcoming appointments, etc.  Non-urgent messages can be sent to your provider as well.   To learn more about what you can do with MyChart, go to [email or web address].    Your next appointment:   3 month(s)  The format for your next appointment:   In Person  Provider:   Jenkins Rouge, MD    Other Instructions Thank you for choosing [facility]!

## 2020-12-13 NOTE — Progress Notes (Signed)
Cardiology Office Note    Date:  12/14/2020   ID:  DIANTE BARLEY, DOB February 11, 1955, MRN [id number]  PCP:  Kathyrn Drown, MD  Cardiologist: Jenkins Rouge, MD    Chief Complaint  Patient presents with   Hospitalization Follow-up    History of Present Illness:    Logan Alvarez is a 65 y.o. male with past medical history of CAD (nonobstructive CAD by cath in 2014 and medical management recommended), HTN, HLD and Stage 4 hepatocellular carcinoma (on Immunotherapy) who presents to the office today for hospital follow-up.   He presented to [facility] ED on 12/02/2020 for evaluation of episodes of chest pain over the past 2 weeks and was found to have an NSTEMI with Hs Troponin peaking at 3068. Echo showed a preserved EF of 55-60% with no regional WMA and no significant valve abnormalities. After reviewing with Oncology, he was transferred to [facility] for a catheterization which showed 80% mid-LCx stenosis and 90% distal LCx stenosis. He did undergo DES placement to the mid-LCx with medical management of his distal disease recommended given the small vessel size. Was recommended to be on ASA and Brilinta for 1 month then could change Brilinta to Plavix after 1 month but the rounding team recommended to continue ASA and Brilinta. Zetia was added to his medication regimen as well and Losartan was titrated to 50mg  daily given elevated BP.  He did present back to the ED the day after hospital discharge due to persistent nausea and intermittent chest pain. Repeat Hs Troponin had trended down to 340 and EKG was without acute ST changes. Cardiology consulted on the patient and cleared him for discharge home and no changes were made to his medication regimen.   In talking with the patient and his daughter today, he reports still feeling weak since his recent admission No recurrent chest pain or worsening dyspnea. No reported orthopnea, PND or pitting edema. He has been tolerating Brilinta well and denies  any evidence of active bleeding. He did have significant bruising and mild swelling along his cath site following his procedure but says this has improved since hospital discharge.    Past Medical History:  Diagnosis Date   CAD (coronary artery disease)    a. 01/2013: cath showing nonobstructive disease. b. 11/2020: NSTEMI with DES to mid-LCx and medical management recommended of distal LCx disease given small vessel size   Hepatocellular carcinoma ([place])    Stage IV   Hyperlipidemia    Hypertension    Testosterone deficiency 2009    Past Surgical History:  Procedure Laterality Date   BALLOON DILATION  07/30/2020   Procedure: BALLOON DILATION;  Surgeon: Eloise Harman, DO;  [facility];  Service: Endoscopy;;   COLONOSCOPY N/A 03/12/2017   Procedure: COLONOSCOPY;  Surgeon: Danie Binder, MD;  [facility];  Service: Endoscopy;  Laterality: N/A;  2:00   COLONOSCOPY WITH PROPOFOL N/A 06/18/2020   Procedure: COLONOSCOPY WITH PROPOFOL;  Surgeon: Eloise Harman, DO;  [facility];  Service: Endoscopy;  Laterality: N/A;  pm appt   CORONARY STENT INTERVENTION N/A 12/03/2020   Procedure: CORONARY STENT INTERVENTION;  Surgeon: Jettie Booze, MD;  Location: [place] CV LAB;  Service: Cardiovascular;  Laterality: N/A;   ESOPHAGOGASTRODUODENOSCOPY (EGD) WITH PROPOFOL N/A 07/30/2020   Procedure: ESOPHAGOGASTRODUODENOSCOPY (EGD) WITH PROPOFOL;  Surgeon: Eloise Harman, DO;  [facility];  Service: Endoscopy;  Laterality: N/A;  11:45am   LEFT HEART  CATH AND CORONARY ANGIOGRAPHY N/A 12/03/2020   Procedure: LEFT HEART CATH AND CORONARY ANGIOGRAPHY;  Surgeon: Jettie Booze, MD;  Location: [facility];  Service: Cardiovascular;  Laterality: N/A;   LEFT HEART CATHETERIZATION WITH CORONARY ANGIOGRAM N/A 02/01/2013   Procedure: LEFT HEART CATHETERIZATION WITH CORONARY ANGIOGRAM;  Surgeon: Josue Hector, MD;  Location: [facility] CATH LAB;   Service: Cardiovascular;  Laterality: N/A;   NECK SURGERY     POLYPECTOMY  06/18/2020   Procedure: POLYPECTOMY;  Surgeon: Eloise Harman, DO;  [facility];  Service: Endoscopy;;  snare and biospy    Current Medications: Outpatient Medications Prior to Visit  Medication Sig Dispense Refill   ALPRAZolam (XANAX) 0.5 MG tablet 1/2 tablet to 1 tablet up to 3 times a day as needed for anxiety 90 tablet 5   aspirin EC 81 MG tablet Take 1 tablet (81 mg total) by mouth daily. Swallow whole. 30 tablet 11   atorvastatin (LIPITOR) 80 MG tablet Take 1 tablet (80 mg total) by mouth daily at 6 (six) AM. 30 tablet 5   dextromethorphan-guaiFENesin (ROBITUSSIN-DM) 10-100 MG/5ML liquid Take by mouth.     ezetimibe (ZETIA) 10 MG tablet Take 1 tablet (10 mg total) by mouth daily. 30 tablet 5   fluticasone (FLONASE) 50 MCG/ACT nasal spray Place into the nose.     metoprolol tartrate (LOPRESSOR) 25 MG tablet Take 0.5 tablets (12.5 mg total) by mouth 2 (two) times daily. 30 tablet 5   pregabalin (LYRICA) 75 MG capsule Take 1 capsule (75 mg total) by mouth 2 (two) times daily. 60 capsule 0   pregabalin (LYRICA) 75 MG capsule Take 1 capsule (75 mg total) by mouth 2 (two) times daily. 28 capsule 0   sodium chloride (OCEAN) 0.65 % nasal spray 1 spray as needed for Congestion     Vitamin D, Ergocalciferol, (DRISDOL) 1.25 MG (50000 UNIT) CAPS capsule Take one capsule po weekly 4 capsule 2   losartan (COZAAR) 50 MG tablet Take 1 tablet (50 mg total) by mouth daily. 90 tablet 1   ticagrelor (BRILINTA) 90 MG TABS tablet Take 1 tablet (90 mg total) by mouth 2 (two) times daily. 60 tablet 1   No facility-administered medications prior to visit.     Allergies:   Cymbalta [duloxetine hcl], Lodine [etodolac], and Zoloft [sertraline hcl]   Social History   Socioeconomic History   Marital status: Widowed    Spouse name: Not on file   Number of children: Not on file   Years of education: Not on file    Highest education level: Not on file  Occupational History   Not on file  Tobacco Use   Smoking status: Never   Smokeless tobacco: Never  Vaping Use   Vaping Use: Never used  Substance and Sexual Activity   Alcohol use: Not Currently    Comment: 4-6 beers a day   Drug use: No   Sexual activity: Not on file  Other Topics Concern   Not on file  Social History Narrative   Not on file   Social Determinants of Health   Financial Resource Strain: Not on file  Food Insecurity: Not on file  Transportation Needs: Not on file  Physical Activity: Not on file  Stress: Not on file  Social Connections: Not on file     Family History:  The patient's family history includes Heart attack in his father.   Review of Systems:    Please see the history of  present illness.     All other systems reviewed and are otherwise negative except as noted above.   Physical Exam:    VS:  BP 112/70   Pulse 89   Ht 5\' 8"  (1.727 m)   Wt 210 lb 3.2 oz (95.3 kg)   SpO2 98%   BMI 31.96 kg/m    General: Well developed, well nourished,male appearing in no acute distress. Head: Normocephalic, atraumatic. Neck: No carotid bruits. JVD not elevated.  Lungs: Respirations regular and unlabored, without wheezes or rales.  Heart: Regular rate and rhythm. No S3 or S4.  No murmur, no rubs, or gallops appreciated. Abdomen: Appears non-distended. No obvious abdominal masses. Msk:  Strength and tone appear normal for age. No obvious joint deformities or effusions. Extremities: No clubbing or cyanosis. No pitting edema. Radial cath site with small firm area most consistent with a small hematoma.  Neuro: Alert and oriented X 3. Moves all extremities spontaneously. No focal deficits noted. Psych:  Responds to questions appropriately with a normal affect. Skin: No rashes or lesions noted  Wt Readings from Last 3 Encounters:  12/13/20 210 lb 3.2 oz (95.3 kg)  12/09/20 209 lb (94.8 kg)  12/05/20 215 lb (97.5 kg)     Studies/Labs Reviewed:   EKG:  EKG is not ordered today. EKG from 12/05/2020 is reviewed and shows NSR, HR 90 with no acute ST changes.   Recent Labs: 12/05/2020: ALT 19; B Natriuretic Peptide 36.3; Hemoglobin 14.0; Magnesium 1.8; Platelets 308 12/10/2020: BUN 9; Creatinine, Ser 0.65; Potassium 4.8; Sodium 134; TSH 4.160   Lipid Panel    Component Value Date/Time   CHOL 158 12/03/2020 0433   CHOL 168 05/16/2020 0820   TRIG 116 12/03/2020 0433   HDL 36 (L) 12/03/2020 0433   HDL 47 05/16/2020 0820   CHOLHDL 4.4 12/03/2020 0433   VLDL 23 12/03/2020 0433   LDLCALC 99 12/03/2020 0433   LDLCALC 102 (H) 05/16/2020 0820    Additional studies/ records that were reviewed today include:   Echocardiogram: 12/2020 IMPRESSIONS     1. Left ventricular ejection fraction, by estimation, is 55 to 60%. The  left ventricle has normal function. The left ventricle has no regional  wall motion abnormalities. Left ventricular diastolic parameters were  normal.   2. Right ventricular systolic function is normal. The right ventricular  size is normal. Tricuspid regurgitation signal is inadequate for assessing  PA pressure.   3. The mitral valve is grossly normal. Trivial mitral valve  regurgitation.   4. The aortic valve is grossly normal. Aortic valve regurgitation is not  visualized. No aortic stenosis is present. Aortic valve mean gradient  measures 4.0 mmHg.   5. The inferior vena cava is normal in size with greater than 50%  respiratory variability, suggesting right atrial pressure of 3 mmHg.   Comparison(s): No prior Echocardiogram.    LHC: 12/2020 Mid LAD lesion is 25% stenosed.   Mid Cx lesion is 80% stenosed.   A drug-eluting stent was successfully placed using a STENT ONYX FRONTIER 3.5X22, postdilated to 4.0 mm.   Post intervention, there is a 0% residual stenosis.   Dist Cx lesion is 90% stenosed.  This distal vessel was small.  Plan for medical treatment.   The left ventricular  systolic function is normal.   LV end diastolic pressure is mildly elevated.   The left ventricular ejection fraction is 55-65% by visual estimate.   There is no aortic valve stenosis.   NSTEMI likely due  to circumflex disease.  Distal vessel with severe lesion but the vessel is small in this distribution.  Large mid circumflex was successfully treated.  Continue aggressive secondary prevention.  Plan for aspirin and Brilinta for 1 month.  Given his other comorbidities, would change Brilinta to clopidogrel after a month.  Would like for him to at a minimum complete 3-6 months of dual antiplatelet therapy total.  Ideally, he would complete a longer duration.  If there were bleeding issues, could adjust before a year.   Results conveyed to his daughter Vito Backers [phone]  Assessment:    1. Coronary artery disease involving native coronary artery of native heart without angina pectoris   2. Essential hypertension   3. Hyperlipidemia LDL goal <70   4. Hepatocellular carcinoma ([place])      Plan:   In order of problems listed above:  1. CAD - Recently admitted with an NSTEMI in 11/2020 and underwent DES placement to the mid-LCx with medical management recommended for his distal LCx disease.  - He continues to have fatigue but denies any recurrent anginal symptoms. Was recommended by the Cardiology rounding team during admission to continue ASA and Brilinta if tolerating well and he has not experienced any side effects. I encouraged him to reach out if he notices any evidence of bleeding or if Hgb or platelets start to decline, as Brilinta could be transitioned to Plavix as discussed in his cath report. Continue Atorvastatin, Zetia and Lopressor. Will reduce Losartan dosing as outlined above.  - He does have what appears most consistent with a small, firm hematoma along his radial cath site which has improved since hospital discharge. No evidence of a pseudoaneurysm on exam. I encouraged him not  to lift more than 10 lbs with his right arm and to make [place] aware if this increases in size or does not resolve within the next few weeks.   2. HTN - His BP is at 112/70 during today's visit but he reports this has been lower at home and he reports associated dizziness with positional changes. Will reduce Losartan from 50mg  daily to 25mg  daily. Continue Lopressor 12.5mg  BID.   3. HLD - His LDL was at 99 in 12/2020 and he was switched from Pravastatin to Atorvastatin during admission with Zetia being added as well. He does have repeat labs scheduled with Oncology in the coming weeks and would keep a close check on LFT's given his known hepatocellular carcinoma.   4. Stage 4 Hepatocellular Carcinoma - Underwent surgery in 2019 with recurrent metastatic disease diagnosed earlier this year. Currently on Immunotherapy.   Medication Adjustments/Labs and Tests Ordered: Current medicines are reviewed at length with the patient today.  Concerns regarding medicines are outlined above.  Medication changes, Labs and Tests ordered today are listed in the Patient Instructions below. Patient Instructions  Medication Instructions:   Decrease Losartan to 25 mg Daily   *If you need a refill on your cardiac medications before your next appointment, please call your pharmacy*   Lab Work: NONE   If you have labs (blood work) drawn today and your tests are completely normal, you will receive your results only by: [place] (if you have MyChart) OR A paper copy in the mail If you have any lab test that is abnormal or we need to change your treatment, we will call you to review the results.   Testing/Procedures: NONE    Follow-Up: At [facility], you and your health needs are our priority.  As  part of our continuing mission to provide you with exceptional heart care, we have created designated Provider Care Teams.  These Care Teams include your primary Cardiologist (physician) and Advanced  Practice Providers (APPs -  Physician Assistants and Nurse Practitioners) who all work together to provide you with the care you need, when you need it.  We recommend signing up for the patient portal called "MyChart".  Sign up information is provided on this After Visit Summary.  MyChart is used to connect with patients for Virtual Visits (Telemedicine).  Patients are able to view lab/test results, encounter notes, upcoming appointments, etc.  Non-urgent messages can be sent to your provider as well.   To learn more about what you can do with MyChart, go to [email or web address].    Your next appointment:   3 month(s)  The format for your next appointment:   In Person  Provider:   Jenkins Rouge, MD    Other Instructions Thank you for choosing [facility]!     Signed, Erma Heritage, PA-C  12/14/2020 10:29 AM    [place] [address] [place], [place] [address] Phone: [phone] Fax: [phone]

## 2020-12-14 ENCOUNTER — Encounter: Payer: Self-pay | Admitting: Student

## 2020-12-16 ENCOUNTER — Other Ambulatory Visit: Payer: Self-pay

## 2020-12-16 ENCOUNTER — Inpatient Hospital Stay: Payer: BC Managed Care – PPO

## 2020-12-16 ENCOUNTER — Inpatient Hospital Stay: Payer: BC Managed Care – PPO | Attending: Hematology and Oncology

## 2020-12-16 ENCOUNTER — Inpatient Hospital Stay (HOSPITAL_BASED_OUTPATIENT_CLINIC_OR_DEPARTMENT_OTHER): Payer: BC Managed Care – PPO | Admitting: Hematology and Oncology

## 2020-12-16 VITALS — BP 144/85 | HR 90 | Temp 97.7°F | Resp 18 | Wt 208.3 lb

## 2020-12-16 DIAGNOSIS — C220 Liver cell carcinoma: Secondary | ICD-10-CM

## 2020-12-16 DIAGNOSIS — C786 Secondary malignant neoplasm of retroperitoneum and peritoneum: Secondary | ICD-10-CM | POA: Diagnosis not present

## 2020-12-16 DIAGNOSIS — Z5111 Encounter for antineoplastic chemotherapy: Secondary | ICD-10-CM | POA: Insufficient documentation

## 2020-12-16 DIAGNOSIS — R978 Other abnormal tumor markers: Secondary | ICD-10-CM | POA: Diagnosis not present

## 2020-12-16 DIAGNOSIS — R7402 Elevation of levels of lactic acid dehydrogenase (LDH): Secondary | ICD-10-CM | POA: Insufficient documentation

## 2020-12-16 DIAGNOSIS — Z5112 Encounter for antineoplastic immunotherapy: Secondary | ICD-10-CM | POA: Insufficient documentation

## 2020-12-16 LAB — CMP (CANCER CENTER ONLY)
ALT: 44 U/L (ref 0–44)
AST: 50 U/L — ABNORMAL HIGH (ref 15–41)
Albumin: 3.2 g/dL — ABNORMAL LOW (ref 3.5–5.0)
Alkaline Phosphatase: 137 U/L — ABNORMAL HIGH (ref 38–126)
Anion gap: 11 (ref 5–15)
BUN: 8 mg/dL (ref 8–23)
CO2: 22 mmol/L (ref 22–32)
Calcium: 9.5 mg/dL (ref 8.9–10.3)
Chloride: 99 mmol/L (ref 98–111)
Creatinine: 0.74 mg/dL (ref 0.61–1.24)
GFR, Estimated: >60 mL/min (ref >60)
Glucose, Bld: 187 mg/dL — ABNORMAL HIGH (ref 70–99)
Potassium: 4.1 mmol/L (ref 3.5–5.1)
Sodium: 132 mmol/L — ABNORMAL LOW (ref 135–145)
Total Bilirubin: 1.3 mg/dL — ABNORMAL HIGH (ref 0.3–1.2)
Total Protein: 7.7 g/dL (ref 6.5–8.1)

## 2020-12-16 LAB — CBC WITH DIFFERENTIAL (CANCER CENTER ONLY)
Abs Immature Granulocytes: 0.05 10*3/uL (ref 0.00–0.07)
Basophils Absolute: 0.1 10*3/uL (ref 0.0–0.1)
Basophils Relative: 1 %
Eosinophils Absolute: 0.3 10*3/uL (ref 0.0–0.5)
Eosinophils Relative: 3 %
HCT: 37.2 % — ABNORMAL LOW (ref 39.0–52.0)
Hemoglobin: 12.8 g/dL — ABNORMAL LOW (ref 13.0–17.0)
Immature Granulocytes: 1 %
Lymphocytes Relative: 7 %
Lymphs Abs: 0.7 10*3/uL (ref 0.7–4.0)
MCH: 29 pg (ref 26.0–34.0)
MCHC: 34.4 g/dL (ref 30.0–36.0)
MCV: 84.2 fL (ref 80.0–100.0)
Monocytes Absolute: 1 10*3/uL (ref 0.1–1.0)
Monocytes Relative: 10 %
Neutro Abs: 7.5 10*3/uL (ref 1.7–7.7)
Neutrophils Relative %: 78 %
Platelet Count: 381 10*3/uL (ref 150–400)
RBC: 4.42 MIL/uL (ref 4.22–5.81)
RDW: 13.2 % (ref 11.5–15.5)
WBC Count: 9.6 10*3/uL (ref 4.0–10.5)
nRBC: 0 % (ref 0.0–0.2)

## 2020-12-16 LAB — CEA (IN HOUSE-CHCC): CEA (CHCC-In House): 42.67 ng/mL — ABNORMAL HIGH (ref 0.00–5.00)

## 2020-12-16 LAB — PROTEIN / CREATININE RATIO, URINE
Creatinine, Urine: 62.83 mg/dL
Protein Creatinine Ratio: 0.16 mg/mg{Cre} — ABNORMAL HIGH (ref 0.00–0.15)
Total Protein, Urine: 10 mg/dL

## 2020-12-16 LAB — LACTATE DEHYDROGENASE: LDH: 186 U/L (ref 98–192)

## 2020-12-16 MED ORDER — SODIUM CHLORIDE 0.9 % IV SOLN
Freq: Once | INTRAVENOUS | Status: AC
Start: 2020-12-16 — End: 2020-12-16

## 2020-12-16 MED ORDER — SODIUM CHLORIDE 0.9 % IV SOLN
1200.0000 mg | Freq: Once | INTRAVENOUS | Status: AC
  Administered 2020-12-16: 1200 mg via INTRAVENOUS
  Filled 2020-12-16: qty 20

## 2020-12-16 MED ORDER — SODIUM CHLORIDE 0.9 % IV SOLN
15.0000 mg/kg | Freq: Once | INTRAVENOUS | Status: AC
  Administered 2020-12-16: 1500 mg via INTRAVENOUS
  Filled 2020-12-16: qty 12

## 2020-12-16 NOTE — Progress Notes (Signed)
Ok to proceed with Bevacizumab - stent placed 12/03/20 Dr Lorenso Courier confirmed enough time has elapsed and ok for treatment today.  T.O. Dr Genia Hotter, PharmD

## 2020-12-16 NOTE — Patient Instructions (Signed)
[facility]  Discharge Instructions: Thank you for choosing [facility] to provide your oncology and hematology care.   If you have a lab appointment with the [facility], please go directly to the [facility] and check in at the registration area.   Wear comfortable clothing and clothing appropriate for easy access to any Portacath or PICC line.   We strive to give you quality time with your provider. You may need to reschedule your appointment if you arrive late (15 or more minutes).  Arriving late affects you and other patients whose appointments are after yours.  Also, if you miss three or more appointments without notifying the office, you may be dismissed from the clinic at the provider's discretion.      For prescription refill requests, have your pharmacy contact our office and allow 72 hours for refills to be completed.    Today you received the following chemotherapy and/or immunotherapy agents M      To help prevent nausea and vomiting after your treatment, we encourage you to take your nausea medication as directed.  BELOW ARE SYMPTOMS THAT SHOULD BE REPORTED IMMEDIATELY: *FEVER GREATER THAN 100.4 F (38 C) OR HIGHER *CHILLS OR SWEATING *NAUSEA AND VOMITING THAT IS NOT CONTROLLED WITH YOUR NAUSEA MEDICATION *UNUSUAL SHORTNESS OF BREATH *UNUSUAL BRUISING OR BLEEDING *URINARY PROBLEMS (pain or burning when urinating, or frequent urination) *BOWEL PROBLEMS (unusual diarrhea, constipation, pain near the anus) TENDERNESS IN MOUTH AND THROAT WITH OR WITHOUT PRESENCE OF ULCERS (sore throat, sores in mouth, or a toothache) UNUSUAL RASH, SWELLING OR PAIN  UNUSUAL VAGINAL DISCHARGE OR ITCHING   Items with * indicate a potential emergency and should be followed up as soon as possible or go to the Emergency Department if any problems should occur.  Please show the CHEMOTHERAPY ALERT CARD or IMMUNOTHERAPY ALERT CARD at check-in to the  Emergency Department and triage nurse.  Should you have questions after your visit or need to cancel or reschedule your appointment, please contact [facility]  Dept: [phone]  and follow the prompts.  Office hours are 8:00 a.m. to 4:30 p.m. Monday - Friday. Please note that voicemails left after 4:00 p.m. may not be returned until the following business day.  We are closed weekends and major holidays. You have access to a nurse at all times for urgent questions. Please call the main number to the clinic Dept: [phone] and follow the prompts.   For any non-urgent questions, you may also contact your provider using MyChart. We now offer e-Visits for anyone 66 and older to request care online for non-urgent symptoms. For details visit [email or web address].   Also download the MyChart app! Go to the app store, search "MyChart", open the app, select [facility], and log in with your MyChart username and password.  Due to Covid, a mask is required upon entering the hospital/clinic. If you do not have a mask, one will be given to you upon arrival. For doctor visits, patients may have 1 support person aged 25 or older with them. For treatment visits, patients cannot have anyone with them due to current Covid guidelines and our immunocompromised population.

## 2020-12-16 NOTE — Progress Notes (Signed)
Per. Dr. Lorenso Courier okay to proceed with treatment without U/A for protein. LTT RN

## 2020-12-16 NOTE — Progress Notes (Signed)
[place] [phone]   [phone]  PROGRESS NOTE  Patient Care Team: Kathyrn Drown, MD as PCP - General (Family Medicine) Josue Hector, MD as PCP - Cardiology (Cardiology) Eloise Harman, DO as Consulting Physician (Internal Medicine) Orson Slick, MD as Consulting Physician (Hematology and Oncology)  Hematological/Oncological History # Hepatocellular Carcinoma, Stage IV 10/19/17: abdomen u/s 1.7 x 1.4 x 2.0 cm solid-appearing hepatic nodule with echogenic rim consistent suggesting a calcified rim 11/08/17: liver needle core biopsy. Hepatocellular carcinoma, poorly differentiated in a background of necrosis. Reviewed at [facility], HepPar1, [place] 3 12-08-2017: robotic right partial hepatectomy. Path: T1b HCC 06/25/2020: CT abdomen shows nodular studding of the ventral mesenteric fat, findings consistent with peritoneal involvement/carcinomatosis. 06/25/2020: US guided biopsy consistent with poorly differentiated Hepatocelluar carcinoma. CA 19-9 5238, CEA 43.3 07/16/2020: clinic visit with Dr. Leamon Arnt at [facility]. Referred to [facility] for care closer to home 07/29/2020: establish care with Dr. Lorenso Courier 08/15/2020: Cycle 1 Day 1 of Atezolizumab/Bevacizumab 09/04/2020: Cycle 2 Day 1 of Atezolizumab/Bevacizumab 09/23/2020: Cycle 3 Day 1 of Atezolizumab/Bevacizumab 10/15/2020: Cycle 4 Day 1 of Atezolizumab/Bevacizumab 11/04/2020: Cycle 5 Day 1 of Atezolizumab/Bevacizumab 11/25/2020: Cycle 6 Day 1 of Atezolizumab/Bevacizumab 12/16/2020: Cycle 7 Day 1 of Atezolizumab/Bevacizumab  Interval History:  Logan Alvarez 65 y.o. male with medical history significant for stage IV HCC who presents for a follow up visit. The patient's last visit was on 11/25/2020 at which time he started Cycle 6 of atezo/bev. In the interim since the last visit he unfortunately was found to have an MI.  During that process he underwent a CT abdomen on 12/05/2020  due to nausea/vomiting. Findings showed no evidence of disease progression.   On exam today Logan Alvarez is accompanied by his daughter.  Today he reports that his emergency department physician transitioned him from gabapentin over to Lyrica.  He notes that Lyrica is helping some but he continues to have that "inflammatory pain" in his shoulders.  Reports the pain is about 4 out of 10 in severity and is "always there".  He reports that he does occasionally take oxycodone as breakthrough for this pain.  He otherwise has been quite well.  He has not had any further episodes of chest pain.  He notes he does have rare nosebleeding but reasonable humidifier in his bedroom which is helped with this.  He otherwise denies any fevers, chills, sweats, nausea, vomiting or diarrhea.  Denies any bleeding or bruising.  A full 10 point ROS is listed below.  Of note he is now on permanent disability and will no longer be coming to [facility] for work.  As such he is requesting transfer to [facility] to receive further chemotherapy care.  I will reach out to Dr. Delton Coombes to see if he would be willing to take on this patient.  MEDICAL HISTORY:  Past Medical History:  Diagnosis Date   CAD (coronary artery disease)    a. 01/2013: cath showing nonobstructive disease. b. 11/2020: NSTEMI with DES to mid-LCx and medical management recommended of distal LCx disease given small vessel size   Hepatocellular carcinoma ([place])    Stage IV   Hyperlipidemia    Hypertension    Testosterone deficiency 2009    SURGICAL HISTORY: Past Surgical History:  Procedure Laterality Date   BALLOON DILATION  07/30/2020   Procedure: BALLOON DILATION;  Surgeon: Eloise Harman, DO;  [facility];  Service: Endoscopy;;  COLONOSCOPY N/A 03/12/2017   Procedure: COLONOSCOPY;  Surgeon: Danie Binder, MD;  [facility];  Service: Endoscopy;  Laterality: N/A;  2:00   COLONOSCOPY WITH PROPOFOL N/A 06/18/2020    Procedure: COLONOSCOPY WITH PROPOFOL;  Surgeon: Eloise Harman, DO;  [facility];  Service: Endoscopy;  Laterality: N/A;  pm appt   CORONARY STENT INTERVENTION N/A 12/03/2020   Procedure: CORONARY STENT INTERVENTION;  Surgeon: Jettie Booze, MD;  Location: [place] CV LAB;  Service: Cardiovascular;  Laterality: N/A;   ESOPHAGOGASTRODUODENOSCOPY (EGD) WITH PROPOFOL N/A 07/30/2020   Procedure: ESOPHAGOGASTRODUODENOSCOPY (EGD) WITH PROPOFOL;  Surgeon: Eloise Harman, DO;  [facility];  Service: Endoscopy;  Laterality: N/A;  11:45am   LEFT HEART CATH AND CORONARY ANGIOGRAPHY N/A 12/03/2020   Procedure: LEFT HEART CATH AND CORONARY ANGIOGRAPHY;  Surgeon: Jettie Booze, MD;  Location: [place] CV LAB;  Service: Cardiovascular;  Laterality: N/A;   LEFT HEART CATHETERIZATION WITH CORONARY ANGIOGRAM N/A 02/01/2013   Procedure: LEFT HEART CATHETERIZATION WITH CORONARY ANGIOGRAM;  Surgeon: Josue Hector, MD;  Location: [facility] CATH LAB;  Service: Cardiovascular;  Laterality: N/A;   NECK SURGERY     POLYPECTOMY  06/18/2020   Procedure: POLYPECTOMY;  Surgeon: Eloise Harman, DO;  [facility];  Service: Endoscopy;;  snare and biospy    SOCIAL HISTORY: Social History   Socioeconomic History   Marital status: Widowed    Spouse name: Not on file   Number of children: Not on file   Years of education: Not on file   Highest education level: Not on file  Occupational History   Not on file  Tobacco Use   Smoking status: Never   Smokeless tobacco: Never  Vaping Use   Vaping Use: Never used  Substance and Sexual Activity   Alcohol use: Not Currently    Comment: 4-6 beers a day   Drug use: No   Sexual activity: Not on file  Other Topics Concern   Not on file  Social History Narrative   Not on file   Social Determinants of Health   Financial Resource Strain: Not on file  Food Insecurity: Not on file  Transportation Needs: Not on file   Physical Activity: Not on file  Stress: Not on file  Social Connections: Not on file  Intimate Partner Violence: Not on file    FAMILY HISTORY: Family History  Problem Relation Age of Onset   Heart attack Father        22's    ALLERGIES:  is allergic to cymbalta [duloxetine hcl], lodine [etodolac], and zoloft [sertraline hcl].  MEDICATIONS:  Current Outpatient Medications  Medication Sig Dispense Refill   ALPRAZolam (XANAX) 0.5 MG tablet 1/2 tablet to 1 tablet up to 3 times a day as needed for anxiety 90 tablet 5   aspirin EC 81 MG tablet Take 1 tablet (81 mg total) by mouth daily. Swallow whole. 30 tablet 11   atorvastatin (LIPITOR) 80 MG tablet Take 1 tablet (80 mg total) by mouth daily at 6 (six) AM. 30 tablet 5   dextromethorphan-guaiFENesin (ROBITUSSIN-DM) 10-100 MG/5ML liquid Take by mouth.     ezetimibe (ZETIA) 10 MG tablet Take 1 tablet (10 mg total) by mouth daily. 30 tablet 5   fluticasone (FLONASE) 50 MCG/ACT nasal spray Place into the nose.     losartan (COZAAR) 50 MG tablet Take 0.5 tablets (25 mg total) by mouth daily. 90 tablet 1   metoprolol tartrate (  LOPRESSOR) 25 MG tablet Take 0.5 tablets (12.5 mg total) by mouth 2 (two) times daily. 30 tablet 5   nitroGLYCERIN (NITROSTAT) 0.4 MG SL tablet Place 1 tablet (0.4 mg total) under the tongue every 5 (five) minutes as needed for chest pain. 25 tablet 2   pregabalin (LYRICA) 75 MG capsule Take 1 capsule (75 mg total) by mouth 2 (two) times daily. 28 capsule 0   sodium chloride (OCEAN) 0.65 % nasal spray 1 spray as needed for Congestion     ticagrelor (BRILINTA) 90 MG TABS tablet Take 1 tablet (90 mg total) by mouth 2 (two) times daily. 60 tablet 11   Vitamin D, Ergocalciferol, (DRISDOL) 1.25 MG (50000 UNIT) CAPS capsule Take one capsule po weekly 4 capsule 2   pregabalin (LYRICA) 75 MG capsule Take 1 capsule (75 mg total) by mouth 2 (two) times daily. 60 capsule 0   No current facility-administered medications for this  visit.    REVIEW OF SYSTEMS:   Constitutional: ( - ) fevers, ( - )  chills , ( - ) night sweats Eyes: ( - ) blurriness of vision, ( - ) double vision, ( - ) watery eyes Ears, nose, mouth, throat, and face: ( - ) mucositis, ( - ) sore throat Respiratory: ( - ) cough, ( - ) dyspnea, ( - ) wheezes Cardiovascular: ( - ) palpitation, ( - ) chest discomfort, ( - ) lower extremity swelling Gastrointestinal:  ( - ) nausea, ( - ) heartburn, ( - ) change in bowel habits Skin: ( - ) abnormal skin rashes Lymphatics: ( - ) new lymphadenopathy, ( - ) easy bruising Neurological: ( - ) numbness, ( - ) tingling, ( - ) new weaknesses Behavioral/Psych: ( - ) mood change, ( - ) new changes  All other systems were reviewed with the patient and are negative.  PHYSICAL EXAMINATION: ECOG PERFORMANCE STATUS: 1 - Symptomatic but completely ambulatory  Vitals:   12/16/20 0831  BP: (!) 144/85  Pulse: 90  Resp: 18  Temp: 97.7 F (36.5 C)  SpO2: 100%     Filed Weights   12/16/20 0831  Weight: 208 lb 4.8 oz (94.5 kg)    GENERAL: Well-appearing elderly Caucasian male, alert, no distress and comfortable SKIN: skin color, texture, turgor are normal, no rashes or significant lesions EYES: conjunctiva are pink and non-injected, sclera clear LUNGS: clear to auscultation and percussion with normal breathing effort HEART: regular rate & rhythm and no murmurs and no lower extremity edema Musculoskeletal: no cyanosis of digits and no clubbing  PSYCH: alert & oriented x 3, fluent speech NEURO: no focal motor/sensory deficits  LABORATORY DATA:  I have reviewed the data as listed CBC Latest Ref Rng & Units 12/16/2020 12/05/2020 12/04/2020  WBC 4.0 - 10.5 K/uL 9.6 12.5(H) 10.4  Hemoglobin 13.0 - 17.0 g/dL 12.8(L) 14.0 13.3  Hematocrit 39.0 - 52.0 % 37.2(L) 39.6 38.1(L)  Platelets 150 - 400 K/uL 381 308 296    CMP Latest Ref Rng & Units 12/16/2020 12/10/2020 12/05/2020  Glucose 70 - 99 mg/dL 187(H) 144(H) 134(H)   BUN 8 - 23 mg/dL 8 9 9   Creatinine 0.61 - 1.24 mg/dL 0.74 0.65(L) 0.79  Sodium 135 - 145 mmol/L 132(L) 134 129(L)  Potassium 3.5 - 5.1 mmol/L 4.1 4.8 4.0  Chloride 98 - 111 mmol/L 99 96 95(L)  CO2 22 - 32 mmol/L 22 25 22   Calcium 8.9 - 10.3 mg/dL 9.5 9.9 9.6  Total Protein 6.5 - 8.1 g/dL  7.7 - 7.4  Total Bilirubin 0.3 - 1.2 mg/dL 1.3(H) - 2.7(H)  Alkaline Phos 38 - 126 U/L 137(H) - 108  AST 15 - 41 U/L 50(H) - 22  ALT 0 - 44 U/L 44 - 19    RADIOGRAPHIC STUDIES: DG Chest 2 View  Result Date: 12/02/2020 CLINICAL DATA:  Chest pain EXAM: CHEST - 2 VIEW COMPARISON:  04/14/2011 FINDINGS: Heart size is normal. Mild tortuosity of the aorta. The lungs are clear. The pulmonary vascularity is normal. No pleural effusion. No pneumothorax. Right lateral inferior costophrenic angle is not included on the frontal view. Old healed rib fractures on the right at ribs 6, 7 and 8. Ordinary thoracic degenerative changes. Previous ACDF. IMPRESSION: No active disease. Old healed rib fractures on the right. Slightly tortuous aorta. Electronically Signed   By: Nelson Chimes M.D.   On: 12/02/2020 11:27   CT ABDOMEN PELVIS W CONTRAST  Result Date: 12/05/2020 CLINICAL DATA:  Nausea and vomiting. History of hepatocellular carcinoma. EXAM: CT ABDOMEN AND PELVIS WITH CONTRAST TECHNIQUE: Multidetector CT imaging of the abdomen and pelvis was performed using the standard protocol following bolus administration of intravenous contrast. CONTRAST:  181mL OMNIPAQUE IOHEXOL 300 MG/ML  SOLN COMPARISON:  10/04/2020 FINDINGS: Lower chest: Stable 3 mm subpleural nodule in the posterior left lower lobe on sequence 5 image 6, and this is unchanged since 11/18/2017. There is an additional peripheral nodule in the left lower lobe on image 3 there is stable since 2019. These nodules are compatible with benign nodules based on the stability. No pleural effusions. Hepatobiliary: Slightly decreased attenuation in the liver. Again noted are at  least 3 tiny hypodensities in liver that are unchanged. Portal venous system is patent. Gallbladder is absent. Pancreas: Again noted is low-density involving the distal pancreatic body and tail region and this is stable. No evidence for acute inflammation. No duct dilatation. Spleen: Normal in size without focal abnormality. Adrenals/Urinary Tract: Normal adrenal glands. Mild distention of the urinary bladder. Negative for hydronephrosis. Cyst in the right kidney lower pole. Stomach/Bowel: Normal appearance of the stomach and duodenum. Normal appearance of the colon. No evidence for bowel distention or obstruction. No focal bowel inflammation. Vascular/Lymphatic: Aortic atherosclerosis. No enlarged abdominal or pelvic lymph nodes. Reproductive: Prostate is unremarkable. Other: Again noted is soft tissue thickening along the anterior abdomen compatible with peritoneal disease and omental caking. Index soft tissue along the right anterior abdomen measures 1.1 cm in thickness on series 3, image 35 and previously measured 1.0 cm on 10/04/2020. Overall, the peritoneal disease has minimally changed. There is additional peritoneal disease in the anterior left upper abdomen on image 19. Stable soft tissue thickening in the right lower abdominal mesentery which could also represent neoplastic changes. Again noted is a soft tissue nodule along the lateral right upper abdomen in the deep subcutaneous tissues. This area is poorly characterized and measures 1.6 cm and difficult to evaluate for interval change due to motion artifact Musculoskeletal: Stable sclerosis involving the left iliac wing. Stable mild compression deformity involving the L1 vertebral body. No acute bone abnormality. IMPRESSION: 1. No acute abnormality in the abdomen or pelvis. 2. Persistent omental thickening with evidence of peritoneal metastatic disease. These findings have minimally changed since 10/04/2020. In addition, there is a persistent superficial  nodule along the right lateral abdomen but this area is poorly characterized due to motion. 3.  Aortic Atherosclerosis (ICD10-I70.0). Electronically Signed   By: Markus Daft M.D.   On: 12/05/2020 13:05   CARDIAC  CATHETERIZATION  Result Date: 12/03/2020   Mid LAD lesion is 25% stenosed.   Mid Cx lesion is 80% stenosed.   A drug-eluting stent was successfully placed using a STENT ONYX FRONTIER [id number], postdilated to 4.0 mm.   Post intervention, there is a 0% residual stenosis.   Dist Cx lesion is 90% stenosed.  This distal vessel was small.  Plan for medical treatment.   The left ventricular systolic function is normal.   LV end diastolic pressure is mildly elevated.   The left ventricular ejection fraction is 55-65% by visual estimate.   There is no aortic valve stenosis. NSTEMI likely due to circumflex disease.  Distal vessel with severe lesion but the vessel is small in this distribution.  Large mid circumflex was successfully treated.  Continue aggressive secondary prevention. Plan for aspirin and Brilinta for 1 month.  Given his other comorbidities, would change Brilinta to clopidogrel after a month.  Would like for him to at a minimum complete 3-6 months of dual antiplatelet therapy total.  Ideally, he would complete a longer duration.  If there were bleeding issues, could adjust before a year. Results conveyed to his daughter Vito Backers [phone]   DG Chest Portable 1 View  Result Date: 12/05/2020 CLINICAL DATA:  Chest pain. EXAM: PORTABLE CHEST 1 VIEW COMPARISON:  12/02/2020 FINDINGS: Numerous leads and wires project over the chest. Remote right rib fractures. Apical lordotic positioning. Incompletely imaged cervical spine fixation hardware. Midline trachea. Normal heart size and mediastinal contours. No pleural effusion or pneumothorax. Clear lungs. IMPRESSION: No active disease. Electronically Signed   By: Abigail Miyamoto M.D.   On: 12/05/2020 08:45   ECHOCARDIOGRAM COMPLETE  Result Date: 12/03/2020     ECHOCARDIOGRAM REPORT   Patient Name:   LAMERE LIGHTNER Date of Exam: 12/03/2020 Medical Rec #:  [id number]      Height:       68.0 in Accession #:    [id number]     Weight:       220.0 lb Date of Birth:  May 06, 1955       BSA:          2.128 m Patient Age:    22 years       BP:           125/79 mmHg Patient Gender: M              HR:           76 bpm. Exam Location:  [facility] Procedure: 2D Echo, Cardiac Doppler and Color Doppler Indications:    Acute Myocardial infarction  History:        Patient has prior history of Echocardiogram examinations, most                 recent 04/24/2011. CAD; Risk Factors:Hypertension.  Sonographer:    Wenda Low Referring Phys: [place]  1. Left ventricular ejection fraction, by estimation, is 55 to 60%. The left ventricle has normal function. The left ventricle has no regional wall motion abnormalities. Left ventricular diastolic parameters were normal.  2. Right ventricular systolic function is normal. The right ventricular size is normal. Tricuspid regurgitation signal is inadequate for assessing PA pressure.  3. The mitral valve is grossly normal. Trivial mitral valve regurgitation.  4. The aortic valve is grossly normal. Aortic valve regurgitation is not visualized. No aortic stenosis is present. Aortic valve mean gradient measures 4.0 mmHg.  5. The inferior vena cava is normal in size  with greater than 50% respiratory variability, suggesting right atrial pressure of 3 mmHg. Comparison(s): No prior Echocardiogram. FINDINGS  Left Ventricle: Left ventricular ejection fraction, by estimation, is 55 to 60%. The left ventricle has normal function. The left ventricle has no regional wall motion abnormalities. The left ventricular internal cavity size was normal in size. There is  no left ventricular hypertrophy. Left ventricular diastolic parameters were normal. Right Ventricle: The right ventricular size is normal. No increase in right ventricular wall  thickness. Right ventricular systolic function is normal. Tricuspid regurgitation signal is inadequate for assessing PA pressure. Left Atrium: Left atrial size was normal in size. Right Atrium: Right atrial size was normal in size. Pericardium: There is no evidence of pericardial effusion. Mitral Valve: The mitral valve is grossly normal. Trivial mitral valve regurgitation. MV peak gradient, 1.9 mmHg. The mean mitral valve gradient is 1.0 mmHg. Tricuspid Valve: The tricuspid valve is grossly normal. Tricuspid valve regurgitation is trivial. Aortic Valve: The aortic valve is grossly normal. There is mild aortic valve annular calcification. Aortic valve regurgitation is not visualized. No aortic stenosis is present. Aortic valve mean gradient measures 4.0 mmHg. Aortic valve peak gradient measures 6.1 mmHg. Aortic valve area, by VTI measures 2.38 cm. Pulmonic Valve: The pulmonic valve was grossly normal. Pulmonic valve regurgitation is trivial. Aorta: The aortic root is normal in size and structure. Venous: The inferior vena cava is normal in size with greater than 50% respiratory variability, suggesting right atrial pressure of 3 mmHg. IAS/Shunts: No atrial level shunt detected by color flow Doppler.  LEFT VENTRICLE PLAX 2D LVIDd:         4.70 cm     Diastology LVIDs:         3.40 cm     LV e' medial:    6.65 cm/s LV PW:         1.00 cm     LV E/e' medial:  8.4 LV IVS:        0.90 cm     LV e' lateral:   7.87 cm/s LVOT diam:     2.00 cm     LV E/e' lateral: 7.1 LV SV:         68 LV SV Index:   32 LVOT Area:     3.14 cm  LV Volumes (MOD) LV vol d, MOD A2C: 51.1 ml LV vol d, MOD A4C: 82.1 ml LV vol s, MOD A2C: 24.3 ml LV vol s, MOD A4C: 36.9 ml LV SV MOD A2C:     26.8 ml LV SV MOD A4C:     82.1 ml LV SV MOD BP:      36.3 ml RIGHT VENTRICLE RV Basal diam:  3.25 cm RV S prime:     15.90 cm/s TAPSE (M-mode): 2.8 cm LEFT ATRIUM             Index        RIGHT ATRIUM           Index LA diam:        3.90 cm 1.83 cm/m   RA  Area:     15.80 cm LA Vol (A2C):   52.7 ml 24.76 ml/m  RA Volume:   35.80 ml  16.82 ml/m LA Vol (A4C):   67.7 ml 31.81 ml/m LA Biplane Vol: 63.0 ml 29.60 ml/m  AORTIC VALVE                    PULMONIC VALVE AV Area (Vmax):  2.52 cm     PV Vmax:       0.75 m/s AV Area (Vmean):   2.33 cm     PV Peak grad:  2.3 mmHg AV Area (VTI):     2.38 cm AV Vmax:           123.00 cm/s AV Vmean:          90.400 cm/s AV VTI:            0.288 m AV Peak Grad:      6.1 mmHg AV Mean Grad:      4.0 mmHg LVOT Vmax:         98.80 cm/s LVOT Vmean:        67.000 cm/s LVOT VTI:          0.218 m LVOT/AV VTI ratio: 0.76  AORTA Ao Root diam: 3.70 cm MITRAL VALVE MV Area (PHT): 4.04 cm    SHUNTS MV Area VTI:   4.72 cm    Systemic VTI:  0.22 m MV Peak grad:  1.9 mmHg    Systemic Diam: 2.00 cm MV Mean grad:  1.0 mmHg MV Vmax:       0.69 m/s MV Vmean:      34.8 cm/s MV Decel Time: 188 msec MV E velocity: 56.10 cm/s MV A velocity: 61.50 cm/s MV E/A ratio:  0.91 Rozann Lesches MD Electronically signed by Rozann Lesches MD Signature Date/Time: 12/03/2020/3:07:25 PM    Final     ASSESSMENT & PLAN Logan Alvarez 65 y.o. male with medical history significant for stage IV HCC who presents for a follow up visit.   After review of the labs, review of the records, and discussion with the patient the patients findings are most consistent with recurrent metastatic hepatocellular carcinoma. Previously I discussed the treatment options moving forward this patient.  It appears though these treatment options were already discussed with Dr. Leamon Arnt at [facility].  The options as I see it would be bevacizumab and atezolizumab, sorafenib, or lenvatinib.  After the risks and benefits of each were discussed the patient noted he would like to proceed with bevacizumab and atezolizumab.  EGD performed 07/30/2020, no evidence of varices Marcello Moores Score: A  # Hepatocellular Carcinoma, Stage IV --At this time I favor bevacizumab and  atezolizumab q. 21 days.  Patient is considered stage IV due to peritoneal carcinomatosis of HCC. --EGD performed, no evidence of varices --Patient has recent imaging from Sept 2022, shows stable disease.  We will plan to have imaging repeated q. 3 months. Next due Dec 2022. CT scan on 12/05/2020 showed stable disease.  --patient started Cycle 1 Day 1 on 08/15/2020.  --tumor markers: CA 19-9 at 6501, CEA at 36.87 --today is Cycle 7 Day 1 of treatment. There are no barriers to proceeding with treatment today.  --Return to clinic in 3 weeks for Cycle 8 Day 1  #Muscle Soreness --Can be a side effect of both bevacizumab and atezolizumab  -- Continue Lyrica 150 mg twice daily. --Continue to monitor   #Supportive Care -- chemotherapy education performed -- port placement offered, declined at this time -- No antiemetics required as this is not an emetogenic treatment -- no pain medication required at this time.   No orders of the defined types were placed in this encounter.   All questions were answered. The patient knows to call the clinic with any problems, questions or concerns.  A total of more than 30 minutes were spent  on this encounter with face-to-face time and non-face-to-face time, including preparing to see the patient, ordering tests and/or medications, counseling the patient and coordination of care as outlined above.   Ledell Peoples, MD Department of Hematology/Oncology [place] at [facility] Phone: [phone] Pager: [phone] Email: [email or web address]  12/16/2020 8:57 AM

## 2020-12-17 LAB — CANCER ANTIGEN 19-9: CA 19-9: 6711 U/mL — ABNORMAL HIGH (ref 0–35)

## 2020-12-18 ENCOUNTER — Encounter: Payer: Self-pay | Admitting: Hematology and Oncology

## 2020-12-31 ENCOUNTER — Encounter: Payer: Self-pay | Admitting: Family Medicine

## 2021-01-01 ENCOUNTER — Other Ambulatory Visit: Payer: Self-pay | Admitting: Family Medicine

## 2021-01-01 MED ORDER — PREGABALIN 75 MG PO CAPS: 75.0000 mg | ORAL_CAPSULE | Freq: Three times a day (TID) | ORAL | 0 refills | Status: DC

## 2021-01-02 ENCOUNTER — Encounter: Payer: Self-pay | Admitting: Hematology and Oncology

## 2021-01-06 ENCOUNTER — Other Ambulatory Visit (HOSPITAL_COMMUNITY): Payer: Self-pay | Admitting: *Deleted

## 2021-01-06 DIAGNOSIS — C220 Liver cell carcinoma: Secondary | ICD-10-CM

## 2021-01-07 ENCOUNTER — Ambulatory Visit: Payer: BC Managed Care – PPO

## 2021-01-07 ENCOUNTER — Other Ambulatory Visit: Payer: Self-pay

## 2021-01-07 ENCOUNTER — Inpatient Hospital Stay (HOSPITAL_COMMUNITY): Payer: BC Managed Care – PPO | Attending: Hematology

## 2021-01-07 ENCOUNTER — Inpatient Hospital Stay (HOSPITAL_COMMUNITY): Payer: Medicare Other

## 2021-01-07 ENCOUNTER — Ambulatory Visit: Payer: BC Managed Care – PPO | Admitting: Physician Assistant

## 2021-01-07 DIAGNOSIS — I10 Essential (primary) hypertension: Secondary | ICD-10-CM | POA: Insufficient documentation

## 2021-01-07 DIAGNOSIS — K5900 Constipation, unspecified: Secondary | ICD-10-CM | POA: Insufficient documentation

## 2021-01-07 DIAGNOSIS — M25551 Pain in right hip: Secondary | ICD-10-CM | POA: Insufficient documentation

## 2021-01-07 DIAGNOSIS — M791 Myalgia, unspecified site: Secondary | ICD-10-CM | POA: Diagnosis not present

## 2021-01-07 DIAGNOSIS — R970 Elevated carcinoembryonic antigen [CEA]: Secondary | ICD-10-CM | POA: Diagnosis not present

## 2021-01-07 DIAGNOSIS — R7401 Elevation of levels of liver transaminase levels: Secondary | ICD-10-CM | POA: Diagnosis not present

## 2021-01-07 DIAGNOSIS — C786 Secondary malignant neoplasm of retroperitoneum and peritoneum: Secondary | ICD-10-CM | POA: Insufficient documentation

## 2021-01-07 DIAGNOSIS — C220 Liver cell carcinoma: Secondary | ICD-10-CM | POA: Insufficient documentation

## 2021-01-07 DIAGNOSIS — R978 Other abnormal tumor markers: Secondary | ICD-10-CM | POA: Insufficient documentation

## 2021-01-07 DIAGNOSIS — M25552 Pain in left hip: Secondary | ICD-10-CM | POA: Insufficient documentation

## 2021-01-07 DIAGNOSIS — I252 Old myocardial infarction: Secondary | ICD-10-CM | POA: Diagnosis not present

## 2021-01-07 LAB — CBC WITH DIFFERENTIAL/PLATELET
Abs Immature Granulocytes: 0.02 10*3/uL (ref 0.00–0.07)
Basophils Absolute: 0 10*3/uL (ref 0.0–0.1)
Basophils Relative: 1 %
Eosinophils Absolute: 0.2 10*3/uL (ref 0.0–0.5)
Eosinophils Relative: 2 %
HCT: 41.3 % (ref 39.0–52.0)
Hemoglobin: 13.7 g/dL (ref 13.0–17.0)
Immature Granulocytes: 0 %
Lymphocytes Relative: 13 %
Lymphs Abs: 0.8 10*3/uL (ref 0.7–4.0)
MCH: 29.2 pg (ref 26.0–34.0)
MCHC: 33.2 g/dL (ref 30.0–36.0)
MCV: 88.1 fL (ref 80.0–100.0)
Monocytes Absolute: 1 10*3/uL (ref 0.1–1.0)
Monocytes Relative: 15 %
Neutro Abs: 4.6 10*3/uL (ref 1.7–7.7)
Neutrophils Relative %: 69 %
Platelets: 272 10*3/uL (ref 150–400)
RBC: 4.69 MIL/uL (ref 4.22–5.81)
RDW: 14.1 % (ref 11.5–15.5)
WBC: 6.7 10*3/uL (ref 4.0–10.5)
nRBC: 0 % (ref 0.0–0.2)

## 2021-01-07 LAB — COMPREHENSIVE METABOLIC PANEL
ALT: 436 U/L — ABNORMAL HIGH (ref 0–44)
AST: 522 U/L — ABNORMAL HIGH (ref 15–41)
Albumin: 3.2 g/dL — ABNORMAL LOW (ref 3.5–5.0)
Alkaline Phosphatase: 620 U/L — ABNORMAL HIGH (ref 38–126)
Anion gap: 10 (ref 5–15)
BUN: 8 mg/dL (ref 8–23)
CO2: 23 mmol/L (ref 22–32)
Calcium: 9.1 mg/dL (ref 8.9–10.3)
Chloride: 96 mmol/L — ABNORMAL LOW (ref 98–111)
Creatinine, Ser: 0.69 mg/dL (ref 0.61–1.24)
GFR, Estimated: >60 mL/min (ref >60)
Glucose, Bld: 113 mg/dL — ABNORMAL HIGH (ref 70–99)
Potassium: 4.1 mmol/L (ref 3.5–5.1)
Sodium: 129 mmol/L — ABNORMAL LOW (ref 135–145)
Total Bilirubin: 1.9 mg/dL — ABNORMAL HIGH (ref 0.3–1.2)
Total Protein: 8.2 g/dL — ABNORMAL HIGH (ref 6.5–8.1)

## 2021-01-07 LAB — LACTATE DEHYDROGENASE: LDH: 272 U/L — ABNORMAL HIGH (ref 98–192)

## 2021-01-07 NOTE — Progress Notes (Signed)
Logan Alvarez [address], [place] [address]   CLINIC:  Medical Oncology/Hematology  CONSULT NOTE  Patient Care Team: Kathyrn Drown, MD as PCP - General (Family Medicine) Josue Hector, MD as PCP - Cardiology (Cardiology) Eloise Harman, DO as Consulting Physician (Internal Medicine) Derek Jack, MD as Medical Oncologist (Medical Oncology)  CHIEF COMPLAINTS/PURPOSE OF CONSULTATION:  Evaluation of hepatocellular carcinoma  HISTORY OF PRESENTING ILLNESS:  Logan Alvarez 65 y.o. male is here because of evaluation of hepatocellular carcinoma.  Today he reports feeling fair. He reports constipation; he associates this constipation with Lyrica which he takes for constant upper chest and shoulder pain as well as intermittent knee and hip pain. He went 5 days without a BM before having 2 BM over the course of an hour this morning. He is not currently taking any medication for constipation. He reports the constipation was accompanied by bloating and nausea, but he denies vomiting. He denies hematuria, nosebleeds, and haematemesis; he reports occasional rectal bleeding from tearing during BM, and blood in his mucous after blowing his nose. He reports a MI 5 weeks ago, and he has lost 10 pounds over the past 5 weeks. His appetite is poor due to changes in taste. He lives at home with his son. He is currently on disability, but previously he worked in Research officer, trade [employer]. He denies smoking history. He quit drinking alcohol 1 month ago. His paternal half-sister had bone cancer.   MEDICAL HISTORY:  Past Medical History:  Diagnosis Date   CAD (coronary artery disease)    a. 01/2013: cath showing nonobstructive disease. b. 11/2020: NSTEMI with DES to mid-LCx and medical management recommended of distal LCx disease given small vessel size   Hepatocellular carcinoma ([place])    Stage IV   Hyperlipidemia    Hypertension    Testosterone deficiency 2009    SURGICAL  HISTORY: Past Surgical History:  Procedure Laterality Date   BALLOON DILATION  07/30/2020   Procedure: BALLOON DILATION;  Surgeon: Eloise Harman, DO;  [facility];  Service: Endoscopy;;   COLONOSCOPY N/A 03/12/2017   Procedure: COLONOSCOPY;  Surgeon: Danie Binder, MD;  [facility];  Service: Endoscopy;  Laterality: N/A;  2:00   COLONOSCOPY WITH PROPOFOL N/A 06/18/2020   Procedure: COLONOSCOPY WITH PROPOFOL;  Surgeon: Eloise Harman, DO;  [facility];  Service: Endoscopy;  Laterality: N/A;  pm appt   CORONARY STENT INTERVENTION N/A 12/03/2020   Procedure: CORONARY STENT INTERVENTION;  Surgeon: Jettie Booze, MD;  Location: [facility];  Service: Cardiovascular;  Laterality: N/A;   ESOPHAGOGASTRODUODENOSCOPY (EGD) WITH PROPOFOL N/A 07/30/2020   Procedure: ESOPHAGOGASTRODUODENOSCOPY (EGD) WITH PROPOFOL;  Surgeon: Eloise Harman, DO;  [facility];  Service: Endoscopy;  Laterality: N/A;  11:45am   LEFT HEART CATH AND CORONARY ANGIOGRAPHY N/A 12/03/2020   Procedure: LEFT HEART CATH AND CORONARY ANGIOGRAPHY;  Surgeon: Jettie Booze, MD;  Location: [facility];  Service: Cardiovascular;  Laterality: N/A;   LEFT HEART CATHETERIZATION WITH CORONARY ANGIOGRAM N/A 02/01/2013   Procedure: LEFT HEART CATHETERIZATION WITH CORONARY ANGIOGRAM;  Surgeon: Josue Hector, MD;  Location: [facility];  Service: Cardiovascular;  Laterality: N/A;   NECK SURGERY     POLYPECTOMY  06/18/2020   Procedure: POLYPECTOMY;  Surgeon: Eloise Harman, DO;  [facility];  Service: Endoscopy;;  snare and biospy    SOCIAL HISTORY: Social History  Socioeconomic History   Marital status: Widowed    Spouse name: Not on file   Number of children: Not on file   Years of education: Not on file   Highest education level: Not on file  Occupational History   Not on file  Tobacco Use   Smoking status: Never   Smokeless tobacco: Never   Vaping Use   Vaping Use: Never used  Substance and Sexual Activity   Alcohol use: Not Currently    Comment: 4-6 beers a day   Drug use: No   Sexual activity: Not on file  Other Topics Concern   Not on file  Social History Narrative   Not on file   Social Determinants of Health   Financial Resource Strain: Not on file  Food Insecurity: Not on file  Transportation Needs: Not on file  Physical Activity: Not on file  Stress: Not on file  Social Connections: Not on file  Intimate Partner Violence: Not on file    FAMILY HISTORY: Family History  Problem Relation Age of Onset   Heart attack Father        28's    ALLERGIES:  is allergic to cymbalta [duloxetine hcl], lodine [etodolac], and zoloft [sertraline hcl].  MEDICATIONS:  Current Outpatient Medications  Medication Sig Dispense Refill   ALPRAZolam (XANAX) 0.5 MG tablet 1/2 tablet to 1 tablet up to 3 times a day as needed for anxiety 90 tablet 5   amLODipine (NORVASC) 10 MG tablet Take 10 mg by mouth daily.     aspirin EC 81 MG tablet Take 1 tablet (81 mg total) by mouth daily. Swallow whole. 30 tablet 11   atorvastatin (LIPITOR) 80 MG tablet Take 1 tablet (80 mg total) by mouth daily at 6 (six) AM. 30 tablet 5   dextromethorphan-guaiFENesin (ROBITUSSIN-DM) 10-100 MG/5ML liquid Take by mouth.     ezetimibe (ZETIA) 10 MG tablet Take 1 tablet (10 mg total) by mouth daily. 30 tablet 5   fluticasone (FLONASE) 50 MCG/ACT nasal spray Place into the nose.     gabapentin (NEURONTIN) 300 MG capsule Take 300 mg by mouth daily.     losartan (COZAAR) 50 MG tablet Take 0.5 tablets (25 mg total) by mouth daily. 90 tablet 1   metoprolol tartrate (LOPRESSOR) 25 MG tablet Take 0.5 tablets (12.5 mg total) by mouth 2 (two) times daily. 30 tablet 5   nitroGLYCERIN (NITROSTAT) 0.4 MG SL tablet Place 1 tablet (0.4 mg total) under the tongue every 5 (five) minutes as needed for chest pain. 25 tablet 2   pregabalin (LYRICA) 75 MG capsule Take 1  capsule (75 mg total) by mouth 3 (three) times daily. 90 capsule 0   sodium chloride (OCEAN) 0.65 % nasal spray 1 spray as needed for Congestion     ticagrelor (BRILINTA) 90 MG TABS tablet Take 1 tablet (90 mg total) by mouth 2 (two) times daily. 60 tablet 11   Vitamin D, Ergocalciferol, (DRISDOL) 1.25 MG (50000 UNIT) CAPS capsule Take one capsule po weekly 4 capsule 2   No current facility-administered medications for this visit.    REVIEW OF SYSTEMS:   Review of Systems  Constitutional:  Positive for appetite change (25%) and unexpected weight change (-10 lbs). Negative for fatigue (50%).  HENT:   Negative for nosebleeds.   Cardiovascular:  Positive for chest pain (upper).  Gastrointestinal:  Positive for abdominal pain, blood in stool, constipation and nausea. Negative for vomiting.  Genitourinary:  Negative for hematuria.   Musculoskeletal:  Positive for arthralgias (shoulder, knees, and hips).  All other systems reviewed and are negative.   PHYSICAL EXAMINATION: ECOG PERFORMANCE STATUS: 1 - Symptomatic but completely ambulatory  Vitals:   01/08/21 0821  BP: 114/70  Pulse: 83  Resp: 18  Temp: 97.8 F (36.6 C)  SpO2: 98%   Filed Weights   01/08/21 0821  Weight: 203 lb (92.1 kg)   Physical Exam Vitals reviewed.  Constitutional:      Appearance: Normal appearance.  Cardiovascular:     Rate and Rhythm: Normal rate and regular rhythm.     Pulses: Normal pulses.     Heart sounds: Normal heart sounds.  Pulmonary:     Effort: Pulmonary effort is normal.     Breath sounds: Normal breath sounds.  Abdominal:     Palpations: Abdomen is soft. There is no hepatomegaly, splenomegaly or mass.     Tenderness: There is no abdominal tenderness.  Musculoskeletal:     Right lower leg: No edema.     Left lower leg: No edema.  Lymphadenopathy:     Upper Body:     Right upper body: No supraclavicular, axillary or pectoral adenopathy.     Left upper body: No supraclavicular,  axillary or pectoral adenopathy.     Lower Body: No right inguinal adenopathy. No left inguinal adenopathy.  Neurological:     General: No focal deficit present.     Mental Status: He is alert and oriented to person, place, and time.  Psychiatric:        Mood and Affect: Mood normal.        Behavior: Behavior normal.     LABORATORY DATA:  I have reviewed the data as listed CBC Latest Ref Rng & Units 01/07/2021 12/16/2020 12/05/2020  WBC 4.0 - 10.5 K/uL 6.7 9.6 12.5(H)  Hemoglobin 13.0 - 17.0 g/dL 13.7 12.8(L) 14.0  Hematocrit 39.0 - 52.0 % 41.3 37.2(L) 39.6  Platelets 150 - 400 K/uL 272 381 308   CMP Latest Ref Rng & Units 01/07/2021 12/16/2020 12/10/2020  Glucose 70 - 99 mg/dL 113(H) 187(H) 144(H)  BUN 8 - 23 mg/dL 8 8 9   Creatinine 0.61 - 1.24 mg/dL 0.69 0.74 0.65(L)  Sodium 135 - 145 mmol/L 129(L) 132(L) 134  Potassium 3.5 - 5.1 mmol/L 4.1 4.1 4.8  Chloride 98 - 111 mmol/L 96(L) 99 96  CO2 22 - 32 mmol/L 23 22 25   Calcium 8.9 - 10.3 mg/dL 9.1 9.5 9.9  Total Protein 6.5 - 8.1 g/dL 8.2(H) 7.7 -  Total Bilirubin 0.3 - 1.2 mg/dL 1.9(H) 1.3(H) -  Alkaline Phos 38 - 126 U/L 620(H) 137(H) -  AST 15 - 41 U/L 522(H) 50(H) -  ALT 0 - 44 U/L 436(H) 44 -    RADIOGRAPHIC STUDIES: I have personally reviewed the radiological images as listed and agreed with the findings in the report. No results found.  ASSESSMENT:  Hepatocellular carcinoma with peritoneal carcinomatosis: - Liver segment 5/6 partial hepatectomy on 12/08/2017-poorly differentiated hepatocellular carcinoma, 3 cm.  T1BN X- margins. - CT pancreatic protocol on 06/25/[facility] at [facility] showed nodular studding and thickening of the ventral mesenteric fat suspicious for peritoneal carcinomatosis.  New pancreatic body lesion with pancreatic duct dilatation as well as right lateral abdominal wall deposit.  Found to have elevated CEA and CA 19-9. - Ultrasound-guided FNA of the right lateral abdominal wall soft tissue lesion on  06/26/2020. - Pathology consistent with metastatic HCC. - Evaluated by Dr. Lorenso Courier and started on Atezolizumab and bevacizumab  on 08/15/2020. - Restaging scan on 12/05/2020 with persistent omental thickening with evidence of peritoneal metastatic disease, minimally changed since 10/04/2020.  Persistent superficial nodule along the right lateral abdomen.   Social/family history: - Lives at home.  Son lives with him.  He worked as an Pharmacist, hospital on disability currently.  Non-smoker.  Last drank alcohol last month. - Sister had bone cancer.   PLAN:  Metastatic HCC with peritoneal carcinomatosis: - He seems to be tolerating combination Atezolizumab and bevacizumab reasonably well. - We reviewed his labs today which showed transaminitis with AST and ALT up to 10 times upper limit of normal.  Bilirubin is 1.9.  CBC was normal. - Will hold off on treatment today. - We will reevaluate in 1 week.  We will also check AFP level.  2.  Constipation: - He reportedly had a bowel movement this morning after a few days of constipation.  He reports occasional nausea when he is constipated.  He is not taking anything. - He was told to start on Colace 2 tablets daily and titrate up to twice daily if needed.  We will start lactulose if Colace does not help.  3.  Joint pain/myalgias: - He reported joint pains particularly in the shoulders since the start of treatment for [place].  He also has occasional pains in the knees and hips. - He has started Lyrica and is taking 75 mg increased to 3 times daily on 01/01/2021. - Likely related to immunotherapy arthralgias.  Will consider low-dose prednisone if it does not help.  4.  Transaminitis: - His AST started to increase at 50 on 12/16/2020. - He had MI and stent placement on 12/03/2020 and was started on Lipitor and Zetia on 12/09/2020. - Transaminitis correlates with starting of the above medications. - I have recommended holding Lipitor and Zetia at this time.   We will reach out to cardiology to inform them.    All questions were answered. The patient knows to call the clinic with any problems, questions or concerns.   Derek Jack, MD, 01/08/21 8:54 AM  [place] [phone]   I, Thana Ates, am acting as a scribe for Dr. Derek Jack.  I, Derek Jack MD, have reviewed the above documentation for accuracy and completeness, and I agree with the above.

## 2021-01-08 ENCOUNTER — Inpatient Hospital Stay (HOSPITAL_COMMUNITY): Payer: BC Managed Care – PPO

## 2021-01-08 ENCOUNTER — Encounter: Payer: Self-pay | Admitting: Family Medicine

## 2021-01-08 ENCOUNTER — Inpatient Hospital Stay (HOSPITAL_BASED_OUTPATIENT_CLINIC_OR_DEPARTMENT_OTHER): Payer: BC Managed Care – PPO | Admitting: Hematology

## 2021-01-08 VITALS — BP 114/70 | HR 83 | Temp 97.8°F | Resp 18 | Ht 68.0 in | Wt 203.0 lb

## 2021-01-08 DIAGNOSIS — M25552 Pain in left hip: Secondary | ICD-10-CM

## 2021-01-08 DIAGNOSIS — C786 Secondary malignant neoplasm of retroperitoneum and peritoneum: Secondary | ICD-10-CM | POA: Diagnosis not present

## 2021-01-08 DIAGNOSIS — R7401 Elevation of levels of liver transaminase levels: Secondary | ICD-10-CM

## 2021-01-08 DIAGNOSIS — I10 Essential (primary) hypertension: Secondary | ICD-10-CM

## 2021-01-08 DIAGNOSIS — M791 Myalgia, unspecified site: Secondary | ICD-10-CM | POA: Diagnosis not present

## 2021-01-08 DIAGNOSIS — K5900 Constipation, unspecified: Secondary | ICD-10-CM | POA: Diagnosis not present

## 2021-01-08 DIAGNOSIS — C220 Liver cell carcinoma: Secondary | ICD-10-CM | POA: Diagnosis not present

## 2021-01-08 DIAGNOSIS — I252 Old myocardial infarction: Secondary | ICD-10-CM | POA: Diagnosis not present

## 2021-01-08 DIAGNOSIS — M25551 Pain in right hip: Secondary | ICD-10-CM | POA: Diagnosis not present

## 2021-01-08 LAB — CANCER ANTIGEN 19-9: CA 19-9: 8641 U/mL — ABNORMAL HIGH (ref 0–35)

## 2021-01-08 LAB — CEA: CEA: 67 ng/mL — ABNORMAL HIGH (ref 0.0–4.7)

## 2021-01-08 NOTE — Progress Notes (Addendum)
AST 522, ALT 436.  No treatment today per Dr. Delton Coombes,  will recheck labs in one week.

## 2021-01-08 NOTE — Patient Instructions (Addendum)
[place] at [facility] Discharge Instructions  You were seen and examined today by Dr. Delton Coombes. Dr. Delton Coombes is a medical oncologist, meaning that he specializes in the management of cancer diagnoses with medications. Dr. Delton Coombes discussed your past medical history, including your cancer diagnosis and treatment.  The cancer in the lining of your stomach is stable. Continue treatments once your liver enzymes have stabilized. Dr. Delton Coombes has recommended follow-up scan 3 months from your last scan.  For your constipation, please use Colace one tablet twice daily. This is an over-the-counter medication. This can be increased to be sure you are having regular bowel movements. If taking two tablets of Colace twice daily does not help, please call the [facility] for further instructions.   Hold your Lipitor and Zetia until further notice due to your elevated liver enzymes.   Thank you for choosing [place] at [facility] to provide your oncology and hematology care.  To afford each patient quality time with our provider, please arrive at least 15 minutes before your scheduled appointment time.   If you have a lab appointment with the [facility] please come in thru the Main Entrance and check in at the main information desk.  You need to re-schedule your appointment should you arrive 10 or more minutes late.  We strive to give you quality time with our providers, and arriving late affects you and other patients whose appointments are after yours.  Also, if you no show three or more times for appointments you may be dismissed from the clinic at the providers discretion.     Again, thank you for choosing [facility].  Our hope is that these requests will decrease the amount of time that you wait before being seen by our physicians.       _____________________________________________________________  Should you have questions  after your visit to [facility], please contact our office at [phone] and follow the prompts.  Our office hours are 8:00 a.m. and 4:30 p.m. Monday - Friday.  Please note that voicemails left after 4:00 p.m. may not be returned until the following business day.  We are closed weekends and major holidays.  You do have access to a nurse 24-7, just call the main number to the clinic [phone] and do not press any options, hold on the line and a nurse will answer the phone.    For prescription refill requests, have your pharmacy contact our office and allow 72 hours.    Due to Covid, you will need to wear a mask upon entering the hospital. If you do not have a mask, a mask will be given to you at the Main Entrance upon arrival. For doctor visits, patients may have 1 support person age 66 or older with them. For treatment visits, patients can not have anyone with them due to social distancing guidelines and our immunocompromised population.

## 2021-01-08 NOTE — Progress Notes (Signed)
Patient seen and examined today, labs reviewed by Dr. Delton Coombes. Hold treatment today due to elevated liver enzymes, plan to recheck next week. Primary RN and Pharmacy aware.  Message sent to Bernerd Pho, PA to inform her that patient holding Lipitor and Zetia per Dr. Tomie China recommendation based on today's lab work.

## 2021-01-13 ENCOUNTER — Telehealth: Payer: Self-pay | Admitting: Cardiovascular Disease

## 2021-01-13 NOTE — Telephone Encounter (Signed)
Called patient back to let him know that there is nothing else for him to take at this time. Patient verbalized understanding.

## 2021-01-13 NOTE — Telephone Encounter (Signed)
Pt is f/u on if Dr. Johnsie Cancel has received a letter from Dr. Delton Coombes explaining why he stopped pt from taking Atoravastatin and Ezetimibe until further notice.

## 2021-01-13 NOTE — Telephone Encounter (Signed)
Dr. Delton Coombes (Oncology) has told patient to hold his lipitor and zetia until further notice due to his elevated liver enzymes. Patient is getting repeat lab work this Thursday. Patient wanted to know if there is anything else he can do for his CAD since he is unable at this time to take lipitor and zetia. Will forward to Dr. Johnsie Cancel for further advisement.

## 2021-01-15 ENCOUNTER — Other Ambulatory Visit (HOSPITAL_COMMUNITY): Payer: Self-pay

## 2021-01-15 NOTE — Progress Notes (Signed)
[facility] [address] [place], [place] [address]   CLINIC:  Medical Oncology/Hematology  PCP:  Babs Sciara, MD [address] / [place] [address] [phone]   REASON FOR VISIT:  Follow-up for hepatocellular carcinoma  PRIOR THERAPY: none  NGS Results: not done  CURRENT THERAPY: Atezolizumab + Bevacizumab q21d Maintenance  BRIEF ONCOLOGIC HISTORY:  Oncology History  Hepatocellular carcinoma (HCC)  11/12/2017 Initial Diagnosis   Hepatocellular carcinoma (HCC)   07/29/2020 Cancer Staging   Staging form: Liver, AJCC 8th Edition - Clinical stage from 07/29/2020: Stage IVB (cT1a, cN0, pM1) - Signed by Jaci Standard, MD on 07/29/2020 Stage prefix: Initial diagnosis Histologic grade (G): G3 Histologic grading system: 4 grade system    08/15/2020 -  Chemotherapy   Patient is on Treatment Plan : HCC Atezolizumab + Bevacizumab q21d Maintenance       CANCER STAGING:  Cancer Staging  Hepatocellular carcinoma (HCC) Staging form: Liver, AJCC 8th Edition - Clinical stage from 07/29/2020: Stage IVB (cT1a, cN0, pM1) - Signed by Jaci Standard, MD on 07/29/2020   INTERVAL HISTORY:  [name], a 65 y.o. male, returns for routine follow-up and consideration for next cycle of chemotherapy. Reg was last seen on 01/08/2021.  Due for cycle #8 of Atezolizumab + Bevacizumab today.   Overall, he tells me he has been feeling pretty well. His shoulder pains has improved. He stopped Lipitor and Zetia. He reports constipation. His appetite is good.   Overall, he feels ready for next cycle of chemo today.   REVIEW OF SYSTEMS:  Review of Systems  Constitutional:  Negative for appetite change (50%) and fatigue (50%).  Gastrointestinal:  Positive for constipation.  Psychiatric/Behavioral:  The patient is nervous/anxious.   All other systems reviewed and are negative.  PAST MEDICAL/SURGICAL HISTORY:  Past Medical History:  Diagnosis Date    CAD (coronary artery disease)    a. 01/2013: cath showing nonobstructive disease. b. 11/2020: NSTEMI with DES to mid-LCx and medical management recommended of distal LCx disease given small vessel size   Hepatocellular carcinoma (HCC)    Stage IV   Hyperlipidemia    Hypertension    Testosterone deficiency 2009   Past Surgical History:  Procedure Laterality Date   BALLOON DILATION  07/30/2020   Procedure: BALLOON DILATION;  Surgeon: Lanelle Bal, DO;  [facility];  Service: Endoscopy;;   COLONOSCOPY N/A 03/12/2017   Procedure: COLONOSCOPY;  Surgeon: West Bali, MD;  [facility];  Service: Endoscopy;  Laterality: N/A;  2:00   COLONOSCOPY WITH PROPOFOL N/A 06/18/2020   Procedure: COLONOSCOPY WITH PROPOFOL;  Surgeon: Lanelle Bal, DO;  [facility];  Service: Endoscopy;  Laterality: N/A;  pm appt   CORONARY STENT INTERVENTION N/A 12/03/2020   Procedure: CORONARY STENT INTERVENTION;  Surgeon: Corky Crafts, MD;  Location: [facility] INVASIVE CV LAB;  Service: Cardiovascular;  Laterality: N/A;   ESOPHAGOGASTRODUODENOSCOPY (EGD) WITH PROPOFOL N/A 07/30/2020   Procedure: ESOPHAGOGASTRODUODENOSCOPY (EGD) WITH PROPOFOL;  Surgeon: Lanelle Bal, DO;  [facility];  Service: Endoscopy;  Laterality: N/A;  11:45am   LEFT HEART CATH AND CORONARY ANGIOGRAPHY N/A 12/03/2020   Procedure: LEFT HEART CATH AND CORONARY ANGIOGRAPHY;  Surgeon: Corky Crafts, MD;  Location: [facility] INVASIVE CV LAB;  Service: Cardiovascular;  Laterality: N/A;   LEFT HEART CATHETERIZATION WITH CORONARY ANGIOGRAM N/A 02/01/2013   Procedure: LEFT HEART CATHETERIZATION WITH CORONARY ANGIOGRAM;  Surgeon: Josue Hector, MD;  [facility];  Service: Cardiovascular;  Laterality: N/A;   NECK SURGERY     POLYPECTOMY  06/18/2020   Procedure: POLYPECTOMY;  Surgeon: Eloise Harman, DO;  [facility];  Service: Endoscopy;;  snare and biospy    SOCIAL HISTORY:   Social History   Socioeconomic History   Marital status: Widowed    Spouse name: Not on file   Number of children: Not on file   Years of education: Not on file   Highest education level: Not on file  Occupational History   Not on file  Tobacco Use   Smoking status: Never   Smokeless tobacco: Never  Vaping Use   Vaping Use: Never used  Substance and Sexual Activity   Alcohol use: Not Currently    Comment: 4-6 beers a day   Drug use: No   Sexual activity: Not on file  Other Topics Concern   Not on file  Social History Narrative   Not on file   Social Determinants of Health   Financial Resource Strain: Not on file  Food Insecurity: Not on file  Transportation Needs: Not on file  Physical Activity: Not on file  Stress: Not on file  Social Connections: Not on file  Intimate Partner Violence: Not on file    FAMILY HISTORY:  Family History  Problem Relation Age of Onset   Heart attack Father        49's    CURRENT MEDICATIONS:  Current Outpatient Medications  Medication Sig Dispense Refill   ALPRAZolam (XANAX) 0.5 MG tablet 1/2 tablet to 1 tablet up to 3 times a day as needed for anxiety 90 tablet 5   amLODipine (NORVASC) 10 MG tablet Take 10 mg by mouth daily.     aspirin EC 81 MG tablet Take 1 tablet (81 mg total) by mouth daily. Swallow whole. 30 tablet 11   dextromethorphan-guaiFENesin (ROBITUSSIN-DM) 10-100 MG/5ML liquid Take by mouth.     fluticasone (FLONASE) 50 MCG/ACT nasal spray Place into the nose.     gabapentin (NEURONTIN) 300 MG capsule Take 300 mg by mouth daily.     losartan (COZAAR) 50 MG tablet Take 0.5 tablets (25 mg total) by mouth daily. 90 tablet 1   nitroGLYCERIN (NITROSTAT) 0.4 MG SL tablet Place 1 tablet (0.4 mg total) under the tongue every 5 (five) minutes as needed for chest pain. 25 tablet 2   pregabalin (LYRICA) 75 MG capsule Take 1 capsule (75 mg total) by mouth 3 (three) times daily. 90 capsule 0   sodium chloride (OCEAN) 0.65 % nasal  spray 1 spray as needed for Congestion     ticagrelor (BRILINTA) 90 MG TABS tablet Take 1 tablet (90 mg total) by mouth 2 (two) times daily. 60 tablet 11   Vitamin D, Ergocalciferol, (DRISDOL) 1.25 MG (50000 UNIT) CAPS capsule Take one capsule po weekly 4 capsule 2   metoprolol tartrate (LOPRESSOR) 25 MG tablet Take 0.5 tablets (12.5 mg total) by mouth 2 (two) times daily. 30 tablet 5   No current facility-administered medications for this visit.    ALLERGIES:  Allergies  Allergen Reactions   Cymbalta [Duloxetine Hcl]     Bad dreams   Lodine [Etodolac] Nausea And Vomiting   Zoloft [Sertraline Hcl]     REDUCED URINARY FLOW,NOCTURIA    PHYSICAL EXAM:  Performance status (ECOG): 1 - Symptomatic but completely ambulatory  Vitals:   01/16/21 0917  BP: (!) 143/87  Pulse:  75  Resp: 18  Temp: (!) 97.2 F (36.2 C)  SpO2: 98%   Wt Readings from Last 3 Encounters:  01/16/21 204 lb 5.9 oz (92.7 kg)  01/08/21 203 lb (92.1 kg)  12/16/20 208 lb 4.8 oz (94.5 kg)   Physical Exam Vitals reviewed.  Constitutional:      Appearance: Normal appearance.  Cardiovascular:     Rate and Rhythm: Normal rate and regular rhythm.     Pulses: Normal pulses.     Heart sounds: Normal heart sounds.  Pulmonary:     Effort: Pulmonary effort is normal.     Breath sounds: Normal breath sounds.  Abdominal:     Palpations: Abdomen is soft. There is no hepatomegaly, splenomegaly or mass.     Tenderness: There is no abdominal tenderness.  Neurological:     General: No focal deficit present.     Mental Status: He is alert and oriented to person, place, and time.  Psychiatric:        Mood and Affect: Mood normal.        Behavior: Behavior normal.    LABORATORY DATA:  I have reviewed the labs as listed.  CBC Latest Ref Rng & Units 01/16/2021 01/07/2021 12/16/2020  WBC 4.0 - 10.5 K/uL 6.3 6.7 9.6  Hemoglobin 13.0 - 17.0 g/dL 14.5 13.7 12.8(L)  Hematocrit 39.0 - 52.0 % 47.0 41.3 37.2(L)  Platelets 150  - 400 K/uL 327 272 381   CMP Latest Ref Rng & Units 01/16/2021 01/07/2021 12/16/2020  Glucose 70 - 99 mg/dL 200(H) 113(H) 187(H)  BUN 8 - 23 mg/dL 6(L) 8 8  Creatinine 0.61 - 1.24 mg/dL 0.62 0.69 0.74  Sodium 135 - 145 mmol/L 128(L) 129(L) 132(L)  Potassium 3.5 - 5.1 mmol/L 4.3 4.1 4.1  Chloride 98 - 111 mmol/L 94(L) 96(L) 99  CO2 22 - 32 mmol/L $RemoveB'25 23 22  '[id number]$ Calcium 8.9 - 10.3 mg/dL 9.5 9.1 9.5  Total Protein 6.5 - 8.1 g/dL 8.7(H) 8.2(H) 7.7  Total Bilirubin 0.3 - 1.2 mg/dL 1.6(H) 1.9(H) 1.3(H)  Alkaline Phos 38 - 126 U/L 859(H) 620(H) 137(H)  AST 15 - 41 U/L 405(H) 522(H) 50(H)  ALT 0 - 44 U/L 654(H) 436(H) 44    DIAGNOSTIC IMAGING:  I have independently reviewed the scans and discussed with the patient. No results found.   ASSESSMENT:  Hepatocellular carcinoma with peritoneal carcinomatosis: - Liver segment 5/6 partial hepatectomy on 12/08/2017-poorly differentiated hepatocellular carcinoma, 3 cm.  T1BN X- margins. - CT pancreatic protocol on 06/25/2020 at [facility] showed nodular studding and thickening of the ventral mesenteric fat suspicious for peritoneal carcinomatosis.  New pancreatic body lesion with pancreatic duct dilatation as well as right lateral abdominal wall deposit.  Found to have elevated CEA and CA 19-9. - Ultrasound-guided FNA of the right lateral abdominal wall soft tissue lesion on 06/26/2020. - Pathology consistent with metastatic HCC. - Evaluated by Dr. Lorenso Courier and started on Atezolizumab and bevacizumab on 08/15/2020. - Restaging scan on 12/05/2020 with persistent omental thickening with evidence of peritoneal metastatic disease, minimally changed since 10/04/2020.  Persistent superficial nodule along the right lateral abdomen.    Social/family history: - Lives at home.  Son lives with him.  He worked as an Pharmacist, hospital on disability currently.  Non-smoker.  Last drank alcohol last month. - Sister had bone cancer.   PLAN:  Metastatic HCC with  peritoneal carcinomatosis: - Last treatment with Atezolizumab and bevacizumab on 12/16/2020. - Treatment held on 01/07/2021 secondary to transaminitis. -  Today he is LFTs are worse.  Hence will hold treatment. - Will do imaging to evaluate elevated LFTs. - RTC 1 week for follow-up.  2.  Constipation: - Continue Colace 4 tablets daily.  We will consider starting lactulose if no improvement.  3.  Joint pain/myalgias: - He reports improvement in the joint pains particularly in the shoulders as he has been off of therapy. - Continue Lyrica 75 mg 3 times daily.  4.  Transaminitis: - I held his treatment due to transaminitis last week. - We also held his Lipitor and Zetia last week which was started around 12/09/2020 after MI and stent placement on 12/03/2020. - We reviewed his labs today.  AST improved to 405 from 522.  ALT has gone up to 654 from 436.  Total bilirubin is slight improvement at 1.6 from 1.9.  Alk phos is elevated at 859 from 620.  CBC was grossly normal.  He denies any GI symptoms or right upper quadrant pain. - Recommend imaging of the abdomen with CT scan with contrast.   Orders placed this encounter:  No orders of the defined types were placed in this encounter.    Derek Jack, MD [employer] [phone]   I, Thana Ates, am acting as a scribe for Dr. Derek Jack.  I, Derek Jack MD, have reviewed the above documentation for accuracy and completeness, and I agree with the above.

## 2021-01-16 ENCOUNTER — Inpatient Hospital Stay (HOSPITAL_COMMUNITY): Payer: BC Managed Care – PPO

## 2021-01-16 ENCOUNTER — Other Ambulatory Visit: Payer: Self-pay

## 2021-01-16 ENCOUNTER — Inpatient Hospital Stay (HOSPITAL_BASED_OUTPATIENT_CLINIC_OR_DEPARTMENT_OTHER): Payer: BC Managed Care – PPO | Admitting: Hematology

## 2021-01-16 VITALS — BP 143/87 | HR 75 | Temp 97.2°F | Resp 18 | Ht 68.0 in | Wt 204.4 lb

## 2021-01-16 DIAGNOSIS — M25552 Pain in left hip: Secondary | ICD-10-CM | POA: Diagnosis not present

## 2021-01-16 DIAGNOSIS — C786 Secondary malignant neoplasm of retroperitoneum and peritoneum: Secondary | ICD-10-CM | POA: Diagnosis not present

## 2021-01-16 DIAGNOSIS — C220 Liver cell carcinoma: Secondary | ICD-10-CM

## 2021-01-16 DIAGNOSIS — I10 Essential (primary) hypertension: Secondary | ICD-10-CM | POA: Diagnosis not present

## 2021-01-16 DIAGNOSIS — R7401 Elevation of levels of liver transaminase levels: Secondary | ICD-10-CM | POA: Diagnosis not present

## 2021-01-16 DIAGNOSIS — I252 Old myocardial infarction: Secondary | ICD-10-CM | POA: Diagnosis not present

## 2021-01-16 DIAGNOSIS — M25551 Pain in right hip: Secondary | ICD-10-CM | POA: Diagnosis not present

## 2021-01-16 DIAGNOSIS — M791 Myalgia, unspecified site: Secondary | ICD-10-CM | POA: Diagnosis not present

## 2021-01-16 DIAGNOSIS — K5900 Constipation, unspecified: Secondary | ICD-10-CM | POA: Diagnosis not present

## 2021-01-16 LAB — COMPREHENSIVE METABOLIC PANEL
ALT: 654 U/L — ABNORMAL HIGH (ref 0–44)
AST: 405 U/L — ABNORMAL HIGH (ref 15–41)
Albumin: 3.4 g/dL — ABNORMAL LOW (ref 3.5–5.0)
Alkaline Phosphatase: 859 U/L — ABNORMAL HIGH (ref 38–126)
Anion gap: 9 (ref 5–15)
BUN: 6 mg/dL — ABNORMAL LOW (ref 8–23)
CO2: 25 mmol/L (ref 22–32)
Calcium: 9.5 mg/dL (ref 8.9–10.3)
Chloride: 94 mmol/L — ABNORMAL LOW (ref 98–111)
Creatinine, Ser: 0.62 mg/dL (ref 0.61–1.24)
GFR, Estimated: >60 mL/min (ref >60)
Glucose, Bld: 200 mg/dL — ABNORMAL HIGH (ref 70–99)
Potassium: 4.3 mmol/L (ref 3.5–5.1)
Sodium: 128 mmol/L — ABNORMAL LOW (ref 135–145)
Total Bilirubin: 1.6 mg/dL — ABNORMAL HIGH (ref 0.3–1.2)
Total Protein: 8.7 g/dL — ABNORMAL HIGH (ref 6.5–8.1)

## 2021-01-16 LAB — CBC WITH DIFFERENTIAL/PLATELET
Abs Immature Granulocytes: 0.03 10*3/uL (ref 0.00–0.07)
Basophils Absolute: 0 10*3/uL (ref 0.0–0.1)
Basophils Relative: 1 %
Eosinophils Absolute: 0.2 10*3/uL (ref 0.0–0.5)
Eosinophils Relative: 2 %
HCT: 47 % (ref 39.0–52.0)
Hemoglobin: 14.5 g/dL (ref 13.0–17.0)
Immature Granulocytes: 1 %
Lymphocytes Relative: 14 %
Lymphs Abs: 0.9 10*3/uL (ref 0.7–4.0)
MCH: 27.7 pg (ref 26.0–34.0)
MCHC: 30.9 g/dL (ref 30.0–36.0)
MCV: 89.9 fL (ref 80.0–100.0)
Monocytes Absolute: 0.8 10*3/uL (ref 0.1–1.0)
Monocytes Relative: 13 %
Neutro Abs: 4.4 10*3/uL (ref 1.7–7.7)
Neutrophils Relative %: 69 %
Platelets: 327 10*3/uL (ref 150–400)
RBC: 5.23 MIL/uL (ref 4.22–5.81)
RDW: 15 % (ref 11.5–15.5)
WBC: 6.3 10*3/uL (ref 4.0–10.5)
nRBC: 0 % (ref 0.0–0.2)

## 2021-01-16 LAB — MAGNESIUM: Magnesium: 2.2 mg/dL (ref 1.7–2.4)

## 2021-01-16 NOTE — Patient Instructions (Signed)
[place] at [facility] Discharge Instructions   You were seen and examined today by Dr. Delton Coombes.  We will hold your treatment today.  We will get a scan of your liver to try to explain your elevated liver function tests.   Return as scheduled after CT scan to review results.    Thank you for choosing [place] at [facility] to provide your oncology and hematology care.  To afford each patient quality time with our provider, please arrive at least 15 minutes before your scheduled appointment time.   If you have a lab appointment with the [facility] please come in thru the Main Entrance and check in at the main information desk.  You need to re-schedule your appointment should you arrive 10 or more minutes late.  We strive to give you quality time with our providers, and arriving late affects you and other patients whose appointments are after yours.  Also, if you no show three or more times for appointments you may be dismissed from the clinic at the providers discretion.     Again, thank you for choosing [facility].  Our hope is that these requests will decrease the amount of time that you wait before being seen by our physicians.       _____________________________________________________________  Should you have questions after your visit to [facility], please contact our office at [phone] and follow the prompts.  Our office hours are 8:00 a.m. and 4:30 p.m. Monday - Friday.  Please note that voicemails left after 4:00 p.m. may not be returned until the following business day.  We are closed weekends and major holidays.  You do have access to a nurse 24-7, just call the main number to the clinic [phone] and do not press any options, hold on the line and a nurse will answer the phone.    For prescription refill requests, have your pharmacy contact our office and allow 72 hours.    Due to Covid,  you will need to wear a mask upon entering the hospital. If you do not have a mask, a mask will be given to you at the Main Entrance upon arrival. For doctor visits, patients may have 1 support person age 55 or older with them. For treatment visits, patients can not have anyone with them due to social distancing guidelines and our immunocompromised population.

## 2021-01-16 NOTE — Progress Notes (Signed)
No treatment today per Dr. Delton Coombes, due to elevated liver enzymes.  Patient will go for a CT scan of the liver and return to the clinic to review with the MD.

## 2021-01-17 ENCOUNTER — Other Ambulatory Visit: Payer: Self-pay | Admitting: Family Medicine

## 2021-01-17 LAB — AFP TUMOR MARKER: AFP, Serum, Tumor Marker: <1.8 ng/mL (ref 0.0–8.4)

## 2021-01-21 ENCOUNTER — Other Ambulatory Visit: Payer: Self-pay

## 2021-01-21 ENCOUNTER — Ambulatory Visit (INDEPENDENT_AMBULATORY_CARE_PROVIDER_SITE_OTHER): Payer: BC Managed Care – PPO | Admitting: Family Medicine

## 2021-01-21 ENCOUNTER — Encounter: Payer: Self-pay | Admitting: Family Medicine

## 2021-01-21 VITALS — BP 126/80 | Temp 97.2°F | Wt 206.0 lb

## 2021-01-21 DIAGNOSIS — F411 Generalized anxiety disorder: Secondary | ICD-10-CM | POA: Diagnosis not present

## 2021-01-21 DIAGNOSIS — F419 Anxiety disorder, unspecified: Secondary | ICD-10-CM

## 2021-01-21 DIAGNOSIS — C220 Liver cell carcinoma: Secondary | ICD-10-CM | POA: Diagnosis not present

## 2021-01-21 MED ORDER — ALPRAZOLAM 0.5 MG PO TABS: ORAL_TABLET | ORAL | 4 refills | Status: DC

## 2021-01-21 NOTE — Progress Notes (Signed)
° °  Subjective:    Patient ID: Logan Alvarez, male    DOB: Apr 30, 1955, 65 y.o.   MRN: [id number]  HPI Pt following up from 12/09/20 visit. Immunotherapy is on hold at the moment and CT scan is scheduled for Thursday. Pt states liver enzymes elevated. Pt states physically he feels well. Pt not taking cholesterol meds any longer.  Very nice patient Going through a lot of stress involving his cancer and treatments Liver enzymes recently went way up which could be due to the statin but also could be related to underlying cancer issues Has CAT scan coming up We had talked previously about tapering down on Xanax but it is quite obvious that he is going through a lot of stress right now for good reason.  I think it is perfectly fine for him to can continue his Xanax 3 times daily  Review of Systems     Objective:   Physical Exam  General-in no acute distress Eyes-no discharge Lungs-respiratory rate normal, CTA CV-no murmurs,RRR Extremities skin warm dry no edema Neuro grossly normal Behavior normal, alert       Assessment & Plan:   Very diet difficult situation for the patient a lot of anxiousness related to his underlying cancer diagnosis Elevated liver enzymes elevated alkaline phos Less likely and its due to his statin which was stopped over a week ago CAT scan coming up Await the results May use Xanax 0.5 mg take 1 3 times daily as needed for anxiety Follow-up again in 6 to 8 weeks

## 2021-01-23 ENCOUNTER — Ambulatory Visit (HOSPITAL_COMMUNITY)
Admission: RE | Admit: 2021-01-23 | Discharge: 2021-01-23 | Disposition: A | Discharge status: Home / Self Care | Payer: BC Managed Care – PPO | Source: Ambulatory Visit | Attending: Hematology | Admitting: Hematology

## 2021-01-23 ENCOUNTER — Other Ambulatory Visit: Payer: Self-pay

## 2021-01-23 DIAGNOSIS — C220 Liver cell carcinoma: Secondary | ICD-10-CM | POA: Diagnosis not present

## 2021-01-23 DIAGNOSIS — C786 Secondary malignant neoplasm of retroperitoneum and peritoneum: Secondary | ICD-10-CM | POA: Diagnosis not present

## 2021-01-23 DIAGNOSIS — K7689 Other specified diseases of liver: Secondary | ICD-10-CM | POA: Diagnosis not present

## 2021-01-23 DIAGNOSIS — C229 Malignant neoplasm of liver, not specified as primary or secondary: Secondary | ICD-10-CM | POA: Diagnosis not present

## 2021-01-23 DIAGNOSIS — K7460 Unspecified cirrhosis of liver: Secondary | ICD-10-CM | POA: Diagnosis not present

## 2021-01-23 MED ORDER — IOHEXOL 350 MG/ML SOLN
80.0000 mL | Freq: Once | INTRAVENOUS | Status: AC | PRN
  Administered 2021-01-23: 16:00:00 80 mL via INTRAVENOUS

## 2021-01-24 ENCOUNTER — Other Ambulatory Visit: Payer: Self-pay | Admitting: Family Medicine

## 2021-01-25 ENCOUNTER — Other Ambulatory Visit: Payer: Self-pay | Admitting: Family Medicine

## 2021-01-25 ENCOUNTER — Encounter: Payer: Self-pay | Admitting: Family Medicine

## 2021-01-27 ENCOUNTER — Other Ambulatory Visit: Payer: Self-pay | Admitting: Family Medicine

## 2021-01-28 ENCOUNTER — Ambulatory Visit: Payer: BC Managed Care – PPO

## 2021-01-28 ENCOUNTER — Other Ambulatory Visit: Payer: BC Managed Care – PPO

## 2021-01-28 ENCOUNTER — Ambulatory Visit: Payer: BC Managed Care – PPO | Admitting: Hematology and Oncology

## 2021-01-28 NOTE — Progress Notes (Signed)
Logan Alvarez, [place] [address]   CLINIC:  Medical Oncology/Hematology  PCP:  Kathyrn Drown, MD [address] [place] / [place] [address] [phone]   REASON FOR VISIT:  Follow-up for hepatocellular carcinoma  PRIOR THERAPY: none  NGS Results: not done  CURRENT THERAPY: Atezolizumab + Bevacizumab q21d Maintenance  BRIEF ONCOLOGIC HISTORY:  Oncology History  Hepatocellular carcinoma ([place])  11/12/2017 Initial Diagnosis   Hepatocellular carcinoma ([place])   07/29/2020 Cancer Staging   Staging form: Liver, AJCC 8th Edition - Clinical stage from 07/29/2020: Stage IVB (cT1a, cN0, pM1) - Signed by Orson Slick, MD on 07/29/2020 Stage prefix: Initial diagnosis Histologic grade (G): G3 Histologic grading system: 4 grade system    08/15/2020 -  Chemotherapy   Patient is on Treatment Plan : [place] Atezolizumab + Bevacizumab q21d Maintenance       CANCER STAGING:  Cancer Staging  Hepatocellular carcinoma ([place]) Staging form: Liver, AJCC 8th Edition - Clinical stage from 07/29/2020: Stage IVB (cT1a, cN0, pM1) - Signed by Orson Slick, MD on 07/29/2020   INTERVAL HISTORY:  Logan Alvarez, a 65 y.o. male, returns for routine follow-up and consideration for next cycle of chemotherapy. Logan Alvarez was last seen on 01/16/2021.  Due for cycle #8 of Atezolizumab + Bevacizumab today.   Overall, he tells me he has been feeling pretty well. He reports he has not had any alcohol over the past month. He reports constipation; when he goes 3 days without a BM he has abdominal pain. He denies diarrhea and bleeding. His appetite is good, and he denies ankle swellings. He reports a recent tooth abscess for which he was given amoxicillin.    Overall, he feels ready for next cycle of chemo today.   REVIEW OF SYSTEMS:  Review of Systems  Constitutional:  Negative for appetite change and fatigue (50%).  HENT:   Negative for nosebleeds.   Respiratory:   Positive for cough. Negative for hemoptysis.   Cardiovascular:  Negative for leg swelling.  Gastrointestinal:  Positive for abdominal pain (with constipation) and constipation. Negative for blood in stool and diarrhea.  Genitourinary:  Negative for hematuria.   Hematological:  Does not bruise/bleed easily.  All other systems reviewed and are negative.  PAST MEDICAL/SURGICAL HISTORY:  Past Medical History:  Diagnosis Date   CAD (coronary artery disease)    a. 01/2013: cath showing nonobstructive disease. b. 11/2020: NSTEMI with DES to mid-LCx and medical management recommended of distal LCx disease given small vessel size   Hepatocellular carcinoma ([place])    Stage IV   Hyperlipidemia    Hypertension    Testosterone deficiency 2009   Past Surgical History:  Procedure Laterality Date   BALLOON DILATION  07/30/2020   Procedure: BALLOON DILATION;  Surgeon: Eloise Harman, DO;  [facility];  Service: Endoscopy;;   COLONOSCOPY N/A 03/12/2017   Procedure: COLONOSCOPY;  Surgeon: Danie Binder, MD;  [facility];  Service: Endoscopy;  Laterality: N/A;  2:00   COLONOSCOPY WITH PROPOFOL N/A 06/18/2020   Procedure: COLONOSCOPY WITH PROPOFOL;  Surgeon: Eloise Harman, DO;  [facility];  Service: Endoscopy;  Laterality: N/A;  pm appt   CORONARY STENT INTERVENTION N/A 12/03/2020   Procedure: CORONARY STENT INTERVENTION;  Surgeon: Jettie Booze, MD;  Location: [facility];  Service: Cardiovascular;  Laterality: N/A;   ESOPHAGOGASTRODUODENOSCOPY (EGD) WITH PROPOFOL N/A 07/30/2020   Procedure: ESOPHAGOGASTRODUODENOSCOPY (  EGD) WITH PROPOFOL;  Surgeon: Eloise Harman, DO;  [facility];  Service: Endoscopy;  Laterality: N/A;  11:45am   LEFT HEART CATH AND CORONARY ANGIOGRAPHY N/A 12/03/2020   Procedure: LEFT HEART CATH AND CORONARY ANGIOGRAPHY;  Surgeon: Jettie Booze, MD;  Location: [facility];  Service: Cardiovascular;   Laterality: N/A;   LEFT HEART CATHETERIZATION WITH CORONARY ANGIOGRAM N/A 02/01/2013   Procedure: LEFT HEART CATHETERIZATION WITH CORONARY ANGIOGRAM;  Surgeon: Josue Hector, MD;  Location: [facility];  Service: Cardiovascular;  Laterality: N/A;   NECK SURGERY     POLYPECTOMY  06/18/2020   Procedure: POLYPECTOMY;  Surgeon: Eloise Harman, DO;  [facility];  Service: Endoscopy;;  snare and biospy    SOCIAL HISTORY:  Social History   Socioeconomic History   Marital status: Widowed    Spouse name: Not on file   Number of children: Not on file   Years of education: Not on file   Highest education level: Not on file  Occupational History   Not on file  Tobacco Use   Smoking status: Never   Smokeless tobacco: Never  Vaping Use   Vaping Use: Never used  Substance and Sexual Activity   Alcohol use: Not Currently    Comment: 4-6 beers a day   Drug use: No   Sexual activity: Not on file  Other Topics Concern   Not on file  Social History Narrative   Not on file   Social Determinants of Health   Financial Resource Strain: Not on file  Food Insecurity: Not on file  Transportation Needs: Not on file  Physical Activity: Not on file  Stress: Not on file  Social Connections: Not on file  Intimate Partner Violence: Not on file    FAMILY HISTORY:  Family History  Problem Relation Age of Onset   Heart attack Father        82's    CURRENT MEDICATIONS:  Current Outpatient Medications  Medication Sig Dispense Refill   ALPRAZolam (XANAX) 0.5 MG tablet Take one tablet po TID 90 tablet 4   aspirin EC 81 MG tablet Take 1 tablet (81 mg total) by mouth daily. Swallow whole. 30 tablet 11   dextromethorphan-guaiFENesin (ROBITUSSIN-DM) 10-100 MG/5ML liquid Take by mouth.     fluticasone (FLONASE) 50 MCG/ACT nasal spray Place into the nose.     losartan (COZAAR) 25 MG tablet Take 25 mg by mouth daily.     losartan (COZAAR) 50 MG tablet Take 0.5 tablets (25 mg total) by  mouth daily. 90 tablet 1   metoprolol tartrate (LOPRESSOR) 25 MG tablet Take 0.5 tablets (12.5 mg total) by mouth 2 (two) times daily. 30 tablet 5   nitroGLYCERIN (NITROSTAT) 0.4 MG SL tablet Place 1 tablet (0.4 mg total) under the tongue every 5 (five) minutes as needed for chest pain. 25 tablet 2   pregabalin (LYRICA) 75 MG capsule Take 1 capsule (75 mg total) by mouth 3 (three) times daily. 90 capsule 0   sodium chloride (OCEAN) 0.65 % nasal spray 1 spray as needed for Congestion     ticagrelor (BRILINTA) 90 MG TABS tablet Take 1 tablet (90 mg total) by mouth 2 (two) times daily. 60 tablet 11   Vitamin D, Ergocalciferol, (DRISDOL) 1.25 MG (50000 UNIT) CAPS capsule Take one capsule po weekly 4 capsule 2   No current facility-administered medications for this visit.    ALLERGIES:  Allergies  Allergen Reactions   Cymbalta [  Duloxetine Hcl]     Bad dreams   Lodine [Etodolac] Nausea And Vomiting   Zoloft [Sertraline Hcl]     REDUCED URINARY FLOW,NOCTURIA    PHYSICAL EXAM:  Performance status (ECOG): 1 - Symptomatic but completely ambulatory  Vitals:   01/29/21 0852  BP: 125/85  Pulse: 86  Resp: 18  Temp: 97.6 F (36.4 C)  SpO2: 99%   Wt Readings from Last 3 Encounters:  01/29/21 201 lb 6.4 oz (91.4 kg)  01/21/21 206 lb (93.4 kg)  01/16/21 204 lb 5.9 oz (92.7 kg)   Physical Exam Vitals reviewed.  Constitutional:      Appearance: Normal appearance.  Cardiovascular:     Rate and Rhythm: Normal rate and regular rhythm.     Pulses: Normal pulses.     Heart sounds: Normal heart sounds.  Pulmonary:     Effort: Pulmonary effort is normal.     Breath sounds: Normal breath sounds.  Neurological:     General: No focal deficit present.     Mental Status: He is alert and oriented to person, place, and time.  Psychiatric:        Mood and Affect: Mood normal.        Behavior: Behavior normal.    LABORATORY DATA:  I have reviewed the labs as listed.  CBC Latest Ref Rng & Units  01/29/2021 01/16/2021 01/07/2021  WBC 4.0 - 10.5 K/uL 6.6 6.3 6.7  Hemoglobin 13.0 - 17.0 g/dL 15.1 14.5 13.7  Hematocrit 39.0 - 52.0 % 45.2 47.0 41.3  Platelets 150 - 400 K/uL 246 327 272   CMP Latest Ref Rng & Units 01/29/2021 01/16/2021 01/07/2021  Glucose 70 - 99 mg/dL 128(H) 200(H) 113(H)  BUN 8 - 23 mg/dL 6(L) 6(L) 8  Creatinine 0.61 - 1.24 mg/dL 0.56(L) 0.62 0.69  Sodium 135 - 145 mmol/L 131(L) 128(L) 129(L)  Potassium 3.5 - 5.1 mmol/L 4.0 4.3 4.1  Chloride 98 - 111 mmol/L 98 94(L) 96(L)  CO2 22 - 32 mmol/L $RemoveB'24 25 23  'DBBYYoIa$ Calcium 8.9 - 10.3 mg/dL 9.5 9.5 9.1  Total Protein 6.5 - 8.1 g/dL 8.6(H) 8.7(H) 8.2(H)  Total Bilirubin 0.3 - 1.2 mg/dL 1.1 1.6(H) 1.9(H)  Alkaline Phos 38 - 126 U/L 237(H) 859(H) 620(H)  AST 15 - 41 U/L 37 405(H) 522(H)  ALT 0 - 44 U/L 61(H) 654(H) 436(H)    DIAGNOSTIC IMAGING:  I have independently reviewed the scans and discussed with the patient. CT Abdomen W Contrast  Result Date: 01/24/2021 CLINICAL DATA:  Elevated liver function tests, liver cancer with peritoneal metastatic disease. EXAM: CT ABDOMEN WITH CONTRAST TECHNIQUE: Multidetector CT imaging of the abdomen was performed using the standard protocol following bolus administration of intravenous contrast. CONTRAST:  70mL OMNIPAQUE IOHEXOL 350 MG/ML SOLN COMPARISON:  12/05/2020. FINDINGS: Lower chest: 3 mm posterior left lower lobe nodule unchanged. No pleural fluid. Heart size normal. Atherosclerotic calcification of the aorta and coronary arteries. No pericardial or pleural effusion. Distal esophagus is unremarkable. Distal periesophageal lymph nodes are not enlarged by CT size criteria. Hepatobiliary: Liver margin is irregular. No areas of abnormal arterial phase enhancement. Subcentimeter low-attenuation lesions in the liver are too small to characterize. Cholecystectomy. No biliary ductal dilatation. Pancreas: Decreased attenuation of the distal pancreatic body and tail, as seen on prior imaging. No  ductal dilatation or gland atrophy. Spleen: Negative. Adrenals/Urinary Tract: Adrenal glands are unremarkable. 2.6 cm probable cyst in the lower pole right kidney. Subcentimeter low-attenuation lesion in the lower pole left kidney, too  small to definitively characterize but statistically, a cyst is likely. Stomach/Bowel: Stomach and visualized portions of the small bowel and colon are unremarkable. Vascular/Lymphatic: Atherosclerotic calcification of the aorta. No pathologically enlarged lymph nodes. Other: Interval progression in hazy/nodular soft tissue thickening throughout the small bowel mesentery, omentum and paracolic gutters. No free fluid. Musculoskeletal: Similar L1 compression fraction. No worrisome lytic or sclerotic lesions. IMPRESSION: 1. Interval progression in peritoneal carcinomatosis. 2. Cirrhosis. Subcentimeter low-attenuation lesions in the liver are too small to characterize. 3. Aortic atherosclerosis (ICD10-I70.0). Coronary artery calcification. Electronically Signed   By: Lorin Picket M.D.   On: 01/24/2021 13:21     ASSESSMENT:  Hepatocellular carcinoma with peritoneal carcinomatosis: - Liver segment 5/6 partial hepatectomy on 12/08/2017-poorly differentiated hepatocellular carcinoma, 3 cm.  T1BN X- margins. - CT pancreatic protocol on 06/25/2020 at [facility] showed nodular studding and thickening of the ventral mesenteric fat suspicious for peritoneal carcinomatosis.  New pancreatic body lesion with pancreatic duct dilatation as well as right lateral abdominal wall deposit.  Found to have elevated CEA and CA 19-9. - Ultrasound-guided FNA of the right lateral abdominal wall soft tissue lesion on 06/26/2020. - Pathology consistent with metastatic HCC. - Evaluated by Dr. Lorenso Courier and started on Atezolizumab and bevacizumab on 08/15/2020. - Restaging scan on 12/05/2020 with persistent omental thickening with evidence of peritoneal metastatic disease, minimally changed since 10/04/2020.   Persistent superficial nodule along the right lateral abdomen.    Social/family history: - Lives at home.  Son lives with him.  He worked as an Pharmacist, hospital on disability currently.  Non-smoker.  Last drank alcohol last month. - Sister had bone cancer.   PLAN:  Metastatic HCC with peritoneal carcinomatosis: - We have reviewed CT of the abdomen from 01/23/2021. - There is interval progression in the hazy/nodular soft tissue thickening throughout the small bowel mesentery, omentum and paracolic gutters with no free fluid.  Cirrhosis, with subcentimeter low-attenuation lesions in the liver, too small to characterize. - His AFP was undetectable.  Last CA 19-9 was 8641 and CEA was 67. - There was no significant change in the mesentery.  Hence recommend resuming Atezolizumab and bevacizumab combination today. - Recommend follow-up in 3 weeks with repeat tumor markers. - We will plan to repeat scan again in 2 months.  Patient and daughter were agreeable.  2.  Constipation: - Continue Colace.  If it does not help, consider lactulose.  3.  Joint pain/myalgias: - He has joint pains involving the shoulders and sternum. - Continue Lyrica 75 mg 3 times daily which is helping.  4.  Transaminitis: - Transaminitis has improved after we held his Lipitor and Zetia. - Today AST is normal.  ALT improved to 61.  Alk phos is 237.  Bilirubin is normal. - He will reach out to cardiology for dose and drug modification.   Orders placed this encounter:  No orders of the defined types were placed in this encounter.    Derek Jack, MD [place] [phone]   I, Thana Ates, am acting as a scribe for Dr. Derek Jack.  I, Derek Jack MD, have reviewed the above documentation for accuracy and completeness, and I agree with the above.

## 2021-01-29 ENCOUNTER — Inpatient Hospital Stay (HOSPITAL_COMMUNITY): Payer: BC Managed Care – PPO

## 2021-01-29 ENCOUNTER — Inpatient Hospital Stay (HOSPITAL_BASED_OUTPATIENT_CLINIC_OR_DEPARTMENT_OTHER): Payer: BC Managed Care – PPO | Admitting: Hematology

## 2021-01-29 ENCOUNTER — Other Ambulatory Visit: Payer: Self-pay

## 2021-01-29 ENCOUNTER — Encounter (HOSPITAL_COMMUNITY): Payer: Self-pay | Admitting: Hematology

## 2021-01-29 VITALS — BP 132/75 | HR 79 | Temp 97.8°F | Resp 18

## 2021-01-29 VITALS — BP 125/85 | HR 86 | Temp 97.6°F | Resp 18 | Ht 68.0 in | Wt 201.4 lb

## 2021-01-29 DIAGNOSIS — C786 Secondary malignant neoplasm of retroperitoneum and peritoneum: Secondary | ICD-10-CM | POA: Diagnosis not present

## 2021-01-29 DIAGNOSIS — M25551 Pain in right hip: Secondary | ICD-10-CM | POA: Diagnosis not present

## 2021-01-29 DIAGNOSIS — R7401 Elevation of levels of liver transaminase levels: Secondary | ICD-10-CM | POA: Diagnosis not present

## 2021-01-29 DIAGNOSIS — I10 Essential (primary) hypertension: Secondary | ICD-10-CM | POA: Diagnosis not present

## 2021-01-29 DIAGNOSIS — C220 Liver cell carcinoma: Secondary | ICD-10-CM

## 2021-01-29 DIAGNOSIS — M791 Myalgia, unspecified site: Secondary | ICD-10-CM | POA: Diagnosis not present

## 2021-01-29 DIAGNOSIS — M25552 Pain in left hip: Secondary | ICD-10-CM | POA: Diagnosis not present

## 2021-01-29 DIAGNOSIS — I252 Old myocardial infarction: Secondary | ICD-10-CM | POA: Diagnosis not present

## 2021-01-29 DIAGNOSIS — K5900 Constipation, unspecified: Secondary | ICD-10-CM | POA: Diagnosis not present

## 2021-01-29 LAB — COMPREHENSIVE METABOLIC PANEL
ALT: 61 U/L — ABNORMAL HIGH (ref 0–44)
AST: 37 U/L (ref 15–41)
Albumin: 3.6 g/dL (ref 3.5–5.0)
Alkaline Phosphatase: 237 U/L — ABNORMAL HIGH (ref 38–126)
Anion gap: 9 (ref 5–15)
BUN: 6 mg/dL — ABNORMAL LOW (ref 8–23)
CO2: 24 mmol/L (ref 22–32)
Calcium: 9.5 mg/dL (ref 8.9–10.3)
Chloride: 98 mmol/L (ref 98–111)
Creatinine, Ser: 0.56 mg/dL — ABNORMAL LOW (ref 0.61–1.24)
GFR, Estimated: >60 mL/min (ref >60)
Glucose, Bld: 128 mg/dL — ABNORMAL HIGH (ref 70–99)
Potassium: 4 mmol/L (ref 3.5–5.1)
Sodium: 131 mmol/L — ABNORMAL LOW (ref 135–145)
Total Bilirubin: 1.1 mg/dL (ref 0.3–1.2)
Total Protein: 8.6 g/dL — ABNORMAL HIGH (ref 6.5–8.1)

## 2021-01-29 LAB — CBC WITH DIFFERENTIAL/PLATELET
Abs Immature Granulocytes: 0.01 10*3/uL (ref 0.00–0.07)
Basophils Absolute: 0.1 10*3/uL (ref 0.0–0.1)
Basophils Relative: 1 %
Eosinophils Absolute: 0.1 10*3/uL (ref 0.0–0.5)
Eosinophils Relative: 2 %
HCT: 45.2 % (ref 39.0–52.0)
Hemoglobin: 15.1 g/dL (ref 13.0–17.0)
Immature Granulocytes: 0 %
Lymphocytes Relative: 13 %
Lymphs Abs: 0.9 10*3/uL (ref 0.7–4.0)
MCH: 29.2 pg (ref 26.0–34.0)
MCHC: 33.4 g/dL (ref 30.0–36.0)
MCV: 87.4 fL (ref 80.0–100.0)
Monocytes Absolute: 1.1 10*3/uL — ABNORMAL HIGH (ref 0.1–1.0)
Monocytes Relative: 16 %
Neutro Abs: 4.5 10*3/uL (ref 1.7–7.7)
Neutrophils Relative %: 68 %
Platelets: 246 10*3/uL (ref 150–400)
RBC: 5.17 MIL/uL (ref 4.22–5.81)
RDW: 15.1 % (ref 11.5–15.5)
WBC: 6.6 10*3/uL (ref 4.0–10.5)
nRBC: 0 % (ref 0.0–0.2)

## 2021-01-29 LAB — MAGNESIUM: Magnesium: 2.2 mg/dL (ref 1.7–2.4)

## 2021-01-29 MED ORDER — SODIUM CHLORIDE 0.9 % IV SOLN
15.0000 mg/kg | Freq: Once | INTRAVENOUS | Status: AC
  Administered 2021-01-29: 13:00:00 1500 mg via INTRAVENOUS
  Filled 2021-01-29: qty 48

## 2021-01-29 MED ORDER — SODIUM CHLORIDE 0.9 % IV SOLN: Freq: Once | INTRAVENOUS | Status: AC

## 2021-01-29 MED ORDER — SODIUM CHLORIDE 0.9 % IV SOLN
1200.0000 mg | Freq: Once | INTRAVENOUS | Status: AC
  Administered 2021-01-29: 11:00:00 1200 mg via INTRAVENOUS
  Filled 2021-01-29: qty 20

## 2021-01-29 NOTE — Progress Notes (Signed)
Patient has been examined by Dr. Katragadda, and vital signs and labs have been reviewed. ANC, Creatinine, LFTs, hemoglobin, and platelets are within treatment parameters per M.D. - pt may proceed with treatment.    °

## 2021-01-29 NOTE — Progress Notes (Signed)
Patient presents today for chemotherapy infusion.  Patient is in satisfactory condition with no complaints voiced.  Vital signs are stable.  We will proceed with treatment per MD orders.   Patient tolerated treatment well with no complaints voiced.  Patient left ambulatory in stable condition.  Vital signs stable at discharge.  Follow up as scheduled.    

## 2021-01-29 NOTE — Patient Instructions (Signed)
[place] at [facility] Discharge Instructions   You were seen and examined today by Dr. Delton Coombes.  He reviewed the results of your CT scan, which was stable. We will repeat scans in 2 months.  Your lab work was reviewed with you by Dr. Raliegh Ip - liver enzymes have improved significantly.  We will proceed with your treatment today.  Return as scheduled in 3 weeks.    Thank you for choosing [place] at [facility] to provide your oncology and hematology care.  To afford each patient quality time with our provider, please arrive at least 15 minutes before your scheduled appointment time.   If you have a lab appointment with the [facility] please come in thru the Main Entrance and check in at the main information desk.  You need to re-schedule your appointment should you arrive 10 or more minutes late.  We strive to give you quality time with our providers, and arriving late affects you and other patients whose appointments are after yours.  Also, if you no show three or more times for appointments you may be dismissed from the clinic at the providers discretion.     Again, thank you for choosing [facility].  Our hope is that these requests will decrease the amount of time that you wait before being seen by our physicians.       _____________________________________________________________  Should you have questions after your visit to [facility], please contact our office at [phone] and follow the prompts.  Our office hours are 8:00 a.m. and 4:30 p.m. Monday - Friday.  Please note that voicemails left after 4:00 p.m. may not be returned until the following business day.  We are closed weekends and major holidays.  You do have access to a nurse 24-7, just call the main number to the clinic [phone] and do not press any options, hold on the line and a nurse will answer the phone.    For prescription refill  requests, have your pharmacy contact our office and allow 72 hours.    Due to Covid, you will need to wear a mask upon entering the hospital. If you do not have a mask, a mask will be given to you at the Main Entrance upon arrival. For doctor visits, patients may have 1 support person age 87 or older with them. For treatment visits, patients can not have anyone with them due to social distancing guidelines and our immunocompromised population.

## 2021-01-29 NOTE — Patient Instructions (Signed)
[facility]  Discharge Instructions: Thank you for choosing [facility] to provide your oncology and hematology care.  If you have a lab appointment with the Cancer Center, please come in thru the Main Entrance and check in at the main information desk.  Wear comfortable clothing and clothing appropriate for easy access to any Portacath or PICC line.   We strive to give you quality time with your provider. You may need to reschedule your appointment if you arrive late (15 or more minutes).  Arriving late affects you and other patients whose appointments are after yours.  Also, if you miss three or more appointments without notifying the office, you may be dismissed from the clinic at the provider's discretion.      For prescription refill requests, have your pharmacy contact our office and allow 72 hours for refills to be completed.        To help prevent nausea and vomiting after your treatment, we encourage you to take your nausea medication as directed.  BELOW ARE SYMPTOMS THAT SHOULD BE REPORTED IMMEDIATELY: *FEVER GREATER THAN 100.4 F (38 C) OR HIGHER *CHILLS OR SWEATING *NAUSEA AND VOMITING THAT IS NOT CONTROLLED WITH YOUR NAUSEA MEDICATION *UNUSUAL SHORTNESS OF BREATH *UNUSUAL BRUISING OR BLEEDING *URINARY PROBLEMS (pain or burning when urinating, or frequent urination) *BOWEL PROBLEMS (unusual diarrhea, constipation, pain near the anus) TENDERNESS IN MOUTH AND THROAT WITH OR WITHOUT PRESENCE OF ULCERS (sore throat, sores in mouth, or a toothache) UNUSUAL RASH, SWELLING OR PAIN  UNUSUAL VAGINAL DISCHARGE OR ITCHING   Items with * indicate a potential emergency and should be followed up as soon as possible or go to the Emergency Department if any problems should occur.  Please show the CHEMOTHERAPY ALERT CARD or IMMUNOTHERAPY ALERT CARD at check-in to the Emergency Department and triage nurse.  Should you have questions after your visit or need to cancel  or reschedule your appointment, please contact [facility] [phone]  and follow the prompts.  Office hours are 8:00 a.m. to 4:30 p.m. Monday - Friday. Please note that voicemails left after 4:00 p.m. may not be returned until the following business day.  We are closed weekends and major holidays. You have access to a nurse at all times for urgent questions. Please call the main number to the clinic [phone] and follow the prompts.  For any non-urgent questions, you may also contact your provider using MyChart. We now offer e-Visits for anyone 18 and older to request care online for non-urgent symptoms. For details visit [email or web address].   Also download the MyChart app! Go to the app store, search "MyChart", open the app, select [facility], and log in with your MyChart username and password.  Due to Covid, a mask is required upon entering the hospital/clinic. If you do not have a mask, one will be given to you upon arrival. For doctor visits, patients may have 1 support person aged 18 or older with them. For treatment visits, patients cannot have anyone with them due to current Covid guidelines and our immunocompromised population.  

## 2021-01-30 ENCOUNTER — Other Ambulatory Visit: Payer: Self-pay | Admitting: Family Medicine

## 2021-01-30 NOTE — Telephone Encounter (Signed)
Please verify with patient is he currently taking losartan if so verify dose.  If he needs refills May have 6 months refill.  If he is no longer taking losartan please cancel thank you

## 2021-01-31 ENCOUNTER — Telehealth: Payer: Self-pay | Admitting: Cardiovascular Disease

## 2021-01-31 MED ORDER — TICAGRELOR 90 MG PO TABS: 90.0000 mg | ORAL_TABLET | Freq: Two times a day (BID) | ORAL | 1 refills | Status: DC

## 2021-01-31 NOTE — Telephone Encounter (Signed)
°*  STAT* If patient is at the pharmacy, call can be transferred to refill team.   1. Which medications need to be refilled? (please list name of each medication and dose if known)  ticagrelor (BRILINTA) 90 MG TABS tablet  2. Which pharmacy/location (including street and city if local pharmacy) is medication to be sent to? [facility]  3. Do they need a 30 day or 90 day supply? 90 day   Patient states this is the last day and needs the refill sent today.

## 2021-01-31 NOTE — Telephone Encounter (Signed)
[facility],br[facility] 90 mg bid #180, refill1

## 2021-01-31 NOTE — Telephone Encounter (Signed)
May have 6 months on the losartan As for the vitamin D I would recommend he go OTC 2000 units vitamin D daily

## 2021-02-05 ENCOUNTER — Encounter: Payer: Self-pay | Admitting: Family Medicine

## 2021-02-05 ENCOUNTER — Encounter: Payer: Self-pay | Admitting: Cardiovascular Disease

## 2021-02-05 ENCOUNTER — Other Ambulatory Visit: Payer: Self-pay | Admitting: Family Medicine

## 2021-02-05 MED ORDER — MOLNUPIRAVIR EUA 200MG CAPSULE: 4.0000 | ORAL_CAPSULE | Freq: Two times a day (BID) | ORAL | 0 refills | Status: AC

## 2021-02-06 ENCOUNTER — Other Ambulatory Visit: Payer: Self-pay

## 2021-02-06 ENCOUNTER — Other Ambulatory Visit (HOSPITAL_COMMUNITY): Payer: BC Managed Care – PPO

## 2021-02-06 ENCOUNTER — Ambulatory Visit (HOSPITAL_COMMUNITY): Payer: BC Managed Care – PPO

## 2021-02-06 ENCOUNTER — Ambulatory Visit (HOSPITAL_COMMUNITY): Payer: BC Managed Care – PPO | Admitting: Hematology

## 2021-02-06 MED ORDER — TICAGRELOR 90 MG PO TABS: 90.0000 mg | ORAL_TABLET | Freq: Two times a day (BID) | ORAL | 0 refills | Status: DC

## 2021-02-10 ENCOUNTER — Other Ambulatory Visit: Payer: Self-pay | Admitting: Family Medicine

## 2021-02-10 ENCOUNTER — Telehealth: Payer: Self-pay | Admitting: Family Medicine

## 2021-02-10 MED ORDER — ALPRAZOLAM 0.5 MG PO TABS: ORAL_TABLET | ORAL | 4 refills | Status: DC

## 2021-02-10 NOTE — Telephone Encounter (Signed)
Patient called re his ALPRAZolam rx. He states the doctor discussed him taking up to 4 times a day. He only has 3 left. [phone]

## 2021-02-10 NOTE — Telephone Encounter (Signed)
Patient notified and verbalized understanding. 

## 2021-02-10 NOTE — Telephone Encounter (Signed)
He is dealing with end-stage cancer, I will make adjustments in his medication and go ahead and send these in

## 2021-02-12 ENCOUNTER — Other Ambulatory Visit: Payer: Self-pay

## 2021-02-12 ENCOUNTER — Telehealth: Payer: Self-pay | Admitting: *Deleted

## 2021-02-12 ENCOUNTER — Telehealth: Payer: Self-pay | Admitting: Cardiovascular Disease

## 2021-02-12 ENCOUNTER — Emergency Department (HOSPITAL_COMMUNITY)
Admission: EM | Admit: 2021-02-12 | Discharge: 2021-02-12 | Disposition: A | Discharge status: Home / Self Care | Payer: Medicare Other | Attending: Emergency Medicine | Admitting: Emergency Medicine

## 2021-02-12 ENCOUNTER — Encounter (HOSPITAL_COMMUNITY): Payer: Self-pay

## 2021-02-12 ENCOUNTER — Emergency Department (HOSPITAL_COMMUNITY): Payer: Medicare Other

## 2021-02-12 DIAGNOSIS — R0789 Other chest pain: Secondary | ICD-10-CM | POA: Diagnosis not present

## 2021-02-12 DIAGNOSIS — Z8505 Personal history of malignant neoplasm of liver: Secondary | ICD-10-CM | POA: Diagnosis not present

## 2021-02-12 DIAGNOSIS — Z7982 Long term (current) use of aspirin: Secondary | ICD-10-CM | POA: Diagnosis not present

## 2021-02-12 DIAGNOSIS — I2510 Atherosclerotic heart disease of native coronary artery without angina pectoris: Secondary | ICD-10-CM | POA: Insufficient documentation

## 2021-02-12 DIAGNOSIS — S2231XA Fracture of one rib, right side, initial encounter for closed fracture: Secondary | ICD-10-CM | POA: Diagnosis not present

## 2021-02-12 DIAGNOSIS — R079 Chest pain, unspecified: Secondary | ICD-10-CM

## 2021-02-12 LAB — CBC
HCT: 45.3 % (ref 39.0–52.0)
Hemoglobin: 14.9 g/dL (ref 13.0–17.0)
MCH: 27.9 pg (ref 26.0–34.0)
MCHC: 32.9 g/dL (ref 30.0–36.0)
MCV: 84.8 fL (ref 80.0–100.0)
Platelets: 326 10*3/uL (ref 150–400)
RBC: 5.34 MIL/uL (ref 4.22–5.81)
RDW: 15.3 % (ref 11.5–15.5)
WBC: 8.8 10*3/uL (ref 4.0–10.5)
nRBC: 0 % (ref 0.0–0.2)

## 2021-02-12 LAB — BASIC METABOLIC PANEL
Anion gap: 8 (ref 5–15)
BUN: 9 mg/dL (ref 8–23)
CO2: 26 mmol/L (ref 22–32)
Calcium: 9.6 mg/dL (ref 8.9–10.3)
Chloride: 97 mmol/L — ABNORMAL LOW (ref 98–111)
Creatinine, Ser: 0.66 mg/dL (ref 0.61–1.24)
GFR, Estimated: >60 mL/min (ref >60)
Glucose, Bld: 153 mg/dL — ABNORMAL HIGH (ref 70–99)
Potassium: 4 mmol/L (ref 3.5–5.1)
Sodium: 131 mmol/L — ABNORMAL LOW (ref 135–145)

## 2021-02-12 LAB — TROPONIN I (HIGH SENSITIVITY)
Troponin I (High Sensitivity): 2 ng/L (ref <18)
Troponin I (High Sensitivity): 3 ng/L (ref <18)

## 2021-02-12 NOTE — ED Provider Notes (Signed)
[facility] Provider Note   CSN: [id number] Arrival date & time: 02/12/21  1104     History  Chief Complaint  Patient presents with   Chest Pain    Logan Alvarez is a 66 y.o. male.  Patient states he had some chest pressure yesterday and today.  Patient has a history of coronary artery disease and liver cancer.  He had an NSTEMI in November where he had some chest pressure  The history is provided by the patient and medical records. No language interpreter was used.  Chest Pain Pain location:  L chest Pain quality: aching   Pain radiates to:  Does not radiate Pain severity:  Moderate Onset quality:  Sudden Timing:  Intermittent Progression:  Waxing and waning Chronicity:  New Associated symptoms: no abdominal pain, no back pain, no cough, no fatigue and no headache       Home Medications Prior to Admission medications   Medication Sig Start Date End Date Taking? Authorizing Provider  ALPRAZolam Duanne Moron) 0.5 MG tablet Take one tablet po QID 02/10/21   Luking, Elayne Snare, MD  amoxicillin (AMOXIL) 875 MG tablet Take 875 mg by mouth 2 (two) times daily. 01/29/21   [provider]  aspirin EC 81 MG tablet Take 1 tablet (81 mg total) by mouth daily. Swallow whole. 12/04/20   Kristopher Oppenheim, DO  dextromethorphan-guaiFENesin (ROBITUSSIN-DM) 10-100 MG/5ML liquid Take 5-10 mLs by mouth every 6 (six) hours as needed for cough.    [provider]  losartan (COZAAR) 25 MG tablet Take 25 mg by mouth in the morning. 01/24/21   [provider]  losartan (COZAAR) 50 MG tablet Take 0.5 tablets (25 mg total) by mouth daily. Patient not taking: Reported on 02/04/2021 12/13/20   Erma Heritage, PA-C  metoprolol tartrate (LOPRESSOR) 25 MG tablet Take 0.5 tablets (12.5 mg total) by mouth 2 (two) times daily. 12/09/20 02/05/23  Kathyrn Drown, MD  nitroGLYCERIN (NITROSTAT) 0.4 MG SL tablet Place 1 tablet (0.4 mg total) under the tongue every 5 (five) minutes  as needed for chest pain. 12/13/20   Strader, Fransisco Hertz, PA-C  oxyCODONE (OXY IR/ROXICODONE) 5 MG immediate release tablet Take 5 mg by mouth every 6 (six) hours as needed (pain).    [provider]  pregabalin (LYRICA) 75 MG capsule TAKE 1 CAPSULE BY MOUTH 3 TIMES DAILY. 01/30/21   Kathyrn Drown, MD  sodium chloride (OCEAN) 0.65 % nasal spray 1 spray as needed for congestion.    [provider]  ticagrelor (BRILINTA) 90 MG TABS tablet Take 1 tablet (90 mg total) by mouth 2 (two) times daily. 02/06/21 02/01/22  Josue Hector, MD  Vitamin D, Ergocalciferol, (DRISDOL) 1.25 MG (50000 UNIT) CAPS capsule TAKE 1 CAPSULE BY MOUTH WEEKLY Patient taking differently: Take 50,000 Units by mouth every Monday. TAKE 1 CAPSULE BY MOUTH WEEKLY 01/31/21   Kathyrn Drown, MD      Allergies    Cymbalta [duloxetine hcl], Lodine [etodolac], and Zoloft [sertraline hcl]    Review of Systems   Review of Systems  Constitutional:  Negative for appetite change and fatigue.  HENT:  Negative for congestion, ear discharge and sinus pressure.   Eyes:  Negative for discharge.  Respiratory:  Negative for cough.   Cardiovascular:  Positive for chest pain.  Gastrointestinal:  Negative for abdominal pain and diarrhea.  Genitourinary:  Negative for frequency and hematuria.  Musculoskeletal:  Negative for back pain.  Skin:  Negative for  rash.  Neurological:  Negative for seizures and headaches.  Psychiatric/Behavioral:  Negative for hallucinations.    Physical Exam Updated Vital Signs BP 125/84    Pulse 84    Temp 97.9 F (36.6 C) (Oral)    Resp 13    Ht 5\' 8"  (1.727 m)    Wt 88 kg    SpO2 97%    BMI 29.50 kg/m  Physical Exam Vitals reviewed.  Constitutional:      Appearance: He is well-developed.  HENT:     Head: Normocephalic.     Nose: Nose normal.  Eyes:     General: No scleral icterus.    Conjunctiva/sclera: Conjunctivae normal.  Neck:     Thyroid: No thyromegaly.  Cardiovascular:      Rate and Rhythm: Normal rate and regular rhythm.     Heart sounds: No murmur heard.   No friction rub. No gallop.  Pulmonary:     Breath sounds: No stridor. No wheezing or rales.  Chest:     Chest wall: No tenderness.  Abdominal:     General: There is no distension.     Tenderness: There is no abdominal tenderness. There is no rebound.  Musculoskeletal:        General: Normal range of motion.     Cervical back: Neck supple.  Lymphadenopathy:     Cervical: No cervical adenopathy.  Skin:    Findings: No erythema or rash.  Neurological:     Mental Status: He is alert and oriented to person, place, and time.     Motor: No abnormal muscle tone.     Coordination: Coordination normal.  Psychiatric:        Behavior: Behavior normal.    ED Results / Procedures / Treatments   Labs (all labs ordered are listed, but only abnormal results are displayed) Labs Reviewed  BASIC METABOLIC PANEL - Abnormal; Notable for the following components:      Result Value   Sodium 131 (*)    Chloride 97 (*)    Glucose, Bld 153 (*)    All other components within normal limits  CBC  TROPONIN I (HIGH SENSITIVITY)  TROPONIN I (HIGH SENSITIVITY)    EKG EKG Interpretation  Date/Time:  Wednesday February 12 2021 11:15:14 EST Ventricular Rate:  82 PR Interval:  166 QRS Duration: 108 QT Interval:  376 QTC Calculation: 440 R Axis:   79 Text Interpretation: Sinus rhythm Low voltage, precordial leads Baseline wander in lead(s) V5 Confirmed by Milton Ferguson [phone]) on 02/12/2021 11:30:19 AM  Radiology DG Chest 2 View  Result Date: 02/12/2021 CLINICAL DATA:  Chest pressure.  Recent COVID diagnosis. EXAM: CHEST - 2 VIEW COMPARISON:  12/05/2020 and CT chest 10/04/2020. FINDINGS: Trachea is midline. Heart size normal. Lungs are clear. No pleural fluid. Old right rib fractures. IMPRESSION: No acute findings. Electronically Signed   By: Lorin Picket M.D.   On: 02/12/2021 11:49    Procedures Procedures     Medications Ordered in ED Medications - No data to display  ED Course/ Medical Decision Making/ A&P                           Medical Decision Making  Labs EKG chest x-ray unremarkable.  Patient with chest pressure earlier today.  Cardiology will come talk to the patient but he will probably be discharged home Patient seen by cardiology and he will be discharged home    This patient presents  to the ED for concern of chest pain, this involves an extensive number of treatment options, and is a complaint that carries with it a high risk of complications and morbidity.  The differential diagnosis includes coronary disease, PE   Co morbidities that complicate the patient evaluation  Coronary artery disease and liver cancer   Additional history obtained:  Additional history obtained from patient External records from outside source obtained and reviewed including old record   Lab Tests:  I Ordered, and personally interpreted labs.  The pertinent results include: Elevated glucose at 150   Imaging Studies ordered:  I ordered imaging studies including chest x-ray I independently visualized and interpreted imaging which showed unremarkable I agree with the radiologist interpretation   Cardiac Monitoring:  The patient was maintained on a cardiac monitor.  I personally viewed and interpreted the cardiac monitored which showed an underlying rhythm of: Normal sinus rhythm   Medicines ordered and prescription drug management:  No medicines ordered Reevaluation of the patient after these medicines showed that the patient improved I have reviewed the patients home medicines and have made adjustments as needed   Test Considered:  CT chest   Critical Interventions: Cardiology consult   Consultations Obtained:  I requested consultation with the cardiology,  and discussed lab and imaging findings as well as pertinent plan - they recommend: Cardiology saw the patient and  decided discharge patient home for follow-up   Problem List / ED Course:  Coronary artery disease, liver cancer   Reevaluation:  After the interventions noted above, I reevaluated the patient and found that they have :improved   Social Determinants of Health:   None   Dispostion:  After consideration of the diagnostic results and the patients response to treatment, I feel that the patent would benefit from discharge home with cardiology follow-up.         Final Clinical Impression(s) / ED Diagnoses Final diagnoses:  None    Rx / DC Orders ED Discharge Orders     None         Milton Ferguson, MD 02/15/21 1028

## 2021-02-12 NOTE — Telephone Encounter (Signed)
-----   Message from Erma Heritage, [place] sent at 02/12/2021  5:07 PM EST ----- Regarding: Outpatient Stress Test and F/U Dr. Gardiner Rhyme saw this patient in the Emergency Department and recommended he have a Lexiscan Myoview for chest pain. He did recommend this be performed at the [facility] office if able given the scanner they have there. He has scheduled follow-up with Dr. Johnsie Cancel at the end of February but would see if this can be moved up with Dr. Johnsie Cancel earlier in the month as he does have availability. Would just make sure his stress test is scheduled before his appointment so it can be reviewed at his visit.   Thanks,  [place]

## 2021-02-12 NOTE — Telephone Encounter (Signed)
Patient called and said that he have been feeling uneasy in his heart and wants to know what to do

## 2021-02-12 NOTE — Discharge Instructions (Signed)
Follow-up as instructed by cardiology.  Return if any problems

## 2021-02-12 NOTE — Telephone Encounter (Signed)
Spoke with pt who agrees to be seen in ER for evaluation.

## 2021-02-12 NOTE — ED Triage Notes (Signed)
Pt presents to ED with complaints of mid chest pressure started yesterday evening, no other reported associated symptoms.

## 2021-02-12 NOTE — Consult Note (Addendum)
Cardiology Consultation:   Patient ID: DONTAVIUS KEIM MRN: [id number]; DOB: 07/27/1955  Admit date: 02/12/2021 Date of Consult: 02/12/2021  PCP:  Kathyrn Drown, MD   [facility] Providers Cardiologist:  Jenkins Rouge, MD        Patient Profile:   Logan Alvarez is a 66 y.o. male with a hx of CAD, stage IV hepatocellular carcinoma who is being seen 02/12/2021 for the evaluation of chest pain at the request of Dr. Roderic Palau.  History of Present Illness:   Logan Alvarez is a 66 year old male with the above medical history who presents with chest pain.  He he was admitted to [facility] on 12/02/2020 with chest pain and found to have NSTEMI with peak troponin 3068.  Echocardiogram on 12/03/2020 showed EF 55 to 93%, normal diastolic function, normal RV function, no significant valvular disease.  LHC on 12/03/2020 showed 25% mid LAD stenosis, 80% mid LCx status post DES, 90% distal Lcx.  Underwent DES to the mid LCx and medical management was recommended for distal disease.  Discharged on aspirin and Brilinta.  He reports that last night he was resting watching TV when he had onset of sharp pain on the left side of chest.  Pain was just last for few seconds and resolve.  However kept recurring over the next hour.  After about an hour, he states that he started having pressure on the left side of his chest.  Pressure lasted for 2 hours, prompting him to come to the ED.  Reports nitroglycerin made the pain worse.  He reports he walks regularly, no exertional chest pain.  In the ED, initial vital signs notable for BP 142/98, pulse 88, SPO2 98% on room air.  Labs notable for creatinine 0.7, sodium 131, potassium 4.0, troponin 2 > 3, hemoglobin 14.9, platelets 326.  EKG shows sinus rhythm, rate 82, no ST abnormalities.  He is currently chest pain-free.  Past Medical History:  Diagnosis Date   CAD (coronary artery disease)    a. 01/2013: cath showing nonobstructive disease. b. 11/2020: NSTEMI with DES to  mid-LCx and medical management recommended of distal LCx disease given small vessel size   Hepatocellular carcinoma ([place])    Stage IV   Hyperlipidemia    Hypertension    Testosterone deficiency 2009    Past Surgical History:  Procedure Laterality Date   BALLOON DILATION  07/30/2020   Procedure: BALLOON DILATION;  Surgeon: Eloise Harman, DO;  [facility];  Service: Endoscopy;;   COLONOSCOPY N/A 03/12/2017   Procedure: COLONOSCOPY;  Surgeon: Danie Binder, MD;  [facility];  Service: Endoscopy;  Laterality: N/A;  2:00   COLONOSCOPY WITH PROPOFOL N/A 06/18/2020   Procedure: COLONOSCOPY WITH PROPOFOL;  Surgeon: Eloise Harman, DO;  [facility];  Service: Endoscopy;  Laterality: N/A;  pm appt   CORONARY STENT INTERVENTION N/A 12/03/2020   Procedure: CORONARY STENT INTERVENTION;  Surgeon: Jettie Booze, MD;  Location: [place] [facility] CV LAB;  Service: Cardiovascular;  Laterality: N/A;   ESOPHAGOGASTRODUODENOSCOPY (EGD) WITH PROPOFOL N/A 07/30/2020   Procedure: ESOPHAGOGASTRODUODENOSCOPY (EGD) WITH PROPOFOL;  Surgeon: Eloise Harman, DO;  [facility];  Service: Endoscopy;  Laterality: N/A;  11:45am   LEFT HEART CATH AND CORONARY ANGIOGRAPHY N/A 12/03/2020   Procedure: LEFT HEART CATH AND CORONARY ANGIOGRAPHY;  Surgeon: Jettie Booze, MD;  Location: [facility] [facility];  Service: Cardiovascular;  Laterality: N/A;   LEFT HEART CATHETERIZATION WITH CORONARY ANGIOGRAM  N/A 02/01/2013   Procedure: LEFT HEART CATHETERIZATION WITH CORONARY ANGIOGRAM;  Surgeon: Josue Hector, MD;  Location: [facility] CATH LAB;  Service: Cardiovascular;  Laterality: N/A;   NECK SURGERY     POLYPECTOMY  06/18/2020   Procedure: POLYPECTOMY;  Surgeon: Eloise Harman, DO;  [facility];  Service: Endoscopy;;  snare and biospy      Inpatient Medications: Scheduled Meds:  Continuous Infusions:  PRN Meds:   Allergies:    Allergies  Allergen  Reactions   Cymbalta [Duloxetine Hcl]     Bad dreams   Lodine [Etodolac] Nausea And Vomiting   Zoloft [Sertraline Hcl]     REDUCED URINARY FLOW,NOCTURIA    Social History:   Social History   Socioeconomic History   Marital status: Widowed    Spouse name: Not on file   Number of children: Not on file   Years of education: Not on file   Highest education level: Not on file  Occupational History   Not on file  Tobacco Use   Smoking status: Never   Smokeless tobacco: Never  Vaping Use   Vaping Use: Never used  Substance and Sexual Activity   Alcohol use: Not Currently    Comment: 4-6 beers a day   Drug use: No   Sexual activity: Not on file  Other Topics Concern   Not on file  Social History Narrative   Not on file   Social Determinants of Health   Financial Resource Strain: Not on file  Food Insecurity: Not on file  Transportation Needs: Not on file  Physical Activity: Not on file  Stress: Not on file  Social Connections: Not on file  Intimate Partner Violence: Not on file    Family History:    Family History  Problem Relation Age of Onset   Heart attack Father        50's     ROS:  Please see the history of present illness.   All other ROS reviewed and negative.     Physical Exam/Data:   Vitals:   02/12/21 1315 02/12/21 1330 02/12/21 1400 02/12/21 1430  BP:  91/64 119/83 125/84  Pulse: 82 76 80 84  Resp: (!) 21 13 15 13   Temp:      TempSrc:      SpO2: 99% 99% 98% 97%  Weight:      Height:       No intake or output data in the 24 hours ending 02/12/21 1534 Last 3 Weights 02/12/2021 01/29/2021 01/21/2021  Weight (lbs) 194 lb 201 lb 6.4 oz 206 lb  Weight (kg) 87.998 kg 91.354 kg 93.441 kg     Body mass index is 29.5 kg/m.  General:  Well nourished, well developed, in no acute distress HEENT: normal Neck: no JVD Vascular: No carotid bruits; Distal pulses 2+ bilaterally Cardiac:  normal S1, S2; RRR; no murmur  Lungs:  clear to auscultation  bilaterally, no wheezing, rhonchi or rales  Abd: soft, nontender, no hepatomegaly  Ext: no edema Musculoskeletal:  No deformities, BUE and BLE strength normal and equal Skin: warm and dry  Neuro:  CNs 2-12 intact, no focal abnormalities noted Psych:  Normal affect   EKG:  The EKG was personally reviewed and demonstrates:  EKG shows sinus rhythm, rate 82, no ST abnormalities. Telemetry:  Telemetry was personally reviewed and demonstrates: Normal sinus rhythm, rate 70s to 80s  Relevant CV Studies:   Laboratory Data:  High Sensitivity Troponin:   Recent Labs  Lab 02/12/21 1121 02/12/21 1322  TROPONINIHS 2 3     Chemistry Recent Labs  Lab 02/12/21 1121  NA 131*  K 4.0  CL 97*  CO2 26  GLUCOSE 153*  BUN 9  CREATININE 0.66  CALCIUM 9.6  GFRNONAA >60  ANIONGAP 8    No results for input(s): PROT, ALBUMIN, AST, ALT, ALKPHOS, BILITOT in the last 168 hours. Lipids No results for input(s): CHOL, TRIG, HDL, LABVLDL, LDLCALC, CHOLHDL in the last 168 hours.  Hematology Recent Labs  Lab 02/12/21 1121  WBC 8.8  RBC 5.34  HGB 14.9  HCT 45.3  MCV 84.8  MCH 27.9  MCHC 32.9  RDW 15.3  PLT 326   Thyroid No results for input(s): TSH, FREET4 in the last 168 hours.  BNPNo results for input(s): BNP, PROBNP in the last 168 hours.  DDimer No results for input(s): DDIMER in the last 168 hours.   Radiology/Studies:  DG Chest 2 View  Result Date: 02/12/2021 CLINICAL DATA:  Chest pressure.  Recent COVID diagnosis. EXAM: CHEST - 2 VIEW COMPARISON:  12/05/2020 and CT chest 10/04/2020. FINDINGS: Trachea is midline. Heart size normal. Lungs are clear. No pleural fluid. Old right rib fractures. IMPRESSION: No acute findings. Electronically Signed   By: Lorin Picket M.D.   On: 02/12/2021 11:49     Assessment and Plan:   CAD: Admitted with NSTEMI 11/2020 status post DES to mid LCx, medical management recommended for distal LCx disease.  Presents with chest pain.  EKG unremarkable.   Given continuous chest pain x2 hours with negative troponins, suspect noncardiac chest pain.  Given his history, will plan Lexiscan Myoview, can be done as outpatient.  Continue aspirin, ticagrelor, metoprolol.  Shared Decision Making/Informed Consent The risks [chest pain, shortness of breath, cardiac arrhythmias, dizziness, blood pressure fluctuations, myocardial infarction, stroke/transient ischemic attack, nausea, vomiting, allergic reaction, radiation exposure, metallic taste sensation and life-threatening complications (estimated to be 1 in 10,000)], benefits (risk stratification, diagnosing coronary artery disease, treatment guidance) and alternatives of a nuclear stress test were discussed in detail with Logan Alvarez and he agrees to proceed.    For questions or updates, please contact [facility] Please consult [email or web address] for contact info under    Signed, Donato Heinz, MD  02/12/2021 3:34 PM

## 2021-02-12 NOTE — Telephone Encounter (Signed)
Pt state that starting yesterday he has been having chest pressure/sore. Pt states that he has been coughing. He reports that the pain is in his chest and moves to his neck. Pt denies nausea, SOB. Pt rates pain 3/10. Pt states that he took a nitro on last night and states that it made him feel worse. States that it made the chest pressure worse. Please advise.

## 2021-02-12 NOTE — Telephone Encounter (Signed)
Order placed

## 2021-02-17 ENCOUNTER — Ambulatory Visit: Payer: BC Managed Care – PPO

## 2021-02-17 ENCOUNTER — Ambulatory Visit: Payer: BC Managed Care – PPO | Admitting: Hematology and Oncology

## 2021-02-17 ENCOUNTER — Other Ambulatory Visit: Payer: BC Managed Care – PPO

## 2021-02-17 ENCOUNTER — Encounter (HOSPITAL_COMMUNITY): Admission: RE | Admit: 2021-02-17 | Payer: BC Managed Care – PPO | Source: Ambulatory Visit

## 2021-02-19 ENCOUNTER — Ambulatory Visit: Payer: BC Managed Care – PPO | Admitting: Family Medicine

## 2021-02-20 ENCOUNTER — Other Ambulatory Visit (HOSPITAL_COMMUNITY): Payer: Self-pay | Admitting: *Deleted

## 2021-02-20 ENCOUNTER — Inpatient Hospital Stay (HOSPITAL_COMMUNITY): Payer: Medicare Other

## 2021-02-20 ENCOUNTER — Inpatient Hospital Stay (HOSPITAL_COMMUNITY): Payer: Medicare Other | Attending: Hematology

## 2021-02-20 ENCOUNTER — Other Ambulatory Visit: Payer: Self-pay

## 2021-02-20 ENCOUNTER — Inpatient Hospital Stay (HOSPITAL_BASED_OUTPATIENT_CLINIC_OR_DEPARTMENT_OTHER): Payer: Medicare Other | Admitting: Hematology

## 2021-02-20 VITALS — BP 125/76 | HR 85 | Temp 98.4°F | Resp 18

## 2021-02-20 VITALS — BP 134/80 | HR 77 | Temp 98.4°F | Resp 18 | Ht 68.0 in | Wt 199.4 lb

## 2021-02-20 DIAGNOSIS — C220 Liver cell carcinoma: Secondary | ICD-10-CM

## 2021-02-20 DIAGNOSIS — Z5112 Encounter for antineoplastic immunotherapy: Secondary | ICD-10-CM | POA: Diagnosis not present

## 2021-02-20 DIAGNOSIS — Z5111 Encounter for antineoplastic chemotherapy: Secondary | ICD-10-CM | POA: Diagnosis not present

## 2021-02-20 DIAGNOSIS — R970 Elevated carcinoembryonic antigen [CEA]: Secondary | ICD-10-CM | POA: Insufficient documentation

## 2021-02-20 DIAGNOSIS — C786 Secondary malignant neoplasm of retroperitoneum and peritoneum: Secondary | ICD-10-CM | POA: Insufficient documentation

## 2021-02-20 DIAGNOSIS — R978 Other abnormal tumor markers: Secondary | ICD-10-CM | POA: Diagnosis not present

## 2021-02-20 LAB — COMPREHENSIVE METABOLIC PANEL
ALT: 121 U/L — ABNORMAL HIGH (ref 0–44)
AST: 93 U/L — ABNORMAL HIGH (ref 15–41)
Albumin: 3.7 g/dL (ref 3.5–5.0)
Alkaline Phosphatase: 103 U/L (ref 38–126)
Anion gap: 9 (ref 5–15)
BUN: 9 mg/dL (ref 8–23)
CO2: 26 mmol/L (ref 22–32)
Calcium: 9.6 mg/dL (ref 8.9–10.3)
Chloride: 98 mmol/L (ref 98–111)
Creatinine, Ser: 0.75 mg/dL (ref 0.61–1.24)
GFR, Estimated: >60 mL/min (ref >60)
Glucose, Bld: 120 mg/dL — ABNORMAL HIGH (ref 70–99)
Potassium: 4 mmol/L (ref 3.5–5.1)
Sodium: 133 mmol/L — ABNORMAL LOW (ref 135–145)
Total Bilirubin: 1.2 mg/dL (ref 0.3–1.2)
Total Protein: 8.1 g/dL (ref 6.5–8.1)

## 2021-02-20 LAB — CBC WITH DIFFERENTIAL/PLATELET
Abs Immature Granulocytes: 0.04 10*3/uL (ref 0.00–0.07)
Basophils Absolute: 0.1 10*3/uL (ref 0.0–0.1)
Basophils Relative: 1 %
Eosinophils Absolute: 0.2 10*3/uL (ref 0.0–0.5)
Eosinophils Relative: 2 %
HCT: 45.1 % (ref 39.0–52.0)
Hemoglobin: 15 g/dL (ref 13.0–17.0)
Immature Granulocytes: 0 %
Lymphocytes Relative: 10 %
Lymphs Abs: 0.9 10*3/uL (ref 0.7–4.0)
MCH: 29 pg (ref 26.0–34.0)
MCHC: 33.3 g/dL (ref 30.0–36.0)
MCV: 87.1 fL (ref 80.0–100.0)
Monocytes Absolute: 1.2 10*3/uL — ABNORMAL HIGH (ref 0.1–1.0)
Monocytes Relative: 12 %
Neutro Abs: 7.4 10*3/uL (ref 1.7–7.7)
Neutrophils Relative %: 75 %
Platelets: 345 10*3/uL (ref 150–400)
RBC: 5.18 MIL/uL (ref 4.22–5.81)
RDW: 15.8 % — ABNORMAL HIGH (ref 11.5–15.5)
WBC: 9.8 10*3/uL (ref 4.0–10.5)
nRBC: 0 % (ref 0.0–0.2)

## 2021-02-20 LAB — LACTATE DEHYDROGENASE: LDH: 145 U/L (ref 98–192)

## 2021-02-20 LAB — MAGNESIUM: Magnesium: 2 mg/dL (ref 1.7–2.4)

## 2021-02-20 MED ORDER — SODIUM CHLORIDE 0.9 % IV SOLN: Freq: Once | INTRAVENOUS | Status: AC

## 2021-02-20 MED ORDER — PREGABALIN 100 MG PO CAPS: 100.0000 mg | ORAL_CAPSULE | Freq: Three times a day (TID) | ORAL | 3 refills | Status: DC

## 2021-02-20 MED ORDER — SODIUM CHLORIDE 0.9 % IV SOLN
1200.0000 mg | Freq: Once | INTRAVENOUS | Status: AC
  Administered 2021-02-20: 1200 mg via INTRAVENOUS
  Filled 2021-02-20: qty 20

## 2021-02-20 MED ORDER — SODIUM CHLORIDE 0.9 % IV SOLN
15.0000 mg/kg | Freq: Once | INTRAVENOUS | Status: AC
  Administered 2021-02-20: 1300 mg via INTRAVENOUS
  Filled 2021-02-20: qty 48

## 2021-02-20 NOTE — Progress Notes (Signed)
Patient has been examined by Dr. Katragadda, and vital signs and labs have been reviewed. ANC, Creatinine, LFTs, hemoglobin, and platelets are within treatment parameters per M.D. - pt may proceed with treatment.    °

## 2021-02-20 NOTE — Progress Notes (Addendum)
Pt presents today for MVASI/Tecentriq per provider's order. Vital signs and other labs WNL for treatment. Pt's ALT is 121 and AST is 93. Okay to proceed with treatment per Dr.K. Pt voiced no new complaints at this time.  Peripheral IV started with good blood return pre and post infusion.  MVASI/Tecentriq given today per MD orders. Tolerated infusion without adverse affects. Vital signs stable. No complaints at this time. Discharged from clinic ambulatory in stable condition. Alert and oriented x 3. F/U with [facility] as scheduled.

## 2021-02-20 NOTE — Patient Instructions (Signed)
[facility]  Discharge Instructions: Thank you for choosing [facility] to provide your oncology and hematology care.  If you have a lab appointment with the [facility], please come in thru the Main Entrance and check in at the main information desk.  Wear comfortable clothing and clothing appropriate for easy access to any Portacath or PICC line.   We strive to give you quality time with your provider. You may need to reschedule your appointment if you arrive late (15 or more minutes).  Arriving late affects you and other patients whose appointments are after yours.  Also, if you miss three or more appointments without notifying the office, you may be dismissed from the clinic at the providers discretion.      For prescription refill requests, have your pharmacy contact our office and allow 72 hours for refills to be completed.    Today you received the following chemotherapy and/or immunotherapy agents MVASI and Tecentriq   To help prevent nausea and vomiting after your treatment, we encourage you to take your nausea medication as directed.  BELOW ARE SYMPTOMS THAT SHOULD BE REPORTED IMMEDIATELY: *FEVER GREATER THAN 100.4 F (38 C) OR HIGHER *CHILLS OR SWEATING *NAUSEA AND VOMITING THAT IS NOT CONTROLLED WITH YOUR NAUSEA MEDICATION *UNUSUAL SHORTNESS OF BREATH *UNUSUAL BRUISING OR BLEEDING *URINARY PROBLEMS (pain or burning when urinating, or frequent urination) *BOWEL PROBLEMS (unusual diarrhea, constipation, pain near the anus) TENDERNESS IN MOUTH AND THROAT WITH OR WITHOUT PRESENCE OF ULCERS (sore throat, sores in mouth, or a toothache) UNUSUAL RASH, SWELLING OR PAIN  UNUSUAL VAGINAL DISCHARGE OR ITCHING   Items with * indicate a potential emergency and should be followed up as soon as possible or go to the Emergency Department if any problems should occur.  Please show the CHEMOTHERAPY ALERT CARD or IMMUNOTHERAPY ALERT CARD at check-in to the Emergency  Department and triage nurse.  Should you have questions after your visit or need to cancel or reschedule your appointment, please contact [facility] [phone]  and follow the prompts.  Office hours are 8:00 a.m. to 4:30 p.m. Monday - Friday. Please note that voicemails left after 4:00 p.m. may not be returned until the following business day.  We are closed weekends and major holidays. You have access to a nurse at all times for urgent questions. Please call the main number to the clinic [phone] and follow the prompts.  For any non-urgent questions, you may also contact your provider using MyChart. We now offer e-Visits for anyone 39 and older to request care online for non-urgent symptoms. For details visit [email or web address].   Also download the MyChart app! Go to the app store, search "MyChart", open the app, select [facility], and log in with your MyChart username and password.  Due to Covid, a mask is required upon entering the hospital/clinic. If you do not have a mask, one will be given to you upon arrival. For doctor visits, patients may have 1 support person aged 55 or older with them. For treatment visits, patients cannot have anyone with them due to current Covid guidelines and our immunocompromised population.

## 2021-02-20 NOTE — Patient Instructions (Signed)
[place] at [facility] Discharge Instructions   You were seen and examined today by Dr. Delton Coombes.  He reviewed your lab work results, which are normal/stable.   We will proceed with your treatment today.   Return as scheduled for lab work, office visit, and treatment.    Thank you for choosing [place] at [facility] to provide your oncology and hematology care.  To afford each patient quality time with our provider, please arrive at least 15 minutes before your scheduled appointment time.   If you have a lab appointment with the [place] please come in thru the Main Entrance and check in at the main information desk.  You need to re-schedule your appointment should you arrive 10 or more minutes late.  We strive to give you quality time with our providers, and arriving late affects you and other patients whose appointments are after yours.  Also, if you no show three or more times for appointments you may be dismissed from the clinic at the providers discretion.     Again, thank you for choosing [facility].  Our hope is that these requests will decrease the amount of time that you wait before being seen by our physicians.       _____________________________________________________________  Should you have questions after your visit to [facility], please contact our office at [phone] and follow the prompts.  Our office hours are 8:00 a.m. and 4:30 p.m. Monday - Friday.  Please note that voicemails left after 4:00 p.m. may not be returned until the following business day.  We are closed weekends and major holidays.  You do have access to a nurse 24-7, just call the main number to the clinic [phone] and do not press any options, hold on the line and a nurse will answer the phone.    For prescription refill requests, have your pharmacy contact our office and allow 72 hours.    Due to Covid, you will  need to wear a mask upon entering the hospital. If you do not have a mask, a mask will be given to you at the Main Entrance upon arrival. For doctor visits, patients may have 1 support person age 6 or older with them. For treatment visits, patients can not have anyone with them due to social distancing guidelines and our immunocompromised population.

## 2021-02-20 NOTE — Progress Notes (Signed)
Logan Alvarez, [place] [address]   CLINIC:  Medical Oncology/Hematology  PCP:  Kathyrn Drown, MD [address] [place] / [place] [address] [phone]   REASON FOR VISIT:  Follow-up for hepatocellular carcinoma  PRIOR THERAPY: none  NGS Results: not done  CURRENT THERAPY: Atezolizumab + Bevacizumab q21d Maintenance  BRIEF ONCOLOGIC HISTORY:  Oncology History  Hepatocellular carcinoma ([place])  11/12/2017 Initial Diagnosis   Hepatocellular carcinoma ([place])   07/29/2020 Cancer Staging   Staging form: Liver, AJCC 8th Edition - Clinical stage from 07/29/2020: Stage IVB (cT1a, cN0, pM1) - Signed by Orson Slick, MD on 07/29/2020 Stage prefix: Initial diagnosis Histologic grade (G): G3 Histologic grading system: 4 grade system    08/15/2020 -  Chemotherapy   Patient is on Treatment Plan : [place] Atezolizumab + Bevacizumab q21d Maintenance       CANCER STAGING:  Cancer Staging  Hepatocellular carcinoma ([place]) Staging form: Liver, AJCC 8th Edition - Clinical stage from 07/29/2020: Stage IVB (cT1a, cN0, pM1) - Signed by Orson Slick, MD on 07/29/2020   INTERVAL HISTORY:  [name], a 66 y.o. male, returns for routine follow-up and consideration for next cycle of chemotherapy. Logan Alvarez was last seen on 02/21/2020.  Due for cycle #9 of Atezolizumab + Bevacizumab today.   Overall, he tells me he has been feeling pretty well. He reports pain in his arms and shoulders; Lyrica has provided less relief for this pain than it has provided previously. He reports pain with each BM and occasional light rectal bleeding. He denies abdominal pain. He reports constipation and hard stool. He is drinking 64 ounces of water daily. He denies current bleeding. He reports 1 week ago he had 3 beers. His appetite is poor due to soreness in his mouth.   Overall, he feels ready for next cycle of chemo today.    REVIEW OF SYSTEMS:  Review of Systems   Constitutional:  Positive for appetite change. Negative for fatigue.  HENT:   Positive for mouth sores (6/10). Negative for nosebleeds.   Respiratory:  Positive for cough. Negative for hemoptysis.   Gastrointestinal:  Positive for blood in stool (occasional) and constipation. Negative for abdominal pain.  Genitourinary:  Negative for hematuria.   Musculoskeletal:  Positive for arthralgias (6/10) and myalgias (arms).  Hematological:  Does not bruise/bleed easily.  Psychiatric/Behavioral:  Positive for depression. The patient is nervous/anxious.   All other systems reviewed and are negative.  PAST MEDICAL/SURGICAL HISTORY:  Past Medical History:  Diagnosis Date   CAD (coronary artery disease)    a. 01/2013: cath showing nonobstructive disease. b. 11/2020: NSTEMI with DES to mid-LCx and medical management recommended of distal LCx disease given small vessel size   Hepatocellular carcinoma ([place])    Stage IV   Hyperlipidemia    Hypertension    Testosterone deficiency 2009   Past Surgical History:  Procedure Laterality Date   BALLOON DILATION  07/30/2020   Procedure: BALLOON DILATION;  Surgeon: Eloise Harman, DO;  [facility];  Service: Endoscopy;;   COLONOSCOPY N/A 03/12/2017   Procedure: COLONOSCOPY;  Surgeon: Danie Binder, MD;  [facility];  Service: Endoscopy;  Laterality: N/A;  2:00   COLONOSCOPY WITH PROPOFOL N/A 06/18/2020   Procedure: COLONOSCOPY WITH PROPOFOL;  Surgeon: Eloise Harman, DO;  [facility];  Service: Endoscopy;  Laterality: N/A;  pm appt   CORONARY STENT INTERVENTION N/A 12/03/2020  Procedure: CORONARY STENT INTERVENTION;  Surgeon: Jettie Booze, MD;  Location: [place] CV LAB;  Service: Cardiovascular;  Laterality: N/A;   ESOPHAGOGASTRODUODENOSCOPY (EGD) WITH PROPOFOL N/A 07/30/2020   Procedure: ESOPHAGOGASTRODUODENOSCOPY (EGD) WITH PROPOFOL;  Surgeon: Eloise Harman, DO;  [facility];  Service:  Endoscopy;  Laterality: N/A;  11:45am   LEFT HEART CATH AND CORONARY ANGIOGRAPHY N/A 12/03/2020   Procedure: LEFT HEART CATH AND CORONARY ANGIOGRAPHY;  Surgeon: Jettie Booze, MD;  Location: [place] [facility];  Service: Cardiovascular;  Laterality: N/A;   LEFT HEART CATHETERIZATION WITH CORONARY ANGIOGRAM N/A 02/01/2013   Procedure: LEFT HEART CATHETERIZATION WITH CORONARY ANGIOGRAM;  Surgeon: Josue Hector, MD;  [facility];  Service: Cardiovascular;  Laterality: N/A;   NECK SURGERY     POLYPECTOMY  06/18/2020   Procedure: POLYPECTOMY;  Surgeon: Eloise Harman, DO;  [facility];  Service: Endoscopy;;  snare and biospy    SOCIAL HISTORY:  Social History   Socioeconomic History   Marital status: Widowed    Spouse name: Not on file   Number of children: Not on file   Years of education: Not on file   Highest education level: Not on file  Occupational History   Not on file  Tobacco Use   Smoking status: Never   Smokeless tobacco: Never  Vaping Use   Vaping Use: Never used  Substance and Sexual Activity   Alcohol use: Not Currently    Comment: 4-6 beers a day   Drug use: No   Sexual activity: Not on file  Other Topics Concern   Not on file  Social History Narrative   Not on file   Social Determinants of Health   Financial Resource Strain: Not on file  Food Insecurity: Not on file  Transportation Needs: Not on file  Physical Activity: Not on file  Stress: Not on file  Social Connections: Not on file  Intimate Partner Violence: Not on file    FAMILY HISTORY:  Family History  Problem Relation Age of Onset   Heart attack Father        29's    CURRENT MEDICATIONS:  Current Outpatient Medications  Medication Sig Dispense Refill   ALPRAZolam (XANAX) 0.5 MG tablet Take one tablet po QID 120 tablet 4   aspirin EC 81 MG tablet Take 1 tablet (81 mg total) by mouth daily. Swallow whole. 30 tablet 11   losartan (COZAAR) 25 MG tablet Take 25 mg  by mouth in the morning.     losartan (COZAAR) 50 MG tablet Take 0.5 tablets (25 mg total) by mouth daily. 90 tablet 1   metoprolol tartrate (LOPRESSOR) 25 MG tablet Take 0.5 tablets (12.5 mg total) by mouth 2 (two) times daily. 30 tablet 5   sodium chloride (OCEAN) 0.65 % nasal spray 1 spray as needed for congestion.     ticagrelor (BRILINTA) 90 MG TABS tablet Take 1 tablet (90 mg total) by mouth 2 (two) times daily. 14 tablet 0   Vitamin D, Ergocalciferol, (DRISDOL) 1.25 MG (50000 UNIT) CAPS capsule TAKE 1 CAPSULE BY MOUTH WEEKLY (Patient taking differently: Take 50,000 Units by mouth every Monday. TAKE 1 CAPSULE BY MOUTH WEEKLY) 4 capsule 2   dextromethorphan-guaiFENesin (ROBITUSSIN-DM) 10-100 MG/5ML liquid Take 5-10 mLs by mouth every 6 (six) hours as needed for cough. (Patient not taking: Reported on 02/20/2021)     nitroGLYCERIN (NITROSTAT) 0.4 MG SL tablet Place 1 tablet (0.4 mg total) under the tongue  every 5 (five) minutes as needed for chest pain. (Patient not taking: Reported on 02/20/2021) 25 tablet 2   oxyCODONE (OXY IR/ROXICODONE) 5 MG immediate release tablet Take 5 mg by mouth every 6 (six) hours as needed (pain). (Patient not taking: Reported on 02/20/2021)     No current facility-administered medications for this visit.    ALLERGIES:  Allergies  Allergen Reactions   Cymbalta [Duloxetine Hcl]     Bad dreams   Lodine [Etodolac] Nausea And Vomiting   Zoloft [Sertraline Hcl]     REDUCED URINARY FLOW,NOCTURIA    PHYSICAL EXAM:  Performance status (ECOG): 1 - Symptomatic but completely ambulatory  Vitals:   02/20/21 0953  BP: 134/80  Pulse: 77  Resp: 18  Temp: 98.4 F (36.9 C)  SpO2: 97%   Wt Readings from Last 3 Encounters:  02/20/21 199 lb 6.4 oz (90.4 kg)  02/12/21 194 lb (88 kg)  01/29/21 201 lb 6.4 oz (91.4 kg)   Physical Exam Vitals reviewed.  Constitutional:      Appearance: Normal appearance.  Cardiovascular:     Rate and Rhythm: Normal rate and regular  rhythm.     Pulses: Normal pulses.     Heart sounds: Normal heart sounds.  Pulmonary:     Effort: Pulmonary effort is normal.     Breath sounds: Normal breath sounds.  Abdominal:     Palpations: Abdomen is soft. There is no hepatomegaly, splenomegaly or mass.     Tenderness: There is no abdominal tenderness.  Musculoskeletal:     Right lower leg: No edema.     Left lower leg: No edema.  Neurological:     General: No focal deficit present.     Mental Status: He is alert and oriented to person, place, and time.  Psychiatric:        Mood and Affect: Mood normal.        Behavior: Behavior normal.    LABORATORY DATA:  I have reviewed the labs as listed.  CBC Latest Ref Rng & Units 02/20/2021 02/12/2021 01/29/2021  WBC 4.0 - 10.5 K/uL 9.8 8.8 6.6  Hemoglobin 13.0 - 17.0 g/dL 15.0 14.9 15.1  Hematocrit 39.0 - 52.0 % 45.1 45.3 45.2  Platelets 150 - 400 K/uL 345 326 246   CMP Latest Ref Rng & Units 02/20/2021 02/12/2021 01/29/2021  Glucose 70 - 99 mg/dL 120(H) 153(H) 128(H)  BUN 8 - 23 mg/dL 9 9 6(L)  Creatinine 0.61 - 1.24 mg/dL 0.75 0.66 0.56(L)  Sodium 135 - 145 mmol/L 133(L) 131(L) 131(L)  Potassium 3.5 - 5.1 mmol/L 4.0 4.0 4.0  Chloride 98 - 111 mmol/L 98 97(L) 98  CO2 22 - 32 mmol/L 26 26 24   Calcium 8.9 - 10.3 mg/dL 9.6 9.6 9.5  Total Protein 6.5 - 8.1 g/dL 8.1 - 8.6(H)  Total Bilirubin 0.3 - 1.2 mg/dL 1.2 - 1.1  Alkaline Phos 38 - 126 U/L 103 - 237(H)  AST 15 - 41 U/L 93(H) - 37  ALT 0 - 44 U/L 121(H) - 61(H)    DIAGNOSTIC IMAGING:  I have independently reviewed the scans and discussed with the patient. DG Chest 2 View  Result Date: 02/12/2021 CLINICAL DATA:  Chest pressure.  Recent COVID diagnosis. EXAM: CHEST - 2 VIEW COMPARISON:  12/05/2020 and CT chest 10/04/2020. FINDINGS: Trachea is midline. Heart size normal. Lungs are clear. No pleural fluid. Old right rib fractures. IMPRESSION: No acute findings. Electronically Signed   By: Lorin Picket M.D.   On: 02/12/2021  11:49   CT Abdomen W Contrast  Result Date: 01/24/2021 CLINICAL DATA:  Elevated liver function tests, liver cancer with peritoneal metastatic disease. EXAM: CT ABDOMEN WITH CONTRAST TECHNIQUE: Multidetector CT imaging of the abdomen was performed using the standard protocol following bolus administration of intravenous contrast. CONTRAST:  13mL OMNIPAQUE IOHEXOL 350 MG/ML SOLN COMPARISON:  12/05/2020. FINDINGS: Lower chest: 3 mm posterior left lower lobe nodule unchanged. No pleural fluid. Heart size normal. Atherosclerotic calcification of the aorta and coronary arteries. No pericardial or pleural effusion. Distal esophagus is unremarkable. Distal periesophageal lymph nodes are not enlarged by CT size criteria. Hepatobiliary: Liver margin is irregular. No areas of abnormal arterial phase enhancement. Subcentimeter low-attenuation lesions in the liver are too small to characterize. Cholecystectomy. No biliary ductal dilatation. Pancreas: Decreased attenuation of the distal pancreatic body and tail, as seen on prior imaging. No ductal dilatation or gland atrophy. Spleen: Negative. Adrenals/Urinary Tract: Adrenal glands are unremarkable. 2.6 cm probable cyst in the lower pole right kidney. Subcentimeter low-attenuation lesion in the lower pole left kidney, too small to definitively characterize but statistically, a cyst is likely. Stomach/Bowel: Stomach and visualized portions of the small bowel and colon are unremarkable. Vascular/Lymphatic: Atherosclerotic calcification of the aorta. No pathologically enlarged lymph nodes. Other: Interval progression in hazy/nodular soft tissue thickening throughout the small bowel mesentery, omentum and paracolic gutters. No free fluid. Musculoskeletal: Similar L1 compression fraction. No worrisome lytic or sclerotic lesions. IMPRESSION: 1. Interval progression in peritoneal carcinomatosis. 2. Cirrhosis. Subcentimeter low-attenuation lesions in the liver are too small to  characterize. 3. Aortic atherosclerosis (ICD10-I70.0). Coronary artery calcification. Electronically Signed   By: Lorin Picket M.D.   On: 01/24/2021 13:21     ASSESSMENT:  Hepatocellular carcinoma with peritoneal carcinomatosis: - Liver segment 5/6 partial hepatectomy on 12/08/2017-poorly differentiated hepatocellular carcinoma, 3 cm.  T1BN X- margins. - CT pancreatic protocol on [facility]/25/[facility] at [facility] showed nodular studding and thickening of the ventral mesenteric fat suspicious for peritoneal carcinomatosis.  New pancreatic body lesion with pancreatic duct dilatation as well as right lateral abdominal wall deposit.  Found to have elevated CEA and CA 19-9. - Ultrasound-guided FNA of the right lateral abdominal wall soft tissue lesion on 06/26/2020. - Pathology consistent with metastatic HCC. - Evaluated by Dr. Lorenso Courier and started on Atezolizumab and bevacizumab on 08/15/2020. - Restaging scan on 12/05/2020 with persistent omental thickening with evidence of peritoneal metastatic disease, minimally changed since 10/04/2020.  Persistent superficial nodule along the right lateral abdomen. - We have reviewed CT of the abdomen from 01/23/2021. - There is interval progression in the hazy/nodular soft tissue thickening throughout the small bowel mesentery, omentum and paracolic gutters with no free fluid.  Cirrhosis, with subcentimeter low-attenuation lesions in the liver, too small to characterize.   Social/family history: - Lives at home.  Son lives with him.  He worked as an Pharmacist, hospital on disability currently.  Non-smoker.  Last drank alcohol last month. - Sister had bone cancer.   PLAN:  Metastatic HCC with peritoneal carcinomatosis: - He has tolerated last treatment very well. - He was evaluated in the ER for chest pain.  He has a stress test on 03/12/2021. - Reviewed labs today with minimal elevation of AST and ALT.  Bilirubin normal.  CBC was grossly normal. - Proceed with next  cycle today.  We have sent tumor markers today.  If they show significant elevation, will consider rescanning.  Otherwise I will scan him after next cycle.  2.  Constipation: -  He is taking Colace 4 tablets/day.  He reportedly has hemorrhoids and has pain with bowel movements.  He was told to use Preparation H.  3.  Joint pain/myalgias: - He has joint pains involving shoulders and sternum. - Lyrica 75 3 times daily is not helping. - We will increase Lyrica 200 mg 3 times daily. - If no improvement will consider low-dose prednisone.  4.  Transaminitis: - Transaminitis resolved after we held his Lipitor and Zetia. - Today AST is 93 and ALT is 121.  He reported that he had 3 beers 1 week ago.  We will closely monitor.   Orders placed this encounter:  No orders of the defined types were placed in this encounter.    Derek Jack, MD [place] [phone]   I, Thana Ates, am acting as a scribe for Dr. Derek Jack.  I, Derek Jack MD, have reviewed the above documentation for accuracy and completeness, and I agree with the above.

## 2021-02-21 LAB — CANCER ANTIGEN 19-9: CA 19-9: 13099 U/mL — ABNORMAL HIGH (ref 0–35)

## 2021-02-21 LAB — CEA: CEA: 69.2 ng/mL — ABNORMAL HIGH (ref 0.0–4.7)

## 2021-02-24 ENCOUNTER — Encounter (HOSPITAL_COMMUNITY): Payer: Self-pay

## 2021-02-24 ENCOUNTER — Other Ambulatory Visit (HOSPITAL_COMMUNITY): Payer: Self-pay | Admitting: *Deleted

## 2021-02-24 MED ORDER — CARAFATE 1 GM/10ML PO SUSP: 1.0000 g | ORAL | 2 refills | Status: DC | PRN

## 2021-02-24 NOTE — Progress Notes (Signed)
Patient reports sores in his mouth that extend to the back of his tongue.  Denies white patches.  States that mouth is painful and feels like there are bumps.  Carafate1g/17ml with lidocaine 2% 1:1 mixture will be called to [facility] on [address].  Advised him to let us know if this does not help with this.  Verbalized understanding.

## 2021-02-25 MED ORDER — SUCRALFATE 1 GM/10ML PO SUSP: 1.0000 g | Freq: Three times a day (TID) | ORAL | 0 refills | Status: DC

## 2021-02-25 MED ORDER — LIDOCAINE VISCOUS HCL 2 % MT SOLN: 15.0000 mL | OROMUCOSAL | 0 refills | Status: DC | PRN

## 2021-02-25 NOTE — Addendum Note (Signed)
Addended by: Derek Jack on: 02/25/2021 05:28 PM   Modules accepted: Orders

## 2021-02-26 ENCOUNTER — Telehealth (HOSPITAL_COMMUNITY): Payer: Self-pay | Admitting: *Deleted

## 2021-02-26 NOTE — Telephone Encounter (Signed)
Patient had called after hours nurse line with questions regarding carafate/lidocaine script.  He did not receive specific instructions on how to use this.  It was filled separately with a bottle of carafate and a bottle of 2% lidocaine.  This is to be mixed 1:1 with 5 ml each and to be swished and spit.  Mechanism of action explained to him and he verbalized understanding.  Will let me know if this is not helpful and we will reassess.

## 2021-02-27 ENCOUNTER — Ambulatory Visit (HOSPITAL_COMMUNITY): Payer: BC Managed Care – PPO | Admitting: Hematology

## 2021-02-27 ENCOUNTER — Other Ambulatory Visit (HOSPITAL_COMMUNITY): Payer: BC Managed Care – PPO

## 2021-02-27 ENCOUNTER — Ambulatory Visit (HOSPITAL_COMMUNITY): Payer: BC Managed Care – PPO

## 2021-03-03 ENCOUNTER — Encounter: Payer: Self-pay | Admitting: Hematology and Oncology

## 2021-03-04 ENCOUNTER — Other Ambulatory Visit: Payer: Self-pay

## 2021-03-04 ENCOUNTER — Ambulatory Visit (INDEPENDENT_AMBULATORY_CARE_PROVIDER_SITE_OTHER): Payer: Medicare Other | Admitting: Family Medicine

## 2021-03-04 ENCOUNTER — Encounter: Payer: Self-pay | Admitting: Family Medicine

## 2021-03-04 VITALS — BP 108/73 | Temp 97.4°F | Wt 199.2 lb

## 2021-03-04 DIAGNOSIS — I10 Essential (primary) hypertension: Secondary | ICD-10-CM

## 2021-03-04 DIAGNOSIS — C786 Secondary malignant neoplasm of retroperitoneum and peritoneum: Secondary | ICD-10-CM | POA: Diagnosis not present

## 2021-03-04 DIAGNOSIS — F419 Anxiety disorder, unspecified: Secondary | ICD-10-CM | POA: Diagnosis not present

## 2021-03-04 DIAGNOSIS — I25118 Atherosclerotic heart disease of native coronary artery with other forms of angina pectoris: Secondary | ICD-10-CM

## 2021-03-04 DIAGNOSIS — K766 Portal hypertension: Secondary | ICD-10-CM | POA: Insufficient documentation

## 2021-03-04 MED ORDER — OXYCODONE HCL 5 MG PO TABS: 5.0000 mg | ORAL_TABLET | ORAL | 0 refills | Status: DC | PRN

## 2021-03-04 NOTE — Progress Notes (Signed)
° °  Subjective:    Patient ID: Logan Alvarez, male    DOB: 1955/11/21, 66 y.o.   MRN: [id number]  HPI Pt here for follow up on anxiety. Pt taking Xanax 4 times per day. Pt states "things not going the way he planned" Extremely nice patient who is going through some rough times.  He is doing the best he can to cope with the discomforts he is having.  He also has chronic anxiety which affects things as well plus he also has cancer for which he is being treated with recent elevation of liver enzymes but those have improved compared to where they were. He does have intermittent severe pain that requires him to take oxycodone on a intermittent basis he is aware never to double up on this or take it frequently because it could interact with his Xanax.  He was warned that if the medicine makes him feel drowsy or drugged not to drive or do any type of dangerous equipment Patient does have underlying chest pain will be going through nuclear medicine test to look at coronary arteries later in February He also has underlying history of liver problems and portal hypertension but not showing any type of varicoceles or severe swelling in his legs   Review of Systems     Objective:   Physical Exam  General-in no acute distress Eyes-no discharge Lungs-respiratory rate normal, CTA CV-no murmurs,RRR Extremities skin warm dry no edema Neuro grossly normal Behavior normal, alert  Reviewed over his labs reviewed over CT scan     Assessment & Plan:   1. Anxiety Continue the Xanax.  Under a lot of stress.  Handling fairly well given everything going on.  2. Secondary malignant neoplasm of retroperitoneum and peritoneum ([place]) Currently having a lot of pains and discomfort related to the treatments.  It bumped up the Lyrica 100 mg 3 times daily this helps a little but not a lot not causing drowsiness Prescription for oxycodone sent in for sparing use caution drowsiness see discussion above We talked at  length about how the markers were used to monitor progress if they continue to go up there do a CT scan sooner 3. Portal hypertension ([facility]) Stable currently monitor closely  4. Coronary artery disease of native heart with stable angina pectoris, unspecified vessel or lesion type [facility]) Has imaging coming up in February  5. Primary hypertension Blood pressure good control recent labs were reviewed will have patient clarify which losartan he is using  Follow-up in 6 to 8 weeks sooner problems

## 2021-03-05 ENCOUNTER — Other Ambulatory Visit: Payer: Self-pay | Admitting: Cardiology

## 2021-03-05 ENCOUNTER — Other Ambulatory Visit: Payer: Self-pay | Admitting: Family Medicine

## 2021-03-05 ENCOUNTER — Telehealth (HOSPITAL_COMMUNITY): Payer: Self-pay | Admitting: *Deleted

## 2021-03-05 ENCOUNTER — Encounter (HOSPITAL_COMMUNITY): Payer: Self-pay | Admitting: *Deleted

## 2021-03-05 ENCOUNTER — Encounter: Payer: Self-pay | Admitting: Hematology and Oncology

## 2021-03-05 DIAGNOSIS — R079 Chest pain, unspecified: Secondary | ICD-10-CM

## 2021-03-05 MED ORDER — LOSARTAN POTASSIUM 25 MG PO TABS: 25.0000 mg | ORAL_TABLET | Freq: Every morning | ORAL | 1 refills | Status: DC

## 2021-03-05 NOTE — Telephone Encounter (Signed)
Patient given detailed instructions per Myocardial Perfusion Study Information Sheet for the test on 03/12/21 at 0800. Patient notified to arrive 15 minutes early and that it is imperative to arrive on time for appointment to keep from having the test rescheduled.  If you need to cancel or reschedule your appointment, please call the office within 24 hours of your appointment. . Patient verbalized understanding.[place] Mychart letter sent

## 2021-03-12 ENCOUNTER — Other Ambulatory Visit: Payer: Self-pay

## 2021-03-12 ENCOUNTER — Ambulatory Visit (HOSPITAL_COMMUNITY): Payer: BC Managed Care – PPO | Admitting: Hematology

## 2021-03-12 ENCOUNTER — Ambulatory Visit (HOSPITAL_COMMUNITY): Payer: BC Managed Care – PPO

## 2021-03-12 ENCOUNTER — Ambulatory Visit (HOSPITAL_COMMUNITY): Payer: Medicare Other | Attending: Internal Medicine

## 2021-03-12 ENCOUNTER — Other Ambulatory Visit (HOSPITAL_COMMUNITY): Payer: BC Managed Care – PPO

## 2021-03-12 ENCOUNTER — Encounter: Payer: Self-pay | Admitting: Hematology and Oncology

## 2021-03-12 DIAGNOSIS — R079 Chest pain, unspecified: Secondary | ICD-10-CM | POA: Insufficient documentation

## 2021-03-12 LAB — MYOCARDIAL PERFUSION IMAGING
LV dias vol: 72 mL (ref 62–150)
LV sys vol: 26 mL
Nuc Stress EF: 64 %
Peak HR: 91 {beats}/min
Rest HR: 76 {beats}/min
Rest Nuclear Isotope Dose: 10.2 mCi
SDS: -1
SRS: 5
SSS: 4
ST Depression (mm): 0 mm
Stress Nuclear Isotope Dose: 32.7 mCi
TID: 0.95

## 2021-03-12 MED ORDER — TECHNETIUM TC 99M TETROFOSMIN IV KIT
32.7000 | PACK | Freq: Once | INTRAVENOUS | Status: AC | PRN
  Administered 2021-03-12: 32.7 via INTRAVENOUS
  Filled 2021-03-12: qty 33

## 2021-03-12 MED ORDER — REGADENOSON 0.4 MG/5ML IV SOLN
0.4000 mg | Freq: Once | INTRAVENOUS | Status: AC
  Administered 2021-03-12: 0.4 mg via INTRAVENOUS

## 2021-03-12 MED ORDER — TECHNETIUM TC 99M TETROFOSMIN IV KIT
10.2000 | PACK | Freq: Once | INTRAVENOUS | Status: AC | PRN
  Administered 2021-03-12: 10.2 via INTRAVENOUS
  Filled 2021-03-12: qty 11

## 2021-03-12 NOTE — Progress Notes (Unsigned)
CARDIOLOGY CONSULT NOTE       Patient ID: Logan Alvarez MRN: [id number] DOB/AGE: [birth year] 66 y.o.  Admit date: (Not on file) Referring Physician: Wolfgang Phoenix Primary Physician: Kathyrn Drown, MD Primary Cardiologist: New Reason for Consultation: CAD  Active Problems:   * No active hospital problems. *   HPI:  66 y.o. seen for Dr Wolfgang Phoenix for CAD I have not seen him since cath in 2014 that showed only obstructive dx in small D1 that was thought best to Rx medically Seen more recently by Dr Domenic Polite 12/02/20 and Dr Gardiner Rhyme 02/12/21 He has stage 4 hepatocellular cancer Seen in [facility] ED with chest pain and troponin 3068 He had cath 12/03/20 with DES to LCX with residual distal LCX dx Rx medically Seen again in ED for likely non cardiac pain 02/12/21 r/o no acute ECG changes. Dr Gilman Schmidt ordered a myovue study Done 03/12/21 with no ischemia EF 64% Fixed basal inferior defect ? Artifact Low risk Has anxiety and chronic pain using xanax and oxycodone   CT Abdomen 01/24/21 showed interval progression of peritoneal carcinomatosis with cirrhosis  Initial diagnosis 11/12/17 with chemo 08/15/20   ***  ROS All other systems reviewed and negative except as noted above  Past Medical History:  Diagnosis Date   CAD (coronary artery disease)    a. 01/2013: cath showing nonobstructive disease. b. 11/2020: NSTEMI with DES to mid-LCx and medical management recommended of distal LCx disease given small vessel size   Hepatocellular carcinoma ([place])    Stage IV   Hyperlipidemia    Hypertension    Testosterone deficiency 2009    Family History  Problem Relation Age of Onset   Heart attack Father        35's    Social History   Socioeconomic History   Marital status: Widowed    Spouse name: Not on file   Number of children: Not on file   Years of education: Not on file   Highest education level: Not on file  Occupational History   Not on file  Tobacco Use   Smoking status: Never   Smokeless tobacco:  Never  Vaping Use   Vaping Use: Never used  Substance and Sexual Activity   Alcohol use: Not Currently    Comment: 4-6 beers a day   Drug use: No   Sexual activity: Not on file  Other Topics Concern   Not on file  Social History Narrative   Not on file   Social Determinants of Health   Financial Resource Strain: Not on file  Food Insecurity: Not on file  Transportation Needs: Not on file  Physical Activity: Not on file  Stress: Not on file  Social Connections: Not on file  Intimate Partner Violence: Not on file    Past Surgical History:  Procedure Laterality Date   BALLOON DILATION  07/30/2020   Procedure: BALLOON DILATION;  Surgeon: Eloise Harman, DO;  Location: [facility];  Service: Endoscopy;;   COLONOSCOPY N/A 03/12/2017   Procedure: COLONOSCOPY;  Surgeon: Danie Binder, MD;  Location: [facility];  Service: Endoscopy;  Laterality: N/A;  2:00   COLONOSCOPY WITH PROPOFOL N/A 06/18/2020   Procedure: COLONOSCOPY WITH PROPOFOL;  Surgeon: Eloise Harman, DO;  Location: [facility];  Service: Endoscopy;  Laterality: N/A;  pm appt   CORONARY STENT INTERVENTION N/A 12/03/2020   Procedure: CORONARY STENT INTERVENTION;  Surgeon: Jettie Booze, MD;  Location: [facility];  Service: Cardiovascular;  Laterality: N/A;   ESOPHAGOGASTRODUODENOSCOPY (EGD) WITH PROPOFOL N/A 07/30/2020   Procedure: ESOPHAGOGASTRODUODENOSCOPY (EGD) WITH PROPOFOL;  Surgeon: Eloise Harman, DO;  Location: [facility];  Service: Endoscopy;  Laterality: N/A;  11:45am   LEFT HEART CATH AND CORONARY ANGIOGRAPHY N/A 12/03/2020   Procedure: LEFT HEART CATH AND CORONARY ANGIOGRAPHY;  Surgeon: Jettie Booze, MD;  Location: [facility];  Service: Cardiovascular;  Laterality: N/A;   LEFT HEART CATHETERIZATION WITH CORONARY ANGIOGRAM N/A 02/01/2013   Procedure: LEFT HEART CATHETERIZATION WITH CORONARY ANGIOGRAM;  Surgeon: Josue Hector, MD;  Location: [facility] CATH LAB;  Service:  Cardiovascular;  Laterality: N/A;   NECK SURGERY     POLYPECTOMY  06/18/2020   Procedure: POLYPECTOMY;  Surgeon: Eloise Harman, DO;  Location: [facility];  Service: Endoscopy;;  snare and biospy      Current Outpatient Medications:    ALPRAZolam (XANAX) 0.5 MG tablet, Take one tablet po QID, Disp: 120 tablet, Rfl: 4   aspirin EC 81 MG tablet, Take 1 tablet (81 mg total) by mouth daily. Swallow whole., Disp: 30 tablet, Rfl: 11   dextromethorphan-guaiFENesin (ROBITUSSIN-DM) 10-100 MG/5ML liquid, Take 5-10 mLs by mouth every 6 (six) hours as needed for cough., Disp: , Rfl:    lidocaine (XYLOCAINE) 2 % solution, Use as directed 15 mLs in the mouth or throat every 4 (four) hours as needed for mouth pain. Swish and spit along with 15 mL of Carafate every 4 hours as needed for mild pain., Disp: 420 mL, Rfl: 0   losartan (COZAAR) 25 MG tablet, Take 1 tablet (25 mg total) by mouth in the morning., Disp: 90 tablet, Rfl: 1   metoprolol tartrate (LOPRESSOR) 25 MG tablet, Take 0.5 tablets (12.5 mg total) by mouth 2 (two) times daily., Disp: 30 tablet, Rfl: 5   nitroGLYCERIN (NITROSTAT) 0.4 MG SL tablet, Place 1 tablet (0.4 mg total) under the tongue every 5 (five) minutes as needed for chest pain., Disp: 25 tablet, Rfl: 2   oxyCODONE (OXY IR/ROXICODONE) 5 MG immediate release tablet, Take 1 tablet (5 mg total) by mouth every 4 (four) hours as needed (pain)., Disp: 30 tablet, Rfl: 0   pregabalin (LYRICA) 100 MG capsule, Take 1 capsule (100 mg total) by mouth 3 (three) times daily., Disp: 90 capsule, Rfl: 3   sodium chloride (OCEAN) 0.65 % nasal spray, 1 spray as needed for congestion., Disp: , Rfl:    sucralfate (CARAFATE) 1 GM/10ML suspension, Take 10 mLs (1 g total) by mouth 4 (four) times daily -  with meals and at bedtime. Mix 10 mL of Carafate with 15 mL of lidocaine, swish and spit every 4 hours as needed for mild pain., Disp: 420 mL, Rfl: 0   ticagrelor (BRILINTA) 90 MG TABS tablet, Take 1 tablet  (90 mg total) by mouth 2 (two) times daily., Disp: 14 tablet, Rfl: 0   Vitamin D, Ergocalciferol, (DRISDOL) 1.25 MG (50000 UNIT) CAPS capsule, TAKE 1 CAPSULE BY MOUTH WEEKLY (Patient taking differently: Take 50,000 Units by mouth every Monday. TAKE 1 CAPSULE BY MOUTH WEEKLY), Disp: 4 capsule, Rfl: 2    Physical Exam: There were no vitals taken for this visit. *** HELP TEXT ***  This SmartLink requires parameters. Parameters are variables that are added to the [facility] name to request specific information. The parameter for .curwt is the number of encounters to display readings from.  For example: .curwt[4  In this example, the SmartLink displays readings from the last four encounters.    {  physical exam:3041130}  Labs:   Lab Results  Component Value Date   WBC 9.8 02/20/2021   HGB 15.0 02/20/2021   HCT 45.1 02/20/2021   MCV 87.1 02/20/2021   PLT 345 02/20/2021   No results for input(s): NA, K, CL, CO2, BUN, CREATININE, CALCIUM, PROT, BILITOT, ALKPHOS, ALT, AST, GLUCOSE in the last 168 hours.  Invalid input(s): LABALBU Lab Results  Component Value Date   CKTOTAL 42 11/25/2020    Lab Results  Component Value Date   CHOL 158 12/03/2020   CHOL 168 05/16/2020   CHOL 172 06/06/2019   Lab Results  Component Value Date   HDL 36 (L) 12/03/2020   HDL 47 05/16/2020   HDL 46 06/06/2019   Lab Results  Component Value Date   LDLCALC 99 12/03/2020   LDLCALC 102 (H) 05/16/2020   LDLCALC 88 06/06/2019   Lab Results  Component Value Date   TRIG 116 12/03/2020   TRIG 105 05/16/2020   TRIG 226 (H) 06/06/2019   Lab Results  Component Value Date   CHOLHDL 4.4 12/03/2020   CHOLHDL 3.6 05/16/2020   CHOLHDL 3.7 06/06/2019   No results found for: LDLDIRECT    Radiology: DG Chest 2 View  Result Date: 02/12/2021 CLINICAL DATA:  Chest pressure.  Recent COVID diagnosis. EXAM: CHEST - 2 VIEW COMPARISON:  12/05/2020 and CT chest 10/04/2020. FINDINGS: Trachea is midline. Heart size  normal. Lungs are clear. No pleural fluid. Old right rib fractures. IMPRESSION: No acute findings. Electronically Signed   By: Lorin Picket M.D.   On: 02/12/2021 11:49   MYOCARDIAL PERFUSION IMAGING  Result Date: 03/12/2021   Findings are consistent with no prior ischemia. The study is low risk.   No ST deviation was noted.   LV perfusion is abnormal. Defect 1: There is a small defect with mild reduction in uptake present in the mid to basal inferior location(s) that is fixed. There is normal wall motion in the defect area. Consistent with artifact caused by subdiaphragmatic activity.   Left ventricular function is normal. Nuclear stress EF: 64 %. The left ventricular ejection fraction is normal (55-65%). End diastolic cavity size is normal. End systolic cavity size is normal.   Prior study not available for comparison. Small sized, mild intensity fixed basal to mid inferior perfusion defect, likely artifact. No reversible ischemia. LVEF 64% with normal wall motion. This is a low risk study. No prior for comparison.    EKG: SR rate 82 normal 02/12/21    ASSESSMENT AND PLAN:   CAD:  stenting of mid LCX 12/03/20 DAT EF normal non ischemic myovue 03/12/21 Continue beta blocker not on statin with elevated LFTls and metastatic liver cancer Cancer:  F/U [place] recent CT with progressive mets to omentum for chemo Prognosis seems poor Anxiety/Pain:  xanax and oxycodone per Dr Wolfgang Phoenix HTN:  Well controlled.  Continue current medications and low sodium Dash type diet.    ***  Signed: Jenkins Rouge 03/12/2021, 4:45 PM

## 2021-03-13 ENCOUNTER — Encounter: Payer: Self-pay | Admitting: Hematology and Oncology

## 2021-03-13 ENCOUNTER — Inpatient Hospital Stay (HOSPITAL_COMMUNITY): Payer: BC Managed Care – PPO

## 2021-03-13 ENCOUNTER — Inpatient Hospital Stay (HOSPITAL_BASED_OUTPATIENT_CLINIC_OR_DEPARTMENT_OTHER): Payer: BC Managed Care – PPO | Admitting: Hematology

## 2021-03-13 ENCOUNTER — Inpatient Hospital Stay (HOSPITAL_COMMUNITY): Payer: BC Managed Care – PPO | Attending: Hematology

## 2021-03-13 VITALS — BP 104/66 | HR 85 | Temp 97.3°F | Resp 20 | Ht 68.11 in | Wt 191.3 lb

## 2021-03-13 DIAGNOSIS — I10 Essential (primary) hypertension: Secondary | ICD-10-CM | POA: Insufficient documentation

## 2021-03-13 DIAGNOSIS — R7401 Elevation of levels of liver transaminase levels: Secondary | ICD-10-CM | POA: Diagnosis not present

## 2021-03-13 DIAGNOSIS — R978 Other abnormal tumor markers: Secondary | ICD-10-CM | POA: Insufficient documentation

## 2021-03-13 DIAGNOSIS — R970 Elevated carcinoembryonic antigen [CEA]: Secondary | ICD-10-CM | POA: Insufficient documentation

## 2021-03-13 DIAGNOSIS — K5900 Constipation, unspecified: Secondary | ICD-10-CM | POA: Diagnosis not present

## 2021-03-13 DIAGNOSIS — C220 Liver cell carcinoma: Secondary | ICD-10-CM | POA: Diagnosis not present

## 2021-03-13 DIAGNOSIS — C786 Secondary malignant neoplasm of retroperitoneum and peritoneum: Secondary | ICD-10-CM | POA: Diagnosis not present

## 2021-03-13 DIAGNOSIS — Z5111 Encounter for antineoplastic chemotherapy: Secondary | ICD-10-CM | POA: Diagnosis not present

## 2021-03-13 DIAGNOSIS — M2550 Pain in unspecified joint: Secondary | ICD-10-CM | POA: Diagnosis not present

## 2021-03-13 LAB — COMPREHENSIVE METABOLIC PANEL
ALT: 967 U/L — ABNORMAL HIGH (ref 0–44)
AST: 786 U/L — ABNORMAL HIGH (ref 15–41)
Albumin: 3.7 g/dL (ref 3.5–5.0)
Alkaline Phosphatase: 268 U/L — ABNORMAL HIGH (ref 38–126)
Anion gap: 8 (ref 5–15)
BUN: 9 mg/dL (ref 8–23)
CO2: 24 mmol/L (ref 22–32)
Calcium: 9.4 mg/dL (ref 8.9–10.3)
Chloride: 100 mmol/L (ref 98–111)
Creatinine, Ser: 0.82 mg/dL (ref 0.61–1.24)
GFR, Estimated: >60 mL/min (ref >60)
Glucose, Bld: 119 mg/dL — ABNORMAL HIGH (ref 70–99)
Potassium: 3.6 mmol/L (ref 3.5–5.1)
Sodium: 132 mmol/L — ABNORMAL LOW (ref 135–145)
Total Bilirubin: 2 mg/dL — ABNORMAL HIGH (ref 0.3–1.2)
Total Protein: 7.8 g/dL (ref 6.5–8.1)

## 2021-03-13 LAB — CBC WITH DIFFERENTIAL/PLATELET
Abs Immature Granulocytes: 0.02 10*3/uL (ref 0.00–0.07)
Basophils Absolute: 0 10*3/uL (ref 0.0–0.1)
Basophils Relative: 1 %
Eosinophils Absolute: 0.1 10*3/uL (ref 0.0–0.5)
Eosinophils Relative: 2 %
HCT: 44.2 % (ref 39.0–52.0)
Hemoglobin: 14.4 g/dL (ref 13.0–17.0)
Immature Granulocytes: 0 %
Lymphocytes Relative: 10 %
Lymphs Abs: 0.7 10*3/uL (ref 0.7–4.0)
MCH: 27.7 pg (ref 26.0–34.0)
MCHC: 32.6 g/dL (ref 30.0–36.0)
MCV: 85 fL (ref 80.0–100.0)
Monocytes Absolute: 1.1 10*3/uL — ABNORMAL HIGH (ref 0.1–1.0)
Monocytes Relative: 15 %
Neutro Abs: 5.3 10*3/uL (ref 1.7–7.7)
Neutrophils Relative %: 72 %
Platelets: 280 10*3/uL (ref 150–400)
RBC: 5.2 MIL/uL (ref 4.22–5.81)
RDW: 16.2 % — ABNORMAL HIGH (ref 11.5–15.5)
WBC: 7.3 10*3/uL (ref 4.0–10.5)
nRBC: 0 % (ref 0.0–0.2)

## 2021-03-13 LAB — MAGNESIUM: Magnesium: 2 mg/dL (ref 1.7–2.4)

## 2021-03-13 MED ORDER — PREDNISONE 20 MG PO TABS: 10.0000 mg | ORAL_TABLET | Freq: Every day | ORAL | 1 refills | Status: DC

## 2021-03-13 NOTE — Patient Instructions (Signed)
[place] at [facility] Discharge Instructions   You were seen and examined today by Dr. Delton Coombes.  He reviewed your lab work with you.  Your liver enzymes have markedly elevated.  We will hold your treatment today.  A prescription was sent for prednisone.  On day 1 take 2 tablets daily. On day 2 take 1 tablet.  Starting on Day 3, take 1/2 tablet daily.  Continue this until you see Dr. Raliegh Ip again.  We will see if this helps control your bone pain.  We will obtain a CT scan prior to your next visit.  Return as scheduled to review the CT results.    Thank you for choosing [place] at [facility] to provide your oncology and hematology care.  To afford each patient quality time with our provider, please arrive at least 15 minutes before your scheduled appointment time.   If you have a lab appointment with the [facility] please come in thru the Main Entrance and check in at the main information desk.  You need to re-schedule your appointment should you arrive 10 or more minutes late.  We strive to give you quality time with our providers, and arriving late affects you and other patients whose appointments are after yours.  Also, if you no show three or more times for appointments you may be dismissed from the clinic at the providers discretion.     Again, thank you for choosing [facility]  Our hope is that these requests will decrease the amount of time that you wait before being seen by our physicians.       _____________________________________________________________  Should you have questions after your visit to [facility], please contact our office at [phone] and follow the prompts.  Our office hours are 8:00 a.m. and 4:30 p.m. Monday - Friday.  Please note that voicemails left after 4:00 p.m. may not be returned until the following business day.  We are closed weekends and major holidays.  You do have  access to a nurse 24-7, just call the main number to the clinic [phone] and do not press any options, hold on the line and a nurse will answer the phone.    For prescription refill requests, have your pharmacy contact our office and allow 72 hours.    Due to Covid, you will need to wear a mask upon entering the hospital. If you do not have a mask, a mask will be given to you at the Main Entrance upon arrival. For doctor visits, patients may have 1 support person age 37 or older with them. For treatment visits, patients can not have anyone with them due to social distancing guidelines and our immunocompromised population.

## 2021-03-13 NOTE — Progress Notes (Signed)
[place], [place] [address]   CLINIC:  Medical Oncology/Hematology  PCP:  Kathyrn Drown, MD [address] [place] / [place] [address] [phone]   REASON FOR VISIT:  Follow-up for hepatocellular carcinoma  PRIOR THERAPY: none  NGS Results: not done  CURRENT THERAPY: Atezolizumab + Bevacizumab q21d Maintenance  BRIEF ONCOLOGIC HISTORY:  Oncology History  Hepatocellular carcinoma ([facility])  11/12/2017 Initial Diagnosis   Hepatocellular carcinoma ([facility])   07/29/2020 Cancer Staging   Staging form: Liver, AJCC 8th Edition - Clinical stage from 07/29/2020: Stage IVB (cT1a, cN0, pM1) - Signed by Orson Slick, MD on 07/29/2020 Stage prefix: Initial diagnosis Histologic grade (G): G3 Histologic grading system: 4 grade system    08/15/2020 -  Chemotherapy   Patient is on Treatment Plan : [place] Atezolizumab + Bevacizumab q21d Maintenance       CANCER STAGING:  Cancer Staging  Hepatocellular carcinoma ([facility]) Staging form: Liver, AJCC 8th Edition - Clinical stage from 07/29/2020: Stage IVB (cT1a, cN0, pM1) - Signed by Orson Slick, MD on 07/29/2020   INTERVAL HISTORY:  [name], a 66 y.o. male, returns for routine follow-up and consideration for next cycle of chemotherapy. Logan Alvarez was last seen on 02/20/2021.  Due for cycle #10 of Atezolizumab + Bevacizumab today.   Overall, he tells me he has been feeling pretty well. He reports muscle, joint, and mouth pain. He also reports intermittent diarrhea and constipation. He has lost 8 lbs since 01/31. He reports his mouth feels "raw." He reports occasional abdominal pain. He reduced Lyrica from 3 tablets daily to 2 tablets daily. He reports right knee and right elbow pain. He denies nosebleeds, and he reports bleeding following hard stools and constipation. .   Overall, he feels ready for next cycle of chemo today.   REVIEW OF SYSTEMS:  Review of Systems  Constitutional:   Positive for appetite change.  HENT:   Positive for trouble swallowing (chewing). Negative for nosebleeds.        Mouth pain  Respiratory:  Positive for shortness of breath.   Gastrointestinal:  Positive for abdominal pain, blood in stool, constipation and diarrhea.  Musculoskeletal:  Positive for arthralgias (4/10) and myalgias (4/10).  Hematological:  Does not bruise/bleed easily.  Psychiatric/Behavioral:  Positive for depression. The patient is nervous/anxious.   All other systems reviewed and are negative.  PAST MEDICAL/SURGICAL HISTORY:  Past Medical History:  Diagnosis Date   CAD (coronary artery disease)    a. 01/2013: cath showing nonobstructive disease. b. 11/2020: NSTEMI with DES to mid-LCx and medical management recommended of distal LCx disease given small vessel size   Hepatocellular carcinoma ([facility])    Stage IV   Hyperlipidemia    Hypertension    Testosterone deficiency 2009   Past Surgical History:  Procedure Laterality Date   BALLOON DILATION  07/30/2020   Procedure: BALLOON DILATION;  Surgeon: Eloise Harman, DO;  [facility];  Service: Endoscopy;;   COLONOSCOPY N/A 03/12/2017   Procedure: COLONOSCOPY;  Surgeon: Danie Binder, MD;  [facility];  Service: Endoscopy;  Laterality: N/A;  2:00   COLONOSCOPY WITH PROPOFOL N/A 06/18/2020   Procedure: COLONOSCOPY WITH PROPOFOL;  Surgeon: Eloise Harman, DO;  [facility];  Service: Endoscopy;  Laterality: N/A;  pm appt   CORONARY STENT INTERVENTION N/A 12/03/2020   Procedure: CORONARY STENT INTERVENTION;  Surgeon: Jettie Booze, MD;  Location: [facility]  INVASIVE CV LAB;  Service: Cardiovascular;  Laterality: N/A;   ESOPHAGOGASTRODUODENOSCOPY (EGD) WITH PROPOFOL N/A 07/30/2020   Procedure: ESOPHAGOGASTRODUODENOSCOPY (EGD) WITH PROPOFOL;  Surgeon: Eloise Harman, DO;  [facility];  Service: Endoscopy;  Laterality: N/A;  11:45am   LEFT HEART CATH AND CORONARY ANGIOGRAPHY N/A  12/03/2020   Procedure: LEFT HEART CATH AND CORONARY ANGIOGRAPHY;  Surgeon: Jettie Booze, MD;  Location: [facility] CV LAB;  Service: Cardiovascular;  Laterality: N/A;   LEFT HEART CATHETERIZATION WITH CORONARY ANGIOGRAM N/A 02/01/2013   Procedure: LEFT HEART CATHETERIZATION WITH CORONARY ANGIOGRAM;  Surgeon: Josue Hector, MD;  Location: [facility] CATH LAB;  Service: Cardiovascular;  Laterality: N/A;   NECK SURGERY     POLYPECTOMY  06/18/2020   Procedure: POLYPECTOMY;  Surgeon: Eloise Harman, DO;  [facility];  Service: Endoscopy;;  snare and biospy    SOCIAL HISTORY:  Social History   Socioeconomic History   Marital status: Widowed    Spouse name: Not on file   Number of children: Not on file   Years of education: Not on file   Highest education level: Not on file  Occupational History   Not on file  Tobacco Use   Smoking status: Never   Smokeless tobacco: Never  Vaping Use   Vaping Use: Never used  Substance and Sexual Activity   Alcohol use: Not Currently    Comment: 4-6 beers a day   Drug use: No   Sexual activity: Not on file  Other Topics Concern   Not on file  Social History Narrative   Not on file   Social Determinants of Health   Financial Resource Strain: Not on file  Food Insecurity: Not on file  Transportation Needs: Not on file  Physical Activity: Not on file  Stress: Not on file  Social Connections: Not on file  Intimate Partner Violence: Not on file    FAMILY HISTORY:  Family History  Problem Relation Age of Onset   Heart attack Father        28's    CURRENT MEDICATIONS:  Current Outpatient Medications  Medication Sig Dispense Refill   ALPRAZolam (XANAX) 0.5 MG tablet Take one tablet po QID 120 tablet 4   aspirin EC 81 MG tablet Take 1 tablet (81 mg total) by mouth daily. Swallow whole. 30 tablet 11   dextromethorphan-guaiFENesin (ROBITUSSIN-DM) 10-100 MG/5ML liquid Take 5-10 mLs by mouth every 6 (six) hours as needed for  cough.     lidocaine (XYLOCAINE) 2 % solution Use as directed 15 mLs in the mouth or throat every 4 (four) hours as needed for mouth pain. Swish and spit along with 15 mL of Carafate every 4 hours as needed for mild pain. 420 mL 0   losartan (COZAAR) 25 MG tablet Take 1 tablet (25 mg total) by mouth in the morning. 90 tablet 1   metoprolol tartrate (LOPRESSOR) 25 MG tablet Take 0.5 tablets (12.5 mg total) by mouth 2 (two) times daily. 30 tablet 5   nitroGLYCERIN (NITROSTAT) 0.4 MG SL tablet Place 1 tablet (0.4 mg total) under the tongue every 5 (five) minutes as needed for chest pain. 25 tablet 2   oxyCODONE (OXY IR/ROXICODONE) 5 MG immediate release tablet Take 1 tablet (5 mg total) by mouth every 4 (four) hours as needed (pain). 30 tablet 0   predniSONE (DELTASONE) 20 MG tablet Take 0.5 tablets (10 mg total) by mouth daily with breakfast. 30 tablet 1   pregabalin (LYRICA)  100 MG capsule Take 1 capsule (100 mg total) by mouth 3 (three) times daily. 90 capsule 3   sodium chloride (OCEAN) 0.65 % nasal spray 1 spray as needed for congestion.     sucralfate (CARAFATE) 1 GM/10ML suspension Take 10 mLs (1 g total) by mouth 4 (four) times daily -  with meals and at bedtime. Mix 10 mL of Carafate with 15 mL of lidocaine, swish and spit every 4 hours as needed for mild pain. 420 mL 0   ticagrelor (BRILINTA) 90 MG TABS tablet Take 1 tablet (90 mg total) by mouth 2 (two) times daily. 14 tablet 0   Vitamin D, Ergocalciferol, (DRISDOL) 1.25 MG (50000 UNIT) CAPS capsule TAKE 1 CAPSULE BY MOUTH WEEKLY (Patient taking differently: Take 50,000 Units by mouth every Monday. TAKE 1 CAPSULE BY MOUTH WEEKLY) 4 capsule 2   No current facility-administered medications for this visit.    ALLERGIES:  Allergies  Allergen Reactions   Cymbalta [Duloxetine Hcl]     Bad dreams   Lodine [Etodolac] Nausea And Vomiting   Zoloft [Sertraline Hcl]     REDUCED URINARY FLOW,NOCTURIA    PHYSICAL EXAM:  Performance status  (ECOG): 1 - Symptomatic but completely ambulatory  Vitals:   03/13/21 0943  BP: 104/66  Pulse: 85  Resp: 20  Temp: (!) 97.3 F (36.3 C)  SpO2: 100%   Wt Readings from Last 3 Encounters:  03/13/21 191 lb 4.8 oz (86.8 kg)  03/12/21 194 lb (88 kg)  03/04/21 199 lb 3.2 oz (90.4 kg)   Physical Exam Vitals reviewed.  Constitutional:      Appearance: Normal appearance.  Cardiovascular:     Rate and Rhythm: Normal rate and regular rhythm.     Pulses: Normal pulses.     Heart sounds: Normal heart sounds.  Pulmonary:     Effort: Pulmonary effort is normal.     Breath sounds: Normal breath sounds.  Neurological:     General: No focal deficit present.     Mental Status: He is alert and oriented to person, place, and time.  Psychiatric:        Mood and Affect: Mood normal.        Behavior: Behavior normal.    LABORATORY DATA:  I have reviewed the labs as listed.  CBC Latest Ref Rng & Units 03/13/2021 02/20/2021 02/12/2021  WBC 4.0 - 10.5 K/uL 7.3 9.8 8.8  Hemoglobin 13.0 - 17.0 g/dL 14.4 15.0 14.9  Hematocrit 39.0 - 52.0 % 44.2 45.1 45.3  Platelets 150 - 400 K/uL 280 345 326   CMP Latest Ref Rng & Units 03/13/2021 02/20/2021 02/12/2021  Glucose 70 - 99 mg/dL 119(H) 120(H) 153(H)  BUN 8 - 23 mg/dL 9 9 9   Creatinine 0.61 - 1.24 mg/dL 0.82 0.75 0.66  Sodium 135 - 145 mmol/L 132(L) 133(L) 131(L)  Potassium 3.5 - 5.1 mmol/L 3.6 4.0 4.0  Chloride 98 - 111 mmol/L 100 98 97(L)  CO2 22 - 32 mmol/L 24 26 26   Calcium 8.9 - 10.3 mg/dL 9.4 9.6 9.6  Total Protein 6.5 - 8.1 g/dL 7.8 8.1 -  Total Bilirubin 0.3 - 1.2 mg/dL 2.0(H) 1.2 -  Alkaline Phos 38 - 126 U/L 268(H) 103 -  AST 15 - 41 U/L 786(H) 93(H) -  ALT 0 - 44 U/L 967(H) 121(H) -    DIAGNOSTIC IMAGING:  I have independently reviewed the scans and discussed with the patient. DG Chest 2 View  Result Date: 02/12/2021 CLINICAL DATA:  Chest  pressure.  Recent COVID diagnosis. EXAM: CHEST - 2 VIEW COMPARISON:  12/05/2020 and CT chest  10/04/2020. FINDINGS: Trachea is midline. Heart size normal. Lungs are clear. No pleural fluid. Old right rib fractures. IMPRESSION: No acute findings. Electronically Signed   By: Lorin Picket M.D.   On: 02/12/2021 11:49   MYOCARDIAL PERFUSION IMAGING  Result Date: 03/12/2021   Findings are consistent with no prior ischemia. The study is low risk.   No ST deviation was noted.   LV perfusion is abnormal. Defect 1: There is a small defect with mild reduction in uptake present in the mid to basal inferior location(s) that is fixed. There is normal wall motion in the defect area. Consistent with artifact caused by subdiaphragmatic activity.   Left ventricular function is normal. Nuclear stress EF: 64 %. The left ventricular ejection fraction is normal (55-65%). End diastolic cavity size is normal. End systolic cavity size is normal.   Prior study not available for comparison. Small sized, mild intensity fixed basal to mid inferior perfusion defect, likely artifact. No reversible ischemia. LVEF 64% with normal wall motion. This is a low risk study. No prior for comparison.     ASSESSMENT:  Hepatocellular carcinoma with peritoneal carcinomatosis: - Liver segment 5/6 partial hepatectomy on 12/08/2017-poorly differentiated hepatocellular carcinoma, 3 cm.  T1BN X- margins. - CT pancreatic protocol on 06/25/2020 at [facility] showed nodular studding and thickening of the ventral mesenteric fat suspicious for peritoneal carcinomatosis.  New pancreatic body lesion with pancreatic duct dilatation as well as right lateral abdominal wall deposit.  Found to have elevated CEA and CA 19-9. - Ultrasound-guided FNA of the right lateral abdominal wall soft tissue lesion on 06/26/2020. - Pathology consistent with metastatic HCC. - Evaluated by Dr. Lorenso Courier and started on Atezolizumab and bevacizumab on 08/15/2020. - Restaging scan on 12/05/2020 with persistent omental thickening with evidence of peritoneal metastatic  disease, minimally changed since 10/04/2020.  Persistent superficial nodule along the right lateral abdomen. - We have reviewed CT of the abdomen from 01/23/2021. - There is interval progression in the hazy/nodular soft tissue thickening throughout the small bowel mesentery, omentum and paracolic gutters with no free fluid.  Cirrhosis, with subcentimeter low-attenuation lesions in the liver, too small to characterize.   Social/family history: - Lives at home.  Son lives with him.  He worked as an Pharmacist, hospital on disability currently.  Non-smoker.  Last drank alcohol last month. - Sister had bone cancer.   PLAN:  Metastatic HCC with peritoneal carcinomatosis: - He has tolerated last treatment very well. - No immunotherapy related side effects noted.  However he lost about 8 pounds in the last 3 weeks.  His stress test was reportedly normal. - Reviewed labs today which showed elevated AST and ALT.  Total bilirubin was 2.0.  CBC was normal. - Last tumor marker CA 19-9 and CEA were both trending up.  CA 19-9 was significantly elevated.  Hence have recommended holding treatment. - Recommend CT CAP with contrast and follow-up after scan.  2.  Constipation: - Continue Colace 2 tablets daily.  3.  Joint pain/myalgias: - We have increased the Lyrica to 100 mg 3 times daily. - For the last 1 and half week, he has decreased Lyrica to 100 mg twice daily.  He is taking oxycodone at midday.  This is controlling better.  He reported constipation from Lyrica.  Constipation improved after cutting back Lyrica to twice daily. - I think a competent of immunotherapy is causing his  myalgias.  Now that we have stopped his treatment, I have recommended prednisone 40 mg on day 1, 20 mg on day 2 followed by 10 mg daily.  4.  Transaminitis: - Transaminitis resolved after we held his Lipitor and Zetia. - Today's labs show a increased AST and ALT of 786 and 967, likely either from tumor progression or effect  of treatment.  We are obtaining scan.   Orders placed this encounter:  Orders Placed This Encounter  Procedures   CT CHEST [place], MD [place] [phone]   I, Thana Ates, am acting as a scribe for Dr. Derek Jack.  I, Derek Jack MD, have reviewed the above documentation for accuracy and completeness, and I agree with the above.

## 2021-03-13 NOTE — Progress Notes (Signed)
No treatment today per Dr. Katragadda.   

## 2021-03-14 ENCOUNTER — Encounter: Payer: Self-pay | Admitting: Hematology and Oncology

## 2021-03-14 LAB — CEA: CEA: 57.9 ng/mL — ABNORMAL HIGH (ref 0.0–4.7)

## 2021-03-14 LAB — CANCER ANTIGEN 19-9: CA 19-9: 10534 U/mL — ABNORMAL HIGH (ref 0–35)

## 2021-03-20 ENCOUNTER — Ambulatory Visit: Payer: BC Managed Care – PPO | Admitting: Cardiovascular Disease

## 2021-03-20 ENCOUNTER — Encounter: Payer: Self-pay | Admitting: Hematology and Oncology

## 2021-03-21 ENCOUNTER — Other Ambulatory Visit: Payer: Self-pay

## 2021-03-21 ENCOUNTER — Encounter: Payer: Self-pay | Admitting: Hematology and Oncology

## 2021-03-21 ENCOUNTER — Ambulatory Visit (HOSPITAL_COMMUNITY)
Admission: RE | Admit: 2021-03-21 | Discharge: 2021-03-21 | Disposition: A | Discharge status: Home / Self Care | Payer: Medicare Other | Source: Ambulatory Visit | Attending: Hematology | Admitting: Hematology

## 2021-03-21 DIAGNOSIS — C220 Liver cell carcinoma: Secondary | ICD-10-CM | POA: Insufficient documentation

## 2021-03-21 MED ORDER — SODIUM CHLORIDE (PF) 0.9 % IJ SOLN
INTRAMUSCULAR | Status: AC
  Filled 2021-03-21: qty 50

## 2021-03-21 MED ORDER — IOHEXOL 300 MG/ML  SOLN
100.0000 mL | Freq: Once | INTRAMUSCULAR | Status: AC | PRN
  Administered 2021-03-21: 100 mL via INTRAVENOUS

## 2021-03-25 ENCOUNTER — Inpatient Hospital Stay (HOSPITAL_BASED_OUTPATIENT_CLINIC_OR_DEPARTMENT_OTHER): Payer: BC Managed Care – PPO | Admitting: Hematology

## 2021-03-25 ENCOUNTER — Other Ambulatory Visit: Payer: Self-pay

## 2021-03-25 ENCOUNTER — Inpatient Hospital Stay (HOSPITAL_COMMUNITY): Payer: BC Managed Care – PPO

## 2021-03-25 VITALS — BP 124/82 | HR 78 | Temp 97.9°F | Resp 18 | Ht 67.72 in | Wt 194.0 lb

## 2021-03-25 DIAGNOSIS — C220 Liver cell carcinoma: Secondary | ICD-10-CM

## 2021-03-25 DIAGNOSIS — I10 Essential (primary) hypertension: Secondary | ICD-10-CM | POA: Diagnosis not present

## 2021-03-25 DIAGNOSIS — C786 Secondary malignant neoplasm of retroperitoneum and peritoneum: Secondary | ICD-10-CM | POA: Diagnosis not present

## 2021-03-25 DIAGNOSIS — K5900 Constipation, unspecified: Secondary | ICD-10-CM | POA: Diagnosis not present

## 2021-03-25 DIAGNOSIS — R7401 Elevation of levels of liver transaminase levels: Secondary | ICD-10-CM | POA: Diagnosis not present

## 2021-03-25 DIAGNOSIS — M2550 Pain in unspecified joint: Secondary | ICD-10-CM | POA: Diagnosis not present

## 2021-03-25 DIAGNOSIS — Z5111 Encounter for antineoplastic chemotherapy: Secondary | ICD-10-CM | POA: Diagnosis not present

## 2021-03-25 LAB — COMPREHENSIVE METABOLIC PANEL
ALT: 901 U/L — ABNORMAL HIGH (ref 0–44)
AST: 425 U/L — ABNORMAL HIGH (ref 15–41)
Albumin: 3.6 g/dL (ref 3.5–5.0)
Alkaline Phosphatase: 327 U/L — ABNORMAL HIGH (ref 38–126)
Anion gap: 8 (ref 5–15)
BUN: 9 mg/dL (ref 8–23)
CO2: 27 mmol/L (ref 22–32)
Calcium: 9.4 mg/dL (ref 8.9–10.3)
Chloride: 103 mmol/L (ref 98–111)
Creatinine, Ser: 0.75 mg/dL (ref 0.61–1.24)
GFR, Estimated: >60 mL/min (ref >60)
Glucose, Bld: 137 mg/dL — ABNORMAL HIGH (ref 70–99)
Potassium: 3.9 mmol/L (ref 3.5–5.1)
Sodium: 138 mmol/L (ref 135–145)
Total Bilirubin: 1.7 mg/dL — ABNORMAL HIGH (ref 0.3–1.2)
Total Protein: 7.3 g/dL (ref 6.5–8.1)

## 2021-03-25 LAB — CBC WITH DIFFERENTIAL/PLATELET
Abs Immature Granulocytes: 0.03 10*3/uL (ref 0.00–0.07)
Basophils Absolute: 0 10*3/uL (ref 0.0–0.1)
Basophils Relative: 0 %
Eosinophils Absolute: 0.2 10*3/uL (ref 0.0–0.5)
Eosinophils Relative: 2 %
HCT: 48 % (ref 39.0–52.0)
Hemoglobin: 15.2 g/dL (ref 13.0–17.0)
Immature Granulocytes: 0 %
Lymphocytes Relative: 14 %
Lymphs Abs: 1.1 10*3/uL (ref 0.7–4.0)
MCH: 28.1 pg (ref 26.0–34.0)
MCHC: 31.7 g/dL (ref 30.0–36.0)
MCV: 88.7 fL (ref 80.0–100.0)
Monocytes Absolute: 1.1 10*3/uL — ABNORMAL HIGH (ref 0.1–1.0)
Monocytes Relative: 14 %
Neutro Abs: 5.5 10*3/uL (ref 1.7–7.7)
Neutrophils Relative %: 70 %
Platelets: 245 10*3/uL (ref 150–400)
RBC: 5.41 MIL/uL (ref 4.22–5.81)
RDW: 18.2 % — ABNORMAL HIGH (ref 11.5–15.5)
WBC: 7.9 10*3/uL (ref 4.0–10.5)
nRBC: 0 % (ref 0.0–0.2)

## 2021-03-25 LAB — MAGNESIUM: Magnesium: 1.9 mg/dL (ref 1.7–2.4)

## 2021-03-25 NOTE — Progress Notes (Signed)
Logan Alvarez, [place] [id number]   CLINIC:  Medical Oncology/Hematology  PCP:  Kathyrn Drown, MD [address] [place] / [place] [address] [phone]   REASON FOR VISIT:  Follow-up for hepatocellular carcinoma  PRIOR THERAPY: none  NGS Results: not done  CURRENT THERAPY: Atezolizumab + Bevacizumab q21d Maintenance  BRIEF ONCOLOGIC HISTORY:  Oncology History  Hepatocellular carcinoma ([place])  11/12/2017 Initial Diagnosis   Hepatocellular carcinoma ([place])   07/29/2020 Cancer Staging   Staging form: Liver, AJCC 8th Edition - Clinical stage from 07/29/2020: Stage IVB (cT1a, cN0, pM1) - Signed by Orson Slick, MD on 07/29/2020 Stage prefix: Initial diagnosis Histologic grade (G): G3 Histologic grading system: 4 grade system    08/15/2020 -  Chemotherapy   Patient is on Treatment Plan : [place] Atezolizumab + Bevacizumab q21d Maintenance       CANCER STAGING: Cancer Staging  Hepatocellular carcinoma ([place]) Staging form: Liver, AJCC 8th Edition - Clinical stage from 07/29/2020: Stage IVB (cT1a, cN0, pM1) - Signed by Orson Slick, MD on 07/29/2020   INTERVAL HISTORY:  Logan Alvarez, a 66 y.o. male, returns for routine follow-up of his hepatocellular carcinoma. Logan Alvarez was last seen on 03/13/2021.   Today he reports feeling well. He reports a Covid infection 1 month ago. His joint pains have improved since starting a 1/2 tablet of prednisone daily. His energy has improved, and his eating has improved. He reports Boost occasionally causes nausea.    REVIEW OF SYSTEMS:  Review of Systems  Constitutional:  Negative for appetite change and fatigue.  Gastrointestinal:  Positive for blood in stool (hemorrhoid), diarrhea and nausea.  Musculoskeletal:  Positive for arthralgias (L shoulder 8/10).  Psychiatric/Behavioral:  Positive for sleep disturbance.   All other systems reviewed and are negative.  PAST MEDICAL/SURGICAL HISTORY:   Past Medical History:  Diagnosis Date   CAD (coronary artery disease)    a. 01/2013: cath showing nonobstructive disease. b. 11/2020: NSTEMI with DES to mid-LCx and medical management recommended of distal LCx disease given small vessel size   Hepatocellular carcinoma ([place])    Stage IV   Hyperlipidemia    Hypertension    Testosterone deficiency 2009   Past Surgical History:  Procedure Laterality Date   BALLOON DILATION  07/30/2020   Procedure: BALLOON DILATION;  Surgeon: Eloise Harman, DO;  [facility];  Service: Endoscopy;;   COLONOSCOPY N/A 03/12/2017   Procedure: COLONOSCOPY;  Surgeon: Danie Binder, MD;  [facility];  Service: Endoscopy;  Laterality: N/A;  2:00   COLONOSCOPY WITH PROPOFOL N/A 06/18/2020   Procedure: COLONOSCOPY WITH PROPOFOL;  Surgeon: Eloise Harman, DO;  [facility];  Service: Endoscopy;  Laterality: N/A;  pm appt   CORONARY STENT INTERVENTION N/A 12/03/2020   Procedure: CORONARY STENT INTERVENTION;  Surgeon: Jettie Booze, MD;  Location: [facility];  Service: Cardiovascular;  Laterality: N/A;   ESOPHAGOGASTRODUODENOSCOPY (EGD) WITH PROPOFOL N/A 07/30/2020   Procedure: ESOPHAGOGASTRODUODENOSCOPY (EGD) WITH PROPOFOL;  Surgeon: Eloise Harman, DO;  [facility];  Service: Endoscopy;  Laterality: N/A;  11:45am   LEFT HEART CATH AND CORONARY ANGIOGRAPHY N/A 12/03/2020   Procedure: LEFT HEART CATH AND CORONARY ANGIOGRAPHY;  Surgeon: Jettie Booze, MD;  Location: [facility];  Service: Cardiovascular;  Laterality: N/A;   LEFT HEART CATHETERIZATION WITH CORONARY ANGIOGRAM N/A 02/01/2013   Procedure: LEFT HEART CATHETERIZATION WITH CORONARY  ANGIOGRAM;  Surgeon: Josue Hector, MD;  Location: [facility] CATH LAB;  Service: Cardiovascular;  Laterality: N/A;   NECK SURGERY     POLYPECTOMY  06/18/2020   Procedure: POLYPECTOMY;  Surgeon: Eloise Harman, DO;  [facility];  Service:  Endoscopy;;  snare and biospy    SOCIAL HISTORY:  Social History   Socioeconomic History   Marital status: Widowed    Spouse name: Not on file   Number of children: Not on file   Years of education: Not on file   Highest education level: Not on file  Occupational History   Not on file  Tobacco Use   Smoking status: Never   Smokeless tobacco: Never  Vaping Use   Vaping Use: Never used  Substance and Sexual Activity   Alcohol use: Not Currently    Comment: 4-6 beers a day   Drug use: No   Sexual activity: Not on file  Other Topics Concern   Not on file  Social History Narrative   Not on file   Social Determinants of Health   Financial Resource Strain: Not on file  Food Insecurity: Not on file  Transportation Needs: Not on file  Physical Activity: Not on file  Stress: Not on file  Social Connections: Not on file  Intimate Partner Violence: Not on file    FAMILY HISTORY:  Family History  Problem Relation Age of Onset   Heart attack Father        52's    CURRENT MEDICATIONS:  Current Outpatient Medications  Medication Sig Dispense Refill   ALPRAZolam (XANAX) 0.5 MG tablet Take one tablet po QID 120 tablet 4   aspirin EC 81 MG tablet Take 1 tablet (81 mg total) by mouth daily. Swallow whole. 30 tablet 11   dextromethorphan-guaiFENesin (ROBITUSSIN-DM) 10-100 MG/5ML liquid Take 5-10 mLs by mouth every 6 (six) hours as needed for cough.     lidocaine (XYLOCAINE) 2 % solution Use as directed 15 mLs in the mouth or throat every 4 (four) hours as needed for mouth pain. Swish and spit along with 15 mL of Carafate every 4 hours as needed for mild pain. 420 mL 0   losartan (COZAAR) 25 MG tablet Take 1 tablet (25 mg total) by mouth in the morning. 90 tablet 1   metoprolol tartrate (LOPRESSOR) 25 MG tablet Take 0.5 tablets (12.5 mg total) by mouth 2 (two) times daily. 30 tablet 5   nitroGLYCERIN (NITROSTAT) 0.4 MG SL tablet Place 1 tablet (0.4 mg total) under the tongue every 5  (five) minutes as needed for chest pain. 25 tablet 2   oxyCODONE (OXY IR/ROXICODONE) 5 MG immediate release tablet Take 1 tablet (5 mg total) by mouth every 4 (four) hours as needed (pain). 30 tablet 0   predniSONE (DELTASONE) 20 MG tablet Take 0.5 tablets (10 mg total) by mouth daily with breakfast. 30 tablet 1   pregabalin (LYRICA) 100 MG capsule Take 1 capsule (100 mg total) by mouth 3 (three) times daily. 90 capsule 3   sodium chloride (OCEAN) 0.65 % nasal spray 1 spray as needed for congestion.     sucralfate (CARAFATE) 1 GM/10ML suspension Take 10 mLs (1 g total) by mouth 4 (four) times daily -  with meals and at bedtime. Mix 10 mL of Carafate with 15 mL of lidocaine, swish and spit every 4 hours as needed for mild pain. 420 mL 0   ticagrelor (BRILINTA) 90 MG TABS tablet Take 1 tablet (90 mg  total) by mouth 2 (two) times daily. 14 tablet 0   Vitamin D, Ergocalciferol, (DRISDOL) 1.25 MG (50000 UNIT) CAPS capsule TAKE 1 CAPSULE BY MOUTH WEEKLY (Patient taking differently: Take 50,000 Units by mouth every Monday. TAKE 1 CAPSULE BY MOUTH WEEKLY) 4 capsule 2   No current facility-administered medications for this visit.    ALLERGIES:  Allergies  Allergen Reactions   Cymbalta [Duloxetine Hcl]     Bad dreams   Lodine [Etodolac] Nausea And Vomiting   Zoloft [Sertraline Hcl]     REDUCED URINARY FLOW,NOCTURIA    PHYSICAL EXAM:  Performance status (ECOG): 1 - Symptomatic but completely ambulatory  There were no vitals filed for this visit. Wt Readings from Last 3 Encounters:  03/13/21 191 lb 4.8 oz (86.8 kg)  03/12/21 194 lb (88 kg)  03/04/21 199 lb 3.2 oz (90.4 kg)   Physical Exam Vitals reviewed.  Constitutional:      Appearance: Normal appearance.  Cardiovascular:     Rate and Rhythm: Normal rate and regular rhythm.     Pulses: Normal pulses.     Heart sounds: Normal heart sounds.  Pulmonary:     Effort: Pulmonary effort is normal.     Breath sounds: Normal breath sounds.   Neurological:     General: No focal deficit present.     Mental Status: He is alert and oriented to person, place, and time.  Psychiatric:        Mood and Affect: Mood normal.        Behavior: Behavior normal.     LABORATORY DATA:  I have reviewed the labs as listed.  CBC Latest Ref Rng & Units 03/13/2021 02/20/2021 02/12/2021  WBC 4.0 - 10.5 K/uL 7.3 9.8 8.8  Hemoglobin 13.0 - 17.0 g/dL 14.4 15.0 14.9  Hematocrit 39.0 - 52.0 % 44.2 45.1 45.3  Platelets 150 - 400 K/uL 280 345 326   CMP Latest Ref Rng & Units 03/13/2021 02/20/2021 02/12/2021  Glucose 70 - 99 mg/dL 119(H) 120(H) 153(H)  BUN 8 - 23 mg/dL 9 9 9   Creatinine 0.61 - 1.24 mg/dL 0.82 0.75 0.66  Sodium 135 - 145 mmol/L 132(L) 133(L) 131(L)  Potassium 3.5 - 5.1 mmol/L 3.6 4.0 4.0  Chloride 98 - 111 mmol/L 100 98 97(L)  CO2 22 - 32 mmol/L 24 26 26   Calcium 8.9 - 10.3 mg/dL 9.4 9.6 9.6  Total Protein 6.5 - 8.1 g/dL 7.8 8.1 -  Total Bilirubin 0.3 - 1.2 mg/dL 2.0(H) 1.2 -  Alkaline Phos 38 - 126 U/L 268(H) 103 -  AST 15 - 41 U/L 786(H) 93(H) -  ALT 0 - 44 U/L 967(H) 121(H) -    DIAGNOSTIC IMAGING:  I have independently reviewed the scans and discussed with the patient. CT CHEST ABDOMEN PELVIS W CONTRAST  Result Date: 03/24/2021 CLINICAL DATA:  History of hepatocellular carcinoma, peritoneal metastatic disease EXAM: CT CHEST, ABDOMEN, AND PELVIS WITH CONTRAST TECHNIQUE: Multidetector CT imaging of the chest, abdomen and pelvis was performed following the standard protocol during bolus administration of intravenous contrast. RADIATION DOSE REDUCTION: This exam was performed according to the departmental dose-optimization program which includes automated exposure control, adjustment of the mA and/or kV according to patient size and/or use of iterative reconstruction technique. CONTRAST:  162mL OMNIPAQUE IOHEXOL 300 MG/ML SOLN, additional oral enteric contrast COMPARISON:  CT abdomen pelvis, 01/23/2021, CT chest abdomen pelvis, 10/07/2020  FINDINGS: CT CHEST FINDINGS Cardiovascular: Aortic atherosclerosis. Normal heart size. Three-vessel coronary artery calcifications and/or stents. No pericardial effusion.  Mediastinum/Nodes: No enlarged mediastinal, hilar, or axillary lymph nodes. Thyroid gland, trachea, and esophagus demonstrate no significant findings. Lungs/Pleura: A small subsolid nodule of the anterior left upper lobe has enlarged, measuring 0.5 cm, previously no greater than 0.2 cm (series 6, image 47). Additional small nodules of the dependent left lower lobe measuring 0.3 cm and smaller are unchanged (series 6, image 106). No pleural effusion or pneumothorax. Musculoskeletal: No chest wall mass or suspicious osseous lesions identified. CT ABDOMEN PELVIS FINDINGS Hepatobiliary: Coarse, nodular, cirrhotic morphology of the liver. Unchanged subcentimeter low-attenuation lesions of the left and right lobe of the liver (series 2, image 53, 62). Status post cholecystectomy. No biliary ductal dilatation. Pancreas: Unremarkable. No pancreatic ductal dilatation or surrounding inflammatory changes. Spleen: Normal in size without significant abnormality. Adrenals/Urinary Tract: Adrenal glands are unremarkable. Kidneys are normal, without renal calculi, solid lesion, or hydronephrosis. Bladder is unremarkable. Stomach/Bowel: Stomach is within normal limits. Appendix is not clearly visualized. No evidence of bowel wall thickening, distention, or inflammatory changes. Vascular/Lymphatic: Aortic atherosclerosis. No enlarged abdominal or pelvic lymph nodes. Reproductive: No mass or other abnormality. Other: No abdominal wall hernia or abnormality. No ascites. Diffuse omental and mesenteric stranding and nodularity as well as diffuse peritoneal thickening is not significantly changed (series 2, image 80, 64). Musculoskeletal: No acute osseous findings. Unchanged wedge deformity of L1 (series 4, image 120). IMPRESSION: 1. A small subsolid nodule of the  anterior left upper lobe has enlarged, measuring 0.5 cm, previously no greater than 0.2 cm. This is nonspecific, modestly concerning for metastatic disease although potentially infectious or inflammatory. Attention on follow-up. Additional small nodules of the dependent left lower lobe measuring 0.3 cm and smaller are unchanged and most likely benign, incidental sequelae of prior infection or inflammation. 2. Diffuse omental and mesenteric stranding and nodularity as well as diffuse peritoneal thickening is not significantly changed, consistent with unchanged peritoneal carcinomatosis. 3. Unchanged subcentimeter low-attenuation lesions of the left and right lobe of the liver. Attention on follow-up. 4. Cirrhosis. 5. Coronary artery disease. Aortic Atherosclerosis (ICD10-I70.0). Electronically Signed   By: Delanna Ahmadi M.D.   On: 03/24/2021 14:47   MYOCARDIAL PERFUSION IMAGING  Result Date: 03/12/2021   Findings are consistent with no prior ischemia. The study is low risk.   No ST deviation was noted.   LV perfusion is abnormal. Defect 1: There is a small defect with mild reduction in uptake present in the mid to basal inferior location(s) that is fixed. There is normal wall motion in the defect area. Consistent with artifact caused by subdiaphragmatic activity.   Left ventricular function is normal. Nuclear stress EF: 64 %. The left ventricular ejection fraction is normal (55-65%). End diastolic cavity size is normal. End systolic cavity size is normal.   Prior study not available for comparison. Small sized, mild intensity fixed basal to mid inferior perfusion defect, likely artifact. No reversible ischemia. LVEF 64% with normal wall motion. This is a low risk study. No prior for comparison.     ASSESSMENT:  Hepatocellular carcinoma with peritoneal carcinomatosis: - Liver segment 5/6 partial hepatectomy on 12/08/2017-poorly differentiated hepatocellular carcinoma, 3 cm.  T1BN X- margins. - CT pancreatic  [facility] on [facility]/25/[facility] showed nodular studding and thickening of the ventral mesenteric fat suspicious for peritoneal carcinomatosis.  New pancreatic body lesion with pancreatic duct dilatation as well as right lateral abdominal wall deposit.  Found to have elevated CEA and CA 19-9. - Ultrasound-guided FNA of the right lateral abdominal wall soft tissue  lesion on 06/26/2020. - Pathology consistent with metastatic [place]. - Evaluated by Dr. Lorenso Courier and started on Atezolizumab and bevacizumab on 08/15/2020. - Restaging scan on 12/05/2020 with persistent omental thickening with evidence of peritoneal metastatic disease, minimally changed since 10/04/2020.  Persistent superficial nodule along the right lateral abdomen. - We have reviewed CT of the abdomen from 01/23/2021. - There is interval progression in the hazy/nodular soft tissue thickening throughout the small bowel mesentery, omentum and paracolic gutters with no free fluid.  Cirrhosis, with subcentimeter low-attenuation lesions in the liver, too small to characterize.   Social/family history: - Lives at home.  Son lives with him.  He worked as an Pharmacist, hospital on disability currently.  Non-smoker.  Last drank alcohol last month. - Sister had bone cancer.   PLAN:  Metastatic HCC with peritoneal carcinomatosis: - We have reviewed CT CAP from 03/21/2021.  It showed a small nodule in the left upper lobe measuring 0.5 cm nonspecific.  Diffuse omental and mesenteric stranding and nodularity as well as diffuse peritoneal thickening is not significantly changed consistent with unchanged peritoneal carcinomatosis.  Unchanged subcentimeter low-attenuation lesion of the left and right off of the liver.  This was compared to scans from 01/24/2019 to 10/07/2020. - His recent tumor marker CA 19-9 improved to 10,500 from 13,000.  CEA also improved to 57 from 69. - Reviewed labs today which showed elevated AST and ALT.  CBC was normal. -  Recommend holding Tecentriq which could be contributing to transaminitis.  We will continue bevacizumab today.  RTC 3 weeks for follow-up with labs and possible treatment.  2.  Constipation: - Continue Colace 2 tablets daily.  3.  Joint pain/myalgias: - He was started on prednisone on 02/20/2021. - He reports tremendous improvement in myalgias.  He is currently taking prednisone half tablet daily. - Continue prednisone 10 mg daily at this time. - Decrease Lyrica to 1 tablet every other day for 7 days and discontinue.  4.  Transaminitis: - Initial episode of transaminitis resolved after we held his Lipitor and Zetia.  However his labs on 03/13/2021 showed elevated AST and ALT. - LFTs today shows improved AST and ALT but still elevated. - Question immunotherapy related transaminitis.   Orders placed this encounter:  No orders of the defined types were placed in this encounter.    Derek Jack, MD [place] [phone]   I, Thana Ates, am acting as a scribe for Dr. Derek Jack.  I, Derek Jack MD, have reviewed the above documentation for accuracy and completeness, and I agree with the above.

## 2021-03-25 NOTE — Patient Instructions (Addendum)
[place] at [facility] Discharge Instructions   You were seen and examined today by Dr. Delton Coombes.  He reviewed the results of your lab work and scans which were normal/stable.  Continue prednisone 1/2 tablet daily.  Decrease Lyrica to every other day x 1 week then stop.  We will hold the Tecentriq (immunotherapy) for now as it may be causing your liver enzymes to be elevated. We will proceed with Avastin only for your treatment.        Thank you for choosing [place] at [facility] to provide your oncology and hematology care.  To afford each patient quality time with our provider, please arrive at least 15 minutes before your scheduled appointment time.   If you have a lab appointment with the [facility] please come in thru the Main Entrance and check in at the main information desk.  You need to re-schedule your appointment should you arrive 10 or more minutes late.  We strive to give you quality time with our providers, and arriving late affects you and other patients whose appointments are after yours.  Also, if you no show three or more times for appointments you may be dismissed from the clinic at the providers discretion.     Again, thank you for choosing [facility].  Our hope is that these requests will decrease the amount of time that you wait before being seen by our physicians.       _____________________________________________________________  Should you have questions after your visit to [facility], please contact our office at [phone] and follow the prompts.  Our office hours are 8:00 a.m. and 4:30 p.m. Monday - Friday.  Please note that voicemails left after 4:00 p.m. may not be returned until the following business day.  We are closed weekends and major holidays.  You do have access to a nurse 24-7, just call the main number to the clinic [phone] and do not press any options, hold  on the line and a nurse will answer the phone.    For prescription refill requests, have your pharmacy contact our office and allow 72 hours.    Due to Covid, you will need to wear a mask upon entering the hospital. If you do not have a mask, a mask will be given to you at the Main Entrance upon arrival. For doctor visits, patients may have 1 support person age 50 or older with them. For treatment visits, patients can not have anyone with them due to social distancing guidelines and our immunocompromised population.

## 2021-03-26 ENCOUNTER — Encounter: Payer: Self-pay | Admitting: Hematology and Oncology

## 2021-03-26 ENCOUNTER — Ambulatory Visit: Payer: BC Managed Care – PPO | Admitting: Cardiovascular Disease

## 2021-03-26 NOTE — Addendum Note (Signed)
Addended by: Derek Jack on: 03/26/2021 02:22 PM   Modules accepted: Orders

## 2021-03-27 ENCOUNTER — Encounter (HOSPITAL_COMMUNITY): Payer: Self-pay

## 2021-03-27 ENCOUNTER — Inpatient Hospital Stay (HOSPITAL_COMMUNITY): Payer: BC Managed Care – PPO

## 2021-03-27 ENCOUNTER — Other Ambulatory Visit: Payer: Self-pay

## 2021-03-27 VITALS — BP 123/67 | HR 66 | Temp 97.4°F | Resp 18 | Wt 194.0 lb

## 2021-03-27 DIAGNOSIS — Z5111 Encounter for antineoplastic chemotherapy: Secondary | ICD-10-CM | POA: Diagnosis not present

## 2021-03-27 DIAGNOSIS — C220 Liver cell carcinoma: Secondary | ICD-10-CM | POA: Diagnosis not present

## 2021-03-27 DIAGNOSIS — I10 Essential (primary) hypertension: Secondary | ICD-10-CM | POA: Diagnosis not present

## 2021-03-27 DIAGNOSIS — M2550 Pain in unspecified joint: Secondary | ICD-10-CM | POA: Diagnosis not present

## 2021-03-27 DIAGNOSIS — C786 Secondary malignant neoplasm of retroperitoneum and peritoneum: Secondary | ICD-10-CM | POA: Diagnosis not present

## 2021-03-27 DIAGNOSIS — K5900 Constipation, unspecified: Secondary | ICD-10-CM | POA: Diagnosis not present

## 2021-03-27 DIAGNOSIS — R7401 Elevation of levels of liver transaminase levels: Secondary | ICD-10-CM | POA: Diagnosis not present

## 2021-03-27 LAB — URINALYSIS, DIPSTICK ONLY
Bilirubin Urine: NEGATIVE
Glucose, UA: NEGATIVE mg/dL
Hgb urine dipstick: NEGATIVE
Ketones, ur: NEGATIVE mg/dL
Leukocytes,Ua: NEGATIVE
Nitrite: NEGATIVE
Protein, ur: NEGATIVE mg/dL
Specific Gravity, Urine: 1.008 (ref 1.005–1.030)
pH: 6 (ref 5.0–8.0)

## 2021-03-27 MED ORDER — SODIUM CHLORIDE 0.9 % IV SOLN: Freq: Once | INTRAVENOUS | Status: AC

## 2021-03-27 MED ORDER — SODIUM CHLORIDE 0.9 % IV SOLN
15.0000 mg/kg | Freq: Once | INTRAVENOUS | Status: AC
  Administered 2021-03-27: 1300 mg via INTRAVENOUS
  Filled 2021-03-27: qty 48

## 2021-03-27 NOTE — Patient Instructions (Signed)
[place]  Discharge Instructions: Thank you for choosing [facility] to provide your oncology and hematology care.  If you have a lab appointment with the [facility], please come in thru the Main Entrance and check in at the main information desk.  Wear comfortable clothing and clothing appropriate for easy access to any Portacath or PICC line.   We strive to give you quality time with your provider. You may need to reschedule your appointment if you arrive late (15 or more minutes).  Arriving late affects you and other patients whose appointments are after yours.  Also, if you miss three or more appointments without notifying the office, you may be dismissed from the clinic at the providers discretion.      For prescription refill requests, have your pharmacy contact our office and allow 72 hours for refills to be completed.    Today you received the following chemotherapy and/or immunotherapy agents Avastin, return as scheduled.  To help prevent nausea and vomiting after your treatment, we encourage you to take your nausea medication as directed.  BELOW ARE SYMPTOMS THAT SHOULD BE REPORTED IMMEDIATELY: *FEVER GREATER THAN 100.4 F (38 C) OR HIGHER *CHILLS OR SWEATING *NAUSEA AND VOMITING THAT IS NOT CONTROLLED WITH YOUR NAUSEA MEDICATION *UNUSUAL SHORTNESS OF BREATH *UNUSUAL BRUISING OR BLEEDING *URINARY PROBLEMS (pain or burning when urinating, or frequent urination) *BOWEL PROBLEMS (unusual diarrhea, constipation, pain near the anus) TENDERNESS IN MOUTH AND THROAT WITH OR WITHOUT PRESENCE OF ULCERS (sore throat, sores in mouth, or a toothache) UNUSUAL RASH, SWELLING OR PAIN  UNUSUAL VAGINAL DISCHARGE OR ITCHING   Items with * indicate a potential emergency and should be followed up as soon as possible or go to the Emergency Department if any problems should occur.  Please show the CHEMOTHERAPY ALERT CARD or IMMUNOTHERAPY ALERT CARD at check-in to the  Emergency Department and triage nurse.  Should you have questions after your visit or need to cancel or reschedule your appointment, please contact [facility] [phone]  and follow the prompts.  Office hours are 8:00 a.m. to 4:30 p.m. Monday - Friday. Please note that voicemails left after 4:00 p.m. may not be returned until the following business day.  We are closed weekends and major holidays. You have access to a nurse at all times for urgent questions. Please call the main number to the clinic [phone] and follow the prompts.  For any non-urgent questions, you may also contact your provider using MyChart. We now offer e-Visits for anyone 59 and older to request care online for non-urgent symptoms. For details visit [email or web address].   Also download the MyChart app! Go to the app store, search "MyChart", open the app, select [facility], and log in with your MyChart username and password.  Due to Covid, a mask is required upon entering the hospital/clinic. If you do not have a mask, one will be given to you upon arrival. For doctor visits, patients may have 1 support person aged 87 or older with them. For treatment visits, patients cannot have anyone with them due to current Covid guidelines and our immunocompromised population.

## 2021-03-27 NOTE — Progress Notes (Signed)
Patient presents today for Avastin. Patient informed that a UA needs to be collected. Patient states he sometimes will not be able to urinate for hours. Dr. Delton Coombes made aware. Dr. Delton Coombes okay with continue treatment today, patient needs to try to get UA today, if not bring a sample in tomorrow. Pharmacy made aware.  Patient tolerated therapy with no complaints voiced. Side effects with management reviewed with understanding verbalized. IV site clean and dry with no bruising or swelling noted at site. Good blood return noted before and after administration of therapy. Band aid applied. Patient was able to collect a UA before leaving clinic today, sample sent to lab. Patient left in satisfactory condition with VSS and no s/s of distress noted.

## 2021-03-28 ENCOUNTER — Encounter: Payer: Self-pay | Admitting: Hematology and Oncology

## 2021-03-31 ENCOUNTER — Encounter: Payer: Self-pay | Admitting: Family Medicine

## 2021-04-01 ENCOUNTER — Encounter (HOSPITAL_COMMUNITY): Payer: Self-pay | Admitting: Emergency Medicine

## 2021-04-01 ENCOUNTER — Encounter (HOSPITAL_COMMUNITY): Payer: Self-pay

## 2021-04-01 NOTE — Progress Notes (Signed)
Short term disability completed and faxed to [facility] [phone].  Conformation number [id number]. Sent to HIM.

## 2021-04-01 NOTE — Progress Notes (Signed)
Call placed to pt in regards to his short term disability.  Pt states that [employer] was suppose to fax the cancer [facility] extension paperwork.  Explained that we have not received any documents for him from [employer]. RN provided direct number and fax number. Pt appreciative of the call.

## 2021-04-02 ENCOUNTER — Other Ambulatory Visit: Payer: Self-pay

## 2021-04-02 ENCOUNTER — Ambulatory Visit (INDEPENDENT_AMBULATORY_CARE_PROVIDER_SITE_OTHER): Payer: Medicare Other | Admitting: Internal Medicine

## 2021-04-02 VITALS — BP 118/72 | HR 70 | Temp 97.3°F | Ht 70.0 in | Wt 197.6 lb

## 2021-04-02 DIAGNOSIS — K625 Hemorrhage of anus and rectum: Secondary | ICD-10-CM | POA: Diagnosis not present

## 2021-04-02 DIAGNOSIS — K6289 Other specified diseases of anus and rectum: Secondary | ICD-10-CM

## 2021-04-02 DIAGNOSIS — R1319 Other dysphagia: Secondary | ICD-10-CM | POA: Diagnosis not present

## 2021-04-02 NOTE — Patient Instructions (Signed)
You have 2 external hemorrhoids, one is quite irritated. ? ?I am going to send you in Anusol cream to use twice daily. ? ?For your constipation, I want you to start taking over the counter MiraLAX 1 capful daily.  If this does not adequately control your constipation, I would increase to 2 capfuls daily.  If this is still not adequate, then I would add on once daily Dulcolax (bisacodyl) tablet. ?  ?I also recommend increasing fiber in your diet or adding OTC Benefiber/Metamucil. Be sure to drink at least 4 to 6 glasses of water daily.  ? ?Would recommend attempting sitz bath's as well. ? ?Follow-up in 6 weeks. ? ?It was nice seeing you again today. ? ?Dr. Abbey Chatters ? ?At [facility] we value your feedback. You may receive a survey about your visit today. Please share your experience as we strive to create trusting relationships with our patients to provide genuine, compassionate, quality care. ? ?We appreciate your understanding and patience as we review any laboratory studies, imaging, and other diagnostic tests that are ordered as we care for you. Our office policy is 5 business days for review of these results, and any emergent or urgent results are addressed in a timely manner for your best interest. If you do not hear from our office in 1 week, please contact us.  ? ?We also encourage the use of MyChart, which contains your medical information for your review as well. If you are not enrolled in this feature, an access code is on this after visit summary for your convenience. Thank you for allowing [place] to be involved in your care. ? ?It was great to see you today!  I hope you have a great rest of your Winter! ? ? ? ?Logan Alvarez. Abbey Chatters, D.O. ?Gastroenterology and Hepatology ?[facility] ? ? ?

## 2021-04-02 NOTE — Progress Notes (Signed)
Primary Care Physician:  Kathyrn Drown, MD Primary Gastroenterologist:  Dr. Abbey Chatters  Chief Complaint  Patient presents with   Blood In Stools    When having a BM blood is seen and pain level is a 10 after BM's   Hemorrhoids    HPI:   Logan Alvarez is a 66 y.o. male who presents to the clinic today for follow-up visit.  Patient had history of hepatocellular carcinoma status post resection.  Now with recurrence of his disease on need therapy.  Followed by Dr. Raliegh Ip of oncology.  Underwent EGD June 2022 for variceal screening.  Found to have severe Schatzki's ring dilated with 12 mm balloon.  Recommended repeat EGD though patient did not have this done.  States his dysphagia is okay as long as he cuts his food up into small bites.  Not interested in repeat EGD at this time.  Main complaint for me today is rectal pain and bleeding.  Primarily with bowel movements.  Concerned about hemorrhoid.  Past Medical History:  Diagnosis Date   CAD (coronary artery disease)    a. 01/2013: cath showing nonobstructive disease. b. 11/2020: NSTEMI with DES to mid-LCx and medical management recommended of distal LCx disease given small vessel size   Hepatocellular carcinoma ([place])    Stage IV   Hyperlipidemia    Hypertension    Testosterone deficiency 2009    Past Surgical History:  Procedure Laterality Date   BALLOON DILATION  07/30/2020   Procedure: BALLOON DILATION;  Surgeon: Eloise Harman, DO;  [facility];  Service: Endoscopy;;   COLONOSCOPY N/A 03/12/2017   Procedure: COLONOSCOPY;  Surgeon: Danie Binder, MD;  [facility];  Service: Endoscopy;  Laterality: N/A;  2:00   COLONOSCOPY WITH PROPOFOL N/A 06/18/2020   Procedure: COLONOSCOPY WITH PROPOFOL;  Surgeon: Eloise Harman, DO;  [facility];  Service: Endoscopy;  Laterality: N/A;  pm appt   CORONARY STENT INTERVENTION N/A 12/03/2020   Procedure: CORONARY STENT INTERVENTION;  Surgeon: Jettie Booze, MD;  Location: [place] CV LAB;  Service: Cardiovascular;  Laterality: N/A;   ESOPHAGOGASTRODUODENOSCOPY (EGD) WITH PROPOFOL N/A 07/30/2020   Procedure: ESOPHAGOGASTRODUODENOSCOPY (EGD) WITH PROPOFOL;  Surgeon: Eloise Harman, DO;  [facility];  Service: Endoscopy;  Laterality: N/A;  11:45am   LEFT HEART CATH AND CORONARY ANGIOGRAPHY N/A 12/03/2020   Procedure: LEFT HEART CATH AND CORONARY ANGIOGRAPHY;  Surgeon: Jettie Booze, MD;  Location: [place] CV LAB;  Service: Cardiovascular;  Laterality: N/A;   LEFT HEART CATHETERIZATION WITH CORONARY ANGIOGRAM N/A 02/01/2013   Procedure: LEFT HEART CATHETERIZATION WITH CORONARY ANGIOGRAM;  Surgeon: Josue Hector, MD;  Location: [facility] CATH LAB;  Service: Cardiovascular;  Laterality: N/A;   NECK SURGERY     POLYPECTOMY  06/18/2020   Procedure: POLYPECTOMY;  Surgeon: Eloise Harman, DO;  [facility];  Service: Endoscopy;;  snare and biospy    Current Outpatient Medications  Medication Sig Dispense Refill   ALPRAZolam (XANAX) 0.5 MG tablet Take one tablet po QID 120 tablet 4   aspirin EC 81 MG tablet Take 1 tablet (81 mg total) by mouth daily. Swallow whole. 30 tablet 11   losartan (COZAAR) 25 MG tablet Take 1 tablet (25 mg total) by mouth in the morning. 90 tablet 1   metoprolol tartrate (LOPRESSOR) 25 MG tablet Take 0.5 tablets (12.5 mg total) by mouth 2 (two) times daily. 30 tablet 5  nitroGLYCERIN (NITROSTAT) 0.4 MG SL tablet Place 1 tablet (0.4 mg total) under the tongue every 5 (five) minutes as needed for chest pain. 25 tablet 2   oxyCODONE (OXY IR/ROXICODONE) 5 MG immediate release tablet Take 1 tablet (5 mg total) by mouth every 4 (four) hours as needed (pain). 30 tablet 0   predniSONE (DELTASONE) 20 MG tablet Take 0.5 tablets (10 mg total) by mouth daily with breakfast. 30 tablet 1   dextromethorphan-guaiFENesin (ROBITUSSIN-DM) 10-100 MG/5ML liquid Take 5-10 mLs by mouth every 6 (six) hours as needed for  cough.     lidocaine (XYLOCAINE) 2 % solution Use as directed 15 mLs in the mouth or throat every 4 (four) hours as needed for mouth pain. Swish and spit along with 15 mL of Carafate every 4 hours as needed for mild pain. (Patient not taking: Reported on 04/02/2021) 420 mL 0   pregabalin (LYRICA) 100 MG capsule Take 1 capsule (100 mg total) by mouth 3 (three) times daily. (Patient not taking: Reported on 04/02/2021) 90 capsule 3   sodium chloride (OCEAN) 0.65 % nasal spray 1 spray as needed for congestion.     sucralfate (CARAFATE) 1 GM/10ML suspension Take 10 mLs (1 g total) by mouth 4 (four) times daily -  with meals and at bedtime. Mix 10 mL of Carafate with 15 mL of lidocaine, swish and spit every 4 hours as needed for mild pain. 420 mL 0   ticagrelor (BRILINTA) 90 MG TABS tablet Take 1 tablet (90 mg total) by mouth 2 (two) times daily. 14 tablet 0   Vitamin D, Ergocalciferol, (DRISDOL) 1.25 MG (50000 UNIT) CAPS capsule TAKE 1 CAPSULE BY MOUTH WEEKLY (Patient taking differently: Take 50,000 Units by mouth every Monday. TAKE 1 CAPSULE BY MOUTH WEEKLY) 4 capsule 2   No current facility-administered medications for this visit.    Allergies as of 04/02/2021 - Review Complete 04/02/2021  Allergen Reaction Noted   Cymbalta [duloxetine hcl]  08/28/2016   Lodine [etodolac] Nausea And Vomiting 05/20/2012   Zoloft [sertraline hcl]  01/29/2017    Family History  Problem Relation Age of Onset   Heart attack Father        21's    Social History   Socioeconomic History   Marital status: Widowed    Spouse name: Not on file   Number of children: Not on file   Years of education: Not on file   Highest education level: Not on file  Occupational History   Not on file  Tobacco Use   Smoking status: Never   Smokeless tobacco: Never  Vaping Use   Vaping Use: Never used  Substance and Sexual Activity   Alcohol use: Not Currently    Comment: 4-6 beers a day   Drug use: No   Sexual activity: Not on  file  Other Topics Concern   Not on file  Social History Narrative   Not on file   Social Determinants of Health   Financial Resource Strain: Not on file  Food Insecurity: Not on file  Transportation Needs: Not on file  Physical Activity: Not on file  Stress: Not on file  Social Connections: Not on file  Intimate Partner Violence: Not on file    Subjective: Review of Systems  Constitutional:  Negative for chills and fever.  HENT:  Negative for congestion and hearing loss.   Eyes:  Negative for blurred vision and double vision.  Respiratory:  Negative for cough and shortness of breath.   Cardiovascular:  Negative for chest pain and palpitations.  Gastrointestinal:  Negative for abdominal pain, blood in stool, constipation, diarrhea, heartburn, melena and vomiting.  Genitourinary:  Negative for dysuria and urgency.  Musculoskeletal:  Negative for joint pain and myalgias.  Skin:  Negative for itching and rash.  Neurological:  Negative for dizziness and headaches.  Psychiatric/Behavioral:  Negative for depression. The patient is not nervous/anxious.       Objective: BP 118/72    Pulse 70    Temp (!) 97.3 F (36.3 C)    Ht 5\' 10"  (1.778 m)    Wt 197 lb 9.6 oz (89.6 kg)    BMI 28.35 kg/m  Physical Exam Constitutional:      Appearance: Normal appearance.  HENT:     Head: Normocephalic and atraumatic.  Eyes:     Extraocular Movements: Extraocular movements intact.     Conjunctiva/sclera: Conjunctivae normal.  Cardiovascular:     Rate and Rhythm: Normal rate and regular rhythm.  Pulmonary:     Effort: Pulmonary effort is normal.     Breath sounds: Normal breath sounds.  Abdominal:     General: Bowel sounds are normal.     Palpations: Abdomen is soft.  Musculoskeletal:        General: Normal range of motion.     Cervical back: Normal range of motion and neck supple.  Skin:    General: Skin is warm.  Neurological:     General: No focal deficit present.     Mental  Status: He is alert and oriented to person, place, and time.  Psychiatric:        Mood and Affect: Mood normal.        Behavior: Behavior normal.  Rectal exam: 2 external hemorrhoids noted in the 3:00 and 11 o'clock position.  Hemorrhoid in the 3 o'clock position irritated, evidence of recent bleeding.   Assessment: *Hepatocellular carcinoma status post resection now with recurrence-follows with Dr. Raliegh Ip of oncology *Rectal pain *Rectal bleeding *Esophageal dysphagia due to Schatzki's ring  Plan: Patient's rectal pain and bleeding likely due to hemorrhoids noted on rectal exam today.  We will send in Anusol cream to use twice daily.  Recommend he keep his bowels soft. I recommended taking MiraLAX 1 capful daily.  If this is not adequate then increase to 2 capfuls daily.  If this is still not adequate then add on once daily Dulcolax.  Also recommended patient start taking fiber therapy.  Print out given to patient today.  Encouraged to drink at least 6 glasses of water daily.  I have also recommended sitz bath's as well nightly.  Apply cream after bath.  Patient would like to hold off any further EGDs for now.  Recommended he continue to cut up his food into very small pieces.  Continue to follow with Dr. Raliegh Ip for his [facility].  Follow-up in 6 weeks.   04/02/2021 4:46 PM   Disclaimer: This note was dictated with voice recognition software. Similar sounding words can inadvertently be transcribed and may not be corrected upon review.

## 2021-04-03 ENCOUNTER — Telehealth: Payer: Self-pay | Admitting: Internal Medicine

## 2021-04-03 MED ORDER — HYDROCORTISONE (PERIANAL) 2.5 % EX CREA: 1.0000 "application " | TOPICAL_CREAM | Freq: Two times a day (BID) | CUTANEOUS | 1 refills | Status: DC

## 2021-04-03 NOTE — Addendum Note (Signed)
Addended by: Eloise Harman on: 04/03/2021 09:51 AM ? ? Modules accepted: Orders ? ?

## 2021-04-03 NOTE — Telephone Encounter (Signed)
Pt was made aware.  

## 2021-04-03 NOTE — Telephone Encounter (Signed)
Sent to pharmacy 

## 2021-04-03 NOTE — Telephone Encounter (Signed)
Pt is needing the Anusol called in to [facility] on [address]  ?

## 2021-04-03 NOTE — Telephone Encounter (Signed)
Patient called and said that he was supposed to have a cream called into [facility] on [address] but they have not received it  ?

## 2021-04-07 ENCOUNTER — Encounter (HOSPITAL_COMMUNITY): Payer: Self-pay | Admitting: Emergency Medicine

## 2021-04-07 ENCOUNTER — Encounter: Payer: Self-pay | Admitting: Hematology and Oncology

## 2021-04-07 NOTE — Progress Notes (Signed)
More information sent to [employer] for pts disability claim.  Faxed to [phone] (conformation number [id number]). ?

## 2021-04-09 ENCOUNTER — Encounter (HOSPITAL_COMMUNITY): Payer: Self-pay | Admitting: Emergency Medicine

## 2021-04-09 ENCOUNTER — Other Ambulatory Visit: Payer: Self-pay | Admitting: Oncology

## 2021-04-09 NOTE — Progress Notes (Signed)
[place] disablitly paperwork completed and faxed to [phone].  (Conformation number [id number]) ?Claim number [id number].  Sent to be scanned into chart.  ?

## 2021-04-17 ENCOUNTER — Inpatient Hospital Stay (HOSPITAL_BASED_OUTPATIENT_CLINIC_OR_DEPARTMENT_OTHER): Payer: Medicare Other | Admitting: Hematology

## 2021-04-17 ENCOUNTER — Other Ambulatory Visit: Payer: Self-pay

## 2021-04-17 ENCOUNTER — Inpatient Hospital Stay (HOSPITAL_COMMUNITY): Payer: Medicare Other | Attending: Hematology

## 2021-04-17 ENCOUNTER — Inpatient Hospital Stay (HOSPITAL_COMMUNITY): Payer: Medicare Other

## 2021-04-17 VITALS — BP 117/81 | HR 75 | Temp 97.0°F | Resp 18

## 2021-04-17 VITALS — BP 118/73 | HR 80 | Temp 97.6°F | Resp 18 | Ht 68.0 in | Wt 192.6 lb

## 2021-04-17 DIAGNOSIS — C220 Liver cell carcinoma: Secondary | ICD-10-CM

## 2021-04-17 DIAGNOSIS — Z5111 Encounter for antineoplastic chemotherapy: Secondary | ICD-10-CM | POA: Diagnosis present

## 2021-04-17 DIAGNOSIS — R7989 Other specified abnormal findings of blood chemistry: Secondary | ICD-10-CM | POA: Diagnosis not present

## 2021-04-17 DIAGNOSIS — K5900 Constipation, unspecified: Secondary | ICD-10-CM | POA: Insufficient documentation

## 2021-04-17 DIAGNOSIS — C786 Secondary malignant neoplasm of retroperitoneum and peritoneum: Secondary | ICD-10-CM | POA: Insufficient documentation

## 2021-04-17 DIAGNOSIS — K7460 Unspecified cirrhosis of liver: Secondary | ICD-10-CM | POA: Diagnosis not present

## 2021-04-17 DIAGNOSIS — R978 Other abnormal tumor markers: Secondary | ICD-10-CM | POA: Diagnosis not present

## 2021-04-17 DIAGNOSIS — Z79899 Other long term (current) drug therapy: Secondary | ICD-10-CM | POA: Diagnosis not present

## 2021-04-17 LAB — CBC WITH DIFFERENTIAL/PLATELET
Abs Immature Granulocytes: 0.03 10*3/uL (ref 0.00–0.07)
Basophils Absolute: 0 10*3/uL (ref 0.0–0.1)
Basophils Relative: 1 %
Eosinophils Absolute: 0.2 10*3/uL (ref 0.0–0.5)
Eosinophils Relative: 3 %
HCT: 47.6 % (ref 39.0–52.0)
Hemoglobin: 16 g/dL (ref 13.0–17.0)
Immature Granulocytes: 0 %
Lymphocytes Relative: 14 %
Lymphs Abs: 1.2 10*3/uL (ref 0.7–4.0)
MCH: 30.2 pg (ref 26.0–34.0)
MCHC: 33.6 g/dL (ref 30.0–36.0)
MCV: 90 fL (ref 80.0–100.0)
Monocytes Absolute: 1 10*3/uL (ref 0.1–1.0)
Monocytes Relative: 12 %
Neutro Abs: 6.3 10*3/uL (ref 1.7–7.7)
Neutrophils Relative %: 70 %
Platelets: 253 10*3/uL (ref 150–400)
RBC: 5.29 MIL/uL (ref 4.22–5.81)
RDW: 17.8 % — ABNORMAL HIGH (ref 11.5–15.5)
WBC: 8.9 10*3/uL (ref 4.0–10.5)
nRBC: 0 % (ref 0.0–0.2)

## 2021-04-17 LAB — COMPREHENSIVE METABOLIC PANEL
ALT: 196 U/L — ABNORMAL HIGH (ref 0–44)
AST: 135 U/L — ABNORMAL HIGH (ref 15–41)
Albumin: 3.6 g/dL (ref 3.5–5.0)
Alkaline Phosphatase: 123 U/L (ref 38–126)
Anion gap: 12 (ref 5–15)
BUN: 9 mg/dL (ref 8–23)
CO2: 21 mmol/L — ABNORMAL LOW (ref 22–32)
Calcium: 9.1 mg/dL (ref 8.9–10.3)
Chloride: 101 mmol/L (ref 98–111)
Creatinine, Ser: 0.59 mg/dL — ABNORMAL LOW (ref 0.61–1.24)
GFR, Estimated: >60 mL/min (ref >60)
Glucose, Bld: 142 mg/dL — ABNORMAL HIGH (ref 70–99)
Potassium: 3.9 mmol/L (ref 3.5–5.1)
Sodium: 134 mmol/L — ABNORMAL LOW (ref 135–145)
Total Bilirubin: 2.1 mg/dL — ABNORMAL HIGH (ref 0.3–1.2)
Total Protein: 7.2 g/dL (ref 6.5–8.1)

## 2021-04-17 LAB — MAGNESIUM: Magnesium: 2.1 mg/dL (ref 1.7–2.4)

## 2021-04-17 LAB — LACTATE DEHYDROGENASE: LDH: 144 U/L (ref 98–192)

## 2021-04-17 MED ORDER — SODIUM CHLORIDE 0.9 % IV SOLN: Freq: Once | INTRAVENOUS | Status: AC

## 2021-04-17 MED ORDER — SODIUM CHLORIDE 0.9 % IV SOLN
1300.0000 mg | Freq: Once | INTRAVENOUS | Status: AC
  Administered 2021-04-17: 1300 mg via INTRAVENOUS
  Filled 2021-04-17: qty 4

## 2021-04-17 NOTE — Progress Notes (Signed)
Ok to adjust Mvasi dose to new weight 87.4 kg. ? ?Dose = 15 mg/kg = 1300 mg rounded for vial size. ? ?T.O. Dr Rhys Martini, PharmD ?

## 2021-04-17 NOTE — Progress Notes (Signed)
Patient has been examined by Dr. Katragadda, and vital signs and labs have been reviewed. ANC, Creatinine, LFTs, hemoglobin, and platelets are within treatment parameters per M.D. - pt may proceed with treatment.    °

## 2021-04-17 NOTE — Progress Notes (Signed)
Patient presents today for bevacizumab infusion.  Patient is in satisfactory condition with no complaints voiced.  Vital signs are stable.  Labs reviewed by Dr. Delton Coombes during his office visit.  Liver functions are elevated.  MD aware.  All other labs are within treatment parameters.  We will proceed with treatment per MD orders.  ?

## 2021-04-17 NOTE — Patient Instructions (Signed)
[place] at [facility] ?Discharge Instructions ? ? ?You were seen and examined today by Dr. Delton Coombes. ? ?He reviewed your lab work.  Your liver enzymes are better.  We will hold Tecentriq again this time and treat with Avastin only. ? ?Cut the prednisone down to 1/4 tablet daily.  ? ?Return as scheduled in 3 weeks.  ? ? ?Thank you for choosing [place] at [facility] to provide your oncology and hematology care.  To afford each patient quality time with our provider, please arrive at least 15 minutes before your scheduled appointment time.  ? ?If you have a lab appointment with the [facility] please come in thru the Main Entrance and check in at the main information desk. ? ?You need to re-schedule your appointment should you arrive 10 or more minutes late.  We strive to give you quality time with our providers, and arriving late affects you and other patients whose appointments are after yours.  Also, if you no show three or more times for appointments you may be dismissed from the clinic at the providers discretion.     ?Again, thank you for choosing [facility].  Our hope is that these requests will decrease the amount of time that you wait before being seen by our physicians.       ?_____________________________________________________________ ? ?Should you have questions after your visit to [facility], please contact our office at [phone] and follow the prompts.  Our office hours are 8:00 a.m. and 4:30 p.m. Monday - Friday.  Please note that voicemails left after 4:00 p.m. may not be returned until the following business day.  We are closed weekends and major holidays.  You do have access to a nurse 24-7, just call the main number to the clinic [phone] and do not press any options, hold on the line and a nurse will answer the phone.   ? ?For prescription refill requests, have your pharmacy contact our office and allow  72 hours.   ? ?Due to Covid, you will need to wear a mask upon entering the hospital. If you do not have a mask, a mask will be given to you at the Main Entrance upon arrival. For doctor visits, patients may have 1 support person age 30 or older with them. For treatment visits, patients can not have anyone with them due to social distancing guidelines and our immunocompromised population.  ? ?   ?

## 2021-04-17 NOTE — Progress Notes (Signed)
? ?[name] ?[address] ?[place], [place] [address] ? ? ?CLINIC:  ?Medical Oncology/Hematology ? ?PCP:  ?Kathyrn Drown, MD ?[address] [place] [place] / [place] [address] ?[phone] ? ? ?REASON FOR VISIT:  ?Follow-up for hepatocellular carcinoma ? ?PRIOR THERAPY: none ? ?NGS Results: not done ? ?CURRENT THERAPY: Atezolizumab + Bevacizumab q21d Maintenance ? ?BRIEF ONCOLOGIC HISTORY:  ?Oncology History  ?Hepatocellular carcinoma ([place])  ?11/12/2017 Initial Diagnosis  ? Hepatocellular carcinoma ([place]) ?  ?07/29/2020 Cancer Staging  ? Staging form: Liver, AJCC 8th Edition ?- Clinical stage from 07/29/2020: Stage IVB (cT1a, cN0, pM1) - Signed by Orson Slick, MD on 07/29/2020 ?Stage prefix: Initial diagnosis ?Histologic grade (G): G3 ?Histologic grading system: 4 grade system ? ?  ?08/15/2020 -  Chemotherapy  ? Patient is on Treatment Plan : HCC Atezolizumab + Bevacizumab q21d Maintenance  ?   ? ? ?CANCER STAGING: ? Cancer Staging  ?Hepatocellular carcinoma ([place]) ?Staging form: Liver, AJCC 8th Edition ?- Clinical stage from 07/29/2020: Stage IVB (cT1a, cN0, pM1) - Signed by Orson Slick, MD on 07/29/2020 ? ? ?INTERVAL HISTORY:  ?Logan Alvarez, a 66 y.o. male, returns for routine follow-up and consideration for next cycle of chemotherapy. Marissa was last seen on 03/25/2021. ? ?Due for cycle #11 of Atezolizumab + Bevacizumab today.  ? ?Overall, he tells me he has been feeling pretty well. He denies bleeding other than occasional blood in mucous. He stopped Lyrica, and he continues to prednisone. He reports disrupted sleep. He reports left shoulder pain, and his other pains have resolved. He reports eating well.  ? ?Overall, he feels ready for next cycle of chemo today.  ? ?REVIEW OF SYSTEMS:  ?Review of Systems  ?Constitutional:  Negative for appetite change and fatigue.  ?HENT:   Positive for nosebleeds.   ?Respiratory:  Negative for hemoptysis.   ?Gastrointestinal:  Positive for abdominal pain  and constipation. Negative for blood in stool.  ?Genitourinary:  Negative for hematuria.   ?Musculoskeletal:  Positive for arthralgias (L shoulder).  ?Psychiatric/Behavioral:  Positive for sleep disturbance. The patient is nervous/anxious.   ?All other systems reviewed and are negative. ? ?PAST MEDICAL/SURGICAL HISTORY:  ?Past Medical History:  ?Diagnosis Date  ? CAD (coronary artery disease)   ? a. 01/2013: cath showing nonobstructive disease. b. 11/2020: NSTEMI with DES to mid-LCx and medical management recommended of distal LCx disease given small vessel size  ? Hepatocellular carcinoma ([place])   ? Stage IV  ? Hyperlipidemia   ? Hypertension   ? Testosterone deficiency 2009  ? ?Past Surgical History:  ?Procedure Laterality Date  ? BALLOON DILATION  07/30/2020  ? Procedure: BALLOON DILATION;  Surgeon: Eloise Harman, DO;  [facility];  Service: Endoscopy;;  ? COLONOSCOPY N/A 03/12/2017  ? Procedure: COLONOSCOPY;  Surgeon: Danie Binder, MD;  [facility];  Service: Endoscopy;  Laterality: N/A;  2:00  ? COLONOSCOPY WITH PROPOFOL N/A 06/18/2020  ? Procedure: COLONOSCOPY WITH PROPOFOL;  Surgeon: Eloise Harman, DO;  [facility];  Service: Endoscopy;  Laterality: N/A;  pm appt  ? CORONARY STENT INTERVENTION N/A 12/03/2020  ? Procedure: CORONARY STENT INTERVENTION;  Surgeon: Jettie Booze, MD;  Location: [place] CV LAB;  Service: Cardiovascular;  Laterality: N/A;  ? ESOPHAGOGASTRODUODENOSCOPY (EGD) WITH PROPOFOL N/A 07/30/2020  ? Procedure: ESOPHAGOGASTRODUODENOSCOPY (EGD) WITH PROPOFOL;  Surgeon: Eloise Harman, DO;  [facility];  Service: Endoscopy;  Laterality: N/A;  11:45am  ? LEFT HEART CATH AND CORONARY ANGIOGRAPHY N/A 12/03/2020  ? Procedure: LEFT HEART CATH AND CORONARY ANGIOGRAPHY;  Surgeon: Jettie Booze, MD;  Location: [place] CV LAB;  Service: Cardiovascular;  Laterality: N/A;  ? LEFT HEART CATHETERIZATION WITH CORONARY ANGIOGRAM N/A  02/01/2013  ? Procedure: LEFT HEART CATHETERIZATION WITH CORONARY ANGIOGRAM;  Surgeon: Josue Hector, MD;  Location: [facility] CATH LAB;  Service: Cardiovascular;  Laterality: N/A;  ? NECK SURGERY    ? POLYPECTOMY  06/18/2020  ? Procedure: POLYPECTOMY;  Surgeon: Eloise Harman, DO;  [facility];  Service: Endoscopy;;  snare and biospy  ? ? ?SOCIAL HISTORY:  ?Social History  ? ?Socioeconomic History  ? Marital status: Widowed  ?  Spouse name: Not on file  ? Number of children: Not on file  ? Years of education: Not on file  ? Highest education level: Not on file  ?Occupational History  ? Not on file  ?Tobacco Use  ? Smoking status: Never  ? Smokeless tobacco: Never  ?Vaping Use  ? Vaping Use: Never used  ?Substance and Sexual Activity  ? Alcohol use: Not Currently  ?  Comment: 4-6 beers a day  ? Drug use: No  ? Sexual activity: Not on file  ?Other Topics Concern  ? Not on file  ?Social History Narrative  ? Not on file  ? ?Social Determinants of Health  ? ?Financial Resource Strain: Not on file  ?Food Insecurity: Not on file  ?Transportation Needs: Not on file  ?Physical Activity: Not on file  ?Stress: Not on file  ?Social Connections: Not on file  ?Intimate Partner Violence: Not on file  ? ? ?FAMILY HISTORY:  ?Family History  ?Problem Relation Age of Onset  ? Heart attack Father   ?     73's  ? ? ?CURRENT MEDICATIONS:  ?Current Outpatient Medications  ?Medication Sig Dispense Refill  ? ALPRAZolam (XANAX) 0.5 MG tablet Take one tablet po QID 120 tablet 4  ? aspirin EC 81 MG tablet Take 1 tablet (81 mg total) by mouth daily. Swallow whole. 30 tablet 11  ? dextromethorphan-guaiFENesin (ROBITUSSIN-DM) 10-100 MG/5ML liquid Take 5-10 mLs by mouth every 6 (six) hours as needed for cough.    ? hydrocortisone (ANUSOL-HC) 2.5 % rectal cream Place 1 application rectally 2 (two) times daily. 30 g 1  ? lidocaine (XYLOCAINE) 2 % solution Use as directed 15 mLs in the mouth or throat every 4 (four) hours as needed for mouth  pain. Swish and spit along with 15 mL of Carafate every 4 hours as needed for mild pain. (Patient not taking: Reported on 04/02/2021) 420 mL 0  ? losartan (COZAAR) 25 MG tablet Take 1 tablet (25 mg total) by mouth in the morning. 90 tablet 1  ? metoprolol tartrate (LOPRESSOR) 25 MG tablet Take 0.5 tablets (12.5 mg total) by mouth 2 (two) times daily. 30 tablet 5  ? nitroGLYCERIN (NITROSTAT) 0.4 MG SL tablet Place 1 tablet (0.4 mg total) under the tongue every 5 (five) minutes as needed for chest pain. 25 tablet 2  ? oxyCODONE (OXY IR/ROXICODONE) 5 MG immediate release tablet Take 1 tablet (5 mg total) by mouth every 4 (four) hours as needed (pain). 30 tablet 0  ? predniSONE (DELTASONE) 20 MG tablet Take 0.5 tablets (10 mg total) by mouth daily with breakfast. 30 tablet 1  ? pregabalin (LYRICA) 100 MG capsule Take 1 capsule (100 mg total) by mouth 3 (three) times daily. (Patient not  taking: Reported on 04/02/2021) 90 capsule 3  ? sodium chloride (OCEAN) 0.65 % nasal spray 1 spray as needed for congestion.    ? sucralfate (CARAFATE) 1 GM/10ML suspension Take 10 mLs (1 g total) by mouth 4 (four) times daily -  with meals and at bedtime. Mix 10 mL of Carafate with 15 mL of lidocaine, swish and spit every 4 hours as needed for mild pain. 420 mL 0  ? ticagrelor (BRILINTA) 90 MG TABS tablet Take 1 tablet (90 mg total) by mouth 2 (two) times daily. 14 tablet 0  ? Vitamin D, Ergocalciferol, (DRISDOL) 1.25 MG (50000 UNIT) CAPS capsule TAKE 1 CAPSULE BY MOUTH WEEKLY (Patient taking differently: Take 50,000 Units by mouth every Monday. TAKE 1 CAPSULE BY MOUTH WEEKLY) 4 capsule 2  ? ?No current facility-administered medications for this visit.  ? ? ?ALLERGIES:  ?Allergies  ?Allergen Reactions  ? Cymbalta [Duloxetine Hcl]   ?  Bad dreams  ? Lodine [Etodolac] Nausea And Vomiting  ? Zoloft [Sertraline Hcl]   ?  REDUCED URINARY FLOW,NOCTURIA  ? ? ?PHYSICAL EXAM:  ?Performance status (ECOG): 1 - Symptomatic but completely  ambulatory ? ?There were no vitals filed for this visit. ?Wt Readings from Last 3 Encounters:  ?04/02/21 197 lb 9.6 oz (89.6 kg)  ?03/27/21 194 lb (88 kg)  ?03/25/21 194 lb 0.1 oz (88 kg)  ? ?Physical Exam ?Vitals re

## 2021-04-18 ENCOUNTER — Encounter: Payer: Self-pay | Admitting: Hematology

## 2021-04-18 LAB — CEA: CEA: 82.7 ng/mL — ABNORMAL HIGH (ref 0.0–4.7)

## 2021-04-24 LAB — CANCER ANTIGEN 19-9: CA 19-9: 27396 U/mL — ABNORMAL HIGH (ref 0–35)

## 2021-04-29 ENCOUNTER — Ambulatory Visit (INDEPENDENT_AMBULATORY_CARE_PROVIDER_SITE_OTHER): Payer: Medicare Other | Admitting: Family Medicine

## 2021-04-29 VITALS — BP 130/86 | HR 84 | Temp 97.3°F | Ht 68.0 in | Wt 192.8 lb

## 2021-04-29 DIAGNOSIS — K5900 Constipation, unspecified: Secondary | ICD-10-CM

## 2021-04-29 DIAGNOSIS — E559 Vitamin D deficiency, unspecified: Secondary | ICD-10-CM

## 2021-04-29 DIAGNOSIS — F419 Anxiety disorder, unspecified: Secondary | ICD-10-CM

## 2021-04-29 DIAGNOSIS — I25118 Atherosclerotic heart disease of native coronary artery with other forms of angina pectoris: Secondary | ICD-10-CM

## 2021-04-29 DIAGNOSIS — Z125 Encounter for screening for malignant neoplasm of prostate: Secondary | ICD-10-CM

## 2021-04-29 DIAGNOSIS — K068 Other specified disorders of gingiva and edentulous alveolar ridge: Secondary | ICD-10-CM | POA: Diagnosis not present

## 2021-04-29 DIAGNOSIS — I10 Essential (primary) hypertension: Secondary | ICD-10-CM

## 2021-04-29 MED ORDER — ALPRAZOLAM 0.5 MG PO TABS: ORAL_TABLET | ORAL | 4 refills | Status: AC

## 2021-04-29 MED ORDER — NYSTATIN 100000 UNIT/ML MT SUSP: OROMUCOSAL | 0 refills | Status: DC

## 2021-04-29 MED ORDER — OXYCODONE HCL 5 MG PO TABS: 5.0000 mg | ORAL_TABLET | ORAL | 0 refills | Status: DC | PRN

## 2021-04-29 NOTE — Progress Notes (Signed)
? ?  Subjective:  ? ? Patient ID: Logan Alvarez, male    DOB: 11/24/1955, 66 y.o.   MRN: [id number] ? ?HPI ? ?Patient here for 8 week follow up.  ?Patient does have chronic pain in his abdomen and back related into his cancer uses oxycodone sparingly is aware can cause drowsiness does not drive with it ? ?Patient being followed by cancer specialist and being treated currently he does state he is having soreness in his mouth and discomfort since being on chemotherapy but he is also on low-dose prednisone ? ?He does state his weight is gone down he is trying to eat healthy and trying to keep his weight steady he does relate a lot of fatigue and tiredness but denies any fevers ? ?He does have underlying anxiety conditions and uses Xanax 4 times a day I have encouraged him to try to taper that to 3 times per day to be healthier in regards to less medicine ? ?Primary hypertension ? ?Anxiety ? ?Vitamin D deficiency - Plan: Vitamin D (25 hydroxy) ? ?Pain in gums ? ?Constipation, unspecified constipation type ? ?Screening PSA (prostate specific antigen) - Plan: PSA ?He does relate intermittent constipation we did discuss the use of MiraLAX to try and help keep his bowels soft ? ?Review of Systems ? ?   ?Objective:  ? Physical Exam ? ?General-in no acute distress ?Eyes-no discharge ?Lungs-respiratory rate normal, CTA ?CV-no murmurs,RRR ?Extremities skin warm dry no edema ?Neuro grossly normal ?Behavior normal, alert ? ?I do not find any evidence of thrush but he has subjective discomfort in his gums as well as tongue and sides of his mouth ? ?   ?Assessment & Plan:  ? ?1. Primary hypertension ?Blood pressure decent control continue current measures ? ?2. Anxiety ?Refills of Xanax given encourage him to try to keep it at 3/day ? ?3. Vitamin D deficiency ?History of vitamin D deficiency recheck lab go-ahead with continuing vitamin D ?- Vitamin D (25 hydroxy) ? ?4. Pain in gums ?Nystatin oral solution recommended to help with  possible thrush discuss further with oncology when he sees him ? ?5. Constipation, unspecified constipation type ?MiraLAX on a regular basis to prevent constipation ? ?6. Screening PSA (prostate specific antigen) ?Due for screening PSA ?- PSA ?Chronic pain associated with his cancer oxycodone use sparingly refill given ?Follow-up 3 to 4 months ? ?

## 2021-05-06 ENCOUNTER — Telehealth: Payer: Self-pay | Admitting: Cardiovascular Disease

## 2021-05-06 ENCOUNTER — Encounter: Payer: Self-pay | Admitting: Hematology

## 2021-05-06 ENCOUNTER — Other Ambulatory Visit: Payer: Self-pay | Admitting: Cardiovascular Disease

## 2021-05-06 NOTE — Telephone Encounter (Signed)
This is a [facility] pt.  °

## 2021-05-06 NOTE — Telephone Encounter (Signed)
Spoke to pt and verbalized that there were samples of Brilinta 90 mg tablets and an application for the AZ&Me program at the front of the office for pick up. Pt voiced understanding ? ?Brilinta Sample: ?Lot #:[id number] ?Exp:06/02/2023 ? ? ?

## 2021-05-06 NOTE — Telephone Encounter (Signed)
Spoke with the pt and he says that he has about 6 days left of his Brilinta... he just switched his drug coverage to Medicare and it is well over 700.00 .  ? ? I will forward to our [place] office to see if they have any samples for him to hold him over and if they can help with a plan going forward to help him afford his meds.  ? ? ?

## 2021-05-06 NOTE — Telephone Encounter (Signed)
Pt c/o medication issue: ? ?1. Name of Medication: ticagrelor (BRILINTA) 90 MG TABS tablet ? ?2. How are you currently taking this medication (dosage and times per day)? Take 1 tablet (90 mg total) by mouth 2 (two) times daily. ? ?3. Are you having a reaction (difficulty breathing--STAT)? no ? ?4. What is your medication issue? Calling to see if there a less expensive medication that he can take. Please advise  ? ?

## 2021-05-07 ENCOUNTER — Encounter: Payer: Self-pay | Admitting: Hematology

## 2021-05-08 ENCOUNTER — Inpatient Hospital Stay (HOSPITAL_COMMUNITY): Payer: Medicare Other

## 2021-05-08 ENCOUNTER — Ambulatory Visit (INDEPENDENT_AMBULATORY_CARE_PROVIDER_SITE_OTHER): Payer: Medicare Other | Admitting: Internal Medicine

## 2021-05-08 ENCOUNTER — Encounter: Payer: Self-pay | Admitting: Internal Medicine

## 2021-05-08 ENCOUNTER — Inpatient Hospital Stay (HOSPITAL_COMMUNITY): Payer: Medicare Other | Attending: Hematology | Admitting: Hematology

## 2021-05-08 VITALS — BP 151/82 | HR 66 | Temp 96.7°F | Resp 18 | Ht 68.11 in | Wt 196.7 lb

## 2021-05-08 VITALS — BP 119/71 | HR 72 | Temp 97.1°F | Resp 16

## 2021-05-08 VITALS — BP 122/60 | HR 73 | Temp 97.1°F | Ht 68.0 in | Wt 197.4 lb

## 2021-05-08 DIAGNOSIS — K5900 Constipation, unspecified: Secondary | ICD-10-CM | POA: Insufficient documentation

## 2021-05-08 DIAGNOSIS — C220 Liver cell carcinoma: Secondary | ICD-10-CM

## 2021-05-08 DIAGNOSIS — R7401 Elevation of levels of liver transaminase levels: Secondary | ICD-10-CM | POA: Diagnosis not present

## 2021-05-08 DIAGNOSIS — K625 Hemorrhage of anus and rectum: Secondary | ICD-10-CM

## 2021-05-08 DIAGNOSIS — Z79899 Other long term (current) drug therapy: Secondary | ICD-10-CM

## 2021-05-08 DIAGNOSIS — Z5112 Encounter for antineoplastic immunotherapy: Secondary | ICD-10-CM | POA: Insufficient documentation

## 2021-05-08 DIAGNOSIS — K644 Residual hemorrhoidal skin tags: Secondary | ICD-10-CM

## 2021-05-08 DIAGNOSIS — C786 Secondary malignant neoplasm of retroperitoneum and peritoneum: Secondary | ICD-10-CM | POA: Insufficient documentation

## 2021-05-08 DIAGNOSIS — R978 Other abnormal tumor markers: Secondary | ICD-10-CM | POA: Diagnosis not present

## 2021-05-08 DIAGNOSIS — Z5111 Encounter for antineoplastic chemotherapy: Secondary | ICD-10-CM | POA: Insufficient documentation

## 2021-05-08 DIAGNOSIS — R1319 Other dysphagia: Secondary | ICD-10-CM

## 2021-05-08 DIAGNOSIS — R970 Elevated carcinoembryonic antigen [CEA]: Secondary | ICD-10-CM | POA: Insufficient documentation

## 2021-05-08 LAB — COMPREHENSIVE METABOLIC PANEL WITH GFR
ALT: 302 U/L — ABNORMAL HIGH (ref 0–44)
AST: 228 U/L — ABNORMAL HIGH (ref 15–41)
Albumin: 3.6 g/dL (ref 3.5–5.0)
Alkaline Phosphatase: 102 U/L (ref 38–126)
Anion gap: 12 (ref 5–15)
BUN: 7 mg/dL — ABNORMAL LOW (ref 8–23)
CO2: 23 mmol/L (ref 22–32)
Calcium: 9.6 mg/dL (ref 8.9–10.3)
Chloride: 101 mmol/L (ref 98–111)
Creatinine, Ser: 0.67 mg/dL (ref 0.61–1.24)
GFR, Estimated: >60 mL/min
Glucose, Bld: 149 mg/dL — ABNORMAL HIGH (ref 70–99)
Potassium: 4.1 mmol/L (ref 3.5–5.1)
Sodium: 136 mmol/L (ref 135–145)
Total Bilirubin: 1.6 mg/dL — ABNORMAL HIGH (ref 0.3–1.2)
Total Protein: 7.3 g/dL (ref 6.5–8.1)

## 2021-05-08 LAB — CBC WITH DIFFERENTIAL/PLATELET
Abs Immature Granulocytes: 0.04 10*3/uL (ref 0.00–0.07)
Basophils Absolute: 0 10*3/uL (ref 0.0–0.1)
Basophils Relative: 1 %
Eosinophils Absolute: 0.1 10*3/uL (ref 0.0–0.5)
Eosinophils Relative: 1 %
HCT: 45.7 % (ref 39.0–52.0)
Hemoglobin: 15.6 g/dL (ref 13.0–17.0)
Immature Granulocytes: 1 %
Lymphocytes Relative: 12 %
Lymphs Abs: 1 10*3/uL (ref 0.7–4.0)
MCH: 31 pg (ref 26.0–34.0)
MCHC: 34.1 g/dL (ref 30.0–36.0)
MCV: 90.7 fL (ref 80.0–100.0)
Monocytes Absolute: 0.9 10*3/uL (ref 0.1–1.0)
Monocytes Relative: 11 %
Neutro Abs: 6 10*3/uL (ref 1.7–7.7)
Neutrophils Relative %: 74 %
Platelets: 230 10*3/uL (ref 150–400)
RBC: 5.04 MIL/uL (ref 4.22–5.81)
RDW: 17.2 % — ABNORMAL HIGH (ref 11.5–15.5)
WBC: 8 10*3/uL (ref 4.0–10.5)
nRBC: 0 % (ref 0.0–0.2)

## 2021-05-08 LAB — URINALYSIS, DIPSTICK ONLY
Bilirubin Urine: NEGATIVE
Glucose, UA: NEGATIVE mg/dL
Hgb urine dipstick: NEGATIVE
Ketones, ur: NEGATIVE mg/dL
Leukocytes,Ua: NEGATIVE
Nitrite: NEGATIVE
Protein, ur: NEGATIVE mg/dL
Specific Gravity, Urine: 1.009 (ref 1.005–1.030)
pH: 5 (ref 5.0–8.0)

## 2021-05-08 LAB — TSH: TSH: 8.839 u[IU]/mL — ABNORMAL HIGH (ref 0.350–4.500)

## 2021-05-08 LAB — MAGNESIUM: Magnesium: 2.1 mg/dL (ref 1.7–2.4)

## 2021-05-08 MED ORDER — SODIUM CHLORIDE 0.9 % IV SOLN
15.0000 mg/kg | Freq: Once | INTRAVENOUS | Status: AC
  Administered 2021-05-08: 1300 mg via INTRAVENOUS
  Filled 2021-05-08: qty 48

## 2021-05-08 MED ORDER — SODIUM CHLORIDE 0.9% FLUSH: 10.0000 mL | INTRAVENOUS | Status: DC | PRN

## 2021-05-08 MED ORDER — SODIUM CHLORIDE 0.9 % IV SOLN
1200.0000 mg | Freq: Once | INTRAVENOUS | Status: AC
  Administered 2021-05-08: 1200 mg via INTRAVENOUS
  Filled 2021-05-08: qty 20

## 2021-05-08 MED ORDER — HYDROCORTISONE (PERIANAL) 2.5 % EX CREA: 1.0000 "application " | TOPICAL_CREAM | Freq: Two times a day (BID) | CUTANEOUS | 5 refills | Status: DC

## 2021-05-08 MED ORDER — SODIUM CHLORIDE 0.9 % IV SOLN: Freq: Once | INTRAVENOUS | Status: AC

## 2021-05-08 MED ORDER — PROCHLORPERAZINE MALEATE 10 MG PO TABS: 10.0000 mg | ORAL_TABLET | Freq: Four times a day (QID) | ORAL | 3 refills | Status: DC | PRN

## 2021-05-08 NOTE — Progress Notes (Signed)
? ?[facility] ?[address] ?[place], [place] [address] ? ? ?CLINIC:  ?Medical Oncology/Hematology ? ?PCP:  ?Kathyrn Drown, MD ?[place] / [place] [address] ?[phone] ? ? ?REASON FOR VISIT:  ?Follow-up for hepatocellular carcinoma with peritoneal carcinomatosis ? ?PRIOR THERAPY: none ? ?NGS Results: not done ? ?CURRENT THERAPY: Atezolizumab + Bevacizumab q21d Maintenance ? ?BRIEF ONCOLOGIC HISTORY:  ?Oncology History  ?Hepatocellular carcinoma ([name])  ?11/12/2017 Initial Diagnosis  ? Hepatocellular carcinoma ([name]) ?  ?07/29/2020 Cancer Staging  ? Staging form: Liver, AJCC 8th Edition ?- Clinical stage from 07/29/2020: Stage IVB (cT1a, cN0, pM1) - Signed by Orson Slick, MD on 07/29/2020 ?Stage prefix: Initial diagnosis ?Histologic grade (G): G3 ?Histologic grading system: 4 grade system ? ?  ?08/15/2020 -  Chemotherapy  ? Patient is on Treatment Plan : HCC Atezolizumab + Bevacizumab q21d Maintenance  ?   ? ? ?CANCER STAGING: ? Cancer Staging  ?Hepatocellular carcinoma ([name]) ?Staging form: Liver, AJCC 8th Edition ?- Clinical stage from 07/29/2020: Stage IVB (cT1a, cN0, pM1) - Signed by Orson Slick, MD on 07/29/2020 ? ? ?INTERVAL HISTORY:  ?[name], a 66 y.o. male, returns for routine follow-up and consideration for next cycle of chemotherapy. [name] was last seen on 04/17/2021. ? ?Due for cycle #12 of Atezolizumab + Bevacizumab today.  ? ?Overall, he tells me he has been feeling pretty well. His left shoulder pain has improved and is tolerable. He reports his gums are sore, swelling, and bleed when brushed or flossed; he was give nystatin which did not help. His appetite is poor. His weight is stable. He reports nausea and constipation.  ? ?Overall, he feels ready for next cycle of chemo today.  ? ?REVIEW OF SYSTEMS:  ?Review of Systems  ?Constitutional:  Positive for appetite change. Negative for fatigue and unexpected weight change.  ?HENT:     ?     6/10 Gum pain   ?Respiratory:  Positive for shortness of breath.   ?Gastrointestinal:  Positive for constipation, diarrhea and nausea.  ?Musculoskeletal:  Positive for arthralgias (L shoulder).  ?Psychiatric/Behavioral:  Positive for depression. The patient is nervous/anxious.   ?All other systems reviewed and are negative. ? ?PAST MEDICAL/SURGICAL HISTORY:  ?Past Medical History:  ?Diagnosis Date  ? CAD (coronary artery disease)   ? a. 01/2013: cath showing nonobstructive disease. b. 11/2020: NSTEMI with DES to mid-LCx and medical management recommended of distal LCx disease given small vessel size  ? Hepatocellular carcinoma ([name])   ? Stage IV  ? Hyperlipidemia   ? Hypertension   ? Testosterone deficiency 2009  ? ?Past Surgical History:  ?Procedure Laterality Date  ? BALLOON DILATION  07/30/2020  ? Procedure: BALLOON DILATION;  Surgeon: Eloise Harman, DO;  [facility];  Service: Endoscopy;;  ? COLONOSCOPY N/A 03/12/2017  ? Procedure: COLONOSCOPY;  Surgeon: Danie Binder, MD;  [facility];  Service: Endoscopy;  Laterality: N/A;  2:00  ? COLONOSCOPY WITH PROPOFOL N/A 06/18/2020  ? Procedure: COLONOSCOPY WITH PROPOFOL;  Surgeon: Eloise Harman, DO;  [facility];  Service: Endoscopy;  Laterality: N/A;  pm appt  ? CORONARY STENT INTERVENTION N/A 12/03/2020  ? Procedure: CORONARY STENT INTERVENTION;  Surgeon: Jettie Booze, MD;  Location: [facility];  Service: Cardiovascular;  Laterality: N/A;  ? ESOPHAGOGASTRODUODENOSCOPY (EGD) WITH PROPOFOL N/A 07/30/2020  ? Procedure: ESOPHAGOGASTRODUODENOSCOPY (EGD) WITH PROPOFOL;  Surgeon: Eloise Harman, DO;  [facility];  Service: Endoscopy;  Laterality: N/A;  11:45am  ? LEFT HEART CATH AND CORONARY ANGIOGRAPHY N/A 12/03/2020  ? Procedure: LEFT HEART CATH AND CORONARY ANGIOGRAPHY;  Surgeon: Jettie Booze, MD;  Location: [facility];  Service: Cardiovascular;  Laterality: N/A;  ? LEFT HEART CATHETERIZATION WITH  CORONARY ANGIOGRAM N/A 02/01/2013  ? Procedure: LEFT HEART CATHETERIZATION WITH CORONARY ANGIOGRAM;  Surgeon: Josue Hector, MD;  Location: [facility];  Service: Cardiovascular;  Laterality: N/A;  ? NECK SURGERY    ? POLYPECTOMY  06/18/2020  ? Procedure: POLYPECTOMY;  Surgeon: Eloise Harman, DO;  [facility];  Service: Endoscopy;;  snare and biospy  ? ? ?SOCIAL HISTORY:  ?Social History  ? ?Socioeconomic History  ? Marital status: Widowed  ?  Spouse name: Not on file  ? Number of children: Not on file  ? Years of education: Not on file  ? Highest education level: Not on file  ?Occupational History  ? Not on file  ?Tobacco Use  ? Smoking status: Never  ? Smokeless tobacco: Never  ?Vaping Use  ? Vaping Use: Never used  ?Substance and Sexual Activity  ? Alcohol use: Not Currently  ?  Comment: 4-6 beers a day  ? Drug use: No  ? Sexual activity: Not on file  ?Other Topics Concern  ? Not on file  ?Social History Narrative  ? Not on file  ? ?Social Determinants of Health  ? ?Financial Resource Strain: Not on file  ?Food Insecurity: Not on file  ?Transportation Needs: Not on file  ?Physical Activity: Not on file  ?Stress: Not on file  ?Social Connections: Not on file  ?Intimate Partner Violence: Not on file  ? ? ?FAMILY HISTORY:  ?Family History  ?Problem Relation Age of Onset  ? Heart attack Father   ?     51's  ? ? ?CURRENT MEDICATIONS:  ?Current Outpatient Medications  ?Medication Sig Dispense Refill  ? ALPRAZolam (XANAX) 0.5 MG tablet Take one tablet po QID 120 tablet 4  ? aspirin EC 81 MG tablet Take 1 tablet (81 mg total) by mouth daily. Swallow whole. 30 tablet 11  ? BRILINTA 90 MG TABS tablet TAKE 1 TABLET(90 MG) BY MOUTH TWICE DAILY 14 tablet 0  ? dextromethorphan-guaiFENesin (ROBITUSSIN-DM) 10-100 MG/5ML liquid Take 5-10 mLs by mouth every 6 (six) hours as needed for cough. (Patient not taking: Reported on 04/29/2021)    ? hydrocortisone (ANUSOL-HC) 2.5 % rectal cream Place 1 application rectally 2  (two) times daily. 30 g 1  ? lidocaine (XYLOCAINE) 2 % solution Use as directed 15 mLs in the mouth or throat every 4 (four) hours as needed for mouth pain. Swish and spit along with 15 mL of Carafate every 4 hours as needed for mild pain. (Patient not taking: Reported on 04/29/2021) 420 mL 0  ? losartan (COZAAR) 25 MG tablet Take 1 tablet (25 mg total) by mouth in the morning. 90 tablet 1  ? metoprolol tartrate (LOPRESSOR) 25 MG tablet Take 0.5 tablets (12.5 mg total) by mouth 2 (two) times daily. 30 tablet 5  ? nitroGLYCERIN (NITROSTAT) 0.4 MG SL tablet Place 1 tablet (0.4 mg total) under the tongue every 5 (five) minutes as needed for chest pain. 25 tablet 2  ? nystatin (MYCOSTATIN) 100000 UNIT/ML suspension 5 ml swish and swallow for 7 days 150 mL 0  ? oxyCODONE (OXY IR/ROXICODONE) 5 MG immediate release tablet Take 1 tablet (5 mg total) by  mouth every 4 (four) hours as needed (pain). 30 tablet 0  ? predniSONE (DELTASONE) 20 MG tablet Take 0.5 tablets (10 mg total) by mouth daily with breakfast. 30 tablet 1  ? sodium chloride (OCEAN) 0.65 % nasal spray 1 spray as needed for congestion. (Patient not taking: Reported on 04/29/2021)    ? sucralfate (CARAFATE) 1 GM/10ML suspension Take 10 mLs (1 g total) by mouth 4 (four) times daily -  with meals and at bedtime. Mix 10 mL of Carafate with 15 mL of lidocaine, swish and spit every 4 hours as needed for mild pain. (Patient not taking: Reported on 04/29/2021) 420 mL 0  ? Vitamin D, Ergocalciferol, (DRISDOL) 1.25 MG (50000 UNIT) CAPS capsule TAKE 1 CAPSULE BY MOUTH WEEKLY (Patient taking differently: Take 50,000 Units by mouth every Monday. TAKE 1 CAPSULE BY MOUTH WEEKLY) 4 capsule 2  ? ?No current facility-administered medications for this visit.  ? ? ?ALLERGIES:  ?Allergies  ?Allergen Reactions  ? Cymbalta [Duloxetine Hcl]   ?  Bad dreams  ? Lodine [Etodolac] Nausea And Vomiting  ? Zoloft [Sertraline Hcl]   ?  REDUCED URINARY FLOW,NOCTURIA  ? ? ?PHYSICAL EXAM:   ?Performance status (ECOG): 1 - Symptomatic but completely ambulatory ? ?There were no vitals filed for this visit. ?Wt Readings from Last 3 Encounters:  ?04/29/21 192 lb 12.8 oz (87.5 kg)  ?04/17/21 192 lb 9.6 o

## 2021-05-08 NOTE — Progress Notes (Signed)
Per MD Patient will receive Tecentriq and Mvasi today. ? ?T.O. Dr Rhys Martini, PharmD ?

## 2021-05-08 NOTE — Patient Instructions (Addendum)
[facility] at [facility] ?Discharge Instructions ? ? ?You were seen and examined today by Dr. Delton Coombes. ? ?He reviewed your lab work.  Your liver enzymes have increased. Your tumor markers have also increased, but your scans don't show any progression of disease. Since your liver enzymes have gone up in spite of [place] holding the Tecentriq since January.  ? ?We will add back Tecentriq to your treatment plan as the elevation in your liver enzymes does not seem to correlate with you receiving this medication. If you are agreeable to this, we will check your liver numbers once a week to make sure they do not skyrocket.  ? ?We will proceed with Avastin and Tecentriq today.  ? ?Return as scheduled.  ? ? ?Thank you for choosing [place] at [facility] to provide your oncology and hematology care.  To afford each patient quality time with our provider, please arrive at least 15 minutes before your scheduled appointment time.  ? ?If you have a lab appointment with the [facility] please come in thru the Main Entrance and check in at the main information desk. ? ?You need to re-schedule your appointment should you arrive 10 or more minutes late.  We strive to give you quality time with our providers, and arriving late affects you and other patients whose appointments are after yours.  Also, if you no show three or more times for appointments you may be dismissed from the clinic at the providers discretion.     ?Again, thank you for choosing [facility].  Our hope is that these requests will decrease the amount of time that you wait before being seen by our physicians.       ?_____________________________________________________________ ? ?Should you have questions after your visit to [facility], please contact our office at [phone] and follow the prompts.  Our office hours are 8:00 a.m. and 4:30 p.m. Monday - Friday.  Please note that  voicemails left after 4:00 p.m. may not be returned until the following business day.  We are closed weekends and major holidays.  You do have access to a nurse 24-7, just call the main number to the clinic [phone] and do not press any options, hold on the line and a nurse will answer the phone.   ? ?For prescription refill requests, have your pharmacy contact our office and allow 72 hours.   ? ?Due to Covid, you will need to wear a mask upon entering the hospital. If you do not have a mask, a mask will be given to you at the Main Entrance upon arrival. For doctor visits, patients may have 1 support person age 13 or older with them. For treatment visits, patients can not have anyone with them due to social distancing guidelines and our immunocompromised population.  ? ?   ?

## 2021-05-08 NOTE — Progress Notes (Signed)
Patient has been examined by Dr. Katragadda, and vital signs and labs have been reviewed. ANC, Creatinine, LFTs, hemoglobin, and platelets are within treatment parameters per M.D. - pt may proceed with treatment.    °

## 2021-05-08 NOTE — Patient Instructions (Signed)
[facility]  Discharge Instructions: ?Thank you for choosing [facility] to provide your oncology and hematology care.  ?If you have a lab appointment with the [facility], please come in thru the Main Entrance and check in at the main information desk. ? ?Wear comfortable clothing and clothing appropriate for easy access to any Portacath or PICC line.  ? ?We strive to give you quality time with your provider. You may need to reschedule your appointment if you arrive late (15 or more minutes).  Arriving late affects you and other patients whose appointments are after yours.  Also, if you miss three or more appointments without notifying the office, you may be dismissed from the clinic at the provider?s discretion.    ?  ?For prescription refill requests, have your pharmacy contact our office and allow 72 hours for refills to be completed.   ? ?Today you received the following chemotherapy and/or immunotherapy agents Tencentriq and Mvasi, return as scheduled. ?  ?To help prevent nausea and vomiting after your treatment, we encourage you to take your nausea medication as directed. ? ?BELOW ARE SYMPTOMS THAT SHOULD BE REPORTED IMMEDIATELY: ?*FEVER GREATER THAN 100.4 F (38 ?C) OR HIGHER ?*CHILLS OR SWEATING ?*NAUSEA AND VOMITING THAT IS NOT CONTROLLED WITH YOUR NAUSEA MEDICATION ?*UNUSUAL SHORTNESS OF BREATH ?*UNUSUAL BRUISING OR BLEEDING ?*URINARY PROBLEMS (pain or burning when urinating, or frequent urination) ?*BOWEL PROBLEMS (unusual diarrhea, constipation, pain near the anus) ?TENDERNESS IN MOUTH AND THROAT WITH OR WITHOUT PRESENCE OF ULCERS (sore throat, sores in mouth, or a toothache) ?UNUSUAL RASH, SWELLING OR PAIN  ?UNUSUAL VAGINAL DISCHARGE OR ITCHING  ? ?Items with * indicate a potential emergency and should be followed up as soon as possible or go to the Emergency Department if any problems should occur. ? ?Please show the CHEMOTHERAPY ALERT CARD or IMMUNOTHERAPY ALERT CARD at  check-in to the Emergency Department and triage nurse. ? ?Should you have questions after your visit or need to cancel or reschedule your appointment, please contact [facility] [phone]  and follow the prompts.  Office hours are 8:00 a.m. to 4:30 p.m. Monday - Friday. Please note that voicemails left after 4:00 p.m. may not be returned until the following business day.  We are closed weekends and major holidays. You have access to a nurse at all times for urgent questions. Please call the main number to the clinic [phone] and follow the prompts. ? ?For any non-urgent questions, you may also contact your provider using MyChart. We now offer e-Visits for anyone 28 and older to request care online for non-urgent symptoms. For details visit [email or web address]. ?  ?Also download the MyChart app! Go to the app store, search "MyChart", open the app, select [facility], and log in with your MyChart username and password. ? ?Due to Covid, a mask is required upon entering the hospital/clinic. If you do not have a mask, one will be given to you upon arrival. For doctor visits, patients may have 1 support person aged 10 or older with them. For treatment visits, patients cannot have anyone with them due to current Covid guidelines and our immunocompromised population.  ?

## 2021-05-08 NOTE — Progress Notes (Signed)
Patient presents today for treatment, patient will receive Tecentriq and Mvasi today per Dr. Delton Coombes. Patient tolerated chemotherapy with no complaints voiced. Side effects with management reviewed understanding verbalized. IV site clean and dry with no bruising or swelling noted at site. Good blood return noted before and after administration of chemotherapy. Band aid applied. Patient left in satisfactory condition with VSS and no s/s of distress noted.  ?

## 2021-05-08 NOTE — Patient Instructions (Signed)
Continue on Anusol cream for your hemorrhoids.  Refill sent to pharmacy today. ? ?Continue on MiraLAX to keep your stools soft. ? ?Recommend over-the-counter fiber and sitz bath's as well. ? ?If you decide you would like to see a surgeon to further discuss your hemorrhoids then just let us know and I will send in referral. ? ?Otherwise follow-up in 6 months. ? ?It was very nice seeing you again today. ? ?Dr. Abbey Chatters ?

## 2021-05-10 ENCOUNTER — Encounter: Payer: Self-pay | Admitting: Hematology

## 2021-05-12 NOTE — Progress Notes (Signed)
? ? ?Primary Care Physician:  Kathyrn Drown, MD ?Primary Gastroenterologist:  Dr. Abbey Chatters ? ?Chief Complaint  ?Patient presents with  ? Follow-up  ?  Follow up on bloodwork  ? ? ?HPI:   ?Logan Alvarez is a 66 y.o. male who presents to the clinic today for follow-up visit.  Patient had history of hepatocellular carcinoma status post resection.  Now with recurrence of his disease on need therapy.  Followed by Dr. Raliegh Ip of oncology. ? ?Underwent EGD June 2022 for variceal screening.  Found to have severe Schatzki's ring dilated with 12 mm balloon.  Recommended repeat EGD though patient did not have this done.  States his dysphagia is okay as long as he cuts his food up into small bites.  Not interested in repeat EGD at this time. ? ?Previously seen for rectal bleeding and discomfort.  Found to have external hemorrhoids.  Started him on Anusol cream and recommended conservative measures including MiraLAX as well as fiber and sitz bath's. ? ?Today states he has been taking his MiraLAX, had to titrate somewhat as 1 full capful daily made him too loose.  Currently taking one half capful daily with soft stools. ? ?Bleeding also improved with Anusol cream.  States from a hemorrhoid standpoint he is overall doing much better today. ? ?Past Medical History:  ?Diagnosis Date  ? CAD (coronary artery disease)   ? a. 01/2013: cath showing nonobstructive disease. b. 11/2020: NSTEMI with DES to mid-LCx and medical management recommended of distal LCx disease given small vessel size  ? Hepatocellular carcinoma ([place])   ? Stage IV  ? Hyperlipidemia   ? Hypertension   ? Testosterone deficiency 2009  ? ? ?Past Surgical History:  ?Procedure Laterality Date  ? BALLOON DILATION  07/30/2020  ? Procedure: BALLOON DILATION;  Surgeon: Eloise Harman, DO;  [facility];  Service: Endoscopy;;  ? COLONOSCOPY N/A 03/12/2017  ? Procedure: COLONOSCOPY;  Surgeon: Danie Binder, MD;  [facility];  Service: Endoscopy;   Laterality: N/A;  2:00  ? COLONOSCOPY WITH PROPOFOL N/A 06/18/2020  ? Procedure: COLONOSCOPY WITH PROPOFOL;  Surgeon: Eloise Harman, DO;  [facility];  Service: Endoscopy;  Laterality: N/A;  pm appt  ? CORONARY STENT INTERVENTION N/A 12/03/2020  ? Procedure: CORONARY STENT INTERVENTION;  Surgeon: Jettie Booze, MD;  Location: [place] [facility];  Service: Cardiovascular;  Laterality: N/A;  ? ESOPHAGOGASTRODUODENOSCOPY (EGD) WITH PROPOFOL N/A 07/30/2020  ? Procedure: ESOPHAGOGASTRODUODENOSCOPY (EGD) WITH PROPOFOL;  Surgeon: Eloise Harman, DO;  [facility];  Service: Endoscopy;  Laterality: N/A;  11:45am  ? LEFT HEART CATH AND CORONARY ANGIOGRAPHY N/A 12/03/2020  ? Procedure: LEFT HEART CATH AND CORONARY ANGIOGRAPHY;  Surgeon: Jettie Booze, MD;  Location: [place] [facility];  Service: Cardiovascular;  Laterality: N/A;  ? LEFT HEART CATHETERIZATION WITH CORONARY ANGIOGRAM N/A 02/01/2013  ? Procedure: LEFT HEART CATHETERIZATION WITH CORONARY ANGIOGRAM;  Surgeon: Josue Hector, MD;  Location: [place] [facility];  Service: Cardiovascular;  Laterality: N/A;  ? NECK SURGERY    ? POLYPECTOMY  06/18/2020  ? Procedure: POLYPECTOMY;  Surgeon: Eloise Harman, DO;  [facility];  Service: Endoscopy;;  snare and biospy  ? ? ?Current Outpatient Medications  ?Medication Sig Dispense Refill  ? ALPRAZolam (XANAX) 0.5 MG tablet Take one tablet po QID 120 tablet 4  ? aspirin EC 81 MG tablet Take 1 tablet (81 mg total) by mouth daily. Swallow whole.  30 tablet 11  ? BRILINTA 90 MG TABS tablet TAKE 1 TABLET(90 MG) BY MOUTH TWICE DAILY 14 tablet 0  ? lidocaine (XYLOCAINE) 2 % solution Use as directed 15 mLs in the mouth or throat every 4 (four) hours as needed for mouth pain. Swish and spit along with 15 mL of Carafate every 4 hours as needed for mild pain. 420 mL 0  ? losartan (COZAAR) 25 MG tablet Take 1 tablet (25 mg total) by mouth in the morning. 90 tablet 1  ? metoprolol tartrate  (LOPRESSOR) 25 MG tablet Take 0.5 tablets (12.5 mg total) by mouth 2 (two) times daily. 30 tablet 5  ? nitroGLYCERIN (NITROSTAT) 0.4 MG SL tablet Place 1 tablet (0.4 mg total) under the tongue every 5 (five) minutes as needed for chest pain. 25 tablet 2  ? oxyCODONE (OXY IR/ROXICODONE) 5 MG immediate release tablet Take 1 tablet (5 mg total) by mouth every 4 (four) hours as needed (pain). 30 tablet 0  ? predniSONE (DELTASONE) 20 MG tablet Take 0.5 tablets (10 mg total) by mouth daily with breakfast. 30 tablet 1  ? prochlorperazine (COMPAZINE) 10 MG tablet Take 1 tablet (10 mg total) by mouth every 6 (six) hours as needed for nausea or vomiting. 30 tablet 3  ? sucralfate (CARAFATE) 1 GM/10ML suspension Take 10 mLs (1 g total) by mouth 4 (four) times daily -  with meals and at bedtime. Mix 10 mL of Carafate with 15 mL of lidocaine, swish and spit every 4 hours as needed for mild pain. 420 mL 0  ? Vitamin D, Ergocalciferol, (DRISDOL) 1.25 MG (50000 UNIT) CAPS capsule TAKE 1 CAPSULE BY MOUTH WEEKLY (Patient taking differently: Take 50,000 Units by mouth every Monday. TAKE 1 CAPSULE BY MOUTH WEEKLY) 4 capsule 2  ? dextromethorphan-guaiFENesin (ROBITUSSIN-DM) 10-100 MG/5ML liquid Take 5-10 mLs by mouth every 6 (six) hours as needed for cough. (Patient not taking: Reported on 05/08/2021)    ? hydrocortisone (ANUSOL-HC) 2.5 % rectal cream Place 1 application. rectally 2 (two) times daily. 30 g 5  ? nystatin (MYCOSTATIN) 100000 UNIT/ML suspension 5 ml swish and swallow for 7 days (Patient not taking: Reported on 05/08/2021) 150 mL 0  ? sodium chloride (OCEAN) 0.65 % nasal spray 1 spray as needed for congestion. (Patient not taking: Reported on 05/08/2021)    ? ?No current facility-administered medications for this visit.  ? ? ?Allergies as of 05/08/2021 - Review Complete 05/08/2021  ?Allergen Reaction Noted  ? Cymbalta [duloxetine hcl]  08/28/2016  ? Lodine [etodolac] Nausea And Vomiting 05/20/2012  ? Zoloft [sertraline hcl]   01/29/2017  ? ? ?Family History  ?Problem Relation Age of Onset  ? Heart attack Father   ?     92's  ? ? ?Social History  ? ?Socioeconomic History  ? Marital status: Widowed  ?  Spouse name: Not on file  ? Number of children: Not on file  ? Years of education: Not on file  ? Highest education level: Not on file  ?Occupational History  ? Not on file  ?Tobacco Use  ? Smoking status: Never  ? Smokeless tobacco: Never  ?Vaping Use  ? Vaping Use: Never used  ?Substance and Sexual Activity  ? Alcohol use: Not Currently  ?  Comment: 4-6 beers a day  ? Drug use: No  ? Sexual activity: Not on file  ?Other Topics Concern  ? Not on file  ?Social History Narrative  ? Not on file  ? ?Social Determinants of  Health  ? ?Financial Resource Strain: Not on file  ?Food Insecurity: Not on file  ?Transportation Needs: Not on file  ?Physical Activity: Not on file  ?Stress: Not on file  ?Social Connections: Not on file  ?Intimate Partner Violence: Not on file  ? ? ?Subjective: ?Review of Systems  ?Constitutional:  Negative for chills and fever.  ?HENT:  Negative for congestion and hearing loss.   ?Eyes:  Negative for blurred vision and double vision.  ?Respiratory:  Negative for cough and shortness of breath.   ?Cardiovascular:  Negative for chest pain and palpitations.  ?Gastrointestinal:  Negative for abdominal pain, blood in stool, constipation, diarrhea, heartburn, melena and vomiting.  ?Genitourinary:  Negative for dysuria and urgency.  ?Musculoskeletal:  Negative for joint pain and myalgias.  ?Skin:  Negative for itching and rash.  ?Neurological:  Negative for dizziness and headaches.  ?Psychiatric/Behavioral:  Negative for depression. The patient is not nervous/anxious.    ? ? ? ?Objective: ?BP 122/60   Pulse 73   Temp (!) 97.1 ?F (36.2 ?C)   Ht '5\' 8"'$  (1.727 m)   Wt 197 lb 6.4 oz (89.5 kg)   BMI 30.01 kg/m?  ?Physical Exam ?Constitutional:   ?   Appearance: Normal appearance.  ?HENT:  ?   Head: Normocephalic and atraumatic.   ?Eyes:  ?   Extraocular Movements: Extraocular movements intact.  ?   Conjunctiva/sclera: Conjunctivae normal.  ?Cardiovascular:  ?   Rate and Rhythm: Normal rate and regular rhythm.  ?Pulmonary:  ?   Effort: Pulmonary effort

## 2021-05-13 ENCOUNTER — Encounter: Payer: Self-pay | Admitting: Cardiovascular Disease

## 2021-05-13 ENCOUNTER — Other Ambulatory Visit: Payer: Self-pay | Admitting: Cardiovascular Disease

## 2021-05-15 ENCOUNTER — Inpatient Hospital Stay (HOSPITAL_COMMUNITY): Payer: Medicare Other

## 2021-05-15 DIAGNOSIS — C220 Liver cell carcinoma: Secondary | ICD-10-CM

## 2021-05-15 DIAGNOSIS — Z5111 Encounter for antineoplastic chemotherapy: Secondary | ICD-10-CM | POA: Diagnosis not present

## 2021-05-15 LAB — HEPATIC FUNCTION PANEL
ALT: 283 U/L — ABNORMAL HIGH (ref 0–44)
AST: 209 U/L — ABNORMAL HIGH (ref 15–41)
Albumin: 3.5 g/dL (ref 3.5–5.0)
Alkaline Phosphatase: 99 U/L (ref 38–126)
Bilirubin, Direct: 0.4 mg/dL — ABNORMAL HIGH (ref 0.0–0.2)
Indirect Bilirubin: 1.5 mg/dL — ABNORMAL HIGH (ref 0.3–0.9)
Total Bilirubin: 1.9 mg/dL — ABNORMAL HIGH (ref 0.3–1.2)
Total Protein: 7.1 g/dL (ref 6.5–8.1)

## 2021-05-21 ENCOUNTER — Telehealth: Payer: Self-pay | Admitting: *Deleted

## 2021-05-21 NOTE — Telephone Encounter (Addendum)
Spoke with pt and notified that he has been denied for the AZ&ME ( Brilinta). Notified pt that we could send in an appeal request. I have asked him to provide out of pocket expenses for this year and proof of SSA income for this year.  ?

## 2021-05-22 ENCOUNTER — Encounter (HOSPITAL_BASED_OUTPATIENT_CLINIC_OR_DEPARTMENT_OTHER): Payer: Self-pay

## 2021-05-22 ENCOUNTER — Inpatient Hospital Stay (HOSPITAL_COMMUNITY): Payer: Medicare Other

## 2021-05-22 ENCOUNTER — Ambulatory Visit (HOSPITAL_BASED_OUTPATIENT_CLINIC_OR_DEPARTMENT_OTHER)
Admission: RE | Admit: 2021-05-22 | Discharge: 2021-05-22 | Disposition: A | Discharge status: Home / Self Care | Payer: Medicare Other | Source: Ambulatory Visit | Attending: Hematology | Admitting: Hematology

## 2021-05-22 DIAGNOSIS — Z5111 Encounter for antineoplastic chemotherapy: Secondary | ICD-10-CM | POA: Diagnosis not present

## 2021-05-22 DIAGNOSIS — C220 Liver cell carcinoma: Secondary | ICD-10-CM | POA: Insufficient documentation

## 2021-05-22 LAB — COMPREHENSIVE METABOLIC PANEL
ALT: 259 U/L — ABNORMAL HIGH (ref 0–44)
AST: 205 U/L — ABNORMAL HIGH (ref 15–41)
Albumin: 3.9 g/dL (ref 3.5–5.0)
Alkaline Phosphatase: 101 U/L (ref 38–126)
Anion gap: 9 (ref 5–15)
BUN: 9 mg/dL (ref 8–23)
CO2: 27 mmol/L (ref 22–32)
Calcium: 9.3 mg/dL (ref 8.9–10.3)
Chloride: 100 mmol/L (ref 98–111)
Creatinine, Ser: 0.72 mg/dL (ref 0.61–1.24)
GFR, Estimated: >60 mL/min (ref >60)
Glucose, Bld: 182 mg/dL — ABNORMAL HIGH (ref 70–99)
Potassium: 4.3 mmol/L (ref 3.5–5.1)
Sodium: 136 mmol/L (ref 135–145)
Total Bilirubin: 2.6 mg/dL — ABNORMAL HIGH (ref 0.3–1.2)
Total Protein: 7.8 g/dL (ref 6.5–8.1)

## 2021-05-22 LAB — CBC WITH DIFFERENTIAL/PLATELET
Abs Immature Granulocytes: 0.02 10*3/uL (ref 0.00–0.07)
Basophils Absolute: 0 10*3/uL (ref 0.0–0.1)
Basophils Relative: 1 %
Eosinophils Absolute: 0.1 10*3/uL (ref 0.0–0.5)
Eosinophils Relative: 1 %
HCT: 47.8 % (ref 39.0–52.0)
Hemoglobin: 16.2 g/dL (ref 13.0–17.0)
Immature Granulocytes: 0 %
Lymphocytes Relative: 8 %
Lymphs Abs: 0.7 10*3/uL (ref 0.7–4.0)
MCH: 31.2 pg (ref 26.0–34.0)
MCHC: 33.9 g/dL (ref 30.0–36.0)
MCV: 92.1 fL (ref 80.0–100.0)
Monocytes Absolute: 0.8 10*3/uL (ref 0.1–1.0)
Monocytes Relative: 9 %
Neutro Abs: 7.2 10*3/uL (ref 1.7–7.7)
Neutrophils Relative %: 81 %
Platelets: 235 10*3/uL (ref 150–400)
RBC: 5.19 MIL/uL (ref 4.22–5.81)
RDW: 16.6 % — ABNORMAL HIGH (ref 11.5–15.5)
WBC: 8.8 10*3/uL (ref 4.0–10.5)
nRBC: 0 % (ref 0.0–0.2)

## 2021-05-22 LAB — LACTATE DEHYDROGENASE: LDH: 167 U/L (ref 98–192)

## 2021-05-22 LAB — MAGNESIUM: Magnesium: 2.1 mg/dL (ref 1.7–2.4)

## 2021-05-22 MED ORDER — IOHEXOL 300 MG/ML  SOLN
100.0000 mL | Freq: Once | INTRAMUSCULAR | Status: AC | PRN
  Administered 2021-05-22: 85 mL via INTRAVENOUS

## 2021-05-23 LAB — CEA: CEA: 72.9 ng/mL — ABNORMAL HIGH (ref 0.0–4.7)

## 2021-05-28 LAB — CANCER ANTIGEN 19-9: CA 19-9: 26225 U/mL — ABNORMAL HIGH (ref 0–35)

## 2021-05-29 ENCOUNTER — Inpatient Hospital Stay (HOSPITAL_COMMUNITY): Payer: Medicare Other

## 2021-05-29 DIAGNOSIS — Z5111 Encounter for antineoplastic chemotherapy: Secondary | ICD-10-CM | POA: Diagnosis not present

## 2021-05-29 DIAGNOSIS — C220 Liver cell carcinoma: Secondary | ICD-10-CM

## 2021-05-29 LAB — HEPATIC FUNCTION PANEL
ALT: 195 U/L — ABNORMAL HIGH (ref 0–44)
AST: 172 U/L — ABNORMAL HIGH (ref 15–41)
Albumin: 3.8 g/dL (ref 3.5–5.0)
Alkaline Phosphatase: 100 U/L (ref 38–126)
Bilirubin, Direct: 0.4 mg/dL — ABNORMAL HIGH (ref 0.0–0.2)
Indirect Bilirubin: 1.7 mg/dL — ABNORMAL HIGH (ref 0.3–0.9)
Total Bilirubin: 2.1 mg/dL — ABNORMAL HIGH (ref 0.3–1.2)
Total Protein: 7.6 g/dL (ref 6.5–8.1)

## 2021-06-02 ENCOUNTER — Telehealth: Payer: Self-pay | Admitting: Cardiovascular Disease

## 2021-06-02 ENCOUNTER — Telehealth: Payer: Self-pay | Admitting: *Deleted

## 2021-06-02 DIAGNOSIS — K56609 Unspecified intestinal obstruction, unspecified as to partial versus complete obstruction: Secondary | ICD-10-CM

## 2021-06-02 HISTORY — DX: Unspecified intestinal obstruction, unspecified as to partial versus complete obstruction: K56.609

## 2021-06-02 MED ORDER — CLOPIDOGREL BISULFATE 75 MG PO TABS: 75.0000 mg | ORAL_TABLET | Freq: Every day | ORAL | 3 refills | Status: AC

## 2021-06-02 NOTE — Telephone Encounter (Signed)
Patient is calling about his Brilinta, he runs out of medication tomorrow night.  It will be his last dose.   ?

## 2021-06-02 NOTE — Telephone Encounter (Signed)
Pt notified of medication change. All questions answered. Order placed.  ?

## 2021-06-02 NOTE — Telephone Encounter (Signed)
? ? ?  Can stop Brilinta and switch to Plavix '75mg'$  daily. Given he is 6 months out from stent placement and due to his medical issues, would switch without doing a loading dose.  ? ?Signed, ?Erma Heritage, PA-C ?06/02/2021, 12:57 PM ? ? ?

## 2021-06-02 NOTE — Telephone Encounter (Signed)
Pt in office and states that he is out of Brilinta and a month's Rx will cost him $400. Pt was denied pt assistance d/t having part D and he was above the income limit. We have sent an appeal. I will call to check the status. Please advise.  ?

## 2021-06-03 ENCOUNTER — Ambulatory Visit (INDEPENDENT_AMBULATORY_CARE_PROVIDER_SITE_OTHER): Payer: Medicare Other | Admitting: Student

## 2021-06-03 ENCOUNTER — Inpatient Hospital Stay (HOSPITAL_COMMUNITY): Payer: Medicare Other

## 2021-06-03 ENCOUNTER — Inpatient Hospital Stay (HOSPITAL_COMMUNITY): Payer: Medicare Other | Attending: Hematology

## 2021-06-03 ENCOUNTER — Encounter: Payer: Self-pay | Admitting: Student

## 2021-06-03 ENCOUNTER — Inpatient Hospital Stay (HOSPITAL_BASED_OUTPATIENT_CLINIC_OR_DEPARTMENT_OTHER): Payer: Medicare Other | Admitting: Hematology

## 2021-06-03 VITALS — BP 124/68 | HR 81 | Ht 68.0 in | Wt 189.2 lb

## 2021-06-03 VITALS — BP 132/83 | HR 82 | Temp 97.1°F | Resp 18

## 2021-06-03 VITALS — BP 133/74 | HR 86 | Temp 96.3°F | Resp 18 | Ht 67.72 in | Wt 187.2 lb

## 2021-06-03 DIAGNOSIS — I10 Essential (primary) hypertension: Secondary | ICD-10-CM | POA: Diagnosis not present

## 2021-06-03 DIAGNOSIS — R978 Other abnormal tumor markers: Secondary | ICD-10-CM | POA: Diagnosis not present

## 2021-06-03 DIAGNOSIS — R970 Elevated carcinoembryonic antigen [CEA]: Secondary | ICD-10-CM | POA: Diagnosis not present

## 2021-06-03 DIAGNOSIS — C220 Liver cell carcinoma: Secondary | ICD-10-CM

## 2021-06-03 DIAGNOSIS — Z5111 Encounter for antineoplastic chemotherapy: Secondary | ICD-10-CM | POA: Diagnosis present

## 2021-06-03 DIAGNOSIS — M2550 Pain in unspecified joint: Secondary | ICD-10-CM | POA: Insufficient documentation

## 2021-06-03 DIAGNOSIS — E785 Hyperlipidemia, unspecified: Secondary | ICD-10-CM | POA: Diagnosis not present

## 2021-06-03 DIAGNOSIS — Z79899 Other long term (current) drug therapy: Secondary | ICD-10-CM | POA: Diagnosis not present

## 2021-06-03 DIAGNOSIS — I2510 Atherosclerotic heart disease of native coronary artery without angina pectoris: Secondary | ICD-10-CM

## 2021-06-03 DIAGNOSIS — C786 Secondary malignant neoplasm of retroperitoneum and peritoneum: Secondary | ICD-10-CM | POA: Diagnosis not present

## 2021-06-03 DIAGNOSIS — Z808 Family history of malignant neoplasm of other organs or systems: Secondary | ICD-10-CM | POA: Insufficient documentation

## 2021-06-03 DIAGNOSIS — I119 Hypertensive heart disease without heart failure: Secondary | ICD-10-CM | POA: Insufficient documentation

## 2021-06-03 DIAGNOSIS — Z5112 Encounter for antineoplastic immunotherapy: Secondary | ICD-10-CM | POA: Diagnosis not present

## 2021-06-03 DIAGNOSIS — R7401 Elevation of levels of liver transaminase levels: Secondary | ICD-10-CM | POA: Insufficient documentation

## 2021-06-03 DIAGNOSIS — K5900 Constipation, unspecified: Secondary | ICD-10-CM | POA: Insufficient documentation

## 2021-06-03 LAB — COMPREHENSIVE METABOLIC PANEL
ALT: 140 U/L — ABNORMAL HIGH (ref 0–44)
AST: 120 U/L — ABNORMAL HIGH (ref 15–41)
Albumin: 3.8 g/dL (ref 3.5–5.0)
Alkaline Phosphatase: 100 U/L (ref 38–126)
Anion gap: 11 (ref 5–15)
BUN: 7 mg/dL — ABNORMAL LOW (ref 8–23)
CO2: 21 mmol/L — ABNORMAL LOW (ref 22–32)
Calcium: 9.3 mg/dL (ref 8.9–10.3)
Chloride: 101 mmol/L (ref 98–111)
Creatinine, Ser: 0.65 mg/dL (ref 0.61–1.24)
GFR, Estimated: >60 mL/min (ref >60)
Glucose, Bld: 159 mg/dL — ABNORMAL HIGH (ref 70–99)
Potassium: 4.5 mmol/L (ref 3.5–5.1)
Sodium: 133 mmol/L — ABNORMAL LOW (ref 135–145)
Total Bilirubin: 2.1 mg/dL — ABNORMAL HIGH (ref 0.3–1.2)
Total Protein: 7.8 g/dL (ref 6.5–8.1)

## 2021-06-03 LAB — CBC WITH DIFFERENTIAL/PLATELET
Abs Immature Granulocytes: 0.06 10*3/uL (ref 0.00–0.07)
Basophils Absolute: 0 10*3/uL (ref 0.0–0.1)
Basophils Relative: 0 %
Eosinophils Absolute: 0.1 10*3/uL (ref 0.0–0.5)
Eosinophils Relative: 0 %
HCT: 48.8 % (ref 39.0–52.0)
Hemoglobin: 16.7 g/dL (ref 13.0–17.0)
Immature Granulocytes: 1 %
Lymphocytes Relative: 8 %
Lymphs Abs: 0.9 10*3/uL (ref 0.7–4.0)
MCH: 31.8 pg (ref 26.0–34.0)
MCHC: 34.2 g/dL (ref 30.0–36.0)
MCV: 93 fL (ref 80.0–100.0)
Monocytes Absolute: 1 10*3/uL (ref 0.1–1.0)
Monocytes Relative: 9 %
Neutro Abs: 9.2 10*3/uL — ABNORMAL HIGH (ref 1.7–7.7)
Neutrophils Relative %: 82 %
Platelets: 276 10*3/uL (ref 150–400)
RBC: 5.25 MIL/uL (ref 4.22–5.81)
RDW: 16.5 % — ABNORMAL HIGH (ref 11.5–15.5)
WBC: 11.3 10*3/uL — ABNORMAL HIGH (ref 4.0–10.5)
nRBC: 0 % (ref 0.0–0.2)

## 2021-06-03 LAB — HEPATIC FUNCTION PANEL
ALT: 141 U/L — ABNORMAL HIGH (ref 0–44)
AST: 121 U/L — ABNORMAL HIGH (ref 15–41)
Albumin: 3.8 g/dL (ref 3.5–5.0)
Alkaline Phosphatase: 100 U/L (ref 38–126)
Bilirubin, Direct: 0.4 mg/dL — ABNORMAL HIGH (ref 0.0–0.2)
Indirect Bilirubin: 1.9 mg/dL — ABNORMAL HIGH (ref 0.3–0.9)
Total Bilirubin: 2.3 mg/dL — ABNORMAL HIGH (ref 0.3–1.2)
Total Protein: 7.8 g/dL (ref 6.5–8.1)

## 2021-06-03 MED ORDER — SODIUM CHLORIDE 0.9 % IV SOLN
15.0000 mg/kg | Freq: Once | INTRAVENOUS | Status: AC
  Administered 2021-06-03: 1300 mg via INTRAVENOUS
  Filled 2021-06-03: qty 48

## 2021-06-03 MED ORDER — SODIUM CHLORIDE 0.9 % IV SOLN: Freq: Once | INTRAVENOUS | Status: AC

## 2021-06-03 MED ORDER — SODIUM CHLORIDE 0.9 % IV SOLN
1200.0000 mg | Freq: Once | INTRAVENOUS | Status: AC
  Administered 2021-06-03: 1200 mg via INTRAVENOUS
  Filled 2021-06-03: qty 20

## 2021-06-03 NOTE — Telephone Encounter (Signed)
Pt switched from Brilinta to Plavix per provider ?

## 2021-06-03 NOTE — Progress Notes (Signed)
Patient has been examined by Dr. Katragadda, and vital signs and labs have been reviewed. ANC, Creatinine, LFTs, hemoglobin, and platelets are within treatment parameters per M.D. - pt may proceed with treatment.    °

## 2021-06-03 NOTE — Progress Notes (Signed)
Patient presents today for Tecentriq/MVASI infusion per providers order.  Vital signs within parameters for treatment.  Labs pending.  Patient has no new complaints at this time. ? ?Peripheral IV started and blood return noted pre and post infusion.   ? ?Tecentriq/MVASI given today per MD orders.  Stable during infusion without adverse affects.  Vital signs stable.  No complaints at this time.  Discharge from clinic ambulatory in stable condition.  Alert and oriented X 3.  Follow up with [facility] as scheduled.  ?

## 2021-06-03 NOTE — Patient Instructions (Signed)
Medication Instructions:  Your physician recommends that you continue on your current medications as directed. Please refer to the Current Medication list given to you today.  *If you need a refill on your cardiac medications before your next appointment, please call your pharmacy*   Lab Work: NONE   If you have labs (blood work) drawn today and your tests are completely normal, you will receive your results only by: MyChart Message (if you have MyChart) OR A paper copy in the mail If you have any lab test that is abnormal or we need to change your treatment, we will call you to review the results.   Testing/Procedures: NONE    Follow-Up: At [facility], you and your health needs are our priority.  As part of our continuing mission to provide you with exceptional heart care, we have created designated Provider Care Teams.  These Care Teams include your primary Cardiologist (physician) and Advanced Practice Providers (APPs -  Physician Assistants and Nurse Practitioners) who all work together to provide you with the care you need, when you need it.  We recommend signing up for the patient portal called "MyChart".  Sign up information is provided on this After Visit Summary.  MyChart is used to connect with patients for Virtual Visits (Telemedicine).  Patients are able to view lab/test results, encounter notes, upcoming appointments, etc.  Non-urgent messages can be sent to your provider as well.   To learn more about what you can do with MyChart, go to [email or web address].    Your next appointment:   6 month(s)  The format for your next appointment:   In Person  Provider:   Peter Nishan, MD    Other Instructions Thank you for choosing [facility]!    Important Information About Sugar       

## 2021-06-03 NOTE — Patient Instructions (Signed)
[place] at [facility] ?Discharge Instructions ? ? ?You were seen and examined today by Dr. Delton Coombes. ? ?He reviewed your lab results.  Your liver enzymes have improved. He also reviewed the results of your CT scan which is good/stable.  ? ?We will proceed with treatment today.  ? ?Return as scheduled.  ? ? ?Thank you for choosing [place] at [facility] to provide your oncology and hematology care.  To afford each patient quality time with our provider, please arrive at least 15 minutes before your scheduled appointment time.  ? ?If you have a lab appointment with the [facility] please come in thru the Main Entrance and check in at the main information desk. ? ?You need to re-schedule your appointment should you arrive 10 or more minutes late.  We strive to give you quality time with our providers, and arriving late affects you and other patients whose appointments are after yours.  Also, if you no show three or more times for appointments you may be dismissed from the clinic at the providers discretion.     ?Again, thank you for choosing [facility].  Our hope is that these requests will decrease the amount of time that you wait before being seen by our physicians.       ?_____________________________________________________________ ? ?Should you have questions after your visit to [facility], please contact our office at [phone] and follow the prompts.  Our office hours are 8:00 a.m. and 4:30 p.m. Monday - Friday.  Please note that voicemails left after 4:00 p.m. may not be returned until the following business day.  We are closed weekends and major holidays.  You do have access to a nurse 24-7, just call the main number to the clinic [phone] and do not press any options, hold on the line and a nurse will answer the phone.   ? ?For prescription refill requests, have your pharmacy contact our office and allow 72 hours.    ? ?Due to Covid, you will need to wear a mask upon entering the hospital. If you do not have a mask, a mask will be given to you at the Main Entrance upon arrival. For doctor visits, patients may have 1 support person age 9 or older with them. For treatment visits, patients can not have anyone with them due to social distancing guidelines and our immunocompromised population.  ? ?   ?

## 2021-06-03 NOTE — Progress Notes (Signed)
? ?Cardiology Office Note   ? ?Date:  06/03/2021  ? ?ID:  THAXTON Alvarez, DOB 1955/08/17, MRN [id number] ? ?PCP:  Kathyrn Drown, MD  ?Cardiologist: Jenkins Rouge, MD   ? ?Chief Complaint  ?Patient presents with  ? Follow-up  ?  Overdue Visit  ? ? ?History of Present Illness:   ? ?Logan Alvarez is a 66 y.o. male with past medical history of CAD (s/p NSTEMI in 11/2020 with DES to mid-LCx and medical management of distal LCx disease recommended given small vessel size), HTN, HLD and Stage 4 hepatocellular carcinoma who presents to the office today for overdue follow-up. ? ?He was examined by myself in 12/2020 following his recent hospitalization and reported overall feeling weak but denied any recurrent anginal symptoms. Given recent episodes of hypotension and dizziness, Losartan was reduced to 25 mg daily. In the interim, he was evaluated at [facility] ED in 02/2021 for recent episodes of sharp chest pain which occurred at rest. Troponin values were negative and his chest pain was overall felt to be atypical for a cardiac etiology. Dr. Gardiner Rhyme did recommend a Lexiscan Myoview for further ischemic evaluation and this showed a small defect consistent with artifact with no evidence of ischemia and was overall a low-risk study. He was recently switched from Brilinta to Plavix given his co-pay for the medication and due to prior recommendations for this based off his catheterization. ? ?In talking with the patient today, he reports he has been doing well from a cardiac perspective since his ED evaluation in 02/2021. No recurrent chest pain and thinks his episode at that time could have been secondary to anxiety. He denies any dyspnea on exertion but does experience intermittent dyspnea in the morning hours which resolves. No associated orthopnea, PND or pitting edema. He is back receiving treatments for his hepatocellular carcinoma as this was previously on hold given his elevated LFT's but they have improved.  ? ? ?Past  Medical History:  ?Diagnosis Date  ? CAD (coronary artery disease)   ? a. 01/2013: cath showing nonobstructive disease. b. 11/2020: NSTEMI with DES to mid-LCx and medical management recommended of distal LCx disease given small vessel size  ? Hepatocellular carcinoma ([place])   ? Stage IV  ? Hyperlipidemia   ? Hypertension   ? Testosterone deficiency 2009  ? ? ?Past Surgical History:  ?Procedure Laterality Date  ? BALLOON DILATION  07/30/2020  ? Procedure: BALLOON DILATION;  Surgeon: Eloise Harman, DO;  [facility];  Service: Endoscopy;;  ? COLONOSCOPY N/A 03/12/2017  ? Procedure: COLONOSCOPY;  Surgeon: Danie Binder, MD;  [facility];  Service: Endoscopy;  Laterality: N/A;  2:00  ? COLONOSCOPY WITH PROPOFOL N/A 06/18/2020  ? Procedure: COLONOSCOPY WITH PROPOFOL;  Surgeon: Eloise Harman, DO;  [facility];  Service: Endoscopy;  Laterality: N/A;  pm appt  ? CORONARY STENT INTERVENTION N/A 12/03/2020  ? Procedure: CORONARY STENT INTERVENTION;  Surgeon: Jettie Booze, MD;  Location: [place] [facility];  Service: Cardiovascular;  Laterality: N/A;  ? ESOPHAGOGASTRODUODENOSCOPY (EGD) WITH PROPOFOL N/A 07/30/2020  ? Procedure: ESOPHAGOGASTRODUODENOSCOPY (EGD) WITH PROPOFOL;  Surgeon: Eloise Harman, DO;  [facility];  Service: Endoscopy;  Laterality: N/A;  11:45am  ? LEFT HEART CATH AND CORONARY ANGIOGRAPHY N/A 12/03/2020  ? Procedure: LEFT HEART CATH AND CORONARY ANGIOGRAPHY;  Surgeon: Jettie Booze, MD;  Location: [place] [facility];  Service: Cardiovascular;  Laterality: N/A;  ? LEFT  HEART CATHETERIZATION WITH CORONARY ANGIOGRAM N/A 02/01/2013  ? Procedure: LEFT HEART CATHETERIZATION WITH CORONARY ANGIOGRAM;  Surgeon: Josue Hector, MD;  Location: [facility];  Service: Cardiovascular;  Laterality: N/A;  ? NECK SURGERY    ? POLYPECTOMY  06/18/2020  ? Procedure: POLYPECTOMY;  Surgeon: Eloise Harman, DO;  [facility];  Service: Endoscopy;;   snare and biospy  ? ? ?Current Medications: ?Outpatient Medications Prior to Visit  ?Medication Sig Dispense Refill  ? ALPRAZolam (XANAX) 0.5 MG tablet Take one tablet po QID 120 tablet 4  ? aspirin EC 81 MG tablet Take 1 tablet (81 mg total) by mouth daily. Swallow whole. 30 tablet 11  ? clopidogrel (PLAVIX) 75 MG tablet Take 1 tablet (75 mg total) by mouth daily. 90 tablet 3  ? dextromethorphan-guaiFENesin (ROBITUSSIN-DM) 10-100 MG/5ML liquid Take 5-10 mLs by mouth every 6 (six) hours as needed for cough.    ? hydrocortisone (ANUSOL-HC) 2.5 % rectal cream Place 1 application. rectally 2 (two) times daily. 30 g 5  ? lidocaine (XYLOCAINE) 2 % solution Use as directed 15 mLs in the mouth or throat every 4 (four) hours as needed for mouth pain. Swish and spit along with 15 mL of Carafate every 4 hours as needed for mild pain. 420 mL 0  ? losartan (COZAAR) 25 MG tablet Take 1 tablet (25 mg total) by mouth in the morning. 90 tablet 1  ? metoprolol tartrate (LOPRESSOR) 25 MG tablet Take 0.5 tablets (12.5 mg total) by mouth 2 (two) times daily. 30 tablet 5  ? nitroGLYCERIN (NITROSTAT) 0.4 MG SL tablet Place 1 tablet (0.4 mg total) under the tongue every 5 (five) minutes as needed for chest pain. 25 tablet 2  ? nystatin (MYCOSTATIN) 100000 UNIT/ML suspension 5 ml swish and swallow for 7 days 150 mL 0  ? oxyCODONE (OXY IR/ROXICODONE) 5 MG immediate release tablet Take 1 tablet (5 mg total) by mouth every 4 (four) hours as needed (pain). 30 tablet 0  ? predniSONE (DELTASONE) 20 MG tablet Take 0.5 tablets (10 mg total) by mouth daily with breakfast. 30 tablet 1  ? prochlorperazine (COMPAZINE) 10 MG tablet Take 1 tablet (10 mg total) by mouth every 6 (six) hours as needed for nausea or vomiting. 30 tablet 3  ? sodium chloride (OCEAN) 0.65 % nasal spray 1 spray as needed for congestion.    ? sucralfate (CARAFATE) 1 GM/10ML suspension Take 10 mLs (1 g total) by mouth 4 (four) times daily -  with meals and at bedtime. Mix 10 mL  of Carafate with 15 mL of lidocaine, swish and spit every 4 hours as needed for mild pain. 420 mL 0  ? Vitamin D, Ergocalciferol, (DRISDOL) 1.25 MG (50000 UNIT) CAPS capsule TAKE 1 CAPSULE BY MOUTH WEEKLY (Patient taking differently: Take 50,000 Units by mouth every Monday. TAKE 1 CAPSULE BY MOUTH WEEKLY) 4 capsule 2  ? ?No facility-administered medications prior to visit.  ?  ? ?Allergies:   Cymbalta [duloxetine hcl], Lodine [etodolac], and Zoloft [sertraline hcl]  ? ?Social History  ? ?Socioeconomic History  ? Marital status: Widowed  ?  Spouse name: Not on file  ? Number of children: Not on file  ? Years of education: Not on file  ? Highest education level: Not on file  ?Occupational History  ? Not on file  ?Tobacco Use  ? Smoking status: Never  ? Smokeless tobacco: Never  ?Vaping Use  ? Vaping Use: Never used  ?Substance and Sexual  Activity  ? Alcohol use: Not Currently  ?  Comment: 4-6 beers a day  ? Drug use: No  ? Sexual activity: Not on file  ?Other Topics Concern  ? Not on file  ?Social History Narrative  ? Not on file  ? ?Social Determinants of Health  ? ?Financial Resource Strain: Not on file  ?Food Insecurity: Not on file  ?Transportation Needs: Not on file  ?Physical Activity: Not on file  ?Stress: Not on file  ?Social Connections: Not on file  ?  ? ?Family History:  The patient's family history includes Heart attack in his father.  ? ?Review of Systems:   ? ?Please see the history of present illness.    ? ?All other systems reviewed and are otherwise negative except as noted above. ? ? ?Physical Exam:   ? ?VS:  BP 124/68   Pulse 81   Ht '5\' 8"'$  (1.727 m)   Wt 189 lb 3.2 oz (85.8 kg)   SpO2 98%   BMI 28.77 kg/m?    ?General: Well developed, well nourished,male appearing in no acute distress. ?Head: Normocephalic, atraumatic. ?Neck: No carotid bruits. JVD not elevated.  ?Lungs: Respirations regular and unlabored, without wheezes or rales.  ?Heart: Regular rate and rhythm. No S3 or S4.  No murmur, no  rubs, or gallops appreciated. ?Abdomen: Appears non-distended. No obvious abdominal masses. ?Msk:  Strength and tone appear normal for age. No obvious joint deformities or effusions. ?Extremities: No clubbing

## 2021-06-03 NOTE — Patient Instructions (Signed)
[facility]  Discharge Instructions: ?Thank you for choosing [facility] to provide your oncology and hematology care.  ?If you have a lab appointment with the [facility], please come in thru the Main Entrance and check in at the main information desk. ? ?Wear comfortable clothing and clothing appropriate for easy access to any Portacath or PICC line.  ? ?We strive to give you quality time with your provider. You may need to reschedule your appointment if you arrive late (15 or more minutes).  Arriving late affects you and other patients whose appointments are after yours.  Also, if you miss three or more appointments without notifying the office, you may be dismissed from the clinic at the provider?s discretion.    ?  ?For prescription refill requests, have your pharmacy contact our office and allow 72 hours for refills to be completed.   ? ?Today you received the following chemotherapy and/or immunotherapy agents Tecentriq/MVASI    ?  ?To help prevent nausea and vomiting after your treatment, we encourage you to take your nausea medication as directed. ? ?BELOW ARE SYMPTOMS THAT SHOULD BE REPORTED IMMEDIATELY: ?*FEVER GREATER THAN 100.4 F (38 ?C) OR HIGHER ?*CHILLS OR SWEATING ?*NAUSEA AND VOMITING THAT IS NOT CONTROLLED WITH YOUR NAUSEA MEDICATION ?*UNUSUAL SHORTNESS OF BREATH ?*UNUSUAL BRUISING OR BLEEDING ?*URINARY PROBLEMS (pain or burning when urinating, or frequent urination) ?*BOWEL PROBLEMS (unusual diarrhea, constipation, pain near the anus) ?TENDERNESS IN MOUTH AND THROAT WITH OR WITHOUT PRESENCE OF ULCERS (sore throat, sores in mouth, or a toothache) ?UNUSUAL RASH, SWELLING OR PAIN  ?UNUSUAL VAGINAL DISCHARGE OR ITCHING  ? ?Items with * indicate a potential emergency and should be followed up as soon as possible or go to the Emergency Department if any problems should occur. ? ?Please show the CHEMOTHERAPY ALERT CARD or IMMUNOTHERAPY ALERT CARD at check-in to the Emergency  Department and triage nurse. ? ?Should you have questions after your visit or need to cancel or reschedule your appointment, please contact [facility] [phone]  and follow the prompts.  Office hours are 8:00 a.m. to 4:30 p.m. Monday - Friday. Please note that voicemails left after 4:00 p.m. may not be returned until the following business day.  We are closed weekends and major holidays. You have access to a nurse at all times for urgent questions. Please call the main number to the clinic [phone] and follow the prompts. ? ?For any non-urgent questions, you may also contact your provider using MyChart. We now offer e-Visits for anyone 54 and older to request care online for non-urgent symptoms. For details visit [email or web address]. ?  ?Also download the MyChart app! Go to the app store, search "MyChart", open the app, select [facility], and log in with your MyChart username and password. ? ?Due to Covid, a mask is required upon entering the hospital/clinic. If you do not have a mask, one will be given to you upon arrival. For doctor visits, patients may have 1 support person aged 32 or older with them. For treatment visits, patients cannot have anyone with them due to current Covid guidelines and our immunocompromised population.  ?

## 2021-06-03 NOTE — Progress Notes (Signed)
? ?[facility] ?[address] ?[place], [place] [address] ? ? ?CLINIC:  ?Medical Oncology/Hematology ? ?PCP:  ?Kathyrn Drown, MD ?[place] / [place] [address] ?[phone] ? ? ?REASON FOR VISIT:  ?Follow-up for hepatocellular carcinoma with peritoneal carcinomatosis ? ?PRIOR THERAPY: none ? ?NGS Results: not done ? ?CURRENT THERAPY: Atezolizumab + Bevacizumab q21d Maintenance ? ?BRIEF ONCOLOGIC HISTORY:  ?Oncology History  ?Hepatocellular carcinoma ([place])  ?11/12/2017 Initial Diagnosis  ? Hepatocellular carcinoma ([facility]) ? ?  ?07/29/2020 Cancer Staging  ? Staging form: Liver, AJCC 8th Edition ?- Clinical stage from 07/29/2020: Stage IVB (cT1a, cN0, pM1) - Signed by Orson Slick, MD on 07/29/2020 ?Stage prefix: Initial diagnosis ?Histologic grade (G): G3 ?Histologic grading system: 4 grade system ? ?  ?08/15/2020 -  Chemotherapy  ? Patient is on Treatment Plan : [place] Atezolizumab + Bevacizumab q21d Maintenance  ? ?  ?  ? ? ?CANCER STAGING: ? Cancer Staging  ?Hepatocellular carcinoma ([place]) ?Staging form: Liver, AJCC 8th Edition ?- Clinical stage from 07/29/2020: Stage IVB (cT1a, cN0, pM1) - Signed by Orson Slick, MD on 07/29/2020 ? ? ?INTERVAL HISTORY:  ?Logan Alvarez, a 66 y.o. male, returns for routine follow-up and consideration for next cycle of chemotherapy. Logan Alvarez was last seen on 05/08/2021. ? ?Due for cycle #13 of Atezolizumab + Bevacizumab today.  ? ?Overall, he tells me he has been feeling pretty well. He denies current abdominal pain. He reports occasional pain in stomach when laying down after eating. He reports fatigue. He continues to drink 2 cans of beer daily. He reports soreness and aching in this arms and legs for which he takes 1/2 tablet of oxycodone prn.  ? ?Overall, he feels ready for next cycle of chemo today.  ? ?REVIEW OF SYSTEMS:  ?Review of Systems  ?Constitutional:  Positive for fatigue. Negative for appetite change.  ?Gastrointestinal:  Positive for  abdominal pain.  ?Musculoskeletal:  Positive for arthralgias (5/10 arms and legs).  ?Psychiatric/Behavioral:  Positive for depression. The patient is nervous/anxious.   ?All other systems reviewed and are negative. ? ?PAST MEDICAL/SURGICAL HISTORY:  ?Past Medical History:  ?Diagnosis Date  ? CAD (coronary artery disease)   ? a. 01/2013: cath showing nonobstructive disease. b. 11/2020: NSTEMI with DES to mid-LCx and medical management recommended of distal LCx disease given small vessel size  ? Hepatocellular carcinoma ([place])   ? Stage IV  ? Hyperlipidemia   ? Hypertension   ? Testosterone deficiency 2009  ? ?Past Surgical History:  ?Procedure Laterality Date  ? BALLOON DILATION  07/30/2020  ? Procedure: BALLOON DILATION;  Surgeon: Eloise Harman, DO;  [facility];  Service: Endoscopy;;  ? COLONOSCOPY N/A 03/12/2017  ? Procedure: COLONOSCOPY;  Surgeon: Danie Binder, MD;  [facility];  Service: Endoscopy;  Laterality: N/A;  2:00  ? COLONOSCOPY WITH PROPOFOL N/A 06/18/2020  ? Procedure: COLONOSCOPY WITH PROPOFOL;  Surgeon: Eloise Harman, DO;  [facility];  Service: Endoscopy;  Laterality: N/A;  pm appt  ? CORONARY STENT INTERVENTION N/A 12/03/2020  ? Procedure: CORONARY STENT INTERVENTION;  Surgeon: Jettie Booze, MD;  Location: [facility] CV LAB;  Service: Cardiovascular;  Laterality: N/A;  ? ESOPHAGOGASTRODUODENOSCOPY (EGD) WITH PROPOFOL N/A 07/30/2020  ? Procedure: ESOPHAGOGASTRODUODENOSCOPY (EGD) WITH PROPOFOL;  Surgeon: Eloise Harman, DO;  [facility];  Service: Endoscopy;  Laterality: N/A;  11:45am  ? LEFT HEART CATH AND CORONARY ANGIOGRAPHY  N/A 12/03/2020  ? Procedure: LEFT HEART CATH AND CORONARY ANGIOGRAPHY;  Surgeon: Jettie Booze, MD;  Location: [facility] LAB;  Service: Cardiovascular;  Laterality: N/A;  ? LEFT HEART CATHETERIZATION WITH CORONARY ANGIOGRAM N/A 02/01/2013  ? Procedure: LEFT HEART CATHETERIZATION WITH CORONARY ANGIOGRAM;   Surgeon: Josue Hector, MD;  Location: [facility] CATH LAB;  Service: Cardiovascular;  Laterality: N/A;  ? NECK SURGERY    ? POLYPECTOMY  06/18/2020  ? Procedure: POLYPECTOMY;  Surgeon: Eloise Harman, DO;  [facility];  Service: Endoscopy;;  snare and biospy  ? ? ?SOCIAL HISTORY:  ?Social History  ? ?Socioeconomic History  ? Marital status: Widowed  ?  Spouse name: Not on file  ? Number of children: Not on file  ? Years of education: Not on file  ? Highest education level: Not on file  ?Occupational History  ? Not on file  ?Tobacco Use  ? Smoking status: Never  ? Smokeless tobacco: Never  ?Vaping Use  ? Vaping Use: Never used  ?Substance and Sexual Activity  ? Alcohol use: Not Currently  ?  Comment: 4-6 beers a day  ? Drug use: No  ? Sexual activity: Not on file  ?Other Topics Concern  ? Not on file  ?Social History Narrative  ? Not on file  ? ?Social Determinants of Health  ? ?Financial Resource Strain: Not on file  ?Food Insecurity: Not on file  ?Transportation Needs: Not on file  ?Physical Activity: Not on file  ?Stress: Not on file  ?Social Connections: Not on file  ?Intimate Partner Violence: Not on file  ? ? ?FAMILY HISTORY:  ?Family History  ?Problem Relation Age of Onset  ? Heart attack Father   ?     55's  ? ? ?CURRENT MEDICATIONS:  ?Current Outpatient Medications  ?Medication Sig Dispense Refill  ? ALPRAZolam (XANAX) 0.5 MG tablet Take one tablet po QID 120 tablet 4  ? aspirin EC 81 MG tablet Take 1 tablet (81 mg total) by mouth daily. Swallow whole. 30 tablet 11  ? clopidogrel (PLAVIX) 75 MG tablet Take 1 tablet (75 mg total) by mouth daily. 90 tablet 3  ? dextromethorphan-guaiFENesin (ROBITUSSIN-DM) 10-100 MG/5ML liquid Take 5-10 mLs by mouth every 6 (six) hours as needed for cough. (Patient not taking: Reported on 05/08/2021)    ? hydrocortisone (ANUSOL-HC) 2.5 % rectal cream Place 1 application. rectally 2 (two) times daily. 30 g 5  ? lidocaine (XYLOCAINE) 2 % solution Use as directed 15 mLs in  the mouth or throat every 4 (four) hours as needed for mouth pain. Swish and spit along with 15 mL of Carafate every 4 hours as needed for mild pain. 420 mL 0  ? losartan (COZAAR) 25 MG tablet Take 1 tablet (25 mg total) by mouth in the morning. 90 tablet 1  ? metoprolol tartrate (LOPRESSOR) 25 MG tablet Take 0.5 tablets (12.5 mg total) by mouth 2 (two) times daily. 30 tablet 5  ? nitroGLYCERIN (NITROSTAT) 0.4 MG SL tablet Place 1 tablet (0.4 mg total) under the tongue every 5 (five) minutes as needed for chest pain. 25 tablet 2  ? nystatin (MYCOSTATIN) 100000 UNIT/ML suspension 5 ml swish and swallow for 7 days (Patient not taking: Reported on 05/08/2021) 150 mL 0  ? oxyCODONE (OXY IR/ROXICODONE) 5 MG immediate release tablet Take 1 tablet (5 mg total) by mouth every 4 (four) hours as needed (pain). 30 tablet 0  ? predniSONE (DELTASONE) 20 MG tablet Take  0.5 tablets (10 mg total) by mouth daily with breakfast. 30 tablet 1  ? prochlorperazine (COMPAZINE) 10 MG tablet Take 1 tablet (10 mg total) by mouth every 6 (six) hours as needed for nausea or vomiting. 30 tablet 3  ? sodium chloride (OCEAN) 0.65 % nasal spray 1 spray as needed for congestion. (Patient not taking: Reported on 05/08/2021)    ? sucralfate (CARAFATE) 1 GM/10ML suspension Take 10 mLs (1 g total) by mouth 4 (four) times daily -  with meals and at bedtime. Mix 10 mL of Carafate with 15 mL of lidocaine, swish and spit every 4 hours as needed for mild pain. 420 mL 0  ? Vitamin D, Ergocalciferol, (DRISDOL) 1.25 MG (50000 UNIT) CAPS capsule TAKE 1 CAPSULE BY MOUTH WEEKLY (Patient taking differently: Take 50,000 Units by mouth every Monday. TAKE 1 CAPSULE BY MOUTH WEEKLY) 4 capsule 2  ? ?No current facility-administered medications for this visit.  ? ? ?ALLERGIES:  ?Allergies  ?Allergen Reactions  ? Cymbalta [Duloxetine Hcl]   ?  Bad dreams  ? Lodine [Etodolac] Nausea And Vomiting  ? Zoloft [Sertraline Hcl]   ?  REDUCED URINARY FLOW,NOCTURIA  ? ? ?PHYSICAL  EXAM:  ?Performance status (ECOG): 1 - Symptomatic but completely ambulatory ? ?Vitals:  ? 06/03/21 1229  ?BP: 133/74  ?Pulse: 86  ?Resp: 18  ?Temp: (!) 96.3 ?F (35.7 ?C)  ?SpO2: 99%  ? ?Wt Readings from [place]

## 2021-06-11 ENCOUNTER — Other Ambulatory Visit: Payer: Self-pay

## 2021-06-11 ENCOUNTER — Emergency Department (HOSPITAL_COMMUNITY): Payer: Medicare Other

## 2021-06-11 ENCOUNTER — Encounter (HOSPITAL_COMMUNITY): Payer: Self-pay

## 2021-06-11 ENCOUNTER — Inpatient Hospital Stay (HOSPITAL_COMMUNITY)
Admission: EM | Admit: 2021-06-11 | Discharge: 2021-06-15 | DRG: 375 | Disposition: A | Discharge status: Home / Self Care | Payer: Medicare Other | Attending: Family Medicine | Admitting: Family Medicine

## 2021-06-11 ENCOUNTER — Inpatient Hospital Stay (HOSPITAL_COMMUNITY): Payer: Medicare Other

## 2021-06-11 DIAGNOSIS — F419 Anxiety disorder, unspecified: Secondary | ICD-10-CM | POA: Diagnosis present

## 2021-06-11 DIAGNOSIS — Z7982 Long term (current) use of aspirin: Secondary | ICD-10-CM | POA: Diagnosis not present

## 2021-06-11 DIAGNOSIS — Z7952 Long term (current) use of systemic steroids: Secondary | ICD-10-CM | POA: Diagnosis not present

## 2021-06-11 DIAGNOSIS — F1029 Alcohol dependence with unspecified alcohol-induced disorder: Secondary | ICD-10-CM | POA: Diagnosis not present

## 2021-06-11 DIAGNOSIS — R188 Other ascites: Secondary | ICD-10-CM | POA: Diagnosis present

## 2021-06-11 DIAGNOSIS — Z7902 Long term (current) use of antithrombotics/antiplatelets: Secondary | ICD-10-CM | POA: Diagnosis not present

## 2021-06-11 DIAGNOSIS — I10 Essential (primary) hypertension: Secondary | ICD-10-CM | POA: Diagnosis present

## 2021-06-11 DIAGNOSIS — Z79899 Other long term (current) drug therapy: Secondary | ICD-10-CM

## 2021-06-11 DIAGNOSIS — Z515 Encounter for palliative care: Secondary | ICD-10-CM

## 2021-06-11 DIAGNOSIS — Z7189 Other specified counseling: Secondary | ICD-10-CM | POA: Diagnosis not present

## 2021-06-11 DIAGNOSIS — K56609 Unspecified intestinal obstruction, unspecified as to partial versus complete obstruction: Principal | ICD-10-CM

## 2021-06-11 DIAGNOSIS — I25118 Atherosclerotic heart disease of native coronary artery with other forms of angina pectoris: Secondary | ICD-10-CM | POA: Diagnosis present

## 2021-06-11 DIAGNOSIS — F1020 Alcohol dependence, uncomplicated: Secondary | ICD-10-CM | POA: Diagnosis present

## 2021-06-11 DIAGNOSIS — Z8249 Family history of ischemic heart disease and other diseases of the circulatory system: Secondary | ICD-10-CM

## 2021-06-11 DIAGNOSIS — K766 Portal hypertension: Secondary | ICD-10-CM | POA: Diagnosis present

## 2021-06-11 DIAGNOSIS — C220 Liver cell carcinoma: Secondary | ICD-10-CM | POA: Diagnosis present

## 2021-06-11 DIAGNOSIS — Z888 Allergy status to other drugs, medicaments and biological substances status: Secondary | ICD-10-CM | POA: Diagnosis not present

## 2021-06-11 DIAGNOSIS — I2510 Atherosclerotic heart disease of native coronary artery without angina pectoris: Secondary | ICD-10-CM | POA: Diagnosis present

## 2021-06-11 DIAGNOSIS — E782 Mixed hyperlipidemia: Secondary | ICD-10-CM | POA: Diagnosis present

## 2021-06-11 DIAGNOSIS — C786 Secondary malignant neoplasm of retroperitoneum and peritoneum: Principal | ICD-10-CM | POA: Diagnosis present

## 2021-06-11 LAB — URINALYSIS, ROUTINE W REFLEX MICROSCOPIC
Bacteria, UA: NONE SEEN
Bilirubin Urine: NEGATIVE
Glucose, UA: NEGATIVE mg/dL
Hgb urine dipstick: NEGATIVE
Ketones, ur: NEGATIVE mg/dL
Leukocytes,Ua: NEGATIVE
Nitrite: NEGATIVE
Protein, ur: 100 mg/dL — AB
Specific Gravity, Urine: >1.046 — ABNORMAL HIGH (ref 1.005–1.030)
pH: 7 (ref 5.0–8.0)

## 2021-06-11 LAB — COMPREHENSIVE METABOLIC PANEL
ALT: 43 U/L (ref 0–44)
AST: 34 U/L (ref 15–41)
Albumin: 4.1 g/dL (ref 3.5–5.0)
Alkaline Phosphatase: 87 U/L (ref 38–126)
Anion gap: 15 (ref 5–15)
BUN: 7 mg/dL — ABNORMAL LOW (ref 8–23)
CO2: 26 mmol/L (ref 22–32)
Calcium: 10 mg/dL (ref 8.9–10.3)
Chloride: 92 mmol/L — ABNORMAL LOW (ref 98–111)
Creatinine, Ser: 0.63 mg/dL (ref 0.61–1.24)
GFR, Estimated: >60 mL/min (ref >60)
Glucose, Bld: 172 mg/dL — ABNORMAL HIGH (ref 70–99)
Potassium: 4.9 mmol/L (ref 3.5–5.1)
Sodium: 133 mmol/L — ABNORMAL LOW (ref 135–145)
Total Bilirubin: 2 mg/dL — ABNORMAL HIGH (ref 0.3–1.2)
Total Protein: 8.1 g/dL (ref 6.5–8.1)

## 2021-06-11 LAB — CBC WITH DIFFERENTIAL/PLATELET
Abs Immature Granulocytes: 0.09 10*3/uL — ABNORMAL HIGH (ref 0.00–0.07)
Basophils Absolute: 0.1 10*3/uL (ref 0.0–0.1)
Basophils Relative: 0 %
Eosinophils Absolute: 0 10*3/uL (ref 0.0–0.5)
Eosinophils Relative: 0 %
HCT: 51.8 % (ref 39.0–52.0)
Hemoglobin: 17.9 g/dL — ABNORMAL HIGH (ref 13.0–17.0)
Immature Granulocytes: 1 %
Lymphocytes Relative: 5 %
Lymphs Abs: 0.7 10*3/uL (ref 0.7–4.0)
MCH: 31.4 pg (ref 26.0–34.0)
MCHC: 34.6 g/dL (ref 30.0–36.0)
MCV: 90.9 fL (ref 80.0–100.0)
Monocytes Absolute: 1.3 10*3/uL — ABNORMAL HIGH (ref 0.1–1.0)
Monocytes Relative: 9 %
Neutro Abs: 12.8 10*3/uL — ABNORMAL HIGH (ref 1.7–7.7)
Neutrophils Relative %: 85 %
Platelets: 312 10*3/uL (ref 150–400)
RBC: 5.7 MIL/uL (ref 4.22–5.81)
RDW: 14.8 % (ref 11.5–15.5)
WBC: 15 10*3/uL — ABNORMAL HIGH (ref 4.0–10.5)
nRBC: 0 % (ref 0.0–0.2)

## 2021-06-11 LAB — LIPASE, BLOOD: Lipase: 26 U/L (ref 11–51)

## 2021-06-11 MED ORDER — SODIUM CHLORIDE 0.9 % IV BOLUS
1000.0000 mL | Freq: Once | INTRAVENOUS | Status: AC
  Administered 2021-06-11: 1000 mL via INTRAVENOUS

## 2021-06-11 MED ORDER — FOLIC ACID 5 MG/ML IJ SOLN
INTRAMUSCULAR | Status: AC
  Filled 2021-06-11: qty 0.2

## 2021-06-11 MED ORDER — ONDANSETRON 4 MG PO TBDP
4.0000 mg | ORAL_TABLET | Freq: Once | ORAL | Status: AC | PRN
Start: 2021-06-11 — End: 2021-06-11
  Administered 2021-06-11: 4 mg via ORAL

## 2021-06-11 MED ORDER — LORAZEPAM 2 MG/ML IJ SOLN: 1.0000 mg | INTRAMUSCULAR | Status: DC | PRN

## 2021-06-11 MED ORDER — ONDANSETRON HCL 4 MG/2ML IJ SOLN: 4.0000 mg | Freq: Four times a day (QID) | INTRAMUSCULAR | Status: DC | PRN

## 2021-06-11 MED ORDER — METOPROLOL TARTRATE 5 MG/5ML IV SOLN
2.5000 mg | Freq: Four times a day (QID) | INTRAVENOUS | Status: DC
  Administered 2021-06-11 – 2021-06-14 (×11): 2.5 mg via INTRAVENOUS
  Filled 2021-06-11 (×12): qty 5

## 2021-06-11 MED ORDER — LORAZEPAM 1 MG PO TABS: 1.0000 mg | ORAL_TABLET | ORAL | Status: DC | PRN

## 2021-06-11 MED ORDER — SODIUM CHLORIDE 0.9 % IV SOLN
1.0000 mg | Freq: Every day | INTRAVENOUS | Status: DC
  Administered 2021-06-11: 1 mg via INTRAVENOUS
  Filled 2021-06-11 (×2): qty 0.2

## 2021-06-11 MED ORDER — LORAZEPAM 2 MG/ML IJ SOLN
0.5000 mg | Freq: Four times a day (QID) | INTRAMUSCULAR | Status: DC
  Administered 2021-06-11 – 2021-06-15 (×15): 0.5 mg via INTRAVENOUS
  Filled 2021-06-11 (×15): qty 1

## 2021-06-11 MED ORDER — THIAMINE HCL 100 MG/ML IJ SOLN
100.0000 mg | Freq: Every day | INTRAMUSCULAR | Status: DC
  Administered 2021-06-11 – 2021-06-15 (×5): 100 mg via INTRAVENOUS
  Filled 2021-06-11 (×5): qty 2

## 2021-06-11 MED ORDER — LACTATED RINGERS IV SOLN
INTRAVENOUS | Status: DC
Start: 2021-06-11 — End: 2021-06-15

## 2021-06-11 MED ORDER — SODIUM CHLORIDE 0.9 % IV BOLUS: 1000.0000 mL | Freq: Once | INTRAVENOUS | Status: DC

## 2021-06-11 MED ORDER — ONDANSETRON HCL 4 MG PO TABS: 4.0000 mg | ORAL_TABLET | Freq: Four times a day (QID) | ORAL | Status: DC | PRN

## 2021-06-11 MED ORDER — HYDROMORPHONE HCL 1 MG/ML IJ SOLN
1.0000 mg | Freq: Once | INTRAMUSCULAR | Status: AC
  Administered 2021-06-11: 1 mg via INTRAVENOUS
  Filled 2021-06-11: qty 1

## 2021-06-11 MED ORDER — LIDOCAINE VISCOUS HCL 2 % MT SOLN
15.0000 mL | Freq: Once | OROMUCOSAL | Status: AC
  Administered 2021-06-11: 15 mL via OROMUCOSAL
  Filled 2021-06-11: qty 15

## 2021-06-11 MED ORDER — METHYLPREDNISOLONE SODIUM SUCC 40 MG IJ SOLR
40.0000 mg | Freq: Every day | INTRAMUSCULAR | Status: DC
Start: 2021-06-11 — End: 2021-06-14
  Administered 2021-06-11 – 2021-06-13 (×3): 40 mg via INTRAVENOUS
  Filled 2021-06-11 (×3): qty 1

## 2021-06-11 MED ORDER — ONDANSETRON 4 MG PO TBDP
ORAL_TABLET | ORAL | Status: AC
  Filled 2021-06-11: qty 1

## 2021-06-11 MED ORDER — HYDROMORPHONE HCL 1 MG/ML IJ SOLN
0.5000 mg | INTRAMUSCULAR | Status: DC | PRN
  Administered 2021-06-12 (×4): 1 mg via INTRAVENOUS
  Administered 2021-06-13: 0.5 mg via INTRAVENOUS
  Administered 2021-06-13: 1 mg via INTRAVENOUS
  Filled 2021-06-11 (×5): qty 1
  Filled 2021-06-11: qty 0.5

## 2021-06-11 MED ORDER — ENOXAPARIN SODIUM 40 MG/0.4ML IJ SOSY
40.0000 mg | PREFILLED_SYRINGE | INTRAMUSCULAR | Status: DC
  Administered 2021-06-14: 40 mg via SUBCUTANEOUS
  Filled 2021-06-11 (×3): qty 0.4

## 2021-06-11 MED ORDER — IOHEXOL 300 MG/ML  SOLN
100.0000 mL | Freq: Once | INTRAMUSCULAR | Status: AC | PRN
  Administered 2021-06-11: 100 mL via INTRAVENOUS

## 2021-06-11 NOTE — ED Provider Triage Note (Signed)
Emergency Medicine Provider Triage Evaluation Note  Logan Alvarez , a 66 y.o. male  was evaluated in triage.  Pt complains of abdominal pain.  Started a few hours ago, associated with 7 episodes of emesis.  Patient had a bowel movement this morning, has not passed gas since then.  Feels like his abdomen is distended, pain is all over the abdomen but worse on the left lower quadrant.  Undergoes chemo, last dose 5/2  Review of Systems  PER HPI  Physical Exam  BP 131/78   Pulse 96   Temp 97.9 F (36.6 C) (Oral)   Resp 18   Ht '5\' 8"'$  (1.727 m)   Wt 81.6 kg   SpO2 100%   BMI 27.37 kg/m  Gen:   Awake, no distress   Resp:  Normal effort  MSK:   Moves extremities without difficulty  Other:  Abdomen soft but slightly distended, diffuse tenderness but worse in the left lower quadrant  Medical Decision Making  Medically screening exam initiated at 4:10 PM.  Appropriate orders placed.  KAZUO DURNIL was informed that the remainder of the evaluation will be completed by another provider, this initial triage assessment does not replace that evaluation, and the importance of remaining in the ED until their evaluation is complete.     Sherrill Raring, PA-C 06/11/21 1610

## 2021-06-11 NOTE — ED Triage Notes (Signed)
Pt reports abd pain and vomiting that started last night.  Reports several episodes of vomiting.  Pt is ca pt and is taking immunotherapy.  Last treatment 5/02.  Alert and oriented.  Resp even and unlabored.  Skin warm and dry.  nad ?

## 2021-06-11 NOTE — ED Notes (Signed)
Report called to Candace, RN on [phone] ?

## 2021-06-11 NOTE — Progress Notes (Signed)
Called by the ED provider regarding patient.  He presents with a 1 day history of abdominal pain, nausea, and vomiting.  He has a past medical history significant for hepatocellular carcinoma with peritoneal carcinomatosis for which she is currently receiving chemotherapy.  He underwent a CT abdomen and pelvis which demonstrates dilated loops of small bowel compatible with small bowel obstruction with a transition point not clearly defined but likely in the right abdomen associated with mesenteric thickening and peritoneal disease, consistent with peritoneal carcinomatosis.  New ascites was also noted on the CT scan.  Patient is hemodynamically stable.  He has a leukocytosis of 15.8.  Patient is, per chart, taking Plavix versus Brilinta. ? ?Plan: ?-Admit to hospitalist ?-NG to LIS ?-NPO ?-IV fluids ?-Patient is a poor surgical candidate, as small bowel obstruction is secondary to his peritoneal carcinomatosis ?-Hold any blood thinners the patient is taking at home ?-Palliative care consult to discuss goals of care ?-I will plan to see the patient in the morning.  Please call with any questions or concerns ? ?Tanielle Emigh, DO ?[facility] ?[place], [place] [address] ?[phone] (office) ? ?

## 2021-06-11 NOTE — ED Provider Notes (Signed)
[facility] Provider Note   CSN: [id number] Arrival date & time: 06/11/21  1358     History  Chief Complaint  Patient presents with   Abdominal Pain    Logan Alvarez is a 66 y.o. male.   Abdominal Pain   Patient complains of abdominal pain.  Started a few hours ago, associated with 7 episodes of emesis.  Patient had a bowel movement this morning, has not passed gas since then.  Feels like his abdomen is distended, pain is all over the abdomen but worse on the left lower quadrant.  Undergoes chemo, last dose 5/2.  PMH notable for hepatocellular carcinoma, HTN, HLD.  Home Medications Prior to Admission medications   Medication Sig Start Date End Date Taking? Authorizing Provider  ALPRAZolam Duanne Moron) 0.5 MG tablet Take one tablet po QID 04/29/21  Yes Luking, Elayne Snare, MD  aspirin EC 81 MG tablet Take 1 tablet (81 mg total) by mouth daily. Swallow whole. 12/04/20  Yes Kristopher Oppenheim, DO  clopidogrel (PLAVIX) 75 MG tablet Take 1 tablet (75 mg total) by mouth daily. 06/02/21  Yes Strader, [place] M, PA-C  hydrocortisone (ANUSOL-HC) 2.5 % rectal cream Place 1 application. rectally 2 (two) times daily. 05/08/21   Eloise Harman, DO  lidocaine (XYLOCAINE) 2 % solution Use as directed 15 mLs in the mouth or throat every 4 (four) hours as needed for mouth pain. Swish and spit along with 15 mL of Carafate every 4 hours as needed for mild pain. 02/25/21   Derek Jack, MD  losartan (COZAAR) 25 MG tablet Take 1 tablet (25 mg total) by mouth in the morning. 03/05/21   Kathyrn Drown, MD  metoprolol tartrate (LOPRESSOR) 25 MG tablet Take 0.5 tablets (12.5 mg total) by mouth 2 (two) times daily. 12/09/20 02/05/23  Kathyrn Drown, MD  nitroGLYCERIN (NITROSTAT) 0.4 MG SL tablet Place 1 tablet (0.4 mg total) under the tongue every 5 (five) minutes as needed for chest pain. 12/13/20   Strader, Fransisco Hertz, PA-C  nystatin (MYCOSTATIN) 100000 UNIT/ML suspension 5 ml swish and swallow for  7 days 04/29/21   Kathyrn Drown, MD  oxyCODONE (OXY IR/ROXICODONE) 5 MG immediate release tablet Take 1 tablet (5 mg total) by mouth every 4 (four) hours as needed (pain). 04/29/21   Kathyrn Drown, MD  predniSONE (DELTASONE) 20 MG tablet Take 0.5 tablets (10 mg total) by mouth daily with breakfast. 03/13/21   Derek Jack, MD  prochlorperazine (COMPAZINE) 10 MG tablet Take 1 tablet (10 mg total) by mouth every 6 (six) hours as needed for nausea or vomiting. 05/08/21   Derek Jack, MD  sodium chloride (OCEAN) 0.65 % nasal spray 1 spray as needed for congestion.    [provider]  sucralfate (CARAFATE) 1 GM/10ML suspension Take 10 mLs (1 g total) by mouth 4 (four) times daily -  with meals and at bedtime. Mix 10 mL of Carafate with 15 mL of lidocaine, swish and spit every 4 hours as needed for mild pain. 02/25/21   Derek Jack, MD  Vitamin D, Ergocalciferol, (DRISDOL) 1.25 MG (50000 UNIT) CAPS capsule TAKE 1 CAPSULE BY MOUTH WEEKLY Patient taking differently: Take 50,000 Units by mouth every Monday. TAKE 1 CAPSULE BY MOUTH WEEKLY 01/31/21   Kathyrn Drown, MD      Allergies    Cymbalta [duloxetine hcl], Lodine [etodolac], and Zoloft [sertraline hcl]    Review of Systems   Review of Systems  Gastrointestinal:  Positive for  abdominal pain.   Physical Exam Updated Vital Signs BP (!) 156/106 (BP Location: Left Arm)   Pulse 100   Temp 98.1 F (36.7 C) (Oral)   Resp 19   Ht '5\' 8"'$  (1.727 m)   Wt 81.6 kg   SpO2 99%   BMI 27.37 kg/m  Physical Exam Vitals and nursing note reviewed. Exam conducted with a chaperone present.  Constitutional:      Appearance: Normal appearance.  HENT:     Head: Normocephalic and atraumatic.  Eyes:     General: No scleral icterus.       Right eye: No discharge.        Left eye: No discharge.     Extraocular Movements: Extraocular movements intact.     Pupils: Pupils are equal, round, and reactive to light.  Cardiovascular:      Rate and Rhythm: Normal rate and regular rhythm.     Pulses: Normal pulses.     Heart sounds: Normal heart sounds. No murmur heard.   No friction rub. No gallop.  Pulmonary:     Effort: Pulmonary effort is normal. No respiratory distress.     Breath sounds: Normal breath sounds.  Abdominal:     General: Abdomen is flat. A surgical scar is present. Bowel sounds are normal. There is distension.     Palpations: Abdomen is soft.     Tenderness: There is generalized abdominal tenderness and tenderness in the left lower quadrant.  Skin:    General: Skin is warm and dry.     Coloration: Skin is not jaundiced.  Neurological:     Mental Status: He is alert. Mental status is at baseline.     Coordination: Coordination normal.    ED Results / Procedures / Treatments   Labs (all labs ordered are listed, but only abnormal results are displayed) Labs Reviewed  COMPREHENSIVE METABOLIC PANEL - Abnormal; Notable for the following components:      Result Value   Sodium 133 (*)    Chloride 92 (*)    Glucose, Bld 172 (*)    BUN 7 (*)    Total Bilirubin 2.0 (*)    All other components within normal limits  URINALYSIS, ROUTINE W REFLEX MICROSCOPIC - Abnormal; Notable for the following components:   Specific Gravity, Urine >1.046 (*)    Protein, ur 100 (*)    All other components within normal limits  CBC WITH DIFFERENTIAL/PLATELET - Abnormal; Notable for the following components:   WBC 15.0 (*)    Hemoglobin 17.9 (*)    Neutro Abs 12.8 (*)    Monocytes Absolute 1.3 (*)    Abs Immature Granulocytes 0.09 (*)    All other components within normal limits  LIPASE, BLOOD    EKG None  Radiology CT Abdomen Pelvis W Contrast  Result Date: 06/11/2021 CLINICAL DATA:  Abdominal pain. Bowel obstruction suspected. History of hepatocellular carcinoma with peritoneal carcinomatosis. History of partial hepatectomy. EXAM: CT ABDOMEN AND PELVIS WITH CONTRAST TECHNIQUE: Multidetector CT imaging of the  abdomen and pelvis was performed using the standard protocol following bolus administration of intravenous contrast. RADIATION DOSE REDUCTION: This exam was performed according to the departmental dose-optimization program which includes automated exposure control, adjustment of the mA and/or kV according to patient size and/or use of iterative reconstruction technique. CONTRAST:  180m OMNIPAQUE IOHEXOL 300 MG/ML  SOLN COMPARISON:  CT chest abdomen pelvis 05/22/2021 FINDINGS: Lower chest: Lung bases are clear.  No pleural effusions. Hepatobiliary: Subcentimeter hypodensities in the liver  are again noted. History of partial hepatectomy involving segments 5 and 6. Main portal venous system is patent. No significant biliary dilatation. Gallbladder appears to be surgically absent. Liver has a slightly nodular contour and compatible with underlying cirrhosis. Pancreas: Unremarkable. No pancreatic ductal dilatation or surrounding inflammatory changes. Spleen: Normal in size without focal abnormality. Adrenals/Urinary Tract: Normal adrenal glands. Again noted is a low-density structure in the right kidney lower pole that is suggestive for a cyst and does not require dedicated follow-up. No suspicious renal lesions. Negative for hydronephrosis. Mild distention of the urinary bladder. Stomach/Bowel: Small hiatal hernia. Stomach is mildly distended with fluid. Multiple fluid-filled loops of small bowel abdomen measuring up to 3.5 cm in diameter. Distal small bowel is decompressed. Findings are suggestive for a small bowel obstruction. Transition point is likely in the right abdomen near sequence 2 image 54. In addition, there may be distended or fluid-filled appendix which is similar to the prior examination. Vascular/Lymphatic: Atherosclerotic calcifications in the aorta and iliac arteries without aneurysm. Main visceral arteries are patent. No significant lymph node enlargement in the abdomen or pelvis. Reproductive:  Prostate is unremarkable. Other: Again noted is abnormal soft tissue involving the anterior peritoneum and omentum. There is also soft tissue thickening or fullness along the right abdominal mesentery which is probably near the small bowel transition point. Findings are compatible with peritoneal carcinomatosis. Small amount of ascites in the abdomen which is new. Negative for free air. Musculoskeletal: Stable sclerosis involving the left iliac wing. No acute bone abnormality. Stable compression deformity involving the L1 vertebral body. No acute bone abnormality. IMPRESSION: 1. Dilated loops of small bowel are most compatible with a small bowel obstruction. Distal small bowel is decompressed and transition point is not clearly identified but probably in the right abdomen associated with mesenteric thickening and peritoneal disease. 2. Again noted is abnormal peritoneal and mesenteric soft tissue that is compatible with peritoneal carcinomatosis. Small amount of ascites is new. 3.  Aortic Atherosclerosis (ICD10-I70.0). 4. Cirrhosis. Electronically Signed   By: Markus Daft M.D.   On: 06/11/2021 18:04    Procedures Procedures    Medications Ordered in ED Medications  sodium chloride 0.9 % bolus 1,000 mL (has no administration in time range)  ondansetron (ZOFRAN-ODT) disintegrating tablet 4 mg (4 mg Oral Given 06/11/21 1452)  iohexol (OMNIPAQUE) 300 MG/ML solution 100 mL (100 mLs Intravenous Contrast Given 06/11/21 1727)    ED Course/ Medical Decision Making/ A&P Clinical Course as of 06/11/21 1902  Wed Jun 11, 2021  1842 Hold blood thinners, NPO, admit to medicine [HS]  1843 Palliative care consult also requested by surgery [HS]    Clinical Course User Index [HS] Sherrill Raring, PA-C                           Medical Decision Making Amount and/or Complexity of Data Reviewed Labs: ordered. Radiology: ordered.  Risk Prescription drug management. Decision regarding hospitalization.   Patient  presented with nausea, vomiting and abdominal pain.  Differential diagnosis includes but not limited to small bowel obstruction, fecal impaction, colitis.  On exam he is distended with generalized tenderness worse in the left lower quadrant.  Vital signs are stable though he is slightly hypertensive and his heart rate is slightly elevated though not technically tachycardic.  Given history provided by the patient's family at bedside specifically his daughter.  External records reviewed, he is currently undergoing chemotherapy due to hepatocarcinoma.  Patient has a  leukocytosis of 15.  There is no gross electrolyte derangement or AKI though he is hyperglycemic 172.  I ordered, reviewed CT abdomen and pelvis.  Agree with radiologist interpretation of small bowel obstruction.  I consulted general surgeon Dr. Okey Dupre and discussed the case.  She reviewed the imaging and does not feel patient would be a good surgical candidate.  Advises NG tube, n.p.o., fluids, admission to hospitalist and holding any anticoagulation.  She is also requesting palliative care be consulted.  I will reach out to the hospitalist service.        Final Clinical Impression(s) / ED Diagnoses Final diagnoses:  SBO (small bowel obstruction) [facility])    Rx / DC Orders ED Discharge Orders     None         Sherrill Raring, Hershal Coria 06/11/21 1902    Luna Fuse, MD 06/20/21 [phone]

## 2021-06-11 NOTE — Progress Notes (Signed)
Patient's daughter Vito Backers called stating that patient has been really nauseous and has vomited about 4-5 times and is having abdominal cramping. Advised her that he needs to go to the emergency department. Patient's daughter is agreeable. ?

## 2021-06-11 NOTE — H&P (Signed)
? ?                                                                                                       [facility] H&P ? ? Patient Demographics:  ? ? Logan Alvarez, is a 66 y.o. male  MRN: [id number]   DOB - 03-11-1955 ? ?Admit Date - 06/11/2021 ? ?Outpatient Primary MD for the patient is Kathyrn Drown, MD ? ?Referring MD/NP/PA: PA Sage ? ?Outpatient Specialists: Oncology Dr. Delton Coombes ? ?Patient coming from: Home ? ?Chief Complaint  ?Patient presents with  ? Abdominal Pain  ?  ? ? HPI:  ? ? Logan Alvarez  is a 66 y.o. male,  with medical history significant for stage IV hepatocellular carcinoma, coronary artery disease, hyperlipidemia, hypertension, testosterone deficiency who who presents due to complaints of abdominal pain, nausea and vomiting, reports abdominal pain over the last 24 hours, now just over last few days, he reports he did develop vomiting, where he had 7 episodes of vomiting so far, he denies any coffee-ground emesis, any melena, or bright red blood per rectum, no diarrhea, reports he is passing gas, he had BM today, patient currently on chemotherapy for his epidural carcinoma with carcinomatosis, managed by Dr. Delton Coombes, last dose 5/2 ?-In ED work-up significant for leukocytosis at 15K, sodium low at 133, CT abdomen pelvis significant for small bowel obstruction related to carcinomatosis, ED discussed with general surgery who recommended NG tube insertion on ILS, and hospitalist to admit for further management. ? ? ? Review of systems:  ? ? ?A full 10 point Review of Systems was done, except as stated above, all other Review of Systems were negative. ? ? ?With Past History of the following :  ? ? ?Past Medical History:  ?Diagnosis Date  ? CAD (coronary artery disease)   ? a. 01/2013: cath showing nonobstructive disease. b. 11/2020: NSTEMI with DES to mid-LCx and medical management recommended of distal LCx disease given small vessel size  ? Hepatocellular carcinoma ([place])   ? Stage IV  ? Hyperlipidemia    ? Hypertension   ? Testosterone deficiency 2009  ?   ? ?Past Surgical History:  ?Procedure Laterality Date  ? BALLOON DILATION  07/30/2020  ? Procedure: BALLOON DILATION;  Surgeon: Eloise Harman, DO;  Location: [facility];  Service: Endoscopy;;  ? COLONOSCOPY N/A 03/12/2017  ? Procedure: COLONOSCOPY;  Surgeon: Danie Binder, MD;  Location: [facility];  Service: Endoscopy;  Laterality: N/A;  2:00  ? COLONOSCOPY WITH PROPOFOL N/A 06/18/2020  ? Procedure: COLONOSCOPY WITH PROPOFOL;  Surgeon: Eloise Harman, DO;  Location: [facility];  Service: Endoscopy;  Laterality: N/A;  pm appt  ? CORONARY STENT INTERVENTION N/A 12/03/2020  ? Procedure: CORONARY STENT INTERVENTION;  Surgeon: Jettie Booze, MD;  Location: [facility];  Service: Cardiovascular;  Laterality: N/A;  ? ESOPHAGOGASTRODUODENOSCOPY (EGD) WITH PROPOFOL N/A 07/30/2020  ? Procedure: ESOPHAGOGASTRODUODENOSCOPY (EGD) WITH PROPOFOL;  Surgeon: Eloise Harman, DO;  Location: [facility];  Service: Endoscopy;  Laterality: N/A;  11:45am  ?  LEFT HEART CATH AND CORONARY ANGIOGRAPHY N/A 12/03/2020  ? Procedure: LEFT HEART CATH AND CORONARY ANGIOGRAPHY;  Surgeon: Jettie Booze, MD;  Location: [facility];  Service: Cardiovascular;  Laterality: N/A;  ? LEFT HEART CATHETERIZATION WITH CORONARY ANGIOGRAM N/A 02/01/2013  ? Procedure: LEFT HEART CATHETERIZATION WITH CORONARY ANGIOGRAM;  Surgeon: Josue Hector, MD;  Location[facility] C[facility];  Service: Cardiovascular;  Laterality: N/A;  ? NECK SURGERY    ? POLYPECTOMY  06/18/2020  ? Procedure: POLYPECTOMY;  Surgeon: Eloise Harman, DO;  Location: [facility];  Service: Endoscopy;;  snare and biospy  ? ? ? ? Social History:  ? ?  ?Social History  ? ?Tobacco Use  ? Smoking status: Never  ? Smokeless tobacco: Never  ?Substance Use Topics  ? Alcohol use: Not Currently  ?  Comment: 4-6 beers a day  ?  ? ? Family History :  ? ?  ?Family History  ?Problem Relation Age of Onset  ? Heart  attack Father   ?     67's  ? ? ? ? Home Medications:  ? ?Prior to Admission medications   ?Medication Sig Start Date End Date Taking? Authorizing Provider  ?ALPRAZolam (XANAX) 0.5 MG tablet Take one tablet po QID 04/29/21  Yes Luking, Elayne Snare, MD  ?aspirin EC 81 MG tablet Take 1 tablet (81 mg total) by mouth daily. Swallow whole. 12/04/20  Yes Kristopher Oppenheim, DO  ?clopidogrel (PLAVIX) 75 MG tablet Take 1 tablet (75 mg total) by mouth daily. 06/02/21  Yes Strader, [place] M, PA-C  ?hydrocortisone (ANUSOL-HC) 2.5 % rectal cream Place 1 application. rectally 2 (two) times daily. 05/08/21  Yes Eloise Harman, DO  ?losartan (COZAAR) 25 MG tablet Take 1 tablet (25 mg total) by mouth in the morning. 03/05/21  Yes Kathyrn Drown, MD  ?metoprolol tartrate (LOPRESSOR) 25 MG tablet Take 0.5 tablets (12.5 mg total) by mouth 2 (two) times daily. 12/09/20 02/05/23 Yes Kathyrn Drown, MD  ?nitroGLYCERIN (NITROSTAT) 0.4 MG SL tablet Place 1 tablet (0.4 mg total) under the tongue every 5 (five) minutes as needed for chest pain. 12/13/20  Yes Strader, Fransisco Hertz, PA-C  ?oxyCODONE (OXY IR/ROXICODONE) 5 MG immediate release tablet Take 1 tablet (5 mg total) by mouth every 4 (four) hours as needed (pain). 04/29/21  Yes Kathyrn Drown, MD  ?predniSONE (DELTASONE) 20 MG tablet Take 0.5 tablets (10 mg total) by mouth daily with breakfast. 03/13/21  Yes Derek Jack, MD  ?prochlorperazine (COMPAZINE) 10 MG tablet Take 1 tablet (10 mg total) by mouth every 6 (six) hours as needed for nausea or vomiting. 05/08/21  Yes Derek Jack, MD  ?sodium chloride (OCEAN) 0.65 % nasal spray 1 spray as needed for congestion.   Yes [provider]  ?lidocaine (XYLOCAINE) 2 % solution Use as directed 15 mLs in the mouth or throat every 4 (four) hours as needed for mouth pain. Swish and spit along with 15 mL of Carafate every 4 hours as needed for mild pain. ?Patient not taking: Reported on 06/11/2021 02/25/21   Derek Jack, MD  ?nystatin  (MYCOSTATIN) 100000 UNIT/ML suspension 5 ml swish and swallow for 7 days ?Patient not taking: Reported on 06/11/2021 04/29/21   Kathyrn Drown, MD  ?sucralfate (CARAFATE) 1 GM/10ML suspension Take 10 mLs (1 g total) by mouth 4 (four) times daily -  with meals and at bedtime. Mix 10 mL of Carafate with 15 mL of lidocaine, swish and spit every 4  hours as needed for mild pain. ?Patient not taking: Reported on 06/11/2021 02/25/21   Derek Jack, MD  ?Vitamin D, Ergocalciferol, (DRISDOL) 1.25 MG (50000 UNIT) CAPS capsule TAKE 1 CAPSULE BY MOUTH WEEKLY ?Patient not taking: Reported on 06/11/2021 01/31/21   Kathyrn Drown, MD  ? ? ? Allergies:  ? ?  ?Allergies  ?Allergen Reactions  ? Cymbalta [Duloxetine Hcl]   ?  Bad dreams  ? Lodine [Etodolac] Nausea And Vomiting  ? Zoloft [Sertraline Hcl]   ?  REDUCED URINARY FLOW,NOCTURIA  ? ? ? Physical Exam:  ? ?Vitals ? ?Blood pressure (!) 156/106, pulse 100, temperature 98.1 ?F (36.7 ?C), temperature source Oral, resp. rate 19, height '5\' 8"'$  (1.727 m), weight 81.6 kg, SpO2 99 %. ? ? ?1. General well-developed male, in mild discomfort ? ?2. Normal affect and insight, Not Suicidal or Homicidal, Awake Alert, Oriented X 3. ? ?3. No F.N deficits, ALL C.Nerves Intact, Strength 5/5 all 4 extremities, Sensation intact all 4 extremities, Plantars down going. ? ?4. Ears and Eyes appear Normal, Conjunctivae clear, PERRLA. Moist Oral Mucosa. ? ?5. Supple Neck, No JVD, No cervical lymphadenopathy appriciated, No Carotid Bruits. ? ?6. Symmetrical Chest wall movement, Good air movement bilaterally, CTAB. ? ?7. RRR, No Gallops, Rubs or Murmurs, No Parasternal Heave. ? ?8.  abdomen tender to palpation, increased bowel sounds.   ?Mildly distended ? ?9.  No Cyanosis, Normal Skin Turgor, No Skin Rash or Bruise. ? ?10. Good muscle tone,  joints appear normal , no effusions, Normal ROM. ? ? ? ? ? Data Review:  ? ? CBC ?Recent Labs  ?Lab 06/11/21 ?0865  ?WBC 15.0*  ?HGB 17.9*  ?HCT 51.8  ?PLT 312   ?MCV 90.9  ?MCH 31.4  ?MCHC 34.6  ?RDW 14.8  ?LYMPHSABS 0.7  ?MONOABS 1.3*  ?EOSABS 0.0  ?BASOSABS 0.1  ? ?-----------------------------------------------------------------------------------------------

## 2021-06-12 ENCOUNTER — Encounter: Payer: Self-pay | Admitting: Hematology

## 2021-06-12 DIAGNOSIS — E782 Mixed hyperlipidemia: Secondary | ICD-10-CM | POA: Diagnosis not present

## 2021-06-12 DIAGNOSIS — F1029 Alcohol dependence with unspecified alcohol-induced disorder: Secondary | ICD-10-CM | POA: Diagnosis not present

## 2021-06-12 DIAGNOSIS — Z7189 Other specified counseling: Secondary | ICD-10-CM

## 2021-06-12 DIAGNOSIS — Z515 Encounter for palliative care: Secondary | ICD-10-CM | POA: Diagnosis not present

## 2021-06-12 DIAGNOSIS — K766 Portal hypertension: Secondary | ICD-10-CM

## 2021-06-12 DIAGNOSIS — C220 Liver cell carcinoma: Secondary | ICD-10-CM | POA: Diagnosis not present

## 2021-06-12 DIAGNOSIS — I25118 Atherosclerotic heart disease of native coronary artery with other forms of angina pectoris: Secondary | ICD-10-CM | POA: Diagnosis not present

## 2021-06-12 DIAGNOSIS — K56609 Unspecified intestinal obstruction, unspecified as to partial versus complete obstruction: Secondary | ICD-10-CM | POA: Diagnosis not present

## 2021-06-12 DIAGNOSIS — F1020 Alcohol dependence, uncomplicated: Secondary | ICD-10-CM | POA: Diagnosis present

## 2021-06-12 DIAGNOSIS — C786 Secondary malignant neoplasm of retroperitoneum and peritoneum: Secondary | ICD-10-CM | POA: Diagnosis not present

## 2021-06-12 LAB — CBC
HCT: 48.9 % (ref 39.0–52.0)
Hemoglobin: 17 g/dL (ref 13.0–17.0)
MCH: 31.8 pg (ref 26.0–34.0)
MCHC: 34.8 g/dL (ref 30.0–36.0)
MCV: 91.4 fL (ref 80.0–100.0)
Platelets: 274 10*3/uL (ref 150–400)
RBC: 5.35 MIL/uL (ref 4.22–5.81)
RDW: 15.2 % (ref 11.5–15.5)
WBC: 12.6 10*3/uL — ABNORMAL HIGH (ref 4.0–10.5)
nRBC: 0 % (ref 0.0–0.2)

## 2021-06-12 LAB — BASIC METABOLIC PANEL
Anion gap: 9 (ref 5–15)
BUN: 8 mg/dL (ref 8–23)
CO2: 28 mmol/L (ref 22–32)
Calcium: 9.6 mg/dL (ref 8.9–10.3)
Chloride: 98 mmol/L (ref 98–111)
Creatinine, Ser: 0.56 mg/dL — ABNORMAL LOW (ref 0.61–1.24)
GFR, Estimated: >60 mL/min (ref >60)
Glucose, Bld: 166 mg/dL — ABNORMAL HIGH (ref 70–99)
Potassium: 4.2 mmol/L (ref 3.5–5.1)
Sodium: 135 mmol/L (ref 135–145)

## 2021-06-12 MED ORDER — KETOROLAC TROMETHAMINE 15 MG/ML IJ SOLN
15.0000 mg | Freq: Four times a day (QID) | INTRAMUSCULAR | Status: DC
  Administered 2021-06-12 – 2021-06-14 (×9): 15 mg via INTRAVENOUS
  Filled 2021-06-12 (×9): qty 1

## 2021-06-12 MED ORDER — PANTOPRAZOLE SODIUM 40 MG IV SOLR
40.0000 mg | Freq: Two times a day (BID) | INTRAVENOUS | Status: DC
Start: 2021-06-12 — End: 2021-06-14
  Administered 2021-06-12 – 2021-06-14 (×5): 40 mg via INTRAVENOUS
  Filled 2021-06-12 (×5): qty 10

## 2021-06-12 MED ORDER — FOLIC ACID 5 MG/ML IJ SOLN
1.0000 mg | Freq: Every day | INTRAMUSCULAR | Status: DC
  Administered 2021-06-12 – 2021-06-15 (×4): 1 mg via INTRAVENOUS
  Filled 2021-06-12 (×4): qty 0.2

## 2021-06-12 MED ORDER — PHENOL 1.4 % MT LIQD
1.0000 | OROMUCOSAL | Status: DC | PRN
  Administered 2021-06-12: 1 via OROMUCOSAL
  Filled 2021-06-12 (×2): qty 177

## 2021-06-12 NOTE — Progress Notes (Signed)
Patient stated he passed flatus 1 time and burped four times so far.  ?

## 2021-06-12 NOTE — Assessment & Plan Note (Addendum)
--   temporarily held aspirin / plavix just in case he needs procedures ?-- can resume aspirin/plavix now that bowel obstruction resolved ?

## 2021-06-12 NOTE — Assessment & Plan Note (Addendum)
--   has been diet controlled recently ?

## 2021-06-12 NOTE — Assessment & Plan Note (Signed)
--   continue beta blocker therapy  ?

## 2021-06-12 NOTE — Assessment & Plan Note (Addendum)
--   secondary to carcinomatosis ?-- NG tube removed and now tolerating soft diet ?-- Pt having flatus and BM, diet advanced per surgery  ?-- appreciate surgery consultation ?-- Pt reports 2 bowel movements and insists on going home today.  ?-- pt and family made aware that he remains at high risk for recurrence of bowel obstruction and verbalized understanding ?

## 2021-06-12 NOTE — Assessment & Plan Note (Signed)
--   he is on lorazepam  ?

## 2021-06-12 NOTE — Progress Notes (Signed)
TOC consulted for SA resources. Patient stated that his alcohol problems "ended a long time ago." He stated that he does not have a problem with alcohol use and declines resources.  ? ? ?Koa Palla D, LCSW. ?

## 2021-06-12 NOTE — Consult Note (Addendum)
[facility] Consult ? ?Reason for Consult: Small bowel obstruction ?Referring Physician: Sherrill Raring, PA-C ? ?Chief Complaint   ?Abdominal Pain ?  ? ? ?HPI: Logan Alvarez is a 66 y.o. male who was admitted with small bowel obstruction.  He states he was having abdominal pain, nausea, and vomiting that started at 3 AM.  He has never had pain like this before, denies any history of bowel obstructions.  He states that he has not passed flatus in a while, and his last bowel movement was yesterday morning.  He has a past medical history significant for coronary artery disease status post stent placement 6 months ago, on Plavix, hepatocellular carcinoma status post partial hepatectomy 3 years ago, hypertension, and hyperlipidemia.  He was recently diagnosed with peritoneal carcinomatosis, and was started on chemotherapy and immunotherapy.  He has had 900 cc of feculent output through his NG tube since placement.  He denies current nausea and vomiting, though the NG tube is irritating the back of his throat.  His abdominal pain is resolved.  He denies use of tobacco products and illicit drugs.  He does drink at least 2-3 beers daily. ? ?In the ED, he underwent a CT abdomen and pelvis which demonstrates dilated loops of small bowel compatible with small bowel obstruction with transition point not clearly identified but probably in the right abdomen associated with mesenteric thickening and peritoneal disease; redemonstration of abnormal peritoneal and mesenteric soft tissue compatible with peritoneal carcinomatosis, new ascites, cirrhosis.  He had a leukocytosis of 15 upon admission and normal LFTs.  He has been hemodynamically stable.  ? ?Past Medical History:  ?Diagnosis Date  ? CAD (coronary artery disease)   ? a. 01/2013: cath showing nonobstructive disease. b. 11/2020: NSTEMI with DES to mid-LCx and medical management recommended of distal LCx disease given small vessel size  ? Hepatocellular  carcinoma ([place])   ? Stage IV  ? Hyperlipidemia   ? Hypertension   ? Testosterone deficiency 2009  ? ? ?Past Surgical History:  ?Procedure Laterality Date  ? BALLOON DILATION  07/30/2020  ? Procedure: BALLOON DILATION;  Surgeon: Eloise Harman, DO;  [facility];  Service: Endoscopy;;  ? COLONOSCOPY N/A 03/12/2017  ? Procedure: COLONOSCOPY;  Surgeon: Danie Binder, MD;  [facility];  Service: Endoscopy;  Laterality: N/A;  2:00  ? COLONOSCOPY WITH PROPOFOL N/A 06/18/2020  ? Procedure: COLONOSCOPY WITH PROPOFOL;  Surgeon: Eloise Harman, DO;  [facility];  Service: Endoscopy;  Laterality: N/A;  pm appt  ? CORONARY STENT INTERVENTION N/A 12/03/2020  ? Procedure: CORONARY STENT INTERVENTION;  Surgeon: Jettie Booze, MD;  Location: [place] [facility];  Service: Cardiovascular;  Laterality: N/A;  ? ESOPHAGOGASTRODUODENOSCOPY (EGD) WITH PROPOFOL N/A 07/30/2020  ? Procedure: ESOPHAGOGASTRODUODENOSCOPY (EGD) WITH PROPOFOL;  Surgeon: Eloise Harman, DO;  [facility];  Service: Endoscopy;  Laterality: N/A;  11:45am  ? LEFT HEART CATH AND CORONARY ANGIOGRAPHY N/A 12/03/2020  ? Procedure: LEFT HEART CATH AND CORONARY ANGIOGRAPHY;  Surgeon: Jettie Booze, MD;  Location: [place] [facility];  Service: Cardiovascular;  Laterality: N/A;  ? LEFT HEART CATHETERIZATION WITH CORONARY ANGIOGRAM N/A 02/01/2013  ? Procedure: LEFT HEART CATHETERIZATION WITH CORONARY ANGIOGRAM;  Surgeon: Josue Hector, MD;  Location: [facility];  Service: Cardiovascular;  Laterality: N/A;  ? NECK SURGERY    ? POLYPECTOMY  06/18/2020  ? Procedure: POLYPECTOMY;  Surgeon: Eloise Harman, DO;  [facility];  Service: Endoscopy;;  snare and biospy  ? ? ?Family History  ?Problem Relation Age of Onset  ? Heart attack Father   ?     28's  ? ? ?Social History  ? ?Tobacco Use  ? Smoking status: Never  ? Smokeless tobacco: Never  ?Vaping Use  ? Vaping Use: Never used  ?Substance Use Topics  ?  Alcohol use: Not Currently  ?  Comment: 4-6 beers a day  ? Drug use: No  ? ? ?Medications: I have reviewed the patient's current medications. ? ?Allergies  ?Allergen Reactions  ? Cymbalta [Duloxetine Hcl]   ?  Bad dreams  ? Lodine [Etodolac] Nausea And Vomiting  ? Zoloft [Sertraline Hcl]   ?  REDUCED URINARY FLOW,NOCTURIA  ? ? ? ?ROS:  ?Constitutional: negative for chills, fatigue, and fevers ?Respiratory: negative for cough and shortness of breath ?Cardiovascular: negative for chest pain and palpitations ?Gastrointestinal: negative for abdominal pain, nausea, and vomiting ?Musculoskeletal:negative for back pain ? ?Blood pressure 137/82, pulse 83, temperature 98.4 ?F (36.9 ?C), resp. rate 20, height '5\' 8"'$  (1.727 m), weight 81.6 kg, SpO2 98 %. ?Physical Exam ?Vitals reviewed.  ?Constitutional:   ?   General: He is not in acute distress. ?   Appearance: He is well-developed. He is not ill-appearing or toxic-appearing.  ?HENT:  ?   Head: Normocephalic and atraumatic.  ?   Nose:  ?   Comments: NG tube in place ?Eyes:  ?   Extraocular Movements: Extraocular movements intact.  ?   Pupils: Pupils are equal, round, and reactive to light.  ?Cardiovascular:  ?   Rate and Rhythm: Normal rate.  ?Pulmonary:  ?   Effort: Pulmonary effort is normal.  ?Abdominal:  ?   Comments: Abdomen soft, mild distention, no percussion tenderness, nontender to palpation; no rigidity, guarding, rebound tenderness; multiple laparoscopic cicatrix noted on abdomen  ?Skin: ?   General: Skin is warm and dry.  ?Neurological:  ?   General: No focal deficit present.  ?   Mental Status: He is alert and oriented to person, place, and time.  ?Psychiatric:     ?   Mood and Affect: Mood normal.     ?   Behavior: Behavior normal.  ? ? ?Results: ?Results for orders placed or performed during the hospital encounter of 06/11/21 (from the past 48 hour(s))  ?Urinalysis, Routine w reflex microscopic Urine, Clean Catch     Status: Abnormal  ? Collection Time:  06/11/21  2:48 PM  ?Result Value Ref Range  ? Color, Urine YELLOW YELLOW  ? APPearance CLEAR CLEAR  ? Specific Gravity, Urine >1.046 (H) 1.005 - 1.030  ? pH 7.0 5.0 - 8.0  ? Glucose, UA NEGATIVE NEGATIVE mg/dL  ? Hgb urine dipstick NEGATIVE NEGATIVE  ? Bilirubin Urine NEGATIVE NEGATIVE  ? Ketones, ur NEGATIVE NEGATIVE mg/dL  ? Protein, ur 100 (A) NEGATIVE mg/dL  ? Nitrite NEGATIVE NEGATIVE  ? Leukocytes,Ua NEGATIVE NEGATIVE  ? RBC / HPF 0-5 0 - 5 RBC/hpf  ? WBC, UA 0-5 0 - 5 WBC/hpf  ? Bacteria, UA NONE SEEN NONE SEEN  ? Mucus PRESENT   ?  Comment: Performed at [facility], [address], [place], [place] [address]  ?Lipase, blood     Status: None  ? Collection Time: 06/11/21  3:46 PM  ?Result Value Ref Range  ? Lipase 26 11 - 51 U/L  ?  Comment: Performed at [facility], [address] [place], [place] [address]  ?  Comprehensive metabolic panel     Status: Abnormal  ? Collection Time: 06/11/21  3:46 PM  ?Result Value Ref Range  ? Sodium 133 (L) 135 - 145 mmol/L  ? Potassium 4.9 3.5 - 5.1 mmol/L  ? Chloride 92 (L) 98 - 111 mmol/L  ? CO2 26 22 - 32 mmol/L  ? Glucose, Bld 172 (H) 70 - 99 mg/dL  ?  Comment: Glucose reference range applies only to samples taken after fasting for at least 8 hours.  ? BUN 7 (L) 8 - 23 mg/dL  ? Creatinine, Ser 0.63 0.61 - 1.24 mg/dL  ? Calcium 10.0 8.9 - 10.3 mg/dL  ? Total Protein 8.1 6.5 - 8.1 g/dL  ? Albumin 4.1 3.5 - 5.0 g/dL  ? AST 34 15 - 41 U/L  ? ALT 43 0 - 44 U/L  ? Alkaline Phosphatase 87 38 - 126 U/L  ? Total Bilirubin 2.0 (H) 0.3 - 1.2 mg/dL  ? GFR, Estimated >60 >60 mL/min  ?  Comment: (NOTE) ?Calculated using the CKD-EPI Creatinine Equation (2021) ?  ? Anion gap 15 5 - 15  ?  Comment: Performed at [facility], [address] [place], [place] [address]  ?CBC with Differential     Status: Abnormal  ? Collection Time: 06/11/21  4:04 PM  ?Result Value Ref Range  ? WBC 15.0 (H) 4.0 - 10.5 K/uL  ? RBC 5.70 4.22 - 5.81 MIL/uL  ? Hemoglobin 17.9 (H) 13.0 - 17.0 g/dL  ? HCT 51.8  39.0 - 52.0 %  ? MCV 90.9 80.0 - 100.0 fL  ? MCH 31.4 26.0 - 34.0 pg  ? MCHC 34.6 30.0 - 36.0 g/dL  ? RDW 14.8 11.5 - 15.5 %  ? Platelets 312 150 - 400 K/uL  ? nRBC 0.0 0.0 - 0.2 %  ? Neutrophils Relative % 85 %  ? N

## 2021-06-12 NOTE — Hospital Course (Signed)
66 y.o. male,  with medical history significant for stage IV hepatocellular carcinoma, coronary artery disease, hyperlipidemia, hypertension, testosterone deficiency who who presents due to complaints of abdominal pain, nausea and vomiting, reports abdominal pain over the last 24 hours, now just over last few days, he reports he did develop vomiting, where he had 7 episodes of vomiting so far, he denies any coffee-ground emesis, any melena, or bright red blood per rectum, no diarrhea, reports he is passing gas, he had BM today, patient currently on chemotherapy for his epidural carcinoma with carcinomatosis, managed by Dr. Delton Coombes, last dose 5/2 ?-In ED work-up significant for leukocytosis at 15K, sodium low at 133, CT abdomen pelvis significant for small bowel obstruction related to carcinomatosis, ED discussed with general surgery who recommended NG tube insertion on ILS, and hospitalist to admit for further management. ?

## 2021-06-12 NOTE — Consult Note (Signed)
? ?Palliative Care Consult Note  ?                                ?Date: 06/12/2021  ? ?Patient Name: Logan Alvarez  ?DOB: 23-Jan-1956  MRN: [id number]  Age / Sex: 66 y.o., male  ?PCP: Kathyrn Drown, MD ?Referring Physician: Murlean Iba, MD ? ?Reason for Consultation: Establishing goals of care ? ?HPI/Patient Profile: 66 y.o. male  with past medical history of stage IV hepatocellular carcinoma, coronary artery disease, hyperlipidemia, hypertension, testosterone deficiency who who presents due to complaints of abdominal pain, nausea and vomiting, reports abdominal pain over the last 24 hours, now just over last few days, he reports he did develop vomiting, where he had 7 episodes of vomiting so far, he denies any coffee-ground emesis, any melena, or bright red blood per rectum, no diarrhea, reports he is passing gas, he had BM today, patient currently on chemotherapy for his epidural carcinoma with carcinomatosis, managed by Dr. Delton Coombes, last dose 5/2. In the ED workup found leucocytosis, bowel obstruction (CT). Surgery recommended conservative measures including NGT. ? ?PMT was consulted for [place] conversation. ? ?Past Medical History:  ?Diagnosis Date  ? CAD (coronary artery disease)   ? a. 01/2013: cath showing nonobstructive disease. b. 11/2020: NSTEMI with DES to mid-LCx and medical management recommended of distal LCx disease given small vessel size  ? Hepatocellular carcinoma ([place])   ? Stage IV  ? Hyperlipidemia   ? Hypertension   ? Testosterone deficiency 2009  ? ? ?Subjective:  ? ?This NP [place] reviewed medical records, received report from team, assessed the patient and then meet at the patient's bedside to discuss diagnosis, prognosis, GOC, EOL wishes disposition and options. ? ?I met with the patient and his daughter Vito Backers at the bedside. ?  ?Concept of Palliative Care was introduced as specialized medical care for people and their families  living with serious illness.  If focuses on providing relief from the symptoms and stress of a serious illness.  The goal is to improve quality of life for both the patient and the family. Values and goals of care important to patient and family were attempted to be elicited. ? ?Created space and opportunity for patient  and family to explore thoughts and feelings regarding current medical situation ?  ?Natural trajectory and current clinical status were discussed. Questions and concerns addressed. Patient  encouraged to call with questions or concerns.   ? ?Patient/Family Understanding of Illness: ?When asked about their understanding of his current situation, they both agree that they know this is "the beginning of the end".  He describes that he has liver cancer and it is spread.  Admitted consistent alcohol use, cirrhosis.  We discussed hepatocellular carcinoma and increased risk of HCC with liver damage. ? ?We further discussed his current acute and chronic situation including [place] with spread to peritoneal carcinomatosis.  We discussed bowel obstruction, high risk for recurrent bowel obstruction even if this SBO resolves given the peritoneal carcinomatosis and our inability to "fix the underlying cause of his obstruction".  We discussed that he has been getting chemotherapy but he has apparently had spread and progression of his disease regardless. ? ?Life Review: ?The patient has a daughter Wyatt Mage, 2 sons Lovena Le and Juanda Crumble. ? ?Patient Values: ?Family, faith, quality of life ? ?Goals: ?Quality over quantity, does not want to suffer, would prefer to be at home. ? ?Today's  Discussion: ?In addition to the above discussed life review and exploration of his clinical situation we had further extensive discussion on various topics.  We further explored his desire for quality over quantity.  I discussed that in his current situation it appears he is approaching end-of-life.  I explained that I understand that many  patients do not want to make significant life decisions without input to their oncologist.  However, his oncologist is currently out of town and should return next week. ? ?We had discussion about hospice care and philosophy of hospice and comfort care including shifting our focus from "fixing the unfixable" to focus on quality, comfort, dignity at the end of life.  We discussed that given his current bowel obstruction it is very appropriate to allow time for outcomes to see if his obstruction will resolve.  Depending on how this plays out will help limit self to further decisions.  At this point his daughter became a bit emotional and asked for time to step out of the room. ? ?After she left I began discussions on [place] given that he is a full code.  I explained that resuscitation in his current situation multiple acute and chronic illnesses is not likely to have a good outcome.  Additionally, it is can cause suffering.  Finally, even if he does survive resuscitation it would not fix his current cancer situation and he would still be approaching end-of-life.  He states he would like to talk about this more with his family.  I explained that there is no specific timeframe needed for decisions.  He seemed to take solace in this.  I informed him that I would be back on Monday and we can have further discussions depending on how he progresses over the weekend. ? ?When asked he accepted consult for spiritual care. ? ?I provided emotional general support therapeutic listening, empathy, sharing of stories, therapeutic touch, and other techniques.  Answered all questions and addressed all concerns to the best of my ability. ? ?Review of Systems  ?Constitutional:  Positive for fatigue.  ?Gastrointestinal:  Positive for abdominal pain (Improved). Negative for nausea (None since NG tube placement) and vomiting (None since NG tube placement).  ? ?Objective:  ? ?Primary Diagnoses: ?Present on Admission: ? SBO (small bowel  obstruction) ([place]) ? Mixed hyperlipidemia ? Anxiety ? Hepatocellular carcinoma ([place]) ? Coronary artery disease of native heart with stable angina pectoris, unspecified vessel or lesion type ([place]) ? Portal hypertension ([facility]) ? Alcohol dependence ([place]) ? ? ?Physical Exam ?Vitals and nursing note reviewed.  ?Constitutional:   ?   General: He is not in acute distress. ?   Appearance: He is ill-appearing.  ?HENT:  ?   Head: Normocephalic and atraumatic.  ?Cardiovascular:  ?   Rate and Rhythm: Normal rate.  ?Pulmonary:  ?   Effort: Pulmonary effort is normal. No respiratory distress.  ?Abdominal:  ?   General: Abdomen is flat.  ?   Palpations: Abdomen is soft.  ?Skin: ?   General: Skin is warm and dry.  ?Neurological:  ?   General: No focal deficit present.  ?   Mental Status: He is alert.  ?Psychiatric:     ?   Mood and Affect: Mood normal.     ?   Behavior: Behavior normal.  ? ? ?Vital Signs:  ?BP 137/82   Pulse 83   Temp 98.4 ?F (36.9 ?C)   Resp 20   Ht $R'5\' 8"'ae$  (1.727 m)   Wt 81.6  kg   SpO2 98%   BMI 27.37 kg/m?  ? ?Palliative Assessment/Data: 10% (NGT in place, SBO) ? ? ? ?Advanced Care Planning:  ? ?Primary Decision Maker: ?PATIENT ? ?Code Status/Advance Care Planning: ?Full code ? ?A discussion was had today regarding advanced directives. Concepts specific to code status, artifical feeding and hydration, continued IV antibiotics and rehospitalization was had.  The difference between a aggressive medical intervention path and a palliative comfort care path for this patient at this time was had.  ? ?Decisions/Changes to ACP: ?None today ? ?Assessment & Plan:  ? ?Impression: ?Acute and chronically ill 66 year old male with hepatocellular carcinoma and spread now with peritoneal carcinomatosis. Unfortunately this was resulted in a small bowel obstruction.  He is undergoing conservative management with NG tube, n.p.o., and medical treatment.  Not a good surgical candidate at this point. We have had a discussion  about allowing time for outcomes, possible pathways from here including continued aggressive treatment with oncology versus more of a comfort focus and possible enrollment in hospice services.  Overall prognosis

## 2021-06-12 NOTE — Progress Notes (Signed)
?PROGRESS NOTE ? ? ?Logan Alvarez  GYK:[id number] DOB: 10/28/1955 DOA: 06/11/2021 ?PCP: Kathyrn Drown, MD  ? ?Chief Complaint  ?Patient presents with  ? Abdominal Pain  ? ?Level of care: Med-Surg ? ?Brief Admission History:  ?66 y.o. male,  with medical history significant for stage IV hepatocellular carcinoma, coronary artery disease, hyperlipidemia, hypertension, testosterone deficiency who who presents due to complaints of abdominal pain, nausea and vomiting, reports abdominal pain over the last 24 hours, now just over last few days, he reports he did develop vomiting, where he had 7 episodes of vomiting so far, he denies any coffee-ground emesis, any melena, or bright red blood per rectum, no diarrhea, reports he is passing gas, he had BM today, patient currently on chemotherapy for his epidural carcinoma with carcinomatosis, managed by Dr. Delton Coombes, last dose 5/2 ?-In ED work-up significant for leukocytosis at 15K, sodium low at 133, CT abdomen pelvis significant for small bowel obstruction related to carcinomatosis, ED discussed with general surgery who recommended NG tube insertion on ILS, and hospitalist to admit for further management. ?  ?Assessment and Plan: ?* SBO (small bowel obstruction) ([place]) ?-- secondary to carcinomatosis ?-- NG tube in place with bilious output ?-- continue NPO status ?-- continue IV pain and nausea medications ?-- appreciate surgery consultation ? ?Alcohol dependence ([place]) ?-- reports daily beer consumption, he is on CIWA protocol  ? ?Portal hypertension ([facility]) ?-- continue beta blocker therapy  ? ?Coronary artery disease of native heart with stable angina pectoris, unspecified vessel or lesion type ([place]) ?-- temporarily holding aspirin / plavix while NPO with NG tube in place to suction ? ?Hepatocellular carcinoma ([place]) ?-- followed by Dr. Raliegh Ip and on chemotherapy ?-- now with carcinomatosis  ?-- palliative medicine consultation requested  ?-- will notify Dr. Raliegh Ip of admission   ? ?Anxiety ?-- he is on lorazepam  ? ?Mixed hyperlipidemia ?-- has been diet controlled recently, he is NPO now ? ?DVT prophylaxis: enoxaparin ?Code Status: Full  ?Family Communication:  ?Disposition: Status is: Inpatient ?Remains inpatient appropriate because: IV fluids required  ?  ?Consultants:  ?Surgery  ?Procedures:  ? ?Antimicrobials:  ?  ?Subjective: ?Pt reports some better after NG tube placed but wants to know how long he will have it.   ?Objective: ?Vitals:  ? 06/11/21 2042 06/12/21 0224 06/12/21 0455 06/12/21 1057  ?BP: (!) 188/97 (!) 149/93 (!) 172/97 137/82  ?Pulse: 84 91 92 83  ?Resp: '18 18 18 20  '$ ?Temp: 98.1 ?F (36.7 ?C) 97.8 ?F (36.6 ?C) 98.4 ?F (36.9 ?C)   ?TempSrc:      ?SpO2: 99% 95% 94% 98%  ?Weight:      ?Height:      ? ? ?Intake/Output Summary (Last 24 hours) at 06/12/2021 1325 ?Last data filed at 06/12/2021 0300 ?Gross per 24 hour  ?Intake 52 ml  ?Output 900 ml  ?Net -848 ml  ? ?Filed Weights  ? 06/11/21 1446  ?Weight: 81.6 kg  ? ?Examination: ? ?General exam: NG tube in place.  Appears calm and NAD.  ?Respiratory system: Clear to auscultation. Respiratory effort normal. ?Cardiovascular system: normal S1 & S2 heard. No JVD, murmurs, rubs, gallops or clicks. No pedal edema. ?Gastrointestinal system: Abdomen is nondistended, soft and diffusely tender to light palpation. No organomegaly or masses felt. Normal bowel sounds heard. ?Central nervous system: Alert and oriented. No focal neurological deficits. ?Extremities: Symmetric 5 x 5 power. ?Skin: No rashes, lesions or ulcers. ?Psychiatry: Judgement and insight appear normal. Mood & affect  appropriate.  ? ?Data Reviewed: I have personally reviewed following labs and imaging studies ? ?CBC: ?Recent Labs  ?Lab 06/11/21 ?1604 06/12/21 ?0416  ?WBC 15.0* 12.6*  ?NEUTROABS 12.8*  --   ?HGB 17.9* 17.0  ?HCT 51.8 48.9  ?MCV 90.9 91.4  ?PLT 312 274  ? ? ?Basic Metabolic Panel: ?Recent Labs  ?Lab 06/11/21 ?1546 06/12/21 ?0416  ?NA 133* 135  ?K 4.9 4.2  ?CL  92* 98  ?CO2 26 28  ?GLUCOSE 172* 166*  ?BUN 7* 8  ?CREATININE 0.63 0.56*  ?CALCIUM 10.0 9.6  ? ? ?CBG: ?No results for input(s): GLUCAP in the last 168 hours. ? ?No results found for this or any previous visit (from the past 240 hour(s)).  ? ?Radiology Studies: ?DG Abdomen 1 View ? ?Result Date: 06/11/2021 ?CLINICAL DATA:  Nasogastric tube placement. EXAM: ABDOMEN - 1 VIEW COMPARISON:  None Available. FINDINGS: The bowel gas pattern is normal. Distal tip of nasogastric tube is seen in proximal stomach just beyond gastroesophageal junction. No radio-opaque calculi or other significant radiographic abnormality are seen. IMPRESSION: Distal tip of nasogastric tube seen in proximal stomach just beyond expected position of gastroesophageal junction. Advancement is recommended. Electronically Signed   By: Marijo Conception M.D.   On: 06/11/2021 20:49  ? ?CT Abdomen Pelvis W Contrast ? ?Result Date: 06/11/2021 ?CLINICAL DATA:  Abdominal pain. Bowel obstruction suspected. History of hepatocellular carcinoma with peritoneal carcinomatosis. History of partial hepatectomy. EXAM: CT ABDOMEN AND PELVIS WITH CONTRAST TECHNIQUE: Multidetector CT imaging of the abdomen and pelvis was performed using the standard protocol following bolus administration of intravenous contrast. RADIATION DOSE REDUCTION: This exam was performed according to the departmental dose-optimization program which includes automated exposure control, adjustment of the mA and/or kV according to patient size and/or use of iterative reconstruction technique. CONTRAST:  130m OMNIPAQUE IOHEXOL 300 MG/ML  SOLN COMPARISON:  CT chest abdomen pelvis 05/22/2021 FINDINGS: Lower chest: Lung bases are clear.  No pleural effusions. Hepatobiliary: Subcentimeter hypodensities in the liver are again noted. History of partial hepatectomy involving segments 5 and 6. Main portal venous system is patent. No significant biliary dilatation. Gallbladder appears to be surgically absent.  Liver has a slightly nodular contour and compatible with underlying cirrhosis. Pancreas: Unremarkable. No pancreatic ductal dilatation or surrounding inflammatory changes. Spleen: Normal in size without focal abnormality. Adrenals/Urinary Tract: Normal adrenal glands. Again noted is a low-density structure in the right kidney lower pole that is suggestive for a cyst and does not require dedicated follow-up. No suspicious renal lesions. Negative for hydronephrosis. Mild distention of the urinary bladder. Stomach/Bowel: Small hiatal hernia. Stomach is mildly distended with fluid. Multiple fluid-filled loops of small bowel abdomen measuring up to 3.5 cm in diameter. Distal small bowel is decompressed. Findings are suggestive for a small bowel obstruction. Transition point is likely in the right abdomen near sequence 2 image 54. In addition, there may be distended or fluid-filled appendix which is similar to the prior examination. Vascular/Lymphatic: Atherosclerotic calcifications in the aorta and iliac arteries without aneurysm. Main visceral arteries are patent. No significant lymph node enlargement in the abdomen or pelvis. Reproductive: Prostate is unremarkable. Other: Again noted is abnormal soft tissue involving the anterior peritoneum and omentum. There is also soft tissue thickening or fullness along the right abdominal mesentery which is probably near the small bowel transition point. Findings are compatible with peritoneal carcinomatosis. Small amount of ascites in the abdomen which is new. Negative for free air. Musculoskeletal: Stable sclerosis involving the left  iliac wing. No acute bone abnormality. Stable compression deformity involving the L1 vertebral body. No acute bone abnormality. IMPRESSION: 1. Dilated loops of small bowel are most compatible with a small bowel obstruction. Distal small bowel is decompressed and transition point is not clearly identified but probably in the right abdomen associated  with mesenteric thickening and peritoneal disease. 2. Again noted is abnormal peritoneal and mesenteric soft tissue that is compatible with peritoneal carcinomatosis. Small amount of ascites is new. 3.  Aort

## 2021-06-12 NOTE — Assessment & Plan Note (Addendum)
--   pt assessed by Education officer, museum and reported he is no longer drinking alcohol so will d/c CIWA  ?

## 2021-06-12 NOTE — Assessment & Plan Note (Addendum)
--   followed by Dr. Raliegh Ip and on chemotherapy ?-- now with carcinomatosis  ?-- palliative medicine consultation appreciated  ?-- notified Dr. Raliegh Ip of admission  ?-- follow up with Dr. Raliegh Ip for ongoing management  ?

## 2021-06-13 ENCOUNTER — Inpatient Hospital Stay (HOSPITAL_COMMUNITY): Payer: Medicare Other

## 2021-06-13 DIAGNOSIS — I25118 Atherosclerotic heart disease of native coronary artery with other forms of angina pectoris: Secondary | ICD-10-CM | POA: Diagnosis not present

## 2021-06-13 DIAGNOSIS — C220 Liver cell carcinoma: Secondary | ICD-10-CM | POA: Diagnosis not present

## 2021-06-13 DIAGNOSIS — K56609 Unspecified intestinal obstruction, unspecified as to partial versus complete obstruction: Secondary | ICD-10-CM | POA: Diagnosis not present

## 2021-06-13 DIAGNOSIS — C786 Secondary malignant neoplasm of retroperitoneum and peritoneum: Secondary | ICD-10-CM

## 2021-06-13 DIAGNOSIS — E782 Mixed hyperlipidemia: Secondary | ICD-10-CM | POA: Diagnosis not present

## 2021-06-13 LAB — COMPREHENSIVE METABOLIC PANEL
ALT: 25 U/L (ref 0–44)
AST: 20 U/L (ref 15–41)
Albumin: 2.9 g/dL — ABNORMAL LOW (ref 3.5–5.0)
Alkaline Phosphatase: 58 U/L (ref 38–126)
Anion gap: 5 (ref 5–15)
BUN: 13 mg/dL (ref 8–23)
CO2: 30 mmol/L (ref 22–32)
Calcium: 9.2 mg/dL (ref 8.9–10.3)
Chloride: 101 mmol/L (ref 98–111)
Creatinine, Ser: 0.58 mg/dL — ABNORMAL LOW (ref 0.61–1.24)
GFR, Estimated: >60 mL/min (ref >60)
Glucose, Bld: 96 mg/dL (ref 70–99)
Potassium: 4 mmol/L (ref 3.5–5.1)
Sodium: 136 mmol/L (ref 135–145)
Total Bilirubin: 1.7 mg/dL — ABNORMAL HIGH (ref 0.3–1.2)
Total Protein: 5.9 g/dL — ABNORMAL LOW (ref 6.5–8.1)

## 2021-06-13 LAB — CBC WITH DIFFERENTIAL/PLATELET
Abs Immature Granulocytes: 0.1 10*3/uL — ABNORMAL HIGH (ref 0.00–0.07)
Basophils Absolute: 0 10*3/uL (ref 0.0–0.1)
Basophils Relative: 0 %
Eosinophils Absolute: 0 10*3/uL (ref 0.0–0.5)
Eosinophils Relative: 0 %
HCT: 41.1 % (ref 39.0–52.0)
Hemoglobin: 13.9 g/dL (ref 13.0–17.0)
Immature Granulocytes: 1 %
Lymphocytes Relative: 11 %
Lymphs Abs: 1.7 10*3/uL (ref 0.7–4.0)
MCH: 32.1 pg (ref 26.0–34.0)
MCHC: 33.8 g/dL (ref 30.0–36.0)
MCV: 94.9 fL (ref 80.0–100.0)
Monocytes Absolute: 1.8 10*3/uL — ABNORMAL HIGH (ref 0.1–1.0)
Monocytes Relative: 12 %
Neutro Abs: 11.2 10*3/uL — ABNORMAL HIGH (ref 1.7–7.7)
Neutrophils Relative %: 76 %
Platelets: 232 10*3/uL (ref 150–400)
RBC: 4.33 MIL/uL (ref 4.22–5.81)
RDW: 14.8 % (ref 11.5–15.5)
WBC: 14.9 10*3/uL — ABNORMAL HIGH (ref 4.0–10.5)
nRBC: 0 % (ref 0.0–0.2)

## 2021-06-13 LAB — MAGNESIUM: Magnesium: 2.1 mg/dL (ref 1.7–2.4)

## 2021-06-13 MED ORDER — BISACODYL 10 MG RE SUPP
10.0000 mg | Freq: Every day | RECTAL | Status: DC
Start: 2021-06-13 — End: 2021-06-15
  Administered 2021-06-13 – 2021-06-15 (×2): 10 mg via RECTAL
  Filled 2021-06-13 (×3): qty 1

## 2021-06-13 NOTE — Progress Notes (Signed)
[facility] Progress Note ? ?   ?Subjective: ?Patient seen and examined.  He is resting comfortably in bed.  He has passed flatus numerous times without any bowel movements.  He denies abdominal pain, nausea, and vomiting.  He denies fevers and chills. ? ?Objective: ?Vital signs in last 24 hours: ?Temp:  [97.8 ?F (36.6 ?C)-97.9 ?F (36.6 ?C)] 97.9 ?F (36.6 ?C) (05/12 0343) ?Pulse Rate:  [63-83] 63 (05/12 0343) ?Resp:  [18-20] 18 (05/12 0343) ?BP: (137-157)/(82-96) 154/92 (05/12 0343) ?SpO2:  [97 %-99 %] 98 % (05/12 0343) ?Last BM Date : 06/12/21 ? ?Intake/Output from previous day: ?05/11 0701 - 05/12 0700 ?In: -  ?Out: 800 [Emesis/NG output:800] ?Intake/Output this shift: ?No intake/output data recorded. ? ?General appearance: alert, cooperative, and no distress ?Nose: NG tube in place ?GI: Abdomen soft, mild distention, no percussion tenderness, nontender to palpation; no rigidity, guarding, or rebound tenderness ? ?Lab Results:  ?Recent Labs  ?  06/12/21 ?0416 06/13/21 ?3295  ?WBC 12.6* 14.9*  ?HGB 17.0 13.9  ?HCT 48.9 41.1  ?PLT 274 232  ? ?BMET ?Recent Labs  ?  06/12/21 ?0416 06/13/21 ?1884  ?NA 135 136  ?K 4.2 4.0  ?CL 98 101  ?CO2 28 30  ?GLUCOSE 166* 96  ?BUN 8 13  ?CREATININE 0.56* 0.58*  ?CALCIUM 9.6 9.2  ? ?PT/INR ?No results for input(s): LABPROT, INR in the last 72 hours. ? ?Studies/Results: ?DG Abdomen 1 View ? ?Result Date: 06/11/2021 ?CLINICAL DATA:  Nasogastric tube placement. EXAM: ABDOMEN - 1 VIEW COMPARISON:  None Available. FINDINGS: The bowel gas pattern is normal. Distal tip of nasogastric tube is seen in proximal stomach just beyond gastroesophageal junction. No radio-opaque calculi or other significant radiographic abnormality are seen. IMPRESSION: Distal tip of nasogastric tube seen in proximal stomach just beyond expected position of gastroesophageal junction. Advancement is recommended. Electronically Signed   By: Marijo Conception M.D.   On: 06/11/2021 20:49  ? ?CT Abdomen  Pelvis W Contrast ? ?Result Date: 06/11/2021 ?CLINICAL DATA:  Abdominal pain. Bowel obstruction suspected. History of hepatocellular carcinoma with peritoneal carcinomatosis. History of partial hepatectomy. EXAM: CT ABDOMEN AND PELVIS WITH CONTRAST TECHNIQUE: Multidetector CT imaging of the abdomen and pelvis was performed using the standard protocol following bolus administration of intravenous contrast. RADIATION DOSE REDUCTION: This exam was performed according to the departmental dose-optimization program which includes automated exposure control, adjustment of the mA and/or kV according to patient size and/or use of iterative reconstruction technique. CONTRAST:  1101m OMNIPAQUE IOHEXOL 300 MG/ML  SOLN COMPARISON:  CT chest abdomen pelvis 05/22/2021 FINDINGS: Lower chest: Lung bases are clear.  No pleural effusions. Hepatobiliary: Subcentimeter hypodensities in the liver are again noted. History of partial hepatectomy involving segments 5 and 6. Main portal venous system is patent. No significant biliary dilatation. Gallbladder appears to be surgically absent. Liver has a slightly nodular contour and compatible with underlying cirrhosis. Pancreas: Unremarkable. No pancreatic ductal dilatation or surrounding inflammatory changes. Spleen: Normal in size without focal abnormality. Adrenals/Urinary Tract: Normal adrenal glands. Again noted is a low-density structure in the right kidney lower pole that is suggestive for a cyst and does not require dedicated follow-up. No suspicious renal lesions. Negative for hydronephrosis. Mild distention of the urinary bladder. Stomach/Bowel: Small hiatal hernia. Stomach is mildly distended with fluid. Multiple fluid-filled loops of small bowel abdomen measuring up to 3.5 cm in diameter. Distal small bowel is decompressed. Findings are suggestive for a small bowel obstruction. Transition point is likely in the  right abdomen near sequence 2 image 54. In addition, there may be  distended or fluid-filled appendix which is similar to the prior examination. Vascular/Lymphatic: Atherosclerotic calcifications in the aorta and iliac arteries without aneurysm. Main visceral arteries are patent. No significant lymph node enlargement in the abdomen or pelvis. Reproductive: Prostate is unremarkable. Other: Again noted is abnormal soft tissue involving the anterior peritoneum and omentum. There is also soft tissue thickening or fullness along the right abdominal mesentery which is probably near the small bowel transition point. Findings are compatible with peritoneal carcinomatosis. Small amount of ascites in the abdomen which is new. Negative for free air. Musculoskeletal: Stable sclerosis involving the left iliac wing. No acute bone abnormality. Stable compression deformity involving the L1 vertebral body. No acute bone abnormality. IMPRESSION: 1. Dilated loops of small bowel are most compatible with a small bowel obstruction. Distal small bowel is decompressed and transition point is not clearly identified but probably in the right abdomen associated with mesenteric thickening and peritoneal disease. 2. Again noted is abnormal peritoneal and mesenteric soft tissue that is compatible with peritoneal carcinomatosis. Small amount of ascites is new. 3.  Aortic Atherosclerosis (ICD10-I70.0). 4. Cirrhosis. Electronically Signed   By: Markus Daft M.D.   On: 06/11/2021 18:04   ? ?Anti-infectives: ?Anti-infectives (From admission, onward)  ? ? None  ? ?  ? ? ?Assessment/Plan: ? ?Patient is a 65 year old male who was admitted with small bowel obstruction.  CT abdomen pelvis demonstrates dilated loops of small bowel, compatible with small bowel obstruction, no transition point clearly identified but probably in the right abdomen associated with mesenteric thickening from peritoneal carcinomatosis. ? ?-Patient with persistent leukocytosis, 14.9 today from 12.6 yesterday ?-Will obtain KUB today ?-Given that  patient is passing flatus, will likely clamp NG tube and trial clear liquids pending KUB ?-Dulcolax suppositories ordered ?-I explained to the patient and his family that since he is passing flatus, he likely has a partial bowel obstruction.  We can attempt to advance his diet and see how he progresses, but if he develops nausea and vomiting, we may have to replace NG tube if it has been removed. ?-They are understanding that even if this bowel obstruction resolves, that he is at significant risk for another bowel obstruction in the future ?-Continue to monitor for bowel function ?-Appreciate palliative care and hospitalist recommendations ? ? LOS: 2 days  ? ? ?Obadiah Dennard A Elpidia Karn ?06/13/2021 ? ?

## 2021-06-13 NOTE — Progress Notes (Signed)
?PROGRESS NOTE ? ? ?Logan Alvarez  UXL:[id number] DOB: 1955-11-11 DOA: 06/11/2021 ?PCP: Kathyrn Drown, MD  ? ?Chief Complaint  ?Patient presents with  ? Abdominal Pain  ? ?Level of care: Med-Surg ? ?Brief Admission History:  ?66 y.o. male,  with medical history significant for stage IV hepatocellular carcinoma, coronary artery disease, hyperlipidemia, hypertension, testosterone deficiency who who presents due to complaints of abdominal pain, nausea and vomiting, reports abdominal pain over the last 24 hours, now just over last few days, he reports he did develop vomiting, where he had 7 episodes of vomiting so far, he denies any coffee-ground emesis, any melena, or bright red blood per rectum, no diarrhea, reports he is passing gas, he had BM today, patient currently on chemotherapy for his epidural carcinoma with carcinomatosis, managed by Dr. Delton Coombes, last dose 5/2 ?-In ED work-up significant for leukocytosis at 15K, sodium low at 133, CT abdomen pelvis significant for small bowel obstruction related to carcinomatosis, ED discussed with general surgery who recommended NG tube insertion on ILS, and hospitalist to admit for further management. ?  ?Assessment and Plan: ?* SBO (small bowel obstruction) ([place]) ?-- secondary to carcinomatosis ?-- NG tube in place with bilious output ?-- Pt having flatus today, no BM, diet advanced to clears per surgery  ?-- continue IV pain and nausea medications ?-- appreciate surgery consultation ? ?Alcohol dependence ([place]) ?-- reports daily beer consumption, he is on CIWA protocol  ? ?Portal hypertension (HCC) ?-- continue beta blocker therapy  ? ?Peritoneal carcinomatosis [facility]) ?-- appreciate palliative discussions, they plan to revisit on Monday 5/15 ?-- high risk for recurrence of bowel obstruction, patient and daughter aware ? ?Coronary artery disease of native heart with stable angina pectoris, unspecified vessel or lesion type ([place]) ?-- temporarily holding aspirin / plavix  just in case he needs procedures ? ?Hepatocellular carcinoma ([place]) ?-- followed by Dr. Raliegh Ip and on chemotherapy ?-- now with carcinomatosis  ?-- palliative medicine consultation requested  ?-- will notify Dr. Raliegh Ip of admission  ? ?Anxiety ?-- he is on lorazepam  ? ?Mixed hyperlipidemia ?-- has been diet controlled recently ? ?DVT prophylaxis: enoxaparin ?Code Status: Full  ?Family Communication:  ?Disposition: Status is: Inpatient ?Remains inpatient appropriate because: IV fluids required  ?  ?Consultants:  ?Surgery  ?Procedures:  ? ?Antimicrobials:  ?  ?Subjective: ?Pt reports having flatus but no bowel movement, no severe abdominal pain or cramping.   ?Objective: ?Vitals:  ? 06/12/21 2159 06/13/21 0343 06/13/21 1300 06/13/21 1402  ?BP: (!) 157/96 (!) 154/92 123/79 122/84  ?Pulse: 75 63 83 75  ?Resp: '18 18  20  '$ ?Temp: 97.9 ?F (36.6 ?C) 97.9 ?F (36.6 ?C)  98.1 ?F (36.7 ?C)  ?TempSrc:    Oral  ?SpO2: 99% 98%  97%  ?Weight:      ?Height:      ? ?No intake or output data in the 24 hours ending 06/13/21 1507 ? ?Filed Weights  ? 06/11/21 1446  ?Weight: 81.6 kg  ? ?Examination: ? ?General exam: NG tube in place.  Appears calm and NAD.  ?Respiratory system: Clear to auscultation. Respiratory effort normal. ?Cardiovascular system: normal S1 & S2 heard. No JVD, murmurs, rubs, gallops or clicks. No pedal edema. ?Gastrointestinal system: Abdomen is nondistended, soft and tender to light palpation. No organomegaly or masses felt. Normal bowel sounds heard. ?Central nervous system: Alert and oriented. No focal neurological deficits. ?Extremities: Symmetric 5 x 5 power. ?Skin: No rashes, lesions or ulcers. ?Psychiatry: Judgement and insight appear  normal. Mood & affect appropriate.  ? ?Data Reviewed: I have personally reviewed following labs and imaging studies ? ?CBC: ?Recent Labs  ?Lab 06/11/21 ?1604 06/12/21 ?0416 06/13/21 ?3664  ?WBC 15.0* 12.6* 14.9*  ?NEUTROABS 12.8*  --  11.2*  ?HGB 17.9* 17.0 13.9  ?HCT 51.8 48.9 41.1  ?MCV  90.9 91.4 94.9  ?PLT 312 274 232  ? ? ?Basic Metabolic Panel: ?Recent Labs  ?Lab 06/11/21 ?1546 06/12/21 ?0416 06/13/21 ?4034  ?NA 133* 135 136  ?K 4.9 4.2 4.0  ?CL 92* 98 101  ?CO2 '26 28 30  '$ ?GLUCOSE 172* 166* 96  ?BUN 7* 8 13  ?CREATININE 0.63 0.56* 0.58*  ?CALCIUM 10.0 9.6 9.2  ?MG  --   --  2.1  ? ? ?CBG: ?No results for input(s): GLUCAP in the last 168 hours. ? ?No results found for this or any previous visit (from the past 240 hour(s)).  ? ?Radiology Studies: ?DG Abd 1 View ? ?Result Date: 06/13/2021 ?CLINICAL DATA:  Small-bowel obstruction EXAM: ABDOMEN - 1 VIEW COMPARISON:  Radiograph dated Jun 11, 2021 FINDINGS: Enteric tube tip is positioned near the GE junction. Persistent dilated loops of small bowel. IMPRESSION: 1. Enteric tube tip is positioned near the GE junction, recommend advancement. 2. Persistent air-filled dilated loops of small bowel, compatible with small bowel obstruction. Electronically Signed   By: Yetta Glassman M.D.   On: 06/13/2021 11:00  ? ?DG Abdomen 1 View ? ?Result Date: 06/11/2021 ?CLINICAL DATA:  Nasogastric tube placement. EXAM: ABDOMEN - 1 VIEW COMPARISON:  None Available. FINDINGS: The bowel gas pattern is normal. Distal tip of nasogastric tube is seen in proximal stomach just beyond gastroesophageal junction. No radio-opaque calculi or other significant radiographic abnormality are seen. IMPRESSION: Distal tip of nasogastric tube seen in proximal stomach just beyond expected position of gastroesophageal junction. Advancement is recommended. Electronically Signed   By: Marijo Conception M.D.   On: 06/11/2021 20:49  ? ?CT Abdomen Pelvis W Contrast ? ?Result Date: 06/11/2021 ?CLINICAL DATA:  Abdominal pain. Bowel obstruction suspected. History of hepatocellular carcinoma with peritoneal carcinomatosis. History of partial hepatectomy. EXAM: CT ABDOMEN AND PELVIS WITH CONTRAST TECHNIQUE: Multidetector CT imaging of the abdomen and pelvis was performed using the standard protocol  following bolus administration of intravenous contrast. RADIATION DOSE REDUCTION: This exam was performed according to the departmental dose-optimization program which includes automated exposure control, adjustment of the mA and/or kV according to patient size and/or use of iterative reconstruction technique. CONTRAST:  120m OMNIPAQUE IOHEXOL 300 MG/ML  SOLN COMPARISON:  CT chest abdomen pelvis 05/22/2021 FINDINGS: Lower chest: Lung bases are clear.  No pleural effusions. Hepatobiliary: Subcentimeter hypodensities in the liver are again noted. History of partial hepatectomy involving segments 5 and 6. Main portal venous system is patent. No significant biliary dilatation. Gallbladder appears to be surgically absent. Liver has a slightly nodular contour and compatible with underlying cirrhosis. Pancreas: Unremarkable. No pancreatic ductal dilatation or surrounding inflammatory changes. Spleen: Normal in size without focal abnormality. Adrenals/Urinary Tract: Normal adrenal glands. Again noted is a low-density structure in the right kidney lower pole that is suggestive for a cyst and does not require dedicated follow-up. No suspicious renal lesions. Negative for hydronephrosis. Mild distention of the urinary bladder. Stomach/Bowel: Small hiatal hernia. Stomach is mildly distended with fluid. Multiple fluid-filled loops of small bowel abdomen measuring up to 3.5 cm in diameter. Distal small bowel is decompressed. Findings are suggestive for a small bowel obstruction. Transition point is likely in the  right abdomen near sequence 2 image 54. In addition, there may be distended or fluid-filled appendix which is similar to the prior examination. Vascular/Lymphatic: Atherosclerotic calcifications in the aorta and iliac arteries without aneurysm. Main visceral arteries are patent. No significant lymph node enlargement in the abdomen or pelvis. Reproductive: Prostate is unremarkable. Other: Again noted is abnormal soft  tissue involving the anterior peritoneum and omentum. There is also soft tissue thickening or fullness along the right abdominal mesentery which is probably near the small bowel transition point. Findings are compat

## 2021-06-13 NOTE — Assessment & Plan Note (Addendum)
--   high risk for recurrence of bowel obstruction, patient and daughter aware ?-- follow up with Dr. Delton Coombes  ?

## 2021-06-13 NOTE — Care Management Important Message (Signed)
Important Message ? ?Patient Details  ?Name: Logan Alvarez ?MRN: [id number] ?Date of Birth: 1955/06/27 ? ? ?Medicare Important Message Given:  Yes ? ? ? ? ?[name] ?06/13/2021, 12:19 PM ?

## 2021-06-14 DIAGNOSIS — C220 Liver cell carcinoma: Secondary | ICD-10-CM | POA: Diagnosis not present

## 2021-06-14 DIAGNOSIS — K56609 Unspecified intestinal obstruction, unspecified as to partial versus complete obstruction: Secondary | ICD-10-CM | POA: Diagnosis not present

## 2021-06-14 DIAGNOSIS — E782 Mixed hyperlipidemia: Secondary | ICD-10-CM | POA: Diagnosis not present

## 2021-06-14 DIAGNOSIS — I25118 Atherosclerotic heart disease of native coronary artery with other forms of angina pectoris: Secondary | ICD-10-CM | POA: Diagnosis not present

## 2021-06-14 LAB — COMPREHENSIVE METABOLIC PANEL
ALT: 21 U/L (ref 0–44)
AST: 17 U/L (ref 15–41)
Albumin: 2.9 g/dL — ABNORMAL LOW (ref 3.5–5.0)
Alkaline Phosphatase: 54 U/L (ref 38–126)
Anion gap: 4 — ABNORMAL LOW (ref 5–15)
BUN: 13 mg/dL (ref 8–23)
CO2: 28 mmol/L (ref 22–32)
Calcium: 8.7 mg/dL — ABNORMAL LOW (ref 8.9–10.3)
Chloride: 103 mmol/L (ref 98–111)
Creatinine, Ser: 0.64 mg/dL (ref 0.61–1.24)
GFR, Estimated: >60 mL/min (ref >60)
Glucose, Bld: 99 mg/dL (ref 70–99)
Potassium: 4 mmol/L (ref 3.5–5.1)
Sodium: 135 mmol/L (ref 135–145)
Total Bilirubin: 1.6 mg/dL — ABNORMAL HIGH (ref 0.3–1.2)
Total Protein: 5.6 g/dL — ABNORMAL LOW (ref 6.5–8.1)

## 2021-06-14 LAB — CBC WITH DIFFERENTIAL/PLATELET
Abs Immature Granulocytes: 0.06 10*3/uL (ref 0.00–0.07)
Basophils Absolute: 0 10*3/uL (ref 0.0–0.1)
Basophils Relative: 0 %
Eosinophils Absolute: 0.1 10*3/uL (ref 0.0–0.5)
Eosinophils Relative: 1 %
HCT: 40.1 % (ref 39.0–52.0)
Hemoglobin: 13.8 g/dL (ref 13.0–17.0)
Immature Granulocytes: 1 %
Lymphocytes Relative: 13 %
Lymphs Abs: 1.4 10*3/uL (ref 0.7–4.0)
MCH: 32.4 pg (ref 26.0–34.0)
MCHC: 34.4 g/dL (ref 30.0–36.0)
MCV: 94.1 fL (ref 80.0–100.0)
Monocytes Absolute: 1.2 10*3/uL — ABNORMAL HIGH (ref 0.1–1.0)
Monocytes Relative: 12 %
Neutro Abs: 7.6 10*3/uL (ref 1.7–7.7)
Neutrophils Relative %: 73 %
Platelets: 178 10*3/uL (ref 150–400)
RBC: 4.26 MIL/uL (ref 4.22–5.81)
RDW: 14.6 % (ref 11.5–15.5)
WBC: 10.4 10*3/uL (ref 4.0–10.5)
nRBC: 0 % (ref 0.0–0.2)

## 2021-06-14 MED ORDER — PANTOPRAZOLE SODIUM 40 MG IV SOLR
40.0000 mg | INTRAVENOUS | Status: DC
Start: 2021-06-15 — End: 2021-06-15
  Administered 2021-06-15: 40 mg via INTRAVENOUS
  Filled 2021-06-14: qty 10

## 2021-06-14 MED ORDER — HYDRALAZINE HCL 20 MG/ML IJ SOLN
10.0000 mg | INTRAMUSCULAR | Status: DC | PRN
Start: 2021-06-14 — End: 2021-06-15

## 2021-06-14 MED ORDER — PREDNISONE 10 MG PO TABS
10.0000 mg | ORAL_TABLET | Freq: Every day | ORAL | Status: DC
Start: 2021-06-15 — End: 2021-06-15
  Administered 2021-06-15: 10 mg via ORAL
  Filled 2021-06-14: qty 1

## 2021-06-14 MED ORDER — LOSARTAN POTASSIUM 50 MG PO TABS
25.0000 mg | ORAL_TABLET | Freq: Every day | ORAL | Status: DC
  Administered 2021-06-14 – 2021-06-15 (×2): 25 mg via ORAL
  Filled 2021-06-14 (×2): qty 1

## 2021-06-14 MED ORDER — METOPROLOL TARTRATE 25 MG PO TABS
12.5000 mg | ORAL_TABLET | Freq: Two times a day (BID) | ORAL | Status: DC
  Administered 2021-06-15: 12.5 mg via ORAL
  Filled 2021-06-14: qty 1

## 2021-06-14 MED ORDER — KETOROLAC TROMETHAMINE 15 MG/ML IJ SOLN: 15.0000 mg | Freq: Three times a day (TID) | INTRAMUSCULAR | Status: DC | PRN

## 2021-06-14 MED ORDER — METHYLPREDNISOLONE SODIUM SUCC 40 MG IJ SOLR
20.0000 mg | Freq: Every day | INTRAMUSCULAR | Status: DC
  Administered 2021-06-14: 20 mg via INTRAVENOUS
  Filled 2021-06-14: qty 1

## 2021-06-14 MED ORDER — LOSARTAN POTASSIUM 50 MG PO TABS: 25.0000 mg | ORAL_TABLET | Freq: Every morning | ORAL | Status: DC

## 2021-06-14 MED ORDER — HYDROMORPHONE HCL 1 MG/ML IJ SOLN
0.5000 mg | INTRAMUSCULAR | Status: DC | PRN
  Administered 2021-06-14: 0.5 mg via INTRAVENOUS
  Filled 2021-06-14: qty 0.5

## 2021-06-14 NOTE — Progress Notes (Signed)
?PROGRESS NOTE ? ? ?Logan Alvarez  ZOX:[id number] DOB: [birth year] DOA: 06/11/2021 ?PCP: Kathyrn Drown, MD  ? ?Chief Complaint  ?Patient presents with  ? Abdominal Pain  ? ?Level of care: Med-Surg ? ?Brief Admission History:  ?66 y.o. male,  with medical history significant for stage IV hepatocellular carcinoma, coronary artery disease, hyperlipidemia, hypertension, testosterone deficiency who who presents due to complaints of abdominal pain, nausea and vomiting, reports abdominal pain over the last 24 hours, now just over last few days, he reports he did develop vomiting, where he had 7 episodes of vomiting so far, he denies any coffee-ground emesis, any melena, or bright red blood per rectum, no diarrhea, reports he is passing gas, he had BM today, patient currently on chemotherapy for his epidural carcinoma with carcinomatosis, managed by Dr. Delton Coombes, last dose 5/2 ?-In ED work-up significant for leukocytosis at 15K, sodium low at 133, CT abdomen pelvis significant for small bowel obstruction related to carcinomatosis, ED discussed with general surgery who recommended NG tube insertion on ILS, and hospitalist to admit for further management. ?  ?Assessment and Plan: ?* SBO (small bowel obstruction) ([place]) ?-- secondary to carcinomatosis ?-- NG tube management per surgery team ?-- Pt having flatus and BM, diet advanced per surgery  ?-- continue IV pain and nausea medications ?-- appreciate surgery consultation ? ?Alcohol dependence ([place]) ?-- pt assessed by Education officer, museum and reported he is no longer drinking alcohol so will d/c CIWA  ? ?Portal hypertension ([facility]) ?-- continue beta blocker therapy  ? ?Peritoneal carcinomatosis [facility]) ?-- appreciate palliative discussions, they plan to revisit on Monday 5/15 ?-- high risk for recurrence of bowel obstruction, patient and daughter aware ? ?Coronary artery disease of native heart with stable angina pectoris, unspecified vessel or lesion type ([place]) ?-- temporarily holding  aspirin / plavix just in case he needs procedures ? ?Hepatocellular carcinoma ([place]) ?-- followed by Dr. Raliegh Ip and on chemotherapy ?-- now with carcinomatosis  ?-- palliative medicine consultation appreciated  ?-- notified Dr. Raliegh Ip of admission  ? ?Anxiety ?-- he is on lorazepam  ? ?Mixed hyperlipidemia ?-- has been diet controlled recently ? ?DVT prophylaxis: enoxaparin ?Code Status: Full  ?Family Communication:  ?Disposition: Status is: Inpatient ?Remains inpatient appropriate because: IV fluids required  ?  ?Consultants:  ?Surgery  ?Procedures:  ? ?Antimicrobials:  ?  ?Subjective: ?Pt reports he did have a bowel movement, he still has abdominal pain and bloating but has not been very severe.    ?Objective: ?Vitals:  ? 06/13/21 1402 06/13/21 2304 06/14/21 0349 06/14/21 1442  ?BP: 122/84 (!) 157/98 (!) 153/77 (!) 145/88  ?Pulse: 75 75 62 69  ?Resp: '20 19 18 18  '$ ?Temp: 98.1 ?F (36.7 ?C) 97.7 ?F (36.5 ?C) 97.8 ?F (36.6 ?C) 98.2 ?F (36.8 ?C)  ?TempSrc: Oral   Oral  ?SpO2: 97% 100% 98% 98%  ?Weight:      ?Height:      ? ? ?Intake/Output Summary (Last 24 hours) at 06/14/2021 1556 ?Last data filed at 06/14/2021 1300 ?Gross per 24 hour  ?Intake 480 ml  ?Output --  ?Net 480 ml  ? ? ?Filed Weights  ? 06/11/21 1446  ?Weight: 81.6 kg  ? ?Examination: ? ?General exam: NG tube in place.  Appears calm and NAD.  ?Respiratory system: Clear to auscultation. Respiratory effort normal. ?Cardiovascular system: normal S1 & S2 heard. No JVD, murmurs, rubs, gallops or clicks. No pedal edema. ?Gastrointestinal system: Abdomen is nondistended, soft and tender to light palpation. No  organomegaly or masses felt. Normal bowel sounds heard. ?Central nervous system: Alert and oriented. No focal neurological deficits. ?Extremities: Symmetric 5 x 5 power. ?Skin: No rashes, lesions or ulcers. ?Psychiatry: Judgement and insight appear normal. Mood & affect appropriate.  ? ?Data Reviewed: I have personally reviewed following labs and imaging  studies ? ?CBC: ?Recent Labs  ?Lab 06/11/21 ?1604 06/12/21 ?0416 06/13/21 ?2130 06/14/21 ?8657  ?WBC 15.0* 12.6* 14.9* 10.4  ?NEUTROABS 12.8*  --  11.2* 7.6  ?HGB 17.9* 17.0 13.9 13.8  ?HCT 51.8 48.9 41.1 40.1  ?MCV 90.9 91.4 94.9 94.1  ?PLT 312 274 232 178  ? ? ?Basic Metabolic Panel: ?Recent Labs  ?Lab 06/11/21 ?1546 06/12/21 ?0416 06/13/21 ?8469 06/14/21 ?6295  ?NA 133* 135 136 135  ?K 4.9 4.2 4.0 4.0  ?CL 92* 98 101 103  ?CO2 '26 28 30 28  '$ ?GLUCOSE 172* 166* 96 99  ?BUN 7* '8 13 13  '$ ?CREATININE 0.63 0.56* 0.58* 0.64  ?CALCIUM 10.0 9.6 9.2 8.7*  ?MG  --   --  2.1  --   ? ? ?CBG: ?No results for input(s): GLUCAP in the last 168 hours. ? ?No results found for this or any previous visit (from the past 240 hour(s)).  ? ?Radiology Studies: ?DG Abd 1 View ? ?Result Date: 06/13/2021 ?CLINICAL DATA:  Small-bowel obstruction EXAM: ABDOMEN - 1 VIEW COMPARISON:  Radiograph dated Jun 11, 2021 FINDINGS: Enteric tube tip is positioned near the GE junction. Persistent dilated loops of small bowel. IMPRESSION: 1. Enteric tube tip is positioned near the GE junction, recommend advancement. 2. Persistent air-filled dilated loops of small bowel, compatible with small bowel obstruction. Electronically Signed   By: Yetta Glassman M.D.   On: 06/13/2021 11:00   ? ?Scheduled Meds: ? bisacodyl  10 mg Rectal Daily  ? enoxaparin (LOVENOX) injection  40 mg Subcutaneous M84X  ? folic acid  1 mg Intravenous Daily  ? ketorolac  15 mg Intravenous Q6H  ? LORazepam  0.5 mg Intravenous Q6H  ? methylPREDNISolone (SOLU-MEDROL) injection  20 mg Intravenous Daily  ? metoprolol tartrate  2.5 mg Intravenous Q6H  ? pantoprazole (PROTONIX) IV  40 mg Intravenous Q12H  ? thiamine  100 mg Intravenous Daily  ? ?Continuous Infusions: ? lactated ringers 75 mL/hr at 06/11/21 2105  ? ? LOS: 3 days  ? ?Time spent: 35 mins ? ?Irwin Brakeman, MD ?How to contact the [facility] Attending or Consulting provider [place] or covering provider during after hours [place], for this  patient?  ?Check the care team in [facility] and look for a) attending/consulting [facility] provider listed and b) the [facility] team listed ?Log into [email or web address] and use [place]'s universal password to access. If you do not have the password, please contact the hospital operator. ?Locate the [facility] provider you are looking for under [facility] and page to a number that you can be directly reached. ?If you still have difficulty reaching the provider, please page the [facility] (Director on Call) for the Hospitalists listed on amion for assistance. ? ?06/14/2021, 3:56 PM  ? ? ?

## 2021-06-14 NOTE — Progress Notes (Signed)
Patients NG tube connected to suction per MD Pappayliou. Patient had 175 ml output via tube. MD Pappayliou made aware. New orders placed. Patients NG tube removed, patient reported no complaints during removal. Patient tolerating full liquids with no complaints of nausea or vomiting. ?

## 2021-06-14 NOTE — Progress Notes (Signed)
Patients blood pressure 169/89, Pulse 63. Patient has Lopressor 2.5 ml ordered IV per order hold if pulse less than 75. MD Wynetta Emery made aware. Per MD hold Lopressor.  ? ? ?

## 2021-06-14 NOTE — Progress Notes (Signed)
[facility] Progress Note ? ?   ?Subjective: ?Patient seen and examined.  He is sitting in the chair at the side of the bed.  NG tube is clamped, nausea or vomiting.  He is able to tolerate clear liquids.  He continues confirming passing flatus, and had a moderate-sized bowel movement yesterday.  He denies any abdominal pain at this time. ? ?Objective: ?Vital signs in last 24 hours: ?Temp:  [97.7 ?F (36.5 ?C)-98.1 ?F (36.7 ?C)] 97.8 ?F (36.6 ?C) (05/13 0349) ?Pulse Rate:  [62-83] 62 (05/13 0349) ?Resp:  [18-20] 18 (05/13 0349) ?BP: (122-157)/(77-98) 153/77 (05/13 0349) ?SpO2:  [97 %-100 %] 98 % (05/13 0349) ?Last BM Date : 06/13/21 ? ?Intake/Output from previous day: ?05/12 0701 - 05/13 0700 ?In: 240 [P.O.:240] ?Out: 300 [Emesis/NG output:300] ?Intake/Output this shift: ?No intake/output data recorded. ? ?General appearance: alert, cooperative, and no distress ?GI: Abdomen soft, nondistended, no percussion tenderness, nontender to palpation; no rigidity, guarding, rebound tenderness ? ?Lab Results:  ?Recent Labs  ?  06/13/21 ?4128 06/14/21 ?7867  ?WBC 14.9* 10.4  ?HGB 13.9 13.8  ?HCT 41.1 40.1  ?PLT 232 178  ? ?BMET ?Recent Labs  ?  06/13/21 ?6720 06/14/21 ?9470  ?NA 136 135  ?K 4.0 4.0  ?CL 101 103  ?CO2 30 28  ?GLUCOSE 96 99  ?BUN 13 13  ?CREATININE 0.58* 0.64  ?CALCIUM 9.2 8.7*  ? ?PT/INR ?No results for input(s): LABPROT, INR in the last 72 hours. ? ?Studies/Results: ?DG Abd 1 View ? ?Result Date: 06/13/2021 ?CLINICAL DATA:  Small-bowel obstruction EXAM: ABDOMEN - 1 VIEW COMPARISON:  Radiograph dated Jun 11, 2021 FINDINGS: Enteric tube tip is positioned near the GE junction. Persistent dilated loops of small bowel. IMPRESSION: 1. Enteric tube tip is positioned near the GE junction, recommend advancement. 2. Persistent air-filled dilated loops of small bowel, compatible with small bowel obstruction. Electronically Signed   By: Yetta Glassman M.D.   On: 06/13/2021 11:00    ? ?Anti-infectives: ?Anti-infectives (From admission, onward)  ? ? None  ? ?  ? ? ?Assessment/Plan: ? ?Patient is 66 year old male who was admitted with small bowel obstruction.  CT abdomen pelvis demonstrates dilated loops of small bowel, compatible with small bowel obstruction, no transition point clearly identified but probably in the right abdomen associated with mesenteric thickening from peritoneal carcinomatosis. ? ?-Leukocytosis normalized today ?-KUB demonstrated dilated loops of small bowel, however there was gas noted within the colon which was previously not present ?-NG tube clamped overnight, will place on suction for 1 hour and evaluate output ?-If no significant output, will remove NG tube and advance patient to full liquid diet ?-Continue with Dulcolax suppositories ?-I again reiterated to the patient and his daughter that we are going to take things slow and see how he progresses, but if he starts to have nausea and vomiting, NG tube may need to be replaced if we remove it ?-Patient and his daughter are also understanding that even if this bowel obstruction resolves, he is at significant risk for another bowel obstruction in the future ?-Continue to monitor bowel function ?-Appreciate hospitalist recommendations ? ? LOS: 3 days  ? ? ?Pegeen Stiger A Rishabh Rinkenberger ?06/14/2021 ? ?

## 2021-06-15 DIAGNOSIS — I25118 Atherosclerotic heart disease of native coronary artery with other forms of angina pectoris: Secondary | ICD-10-CM | POA: Diagnosis not present

## 2021-06-15 DIAGNOSIS — K56609 Unspecified intestinal obstruction, unspecified as to partial versus complete obstruction: Secondary | ICD-10-CM | POA: Diagnosis not present

## 2021-06-15 DIAGNOSIS — E782 Mixed hyperlipidemia: Secondary | ICD-10-CM | POA: Diagnosis not present

## 2021-06-15 DIAGNOSIS — C220 Liver cell carcinoma: Secondary | ICD-10-CM | POA: Diagnosis not present

## 2021-06-15 LAB — COMPREHENSIVE METABOLIC PANEL
ALT: 18 U/L (ref 0–44)
AST: 21 U/L (ref 15–41)
Albumin: 2.9 g/dL — ABNORMAL LOW (ref 3.5–5.0)
Alkaline Phosphatase: 57 U/L (ref 38–126)
Anion gap: 4 — ABNORMAL LOW (ref 5–15)
BUN: 10 mg/dL (ref 8–23)
CO2: 30 mmol/L (ref 22–32)
Calcium: 8.9 mg/dL (ref 8.9–10.3)
Chloride: 105 mmol/L (ref 98–111)
Creatinine, Ser: 0.63 mg/dL (ref 0.61–1.24)
GFR, Estimated: >60 mL/min (ref >60)
Glucose, Bld: 96 mg/dL (ref 70–99)
Potassium: 4.4 mmol/L (ref 3.5–5.1)
Sodium: 139 mmol/L (ref 135–145)
Total Bilirubin: 1.3 mg/dL — ABNORMAL HIGH (ref 0.3–1.2)
Total Protein: 6.1 g/dL — ABNORMAL LOW (ref 6.5–8.1)

## 2021-06-15 LAB — CBC WITH DIFFERENTIAL/PLATELET
Abs Immature Granulocytes: 0.02 10*3/uL (ref 0.00–0.07)
Basophils Absolute: 0 10*3/uL (ref 0.0–0.1)
Basophils Relative: 0 %
Eosinophils Absolute: 0.1 10*3/uL (ref 0.0–0.5)
Eosinophils Relative: 2 %
HCT: 41.9 % (ref 39.0–52.0)
Hemoglobin: 14.5 g/dL (ref 13.0–17.0)
Immature Granulocytes: 0 %
Lymphocytes Relative: 19 %
Lymphs Abs: 1.3 10*3/uL (ref 0.7–4.0)
MCH: 32.2 pg (ref 26.0–34.0)
MCHC: 34.6 g/dL (ref 30.0–36.0)
MCV: 92.9 fL (ref 80.0–100.0)
Monocytes Absolute: 0.7 10*3/uL (ref 0.1–1.0)
Monocytes Relative: 11 %
Neutro Abs: 4.6 10*3/uL (ref 1.7–7.7)
Neutrophils Relative %: 68 %
Platelets: 178 10*3/uL (ref 150–400)
RBC: 4.51 MIL/uL (ref 4.22–5.81)
RDW: 14.3 % (ref 11.5–15.5)
WBC: 6.8 10*3/uL (ref 4.0–10.5)
nRBC: 0 % (ref 0.0–0.2)

## 2021-06-15 MED ORDER — DOCUSATE SODIUM 100 MG PO CAPS: 100.0000 mg | ORAL_CAPSULE | Freq: Two times a day (BID) | ORAL | 0 refills | Status: AC

## 2021-06-15 MED ORDER — SENNOSIDES-DOCUSATE SODIUM 8.6-50 MG PO TABS
2.0000 | ORAL_TABLET | Freq: Two times a day (BID) | ORAL | Status: DC
  Administered 2021-06-15: 2 via ORAL
  Filled 2021-06-15: qty 2

## 2021-06-15 NOTE — Discharge Summary (Addendum)
Physician Discharge Summary  Logan Alvarez UVO:[id number] DOB: 10-21-1955 DOA: 06/11/2021  PCP: Babs Sciara, MD  Admit date: 06/11/2021 Discharge date: 06/15/2021  Admitted From:   Home  Disposition:  Home   Recommendations for Outpatient Follow-up:  Follow up with Dr. Ellin Saba in 1 week for recheck Pt at HIGH RISK for recurrence of bowel obstruction due to peritoneal carcinomatosis Please consider ambulatory referral to palliative care for further goals of care discussion  Discharge Condition: STABLE BUT GUARDED  CODE STATUS: FULL   DIET: SOFT FOODS RECOMMENDED  Brief Hospitalization Summary: Please see all hospital notes, images, labs for full details of the hospitalization. 66 y.o. male,  with medical history significant for stage IV hepatocellular carcinoma, coronary artery disease, hyperlipidemia, hypertension, testosterone deficiency who who presents due to complaints of abdominal pain, nausea and vomiting, reports abdominal pain over the last 24 hours, now just over last few days, he reports he did develop vomiting, where he had 7 episodes of vomiting so far, he denies any coffee-ground emesis, any melena, or bright red blood per rectum, no diarrhea, reports he is passing gas, he had BM today, patient currently on chemotherapy for his epidural carcinoma with carcinomatosis, managed by Dr. Ellin Saba, last dose 5/2 -In ED work-up significant for leukocytosis at 15K, sodium low at 133, CT abdomen pelvis significant for small bowel obstruction related to carcinomatosis, ED discussed with general surgery who recommended NG tube insertion on ILS, and hospitalist to admit for further management.  HOSPITAL COURSE BY PROBLEM LIST  Assessment and Plan: * SBO (small bowel obstruction) (HCC) -- secondary to carcinomatosis -- NG tube removed and now tolerating soft diet -- Pt having flatus and BM, diet advanced per surgery  -- appreciate surgery consultation -- Pt reports 2 bowel  movements and insists on going home today.  -- pt and family made aware that he remains at high risk for recurrence of bowel obstruction and verbalized understanding  Alcohol dependence (HCC) -- pt assessed by Child psychotherapist and reported he is no longer drinking alcohol so will d/c CIWA   Portal hypertension (HCC) -- continue beta blocker therapy   Peritoneal carcinomatosis (HCC) -- high risk for recurrence of bowel obstruction, patient and daughter aware -- follow up with Dr. Ellin Saba   Coronary artery disease of native heart with stable angina pectoris, unspecified vessel or lesion type (HCC) -- temporarily held aspirin / plavix just in case he needs procedures -- can resume aspirin/plavix now that bowel obstruction resolved  Hepatocellular carcinoma (HCC) -- followed by Dr. Kirtland Bouchard and on chemotherapy -- now with carcinomatosis  -- palliative medicine consultation appreciated  -- notified Dr. Kirtland Bouchard of admission  -- follow up with Dr. Kirtland Bouchard for ongoing management   Anxiety -- he is on lorazepam   Mixed hyperlipidemia -- has been diet controlled recently  Discharge Diagnoses:  Principal Problem:   SBO (small bowel obstruction) (HCC) Active Problems:   Mixed hyperlipidemia   Anxiety   Hepatocellular carcinoma (HCC)   Coronary artery disease of native heart with stable angina pectoris, unspecified vessel or lesion type (HCC)   Peritoneal carcinomatosis (HCC)   Portal hypertension (HCC)   Alcohol dependence (HCC)   Discharge Instructions: Discharge Instructions     Ambulatory referral to Hematology / Oncology   Complete by: As directed    Hospital follow up      Allergies as of 06/15/2021       Reactions   Cymbalta [duloxetine Hcl]    Bad dreams  Lodine [etodolac] Nausea And Vomiting   Zoloft [sertraline Hcl]    REDUCED URINARY FLOW,NOCTURIA        Medication List     TAKE these medications    ALPRAZolam 0.5 MG tablet Commonly known as: XANAX Take one tablet po  QID   aspirin EC 81 MG tablet Take 1 tablet (81 mg total) by mouth daily. Swallow whole.   clopidogrel 75 MG tablet Commonly known as: PLAVIX Take 1 tablet (75 mg total) by mouth daily.   docusate sodium 100 MG capsule Commonly known as: Colace Take 1 capsule (100 mg total) by mouth 2 (two) times daily.   hydrocortisone 2.5 % rectal cream Commonly known as: ANUSOL-HC Place 1 application. rectally 2 (two) times daily.   losartan 25 MG tablet Commonly known as: COZAAR Take 1 tablet (25 mg total) by mouth in the morning.   metoprolol tartrate 25 MG tablet Commonly known as: LOPRESSOR Take 0.5 tablets (12.5 mg total) by mouth 2 (two) times daily.   nitroGLYCERIN 0.4 MG SL tablet Commonly known as: NITROSTAT Place 1 tablet (0.4 mg total) under the tongue every 5 (five) minutes as needed for chest pain.   oxyCODONE 5 MG immediate release tablet Commonly known as: Oxy IR/ROXICODONE Take 1 tablet (5 mg total) by mouth every 4 (four) hours as needed (pain).   predniSONE 20 MG tablet Commonly known as: DELTASONE Take 0.5 tablets (10 mg total) by mouth daily with breakfast.   prochlorperazine 10 MG tablet Commonly known as: COMPAZINE Take 1 tablet (10 mg total) by mouth every 6 (six) hours as needed for nausea or vomiting.   sodium chloride 0.65 % nasal spray Commonly known as: OCEAN 1 spray as needed for congestion.        Follow-up Information     Doreatha Massed, MD. Schedule an appointment as soon as possible for a visit in 1 week(s).   Specialty: Hematology Why: Hospital Follow Up Contact information: [address] [place] [address] [phone]         PappayliouGustavus Messing, DO. Schedule an appointment as soon as possible for a visit.   Specialty: General Surgery Why: As needed Contact information: [address] [place] [facility] [address] [phone]                Allergies  Allergen Reactions   Cymbalta [Duloxetine Hcl]     Bad  dreams   Lodine [Etodolac] Nausea And Vomiting   Zoloft [Sertraline Hcl]     REDUCED URINARY FLOW,NOCTURIA   Allergies as of 06/15/2021       Reactions   Cymbalta [duloxetine Hcl]    Bad dreams   Lodine [etodolac] Nausea And Vomiting   Zoloft [sertraline Hcl]    REDUCED URINARY FLOW,NOCTURIA        Medication List     TAKE these medications    ALPRAZolam 0.5 MG tablet Commonly known as: XANAX Take one tablet po QID   aspirin EC 81 MG tablet Take 1 tablet (81 mg total) by mouth daily. Swallow whole.   clopidogrel 75 MG tablet Commonly known as: PLAVIX Take 1 tablet (75 mg total) by mouth daily.   docusate sodium 100 MG capsule Commonly known as: Colace Take 1 capsule (100 mg total) by mouth 2 (two) times daily.   hydrocortisone 2.5 % rectal cream Commonly known as: ANUSOL-HC Place 1 application. rectally 2 (two) times daily.   losartan 25 MG tablet Commonly known as: COZAAR Take 1 tablet (25 mg total)  by mouth in the morning.   metoprolol tartrate 25 MG tablet Commonly known as: LOPRESSOR Take 0.5 tablets (12.5 mg total) by mouth 2 (two) times daily.   nitroGLYCERIN 0.4 MG SL tablet Commonly known as: NITROSTAT Place 1 tablet (0.4 mg total) under the tongue every 5 (five) minutes as needed for chest pain.   oxyCODONE 5 MG immediate release tablet Commonly known as: Oxy IR/ROXICODONE Take 1 tablet (5 mg total) by mouth every 4 (four) hours as needed (pain).   predniSONE 20 MG tablet Commonly known as: DELTASONE Take 0.5 tablets (10 mg total) by mouth daily with breakfast.   prochlorperazine 10 MG tablet Commonly known as: COMPAZINE Take 1 tablet (10 mg total) by mouth every 6 (six) hours as needed for nausea or vomiting.   sodium chloride 0.65 % nasal spray Commonly known as: OCEAN 1 spray as needed for congestion.        Procedures/Studies: DG Abd 1 View  Result Date: 06/13/2021 CLINICAL DATA:  Small-bowel obstruction EXAM: ABDOMEN - 1 VIEW  COMPARISON:  Radiograph dated Jun 11, 2021 FINDINGS: Enteric tube tip is positioned near the GE junction. Persistent dilated loops of small bowel. IMPRESSION: 1. Enteric tube tip is positioned near the GE junction, recommend advancement. 2. Persistent air-filled dilated loops of small bowel, compatible with small bowel obstruction. Electronically Signed   By: Allegra Lai M.D.   On: 06/13/2021 11:00   DG Abdomen 1 View  Result Date: 06/11/2021 CLINICAL DATA:  Nasogastric tube placement. EXAM: ABDOMEN - 1 VIEW COMPARISON:  None Available. FINDINGS: The bowel gas pattern is normal. Distal tip of nasogastric tube is seen in proximal stomach just beyond gastroesophageal junction. No radio-opaque calculi or other significant radiographic abnormality are seen. IMPRESSION: Distal tip of nasogastric tube seen in proximal stomach just beyond expected position of gastroesophageal junction. Advancement is recommended. Electronically Signed   By: Lupita Raider M.D.   On: 06/11/2021 20:49   CT Abdomen Pelvis W Contrast  Result Date: 06/11/2021 CLINICAL DATA:  Abdominal pain. Bowel obstruction suspected. History of hepatocellular carcinoma with peritoneal carcinomatosis. History of partial hepatectomy. EXAM: CT ABDOMEN AND PELVIS WITH CONTRAST TECHNIQUE: Multidetector CT imaging of the abdomen and pelvis was performed using the standard protocol following bolus administration of intravenous contrast. RADIATION DOSE REDUCTION: This exam was performed according to the departmental dose-optimization program which includes automated exposure control, adjustment of the mA and/or kV according to patient size and/or use of iterative reconstruction technique. CONTRAST:  OMNIPAQUE IOHEXOL 300 MG/ML  SOLN COMPARISON:  CT chest abdomen pelvis 05/22/2021 FINDINGS: Lower chest: Lung bases are clear.  No pleural effusions. Hepatobiliary: Subcentimeter hypodensities in the liver are again noted. History of partial  hepatectomy involving segments 5 and 6. Main portal venous system is patent. No significant biliary dilatation. Gallbladder appears to be surgically absent. Liver has a slightly nodular contour and compatible with underlying cirrhosis. Pancreas: Unremarkable. No pancreatic ductal dilatation or surrounding inflammatory changes. Spleen: Normal in size without focal abnormality. Adrenals/Urinary Tract: Normal adrenal glands. Again noted is a low-density structure in the right kidney lower pole that is suggestive for a cyst and does not require dedicated follow-up. No suspicious renal lesions. Negative for hydronephrosis. Mild distention of the urinary bladder. Stomach/Bowel: Small hiatal hernia. Stomach is mildly distended with fluid. Multiple fluid-filled loops of small bowel abdomen measuring up to 3.5 cm in diameter. Distal small bowel is decompressed. Findings are suggestive for a small bowel obstruction. Transition point is likely in the  right abdomen near sequence 2 image 54. In addition, there may be distended or fluid-filled appendix which is similar to the prior examination. Vascular/Lymphatic: Atherosclerotic calcifications in the aorta and iliac arteries without aneurysm. Main visceral arteries are patent. No significant lymph node enlargement in the abdomen or pelvis. Reproductive: Prostate is unremarkable. Other: Again noted is abnormal soft tissue involving the anterior peritoneum and omentum. There is also soft tissue thickening or fullness along the right abdominal mesentery which is probably near the small bowel transition point. Findings are compatible with peritoneal carcinomatosis. Small amount of ascites in the abdomen which is new. Negative for free air. Musculoskeletal: Stable sclerosis involving the left iliac wing. No acute bone abnormality. Stable compression deformity involving the L1 vertebral body. No acute bone abnormality. IMPRESSION: 1. Dilated loops of small bowel are most compatible  with a small bowel obstruction. Distal small bowel is decompressed and transition point is not clearly identified but probably in the right abdomen associated with mesenteric thickening and peritoneal disease. 2. Again noted is abnormal peritoneal and mesenteric soft tissue that is compatible with peritoneal carcinomatosis. Small amount of ascites is new. 3.  Aortic Atherosclerosis (ICD10-I70.0). 4. Cirrhosis. Electronically Signed   By: Richarda Overlie M.D.   On: 06/11/2021 18:04   CT CHEST ABDOMEN PELVIS W CONTRAST  Result Date: 05/22/2021 CLINICAL DATA:  Hepatocellular carcinoma restaging, peritoneal carcinomatosis * Tracking Code: BO * EXAM: CT CHEST, ABDOMEN, AND PELVIS WITH CONTRAST TECHNIQUE: Multidetector CT imaging of the chest, abdomen and pelvis was performed following the standard protocol during bolus administration of intravenous contrast. RADIATION DOSE REDUCTION: This exam was performed according to the departmental dose-optimization program which includes automated exposure control, adjustment of the mA and/or kV according to patient size and/or use of iterative reconstruction technique. CONTRAST:  85mL OMNIPAQUE IOHEXOL 300 MG/ML SOLN, additional oral enteric contrast COMPARISON:  03/21/2021 FINDINGS: CT CHEST FINDINGS Cardiovascular: Aortic atherosclerosis. Normal heart size. Three-vessel coronary artery calcifications. No pericardial effusion. Mediastinum/Nodes: No enlarged mediastinal, hilar, or axillary lymph nodes. Thyroid gland, trachea, and esophagus demonstrate no significant findings. Lungs/Pleura: 0.5 cm nodule of the anterior left upper lobe is diminished in solid character (series 4, image 49). Additional unchanged small pulmonary nodules of the dependent left lower lobe measuring 0.3 cm (series 4, image 111, 108). No pleural effusion or pneumothorax. Musculoskeletal: No chest wall mass or suspicious osseous lesions identified. CT ABDOMEN PELVIS FINDINGS Hepatobiliary: Coarse, nodular,  cirrhotic morphology of the liver. Unchanged subcentimeter low-attenuation lesions of the left and right lobe of the liver (series 2, image 53, 62). Status post cholecystectomy. No biliary ductal dilatation. Pancreas: Unremarkable. No pancreatic ductal dilatation or surrounding inflammatory changes. Spleen: Normal in size without significant abnormality. Adrenals/Urinary Tract: Adrenal glands are unremarkable. Kidneys are normal, without renal calculi, solid lesion, or hydronephrosis. Bladder is unremarkable. Stomach/Bowel: Stomach is within normal limits. No evidence of bowel wall thickening, distention, or inflammatory changes. Vascular/Lymphatic: Aortic atherosclerosis. No enlarged abdominal or pelvic lymph nodes. Reproductive: No mass or other abnormality. Other: No abdominal wall hernia or abnormality. No ascites. Unchanged diffuse omental and mesenteric stranding and diffuse peritoneal thickening throughout the abdomen pelvis (series 2, image 76, 67). Musculoskeletal: No acute osseous findings. Unchanged wedge deformity of L1. IMPRESSION: 1. Unchanged diffuse omental and mesenteric metastatic disease and diffuse peritoneal thickening throughout the abdomen pelvis. 2. No evidence of new metastatic disease in the chest, abdomen, or pelvis. 3. Unchanged subcentimeter low-attenuation lesions of the left and right lobe of the liver. Attention on follow-up. 4.  A previously seen 0.5 cm nodule of the anterior left upper lobe is diminished in solid character, consistent with residua of infection or inflammation. 5. Cirrhosis. 6. Coronary artery disease. Aortic Atherosclerosis (ICD10-I70.0). Electronically Signed   By: Jearld Lesch M.D.   On: 05/22/2021 15:57     Subjective: Pt insists on going home today.  He has tolerated soft diet and he has had 2 bowel movements today.  He reports that he understands that he is at high risk for recurrence of his bowel obstruction.    Discharge Exam: Vitals:   06/15/21 0409  06/15/21 1359  BP: (!) 194/100 (!) 168/93  Pulse: 67 76  Resp: 20 18  Temp: (!) 97.4 F (36.3 C) (!) 97.5 F (36.4 C)  SpO2: 98% 96%   Vitals:   06/14/21 1442 06/14/21 2121 06/15/21 0409 06/15/21 1359  BP: (!) 145/88 (!) 169/96 (!) 194/100 (!) 168/93  Pulse: 69 72 67 76  Resp: 18 20 20 18   Temp: 98.2 F (36.8 C) 97.9 F (36.6 C) (!) 97.4 F (36.3 C) (!) 97.5 F (36.4 C)  TempSrc: Oral Oral Oral Oral  SpO2: 98% 95% 98% 96%  Weight:      Height:       General: Pt is alert, awake, not in acute distress Cardiovascular: normal S1/S2 +, no rubs, no gallops Respiratory: CTA bilaterally, no wheezing, no rhonchi Abdominal: Soft, NT, ND, bowel sounds + Extremities: no edema, no cyanosis   The results of significant diagnostics from this hospitalization (including imaging, microbiology, ancillary and laboratory) are listed below for reference.     Microbiology: No results found for this or any previous visit (from the past 240 hour(s)).   Labs: BNP (last 3 results) Recent Labs    12/05/20 0839  BNP 36.3   Basic Metabolic Panel: Recent Labs  Lab 06/11/21 1546 06/12/21 0416 06/13/21 0528 06/14/21 0524 06/15/21 0457  NA 133* 135 136 135 139  K 4.9 4.2 4.0 4.0 4.4  CL 92* 98 101 103 105  CO2 26 28 30 28 30   GLUCOSE 172* 166* 96 99 96  BUN 7* 8 13 13 10   CREATININE 0.63 0.56* 0.58* 0.64 0.63  CALCIUM 10.0 9.6 9.2 8.7* 8.9  MG  --   --  2.1  --   --    Liver Function Tests: Recent Labs  Lab 06/11/21 1546 06/13/21 0528 06/14/21 0524 06/15/21 0457  AST 34 20 17 21   ALT 43 25 21 18   ALKPHOS 87 58 54 57  BILITOT 2.0* 1.7* 1.6* 1.3*  PROT 8.1 5.9* 5.6* 6.1*  ALBUMIN 4.1 2.9* 2.9* 2.9*   Recent Labs  Lab 06/11/21 1546  LIPASE 26   No results for input(s): AMMONIA in the last 168 hours. CBC: Recent Labs  Lab 06/11/21 1604 06/12/21 0416 06/13/21 0528 06/14/21 0524 06/15/21 0457  WBC 15.0* 12.6* 14.9* 10.4 6.8  NEUTROABS 12.8*  --  11.2* 7.6 4.6  HGB  17.9* 17.0 13.9 13.8 14.5  HCT 51.8 48.9 41.1 40.1 41.9  MCV 90.9 91.4 94.9 94.1 92.9  PLT 312 274 232 178 178   Cardiac Enzymes: No results for input(s): CKTOTAL, CKMB, CKMBINDEX, TROPONINI in the last 168 hours. BNP: Invalid input(s): POCBNP CBG: No results for input(s): GLUCAP in the last 168 hours. D-Dimer No results for input(s): DDIMER in the last 72 hours. Hgb A1c No results for input(s): HGBA1C in the last 72 hours. Lipid Profile No results for input(s): CHOL, HDL, LDLCALC, TRIG, CHOLHDL, LDLDIRECT  in the last 72 hours. Thyroid function studies No results for input(s): TSH, T4TOTAL, T3FREE, THYROIDAB in the last 72 hours.  Invalid input(s): FREET3 Anemia work up No results for input(s): VITAMINB12, FOLATE, FERRITIN, TIBC, IRON, RETICCTPCT in the last 72 hours. Urinalysis    Component Value Date/Time   COLORURINE YELLOW 06/11/2021 1448   APPEARANCEUR CLEAR 06/11/2021 1448   LABSPEC >1.046 (H) 06/11/2021 1448   PHURINE 7.0 06/11/2021 1448   GLUCOSEU NEGATIVE 06/11/2021 1448   HGBUR NEGATIVE 06/11/2021 1448   BILIRUBINUR NEGATIVE 06/11/2021 1448   KETONESUR NEGATIVE 06/11/2021 1448   PROTEINUR 100 (A) 06/11/2021 1448   NITRITE NEGATIVE 06/11/2021 1448   LEUKOCYTESUR NEGATIVE 06/11/2021 1448   Sepsis Labs Invalid input(s): PROCALCITONIN,  WBC,  LACTICIDVEN Microbiology No results found for this or any previous visit (from the past 240 hour(s)).  Time coordinating discharge: 36 mins  SIGNED:  Standley Dakins, MD  [facility] 06/15/2021, 2:29 PM How to contact the [facility] Attending or Consulting provider 7A - 7P or covering provider during after hours 7P -7A, for this patient?  Check the care team in [facility] and look for a) attending/consulting [facility] provider listed and b) the [facility] team listed Log into [email or web address] and use [place]'s universal password to access. If you do not have the password, please contact the hospital operator. Locate the [facility] provider you  are looking for under [facility] and page to a number that you can be directly reached. If you still have difficulty reaching the provider, please page the [facility] (Director on Call) for the Hospitalists listed on amion for assistance.

## 2021-06-15 NOTE — Discharge Instructions (Signed)
IMPORTANT INFORMATION: PAY CLOSE ATTENTION   PHYSICIAN DISCHARGE INSTRUCTIONS  Follow with Primary care provider  Luking, Scott A, MD  and other consultants as instructed by your Hospitalist Physician  SEEK MEDICAL CARE OR RETURN TO EMERGENCY ROOM IF SYMPTOMS COME BACK, WORSEN OR NEW PROBLEM DEVELOPS   Please note: You were cared for by a hospitalist during your hospital stay. Every effort will be made to forward records to your primary care provider.  You can request that your primary care provider send for your hospital records if they have not received them.  Once you are discharged, your primary care physician will handle any further medical issues. Please note that NO REFILLS for any discharge medications will be authorized once you are discharged, as it is imperative that you return to your primary care physician (or establish a relationship with a primary care physician if you do not have one) for your post hospital discharge needs so that they can reassess your need for medications and monitor your lab values.  Please get a complete blood count and chemistry panel checked by your Primary MD at your next visit, and again as instructed by your Primary MD.  Get Medicines reviewed and adjusted: Please take all your medications with you for your next visit with your Primary MD  Laboratory/radiological data: Please request your Primary MD to go over all hospital tests and procedure/radiological results at the follow up, please ask your primary care provider to get all Hospital records sent to his/her office.  In some cases, they will be blood work, cultures and biopsy results pending at the time of your discharge. Please request that your primary care provider follow up on these results.  If you are diabetic, please bring your blood sugar readings with you to your follow up appointment with primary care.    Please call and make your follow up appointments as soon as possible.    Also Note  the following: If you experience worsening of your admission symptoms, develop shortness of breath, life threatening emergency, suicidal or homicidal thoughts you must seek medical attention immediately by calling 911 or calling your MD immediately  if symptoms less severe.  You must read complete instructions/literature along with all the possible adverse reactions/side effects for all the Medicines you take and that have been prescribed to you. Take any new Medicines after you have completely understood and accpet all the possible adverse reactions/side effects.   Do not drive when taking Pain medications or sleeping medications (Benzodiazepines)  Do not take more than prescribed Pain, Sleep and Anxiety Medications. It is not advisable to combine anxiety,sleep and pain medications without talking with your primary care practitioner  Special Instructions: If you have smoked or chewed Tobacco  in the last 2 yrs please stop smoking, stop any regular Alcohol  and or any Recreational drug use.  Wear Seat belts while driving.  Do not drive if taking any narcotic, mind altering or controlled substances or recreational drugs or alcohol.       

## 2021-06-15 NOTE — Progress Notes (Signed)
Patient has been stable this shift. No nausea or vomiting this shift.  ?

## 2021-06-15 NOTE — Progress Notes (Addendum)
[facility] Progress Note ? ?   ?Subjective: ?Patient seen and examined.  He is sitting in chair at the side of the bed.  He has no complaints at this time.  He tolerated full liquids without nausea and vomiting.  He confirms passing flatus, and had a small to moderate-sized bowel movement yesterday and again this morning.  He denies abdominal pain currently.  Afebrile. ? ?Objective: ?Vital signs in last 24 hours: ?Temp:  [97.4 ?F (36.3 ?C)-98.2 ?F (36.8 ?C)] 97.4 ?F (36.3 ?C) (05/14 0409) ?Pulse Rate:  [67-72] 67 (05/14 0409) ?Resp:  [18-20] 20 (05/14 0409) ?BP: (145-194)/(88-100) 194/100 (05/14 0409) ?SpO2:  [95 %-98 %] 98 % (05/14 0409) ?Last BM Date : 06/13/21 ? ?Intake/Output from previous day: ?05/13 0701 - 05/14 0700 ?In: 480 [P.O.:480] ?Out: -  ?Intake/Output this shift: ?No intake/output data recorded. ? ?General appearance: alert, cooperative, and no distress ?GI: Abdomen soft, mild distention, no percussion tenderness, nontender to palpation; no rigidity, guarding, rebound tenderness ? ?Lab Results:  ?Recent Labs  ?  06/14/21 ?1505 06/15/21 ?0457  ?WBC 10.4 6.8  ?HGB 13.8 14.5  ?HCT 40.1 41.9  ?PLT 178 178  ? ?BMET ?Recent Labs  ?  06/14/21 ?6979 06/15/21 ?0457  ?NA 135 139  ?K 4.0 4.4  ?CL 103 105  ?CO2 28 30  ?GLUCOSE 99 96  ?BUN 13 10  ?CREATININE 0.64 0.63  ?CALCIUM 8.7* 8.9  ? ?PT/INR ?No results for input(s): LABPROT, INR in the last 72 hours. ? ?Studies/Results: ?DG Abd 1 View ? ?Result Date: 06/13/2021 ?CLINICAL DATA:  Small-bowel obstruction EXAM: ABDOMEN - 1 VIEW COMPARISON:  Radiograph dated Jun 11, 2021 FINDINGS: Enteric tube tip is positioned near the GE junction. Persistent dilated loops of small bowel. IMPRESSION: 1. Enteric tube tip is positioned near the GE junction, recommend advancement. 2. Persistent air-filled dilated loops of small bowel, compatible with small bowel obstruction. Electronically Signed   By: Yetta Glassman M.D.   On: 06/13/2021 11:00    ? ?Anti-infectives: ?Anti-infectives (From admission, onward)  ? ? None  ? ?  ? ? ?Assessment/Plan: ? ?Patient is 66 year old male who was admitted with small bowel obstruction.  CT abdomen and pelvis demonstrates dilated loops of small bowel, compatible small bowel obstruction, no transition point clearly identified but probably in the right abdomen associated with mesenteric thickening from peritoneal carcinomatosis. ? ?-Patient with normal WBC count, 6.8 ?-Patient tolerated full liquid diet without nausea and vomiting, continues to pass flatus, and is having small bowel movements ?-Advance diet to GI soft ?-Will order Senokot S ?-Continue Dulcolax suppositories ?-Patient understands that if he starts to have nausea and vomiting, NG tube will need to be reinserted ?-Patient also understands that even if his bowel obstruction resolves, he is at significant risk for another bowel obstruction in the future ?-Continue to monitor bowel function ?-If patient able to tolerate soft diet without nausea and vomiting, and continues to move his bowels, will be stable for discharge ?-If patient tolerating diet and stable for discharge, may restart Plavix upon discharge ?-Appreciate hospitalist recommendations ? ? LOS: 4 days  ? ? ?Kiffany Schelling A Lannah Koike ?06/15/2021 ? ?

## 2021-06-16 ENCOUNTER — Encounter: Payer: Self-pay | Admitting: Hematology

## 2021-06-17 ENCOUNTER — Encounter: Payer: Self-pay | Admitting: Family Medicine

## 2021-06-17 ENCOUNTER — Ambulatory Visit (INDEPENDENT_AMBULATORY_CARE_PROVIDER_SITE_OTHER): Payer: Medicare Other | Admitting: Family Medicine

## 2021-06-17 VITALS — BP 126/82 | HR 56 | Temp 98.1°F | Wt 184.6 lb

## 2021-06-17 DIAGNOSIS — C786 Secondary malignant neoplasm of retroperitoneum and peritoneum: Secondary | ICD-10-CM | POA: Diagnosis not present

## 2021-06-17 DIAGNOSIS — K56600 Partial intestinal obstruction, unspecified as to cause: Secondary | ICD-10-CM | POA: Diagnosis not present

## 2021-06-17 DIAGNOSIS — C220 Liver cell carcinoma: Secondary | ICD-10-CM

## 2021-06-17 MED ORDER — NYSTATIN 100000 UNIT/ML MT SUSP: 5.0000 mL | Freq: Four times a day (QID) | OROMUCOSAL | 0 refills | Status: DC

## 2021-06-17 MED ORDER — OXYCODONE HCL 5 MG PO TABS: 5.0000 mg | ORAL_TABLET | ORAL | 0 refills | Status: DC | PRN

## 2021-06-17 NOTE — Progress Notes (Signed)
? ?  Subjective:  ? ? Patient ID: Logan Alvarez, male    DOB: 02-15-1955, 66 y.o.   MRN: [id number] ? ?HPI ?Pt had recent hospital stay over at [facility] Went in 06/11/21-06/15/21 for small bowel obstruction. Pt states he feels "puny". Pt would like to discuss pain med and diet. Throat irritated due to NG tube.  ?Long discussion held regarding his hospital course ?Patient not eating well because of sore throat ? ? ?Review of Systems ? ?   ?Objective:  ? Physical Exam ?Lungs clear heart regular pulse normal abdomen soft obese no guarding or rebound ? ? ? ?   ?Assessment & Plan:  ?1. Peritoneal carcinomatosis ([place]) ?Unfortunately this is getting worse ?Causing partial small bowel obstruction ?At some point in time may cause full obstruction ?General surgery felt like no surgery would be beneficial for him ?Will be seen oncology in the near future ?Will need further evaluation for if there is suitable treatments or if it is best to follow the path of palliative care ? ?Patient is getting to the point of excepting the fact that palliative hospice care may be the best option ? ?2. Hepatocellular carcinoma ([place]) ?Please see discussion above ? ?3. Partial small bowel obstruction ([place]) ?Abdomen is soft soft diet recommended plenty of fluids recommended if complete obstruction occur go to the emergency ER right away ? ?Follow-up here in 2 to 3 weeks to see how he is doing ? ?Sore throat could well be due to nasogastric tube but it is also possible could be related to yeast we will go ahead with nystatin just in case ? ? ?Patient is leaning toward palliative care will touch base with oncology ?

## 2021-06-19 ENCOUNTER — Encounter: Payer: Self-pay | Admitting: Surgery

## 2021-06-19 ENCOUNTER — Ambulatory Visit (INDEPENDENT_AMBULATORY_CARE_PROVIDER_SITE_OTHER): Payer: Medicare Other | Admitting: Surgery

## 2021-06-19 VITALS — BP 86/61 | HR 86 | Temp 98.4°F | Resp 12 | Ht 68.0 in | Wt 183.0 lb

## 2021-06-19 DIAGNOSIS — C786 Secondary malignant neoplasm of retroperitoneum and peritoneum: Secondary | ICD-10-CM

## 2021-06-19 DIAGNOSIS — K56609 Unspecified intestinal obstruction, unspecified as to partial versus complete obstruction: Secondary | ICD-10-CM | POA: Diagnosis not present

## 2021-06-19 NOTE — Patient Instructions (Addendum)
-  Continue taking over the counter Prilosec -Take Advil 400 mg every 6 hours for the next 3-5 days. MUST TAKE WITH FOOD -GI office should be calling to schedule appointment with Dr. Abbey Chatters -Follow up with me as needed

## 2021-06-20 NOTE — Progress Notes (Signed)
Logan Alvarez   HPI:  66 y.o. Male presents to clinic for follow-up after hospitalization for a small bowel obstruction secondary to peritoneal carcinomatosis.  Since being at home, he is complaining of a epigastric/chest pain with swallowing.  He is having decreased appetite as well.  He denies any nausea or vomiting.  He is passing flatus, though he has not had a whole lot of bowel movement since leaving the hospital.  He denies fevers and chills.  He denies any of the symptoms that brought him to the hospital initially.  He does have a history of esophageal stricture that required dilation with Dr. Abbey Chatters on 07/30/20.  He had not undergone any additional EGDs with dilation, as he was undergoing immunotherapy.  Review of Systems:  All other review of systems: otherwise negative   Vital Signs:  BP (!) 86/61   Pulse 86   Temp 98.4 F (36.9 C) (Oral)   Resp 12   Ht '5\' 8"'$  (1.727 m)   Wt 183 lb (83 kg)   SpO2 96%   BMI 27.83 kg/m    Physical Exam:  Physical Exam Vitals reviewed.  Constitutional:      Appearance: Normal appearance.  Abdominal:     Comments: Abdomen soft, nondistended, no percussion tenderness, nontender to palpation; no rigidity, guarding, rebound tenderness  Neurological:     Mental Status: He is alert.    Laboratory studies: None   Imaging:  None   Assessment:  66 y.o. yo Male who presents for follow-up status post admission to the hospital for small bowel obstruction secondary to peritoneal carcinomatosis, resolved with conservative treatment  Plan:  -I explained that his symptoms are likely related to a recurrent esophageal stricture -Advised him to take scheduled Motrin with food for 3 to 5 days to see if this alleviates some of his pain -Patient scheduled to see Dr. Delton Coombes on 5/22, at which time they will determine if he is going to continue immunotherapy -We will set up follow-up with Dr. Abbey Chatters for evaluation for possible repeat  EGD -Follow up with me as needed   All of the above recommendations were discussed with the patient and patient's family, and all of patient's and family's questions were answered to their expressed satisfaction.  Logan Freer, DO [facility] [address] [place], [place] [address] [phone] (office)

## 2021-06-23 ENCOUNTER — Inpatient Hospital Stay (HOSPITAL_COMMUNITY): Payer: Medicare Other

## 2021-06-23 ENCOUNTER — Inpatient Hospital Stay (HOSPITAL_BASED_OUTPATIENT_CLINIC_OR_DEPARTMENT_OTHER): Payer: Medicare Other | Admitting: Hematology

## 2021-06-23 VITALS — BP 131/74 | HR 66 | Temp 97.3°F | Resp 18 | Ht 68.0 in | Wt 184.0 lb

## 2021-06-23 DIAGNOSIS — C220 Liver cell carcinoma: Secondary | ICD-10-CM | POA: Diagnosis not present

## 2021-06-23 DIAGNOSIS — R970 Elevated carcinoembryonic antigen [CEA]: Secondary | ICD-10-CM | POA: Diagnosis not present

## 2021-06-23 DIAGNOSIS — C7A098 Malignant carcinoid tumors of other sites: Secondary | ICD-10-CM

## 2021-06-23 DIAGNOSIS — Z808 Family history of malignant neoplasm of other organs or systems: Secondary | ICD-10-CM | POA: Diagnosis not present

## 2021-06-23 DIAGNOSIS — Z5112 Encounter for antineoplastic immunotherapy: Secondary | ICD-10-CM | POA: Diagnosis not present

## 2021-06-23 DIAGNOSIS — Z79899 Other long term (current) drug therapy: Secondary | ICD-10-CM | POA: Diagnosis not present

## 2021-06-23 DIAGNOSIS — R978 Other abnormal tumor markers: Secondary | ICD-10-CM | POA: Diagnosis not present

## 2021-06-23 DIAGNOSIS — Z5111 Encounter for antineoplastic chemotherapy: Secondary | ICD-10-CM | POA: Diagnosis not present

## 2021-06-23 DIAGNOSIS — R7401 Elevation of levels of liver transaminase levels: Secondary | ICD-10-CM | POA: Diagnosis not present

## 2021-06-23 DIAGNOSIS — M2550 Pain in unspecified joint: Secondary | ICD-10-CM | POA: Diagnosis not present

## 2021-06-23 DIAGNOSIS — K5900 Constipation, unspecified: Secondary | ICD-10-CM | POA: Diagnosis not present

## 2021-06-23 DIAGNOSIS — C259 Malignant neoplasm of pancreas, unspecified: Secondary | ICD-10-CM

## 2021-06-23 DIAGNOSIS — C786 Secondary malignant neoplasm of retroperitoneum and peritoneum: Secondary | ICD-10-CM | POA: Diagnosis not present

## 2021-06-23 DIAGNOSIS — I119 Hypertensive heart disease without heart failure: Secondary | ICD-10-CM | POA: Diagnosis not present

## 2021-06-23 LAB — COMPREHENSIVE METABOLIC PANEL
ALT: 15 U/L (ref 0–44)
AST: 15 U/L (ref 15–41)
Albumin: 3.4 g/dL — ABNORMAL LOW (ref 3.5–5.0)
Alkaline Phosphatase: 58 U/L (ref 38–126)
Anion gap: 6 (ref 5–15)
BUN: 9 mg/dL (ref 8–23)
CO2: 24 mmol/L (ref 22–32)
Calcium: 8.4 mg/dL — ABNORMAL LOW (ref 8.9–10.3)
Chloride: 103 mmol/L (ref 98–111)
Creatinine, Ser: 0.74 mg/dL (ref 0.61–1.24)
GFR, Estimated: >60 mL/min (ref >60)
Glucose, Bld: 120 mg/dL — ABNORMAL HIGH (ref 70–99)
Potassium: 3 mmol/L — ABNORMAL LOW (ref 3.5–5.1)
Sodium: 133 mmol/L — ABNORMAL LOW (ref 135–145)
Total Bilirubin: 1.5 mg/dL — ABNORMAL HIGH (ref 0.3–1.2)
Total Protein: 6.6 g/dL (ref 6.5–8.1)

## 2021-06-23 LAB — CBC WITH DIFFERENTIAL/PLATELET
Abs Immature Granulocytes: 0.07 10*3/uL (ref 0.00–0.07)
Basophils Absolute: 0.1 10*3/uL (ref 0.0–0.1)
Basophils Relative: 1 %
Eosinophils Absolute: 0.1 10*3/uL (ref 0.0–0.5)
Eosinophils Relative: 1 %
HCT: 44 % (ref 39.0–52.0)
Hemoglobin: 15.2 g/dL (ref 13.0–17.0)
Immature Granulocytes: 1 %
Lymphocytes Relative: 9 %
Lymphs Abs: 1 10*3/uL (ref 0.7–4.0)
MCH: 31.7 pg (ref 26.0–34.0)
MCHC: 34.5 g/dL (ref 30.0–36.0)
MCV: 91.9 fL (ref 80.0–100.0)
Monocytes Absolute: 0.9 10*3/uL (ref 0.1–1.0)
Monocytes Relative: 7 %
Neutro Abs: 9.8 10*3/uL — ABNORMAL HIGH (ref 1.7–7.7)
Neutrophils Relative %: 81 %
Platelets: 318 10*3/uL (ref 150–400)
RBC: 4.79 MIL/uL (ref 4.22–5.81)
RDW: 14 % (ref 11.5–15.5)
WBC: 11.9 10*3/uL — ABNORMAL HIGH (ref 4.0–10.5)
nRBC: 0 % (ref 0.0–0.2)

## 2021-06-23 LAB — MAGNESIUM: Magnesium: 2.1 mg/dL (ref 1.7–2.4)

## 2021-06-23 LAB — TSH: TSH: 9.519 u[IU]/mL — ABNORMAL HIGH (ref 0.350–4.500)

## 2021-06-23 NOTE — Progress Notes (Signed)
Logan Alvarez, Logan Alvarez [id number]   CLINIC:  Medical Oncology/Hematology  PCP:  Logan Drown, MD [address] [place] / [place] [address] [phone]   REASON FOR VISIT:  Follow-up for hepatocellular carcinoma with peritoneal carcinomatosis  PRIOR THERAPY: none  NGS Results: not done  CURRENT THERAPY: Atezolizumab + Bevacizumab q21d Maintenance  BRIEF ONCOLOGIC HISTORY:  Oncology History  Hepatocellular carcinoma ([place])  11/12/2017 Initial Diagnosis   Hepatocellular carcinoma ([place])    07/29/2020 Cancer Staging   Staging form: Liver, AJCC 8th Edition - Clinical stage from 07/29/2020: Stage IVB (cT1a, cN0, pM1) - Signed by Orson Slick, MD on 07/29/2020 Stage prefix: Initial diagnosis Histologic grade (G): G3 Histologic grading system: 4 grade system    08/15/2020 -  Chemotherapy   Patient is on Treatment Plan : [place] Atezolizumab + Bevacizumab q21d Maintenance        CANCER STAGING:  Cancer Staging  Hepatocellular carcinoma ([place]) Staging form: Liver, AJCC 8th Edition - Clinical stage from 07/29/2020: Stage IVB (cT1a, cN0, pM1) - Signed by Orson Slick, MD on 07/29/2020   INTERVAL HISTORY:  Logan Alvarez, a 66 y.o. male, returns for routine follow-up and consideration for next cycle of chemotherapy. Logan Alvarez was last seen on 05/20//2023.  Due for cycle #14 of Atezolizumab + Bevacizumab today.   Overall, he tells me he has been feeling pretty well. He reports soreness when swallowing. Sweet foods irritate his throat, and he reports he has been eating soft foods. He denies current bleeding and diarrhea. He reports regular BM over the past 3 days. He is taking 1/2 tablet of oxycodone prn, and he denies requiring this tablet daily. He is taking 10 mg prednisone daily which continues to help his joint pains. His appetite is good. Swallowing cold liquids causes burning in his throat. He is taking Advil TID.   Overall, he  does not feel ready for next cycle of chemo today.   REVIEW OF SYSTEMS:  Review of Systems  Constitutional:  Negative for appetite change and fatigue.  HENT:   Positive for trouble swallowing. Negative for nosebleeds.   Respiratory:  Negative for hemoptysis.   Gastrointestinal:  Negative for blood in stool, constipation and diarrhea.  Genitourinary:  Negative for hematuria.   Musculoskeletal:  Negative for arthralgias (improved).  Psychiatric/Behavioral:  Positive for depression and sleep disturbance. The patient is nervous/anxious.   All other systems reviewed and are negative.  PAST MEDICAL/SURGICAL HISTORY:  Past Medical History:  Diagnosis Date   CAD (coronary artery disease)    a. 01/2013: cath showing nonobstructive disease. b. 11/2020: NSTEMI with DES to mid-LCx and medical management recommended of distal LCx disease given small vessel size   Hepatocellular carcinoma ([place])    Stage IV   Hyperlipidemia    Hypertension    Testosterone deficiency 2009   Past Surgical History:  Procedure Laterality Date   BALLOON DILATION  07/30/2020   Procedure: BALLOON DILATION;  Surgeon: Eloise Harman, DO;  [facility];  Service: Endoscopy;;   COLONOSCOPY N/A 03/12/2017   Procedure: COLONOSCOPY;  Surgeon: Danie Binder, MD;  [facility];  Service: Endoscopy;  Laterality: N/A;  2:00   COLONOSCOPY WITH PROPOFOL N/A 06/18/2020   Procedure: COLONOSCOPY WITH PROPOFOL;  Surgeon: Eloise Harman, DO;  [facility];  Service: Endoscopy;  Laterality: N/A;  pm appt   CORONARY STENT INTERVENTION N/A 12/03/2020   Procedure:  CORONARY STENT INTERVENTION;  Surgeon: Jettie Booze, MD;  Location: [facility];  Service: Cardiovascular;  Laterality: N/A;   ESOPHAGOGASTRODUODENOSCOPY (EGD) WITH PROPOFOL N/A 07/30/2020   Procedure: ESOPHAGOGASTRODUODENOSCOPY (EGD) WITH PROPOFOL;  Surgeon: Eloise Harman, DO;  [facility];  Service: Endoscopy;   Laterality: N/A;  11:45am   LEFT HEART CATH AND CORONARY ANGIOGRAPHY N/A 12/03/2020   Procedure: LEFT HEART CATH AND CORONARY ANGIOGRAPHY;  Surgeon: Jettie Booze, MD;  Location: [facility];  Service: Cardiovascular;  Laterality: N/A;   LEFT HEART CATHETERIZATION WITH CORONARY ANGIOGRAM N/A 02/01/2013   Procedure: LEFT HEART CATHETERIZATION WITH CORONARY ANGIOGRAM;  Surgeon: Josue Hector, MD;  Location: [facility];  Service: Cardiovascular;  Laterality: N/A;   NECK SURGERY     POLYPECTOMY  06/18/2020   Procedure: POLYPECTOMY;  Surgeon: Eloise Harman, DO;  [facility];  Service: Endoscopy;;  snare and biospy    SOCIAL HISTORY:  Social History   Socioeconomic History   Marital status: Widowed    Spouse name: Not on file   Number of children: Not on file   Years of education: Not on file   Highest education level: Not on file  Occupational History   Not on file  Tobacco Use   Smoking status: Never   Smokeless tobacco: Never  Vaping Use   Vaping Use: Never used  Substance and Sexual Activity   Alcohol use: Not Currently    Comment: 4-6 beers a day   Drug use: No   Sexual activity: Not on file  Other Topics Concern   Not on file  Social History Narrative   Not on file   Social Determinants of Health   Financial Resource Strain: Not on file  Food Insecurity: Not on file  Transportation Needs: Not on file  Physical Activity: Not on file  Stress: Not on file  Social Connections: Not on file  Intimate Partner Violence: Not on file    FAMILY HISTORY:  Family History  Problem Relation Age of Onset   Heart attack Father        91's    CURRENT MEDICATIONS:  Current Outpatient Medications  Medication Sig Dispense Refill   ALPRAZolam (XANAX) 0.5 MG tablet Take one tablet po QID 120 tablet 4   aspirin EC 81 MG tablet Take 1 tablet (81 mg total) by mouth daily. Swallow whole. 30 tablet 11   clopidogrel (PLAVIX) 75 MG tablet Take 1 tablet (75  mg total) by mouth daily. 90 tablet 3   docusate sodium (COLACE) 100 MG capsule Take 1 capsule (100 mg total) by mouth 2 (two) times daily. 10 capsule 0   hydrocortisone (ANUSOL-HC) 2.5 % rectal cream Place 1 application. rectally 2 (two) times daily. 30 g 5   losartan (COZAAR) 25 MG tablet Take 1 tablet (25 mg total) by mouth in the morning. 90 tablet 1   metoprolol tartrate (LOPRESSOR) 25 MG tablet Take 0.5 tablets (12.5 mg total) by mouth 2 (two) times daily. 30 tablet 5   nitroGLYCERIN (NITROSTAT) 0.4 MG SL tablet Place 1 tablet (0.4 mg total) under the tongue every 5 (five) minutes as needed for chest pain. 25 tablet 2   nystatin (MYCOSTATIN) 100000 UNIT/ML suspension Take 5 mLs (500,000 Units total) by mouth 4 (four) times daily. For 7 days 140 mL 0   oxyCODONE (OXY IR/ROXICODONE) 5 MG immediate release tablet Take 1 tablet (5 mg total) by mouth every 4 (four) hours as  needed (pain). 30 tablet 0   predniSONE (DELTASONE) 20 MG tablet Take 0.5 tablets (10 mg total) by mouth daily with breakfast. 30 tablet 1   prochlorperazine (COMPAZINE) 10 MG tablet Take 1 tablet (10 mg total) by mouth every 6 (six) hours as needed for nausea or vomiting. 30 tablet 3   sodium chloride (OCEAN) 0.65 % nasal spray 1 spray as needed for congestion.     No current facility-administered medications for this visit.    ALLERGIES:  Allergies  Allergen Reactions   Cymbalta [Duloxetine Hcl]     Bad dreams   Lodine [Etodolac] Nausea And Vomiting   Zoloft [Sertraline Hcl]     REDUCED URINARY FLOW,NOCTURIA    PHYSICAL EXAM:  Performance status (ECOG): 1 - Symptomatic but completely ambulatory  There were no vitals filed for this visit. Wt Readings from Last 3 Encounters:  06/19/21 183 lb (83 kg)  06/17/21 184 lb 9.6 oz (83.7 kg)  06/11/21 180 lb (81.6 kg)   Physical Exam Vitals reviewed.  Constitutional:      Appearance: Normal appearance.  Cardiovascular:     Rate and Rhythm: Normal rate and regular  rhythm.     Pulses: Normal pulses.     Heart sounds: Normal heart sounds.  Pulmonary:     Effort: Pulmonary effort is normal.     Breath sounds: Normal breath sounds.  Abdominal:     Palpations: Abdomen is soft. There is no hepatomegaly, splenomegaly or mass.     Tenderness: There is no abdominal tenderness.  Musculoskeletal:     Right lower leg: No edema.     Left lower leg: No edema.  Neurological:     General: No focal deficit present.     Mental Status: He is alert and oriented to person, place, and time.  Psychiatric:        Mood and Affect: Mood normal.        Behavior: Behavior normal.    LABORATORY DATA:  I have reviewed the labs as listed.     Latest Ref Rng & Units 06/15/2021    4:57 AM 06/14/2021    5:24 AM 06/13/2021    5:28 AM  CBC  WBC 4.0 - 10.5 K/uL 6.8   10.4   14.9    Hemoglobin 13.0 - 17.0 g/dL 14.5   13.8   13.9    Hematocrit 39.0 - 52.0 % 41.9   40.1   41.1    Platelets 150 - 400 K/uL 178   178   232        Latest Ref Rng & Units 06/15/2021    4:57 AM 06/14/2021    5:24 AM 06/13/2021    5:28 AM  CMP  Glucose 70 - 99 mg/dL 96   99   96    BUN 8 - 23 mg/dL '10   13   13    '$ Creatinine 0.61 - 1.24 mg/dL 0.63   0.64   0.58    Sodium 135 - 145 mmol/L 139   135   136    Potassium 3.5 - 5.1 mmol/L 4.4   4.0   4.0    Chloride 98 - 111 mmol/L 105   103   101    CO2 22 - 32 mmol/L '30   28   30    '$ Calcium 8.9 - 10.3 mg/dL 8.9   8.7   9.2    Total Protein 6.5 - 8.1 g/dL 6.1   5.6   5.9  Total Bilirubin 0.3 - 1.2 mg/dL 1.3   1.6   1.7    Alkaline Phos 38 - 126 U/L 57   54   58    AST 15 - 41 U/L '21   17   20    '$ ALT 0 - 44 U/L '18   21   25      '$ DIAGNOSTIC IMAGING:  I have independently reviewed the scans and discussed with the patient. DG Abd 1 View  Result Date: 06/13/2021 CLINICAL DATA:  Small-bowel obstruction EXAM: ABDOMEN - 1 VIEW COMPARISON:  Radiograph dated Jun 11, 2021 FINDINGS: Enteric tube tip is positioned near the GE junction. Persistent dilated  loops of small bowel. IMPRESSION: 1. Enteric tube tip is positioned near the GE junction, recommend advancement. 2. Persistent air-filled dilated loops of small bowel, compatible with small bowel obstruction. Electronically Signed   By: Yetta Glassman M.D.   On: 06/13/2021 11:00   DG Abdomen 1 View  Result Date: 06/11/2021 CLINICAL DATA:  Nasogastric tube placement. EXAM: ABDOMEN - 1 VIEW COMPARISON:  None Available. FINDINGS: The bowel gas pattern is normal. Distal tip of nasogastric tube is seen in proximal stomach just beyond gastroesophageal junction. No radio-opaque calculi or other significant radiographic abnormality are seen. IMPRESSION: Distal tip of nasogastric tube seen in proximal stomach just beyond expected position of gastroesophageal junction. Advancement is recommended. Electronically Signed   By: Marijo Conception M.D.   On: 06/11/2021 20:49   CT Abdomen Pelvis W Contrast  Result Date: 06/11/2021 CLINICAL DATA:  Abdominal pain. Bowel obstruction suspected. History of hepatocellular carcinoma with peritoneal carcinomatosis. History of partial hepatectomy. EXAM: CT ABDOMEN AND PELVIS WITH CONTRAST TECHNIQUE: Multidetector CT imaging of the abdomen and pelvis was performed using the standard protocol following bolus administration of intravenous contrast. RADIATION DOSE REDUCTION: This exam was performed according to the departmental dose-optimization program which includes automated exposure control, adjustment of the mA and/or kV according to patient size and/or use of iterative reconstruction technique. CONTRAST:  116m OMNIPAQUE IOHEXOL 300 MG/ML  SOLN COMPARISON:  CT chest abdomen pelvis 05/22/2021 FINDINGS: Lower chest: Lung bases are clear.  No pleural effusions. Hepatobiliary: Subcentimeter hypodensities in the liver are again noted. History of partial hepatectomy involving segments 5 and 6. Main portal venous system is patent. No significant biliary dilatation. Gallbladder appears to  be surgically absent. Liver has a slightly nodular contour and compatible with underlying cirrhosis. Pancreas: Unremarkable. No pancreatic ductal dilatation or surrounding inflammatory changes. Spleen: Normal in size without focal abnormality. Adrenals/Urinary Tract: Normal adrenal glands. Again noted is a low-density structure in the right kidney lower pole that is suggestive for a cyst and does not require dedicated follow-up. No suspicious renal lesions. Negative for hydronephrosis. Mild distention of the urinary bladder. Stomach/Bowel: Small hiatal hernia. Stomach is mildly distended with fluid. Multiple fluid-filled loops of small bowel abdomen measuring up to 3.5 cm in diameter. Distal small bowel is decompressed. Findings are suggestive for a small bowel obstruction. Transition point is likely in the right abdomen near sequence 2 image 54. In addition, there may be distended or fluid-filled appendix which is similar to the prior examination. Vascular/Lymphatic: Atherosclerotic calcifications in the aorta and iliac arteries without aneurysm. Main visceral arteries are patent. No significant lymph node enlargement in the abdomen or pelvis. Reproductive: Prostate is unremarkable. Other: Again noted is abnormal soft tissue involving the anterior peritoneum and omentum. There is also soft tissue thickening or fullness along the right abdominal mesentery which is probably  near the small bowel transition point. Findings are compatible with peritoneal carcinomatosis. Small amount of ascites in the abdomen which is new. Negative for free air. Musculoskeletal: Stable sclerosis involving the left iliac wing. No acute bone abnormality. Stable compression deformity involving the L1 vertebral body. No acute bone abnormality. IMPRESSION: 1. Dilated loops of small bowel are most compatible with a small bowel obstruction. Distal small bowel is decompressed and transition point is not clearly identified but probably in the  right abdomen associated with mesenteric thickening and peritoneal disease. 2. Again noted is abnormal peritoneal and mesenteric soft tissue that is compatible with peritoneal carcinomatosis. Small amount of ascites is new. 3.  Aortic Atherosclerosis (ICD10-I70.0). 4. Cirrhosis. Electronically Signed   By: Markus Daft M.D.   On: 06/11/2021 18:04     ASSESSMENT:  Hepatocellular carcinoma with peritoneal carcinomatosis: - Liver segment 5/6 partial hepatectomy on 12/08/2017-poorly differentiated hepatocellular carcinoma, 3 cm.  T1BN X- margins. - CT pancreatic protocol on [facility]/25/[facility] showed nodular studding and thickening of the ventral mesenteric fat suspicious for peritoneal carcinomatosis.  New pancreatic body lesion with pancreatic duct dilatation as well as right lateral abdominal wall deposit.  Found to have elevated CEA and CA 19-9. - Ultrasound-guided FNA of the right lateral abdominal wall soft tissue lesion on 06/26/2020. - Pathology consistent with metastatic HCC. - Evaluated by Dr. Lorenso Courier and started on Atezolizumab and bevacizumab on 08/15/2020. - Restaging scan on 12/05/2020 with persistent omental thickening with evidence of peritoneal metastatic disease, minimally changed since 10/04/2020.  Persistent superficial nodule along the right lateral abdomen. - We have reviewed CT of the abdomen from 01/23/2021. - There is interval progression in the hazy/nodular soft tissue thickening throughout the small bowel mesentery, omentum and paracolic gutters with no free fluid.  Cirrhosis, with subcentimeter low-attenuation lesions in the liver, too small to characterize.   Social/family history: - Lives at home.  Son lives with him.  He worked as an Pharmacist, hospital on disability currently.  Non-smoker.  Last drank alcohol last month. - Sister had bone cancer.   PLAN:  Metastatic HCC with peritoneal carcinomatosis: -He was hospitalized from 06/11/2021 through 06/15/2021 with small  bowel obstruction. - I have reviewed hospital records. - He was managed conservatively and is doing relatively well. - He is having bowel movements at home and denies any vomiting. - Reviewed labs today which showed normal LFTs and CBC.  TSH was 9.5. - I will hold off on treatment today.  If he is feeling well, we will give immunotherapy next week.  We have also discussed that this condition (SBO) may likely recur. - He is reporting some epigastric burning pain on eating certain foods like sweets and drinking beer.  He is able to eat sushi well without any problem.  He reportedly had esophagus stretched few years ago.  Dr. Abbey Chatters is seeing him in June for EGD. - I have recommended using Maalox/Mylanta.  If it does not help, Carafate will be prescribed. - We will plan to treat him with Keytruda next week and resume bevacizumab after EGD and possible stretching.  RTC 4 weeks for follow-up.  2.  Constipation: -Continue Colace 2 tablets daily.  Use MiraLAX as needed.  3.  Joint pain/myalgias: -Continue prednisone 10 mg daily which is helping.  4.  Transaminitis: -LFTs are normal today.   Orders placed this encounter:  No orders of the defined types were placed in this encounter.    Logan Jack, MD [facility] [phone]   I, Thana Ates, am acting as a Education administrator for Dr. Derek Alvarez.  I, Logan Jack MD, have reviewed the above documentation for accuracy and completeness, and I agree with the above.

## 2021-06-23 NOTE — Progress Notes (Unsigned)
GI Office Note    Referring Provider: Kathyrn Drown, MD Primary Care Physician:  Kathyrn Drown, MD Primary GI: Dr. Abbey Chatters  Date:  06/24/2021  ID:  BRAYLEE BOSHER, DOB [birth year], MRN [id number]   Chief Complaint   Chief Complaint  Patient presents with   Dysphagia    Food getting stuck in throat      History of Present Illness  REGINALDO HAZARD is a 66 y.o. male with a history of hepatocellular carcinoma s/p resection now with recurrence followed by oncology, HLD, HTN, CAD s/p stent placement in Nov 2022, and severe Schatzki's ring presenting today with complaint of throat pain and worsening dysphagia.  Last EGD June 2022 for variceal screening, found to have severe Schatzki's ring dilated with 12 mm balloon.  He was recommended to have repeat EGD however patient did not follow-up.  Prior liver biopsies without fibrosis, follows with [facility] regularly for [facility].  Last colonoscopy May 2022 -nonbleeding internal hemorrhoids, single 6 mm sessile polyp found in the descending colon, 2 sessile polyps about 2 mm in size in the descending colon, few small mouth diverticula found in the transverse colon (path revealing 1 tubular adenoma and 2 polypoid colonic mucosa)  Last seen 05/08/21 by Dr. Abbey Chatters.  Patient reported that his dysphagia was okay as long as he cuts up his food into small bites, not interested in repeating EGD.  He was previously seen for rectal bleeding and discomfort found to have external hemorrhoids and was started on Anusol cream and conservative measures with MiraLAX as well as fiber and sitz bath's.  He had reported that he was taking his MiraLAX however had titrated some due to frequent loose stools, taking about half a capful daily.  He had noted improvement with Anusol cream.  He was offered referral for hemorrhoid surgery but patient reported he wanted to hold off.  He also wanted to hold off on further EGDs, recommended cutting up his food into small pieces.  Was advised to  follow-up in 6 months.   Had a NSTEMI in November - had one stent placed.    Earlier this month he was admitted to the hospital with small bowel obstruction.  CT scan with dilated loops of small bowel compatible with bowel obstruction.  Distal small bowel decompressed and transition point not clearly identified but probably in the right abdomen associated with mesenteric thickening and peritoneal disease.  This is consistent with peritoneal carcinomatosis.  Also evidence of cirrhosis.   Today:  Reports soft foods and cold liquids is burning his upper abdomen lower sternum area and any acidic type foods are hurting him. Has been drinking room temperature water. Reports he ate scrambled eggs this morning and has to chew it up very well and feels like it is hanging up and then can get it to go down after drinking sips of water. Sometimes he states it behaves and he went to a sushi bar and he ate that without any trouble. He can eat pudding but can do small portions. Reports his throat does not like chocolate.  He states he does have occasional burning in his lower chest/epigastric region.  He states he has been taking Maalox or Mylanta with some improvement in the burning.  Reports he had a bowel blockage about 2 weeks ago and had an NGT that irritated him a lot. Reports he was told it would take a couple of weeks for his esophagus to heal after the NGT was there. Has  been having bowel movements since his blockage - did not require surgery.   Has lost about 6 lbs in the last couple of weeks.   Past Medical History:  Diagnosis Date   CAD (coronary artery disease)    a. 01/2013: cath showing nonobstructive disease. b. 11/2020: NSTEMI with DES to mid-LCx and medical management recommended of distal LCx disease given small vessel size   Hepatocellular carcinoma (Ages)    Stage IV   Hyperlipidemia    Hypertension    Testosterone deficiency 2009    Past Surgical History:  Procedure Laterality Date    BALLOON DILATION  07/30/2020   Procedure: BALLOON DILATION;  Surgeon: Eloise Harman, DO;  [facility];  Service: Endoscopy;;   COLONOSCOPY N/A 03/12/2017   Procedure: COLONOSCOPY;  Surgeon: Danie Binder, MD;  [facility];  Service: Endoscopy;  Laterality: N/A;  2:00   COLONOSCOPY WITH PROPOFOL N/A 06/18/2020   Procedure: COLONOSCOPY WITH PROPOFOL;  Surgeon: Eloise Harman, DO;  [facility];  Service: Endoscopy;  Laterality: N/A;  pm appt   CORONARY STENT INTERVENTION N/A 12/03/2020   Procedure: CORONARY STENT INTERVENTION;  Surgeon: Jettie Booze, MD;  Location: [place] [facility];  Service: Cardiovascular;  Laterality: N/A;   ESOPHAGOGASTRODUODENOSCOPY (EGD) WITH PROPOFOL N/A 07/30/2020   Procedure: ESOPHAGOGASTRODUODENOSCOPY (EGD) WITH PROPOFOL;  Surgeon: Eloise Harman, DO;  [facility];  Service: Endoscopy;  Laterality: N/A;  11:45am   LEFT HEART CATH AND CORONARY ANGIOGRAPHY N/A 12/03/2020   Procedure: LEFT HEART CATH AND CORONARY ANGIOGRAPHY;  Surgeon: Jettie Booze, MD;  Location: [place] [facility];  Service: Cardiovascular;  Laterality: N/A;   LEFT HEART CATHETERIZATION WITH CORONARY ANGIOGRAM N/A 02/01/2013   Procedure: LEFT HEART CATHETERIZATION WITH CORONARY ANGIOGRAM;  Surgeon: Josue Hector, MD;  Location: [facility] CATH LAB;  Service: Cardiovascular;  Laterality: N/A;   NECK SURGERY     POLYPECTOMY  06/18/2020   Procedure: POLYPECTOMY;  Surgeon: Eloise Harman, DO;  [facility];  Service: Endoscopy;;  snare and biospy    Current Outpatient Medications  Medication Sig Dispense Refill   ALPRAZolam (XANAX) 0.5 MG tablet Take one tablet po QID 120 tablet 4   aspirin EC 81 MG tablet Take 1 tablet (81 mg total) by mouth daily. Swallow whole. 30 tablet 11   clopidogrel (PLAVIX) 75 MG tablet Take 1 tablet (75 mg total) by mouth daily. 90 tablet 3   docusate sodium (COLACE) 100 MG capsule Take 1 capsule (100  mg total) by mouth 2 (two) times daily. 10 capsule 0   losartan (COZAAR) 25 MG tablet Take 1 tablet (25 mg total) by mouth in the morning. 90 tablet 1   metoprolol tartrate (LOPRESSOR) 25 MG tablet Take 0.5 tablets (12.5 mg total) by mouth 2 (two) times daily. 30 tablet 5   nystatin (MYCOSTATIN) 100000 UNIT/ML suspension Take 5 mLs (500,000 Units total) by mouth 4 (four) times daily. For 7 days 140 mL 0   oxyCODONE (OXY IR/ROXICODONE) 5 MG immediate release tablet Take 1 tablet (5 mg total) by mouth every 4 (four) hours as needed (pain). 30 tablet 0   predniSONE (DELTASONE) 20 MG tablet Take 0.5 tablets (10 mg total) by mouth daily with breakfast. 30 tablet 1   prochlorperazine (COMPAZINE) 10 MG tablet Take 1 tablet (10 mg total) by mouth every 6 (six) hours as needed for nausea or vomiting. 30 tablet 3   hydrocortisone (ANUSOL-HC) 2.5 %  rectal cream Place 1 application. rectally 2 (two) times daily. (Patient not taking: Reported on 06/24/2021) 30 g 5   nitroGLYCERIN (NITROSTAT) 0.4 MG SL tablet Place 1 tablet (0.4 mg total) under the tongue every 5 (five) minutes as needed for chest pain. (Patient not taking: Reported on 06/23/2021) 25 tablet 2   sodium chloride (OCEAN) 0.65 % nasal spray 1 spray as needed for congestion. (Patient not taking: Reported on 06/24/2021)     No current facility-administered medications for this visit.    Allergies as of 06/24/2021 - Review Complete 06/24/2021  Allergen Reaction Noted   Cymbalta [duloxetine hcl]  08/28/2016   Lodine [etodolac] Nausea And Vomiting 05/20/2012   Zoloft [sertraline hcl]  01/29/2017    Family History  Problem Relation Age of Onset   Heart attack Father        53's    Social History   Socioeconomic History   Marital status: Widowed    Spouse name: Not on file   Number of children: Not on file   Years of education: Not on file   Highest education level: Not on file  Occupational History   Not on file  Tobacco Use   Smoking  status: Never   Smokeless tobacco: Never  Vaping Use   Vaping Use: Never used  Substance and Sexual Activity   Alcohol use: Not Currently    Comment: 4-6 beers a day   Drug use: No   Sexual activity: Not on file  Other Topics Concern   Not on file  Social History Narrative   Not on file   Social Determinants of Health   Financial Resource Strain: Not on file  Food Insecurity: Not on file  Transportation Needs: Not on file  Physical Activity: Not on file  Stress: Not on file  Social Connections: Not on file     Review of Systems   Gen: Denies fever, chills, anorexia. Denies fatigue, weakness, weight loss.  CV: Denies chest pain, palpitations, syncope, peripheral edema, and claudication. Resp: Denies dyspnea at rest, cough, wheezing, coughing up blood, and pleurisy. GI: See HPI Derm: Denies rash, itching, dry skin Psych: Denies depression, anxiety, memory loss, confusion. No homicidal or suicidal ideation.  Heme: Denies bruising, bleeding, and enlarged lymph nodes.   Physical Exam   BP 138/72   Pulse 68   Temp 97.6 F (36.4 C) (Temporal)   Ht '5\' 8"'$  (1.727 m)   Wt 184 lb (83.5 kg)   BMI 27.98 kg/m   General:   Alert and oriented. No distress noted. Pleasant and cooperative.  Head:  Normocephalic and atraumatic. Eyes:  Conjuctiva clear without scleral icterus. Mouth:  Oral mucosa pink and moist. Good dentition. No lesions. Lungs:  Clear to auscultation bilaterally. No wheezes, rales, or rhonchi. No distress.  Heart:  S1, S2 present without murmurs appreciated.  Abdomen:  +BS, soft, mild epigastric tenderness, non-distended. No rebound or guarding. No HSM or masses noted. Rectal: deferred Msk:  Symmetrical without gross deformities. Normal posture. Extremities:  Without edema. Neurologic:  Alert and  oriented x4 Psych:  Alert and cooperative. Normal mood and affect.   Assessment  MANDO BLATZ is a 66 y.o. male with a history of HTN, HLD, hepatocellular  carcinomas/p resection now with recurrence followed by oncology, and CAD with NSTEMI s/p stent placement in November 2022 presenting today with worsening dysphagia and throat pain.  Esophageal dysphagia/GERD: Patient was last seen last month by Dr. Abbey Chatters and at the time he was able  to control his dysphagia with a soft diet and adequate chewing of his food along with alternating sips of liquids.  He reportedly was admitted to the hospital earlier this month with small bowel obstruction and had NG tube placed for couple of days.  He reported that he was told that he could have some soreness for couple weeks but he continues to have irritation and worsening dysphagia.  He reports that most days even with soft foods he continues to have a feeling like food is getting stuck and burning in his lower chest.  He also reports that cold drinks are particularly bothersome.  He has reported about a 6 pound weight loss in the last 2 weeks.  He has been seeing Dr. Delton Coombes for his recurrence of [facility], last visit yesterday 5/22 who also recommended to continue to use Maalox or Mylanta for the burning pain that he feels in his throat and can consider Carafate if this becomes not helpful.  He gave blessing to perform EGD with dilation and then he will resume his [facility] treatment.  We will proceed with EGD with dilation with Dr. Abbey Chatters in the near future, will need cardiac clearance to hold Plavix for 5 days given NSTEMI about 6 months prior.  We will try to expedite timing of procedure.  Recommended dietary supplementation with protein shakes 1-2 a day to help maintain weight.  History of small bowel obstruction: Admitted from 5/10 to 5/14 for small bowel obstruction.  He had an NG tube for for a few days.  CT evidence of small bowel obstruction.  He had general surgery consultation who suggested medical management and stated he would be high risk for recurrence given peritoneal carcinomatosis.  He denies any constipation or  diarrhea since discharge from the hospital.  He will continue to follow with oncology and resume treatment once EGD with dilation performed  Hepatocellular carcinoma: Following with Dr. Delton Coombes due to recurrence.  CT pancreatic protocol 06/25/2020 at [facility] showed nodular studding and thickening of the ventral mesenteric fat suspicious for peritoneal carcinomatosis, new pancreatic body lesion with pancreatic ductal dilation as well as right lateral abdominal wall deposit.  Has elevated CEA and CA 19-9.  He will receive Keytruda next week and resume bevacizumab after EGD with dilation.  He will have follow-up in 4 weeks.   PLAN   Proceed with upper endoscopy +/- dilation with propofol by Dr. Abbey Chatters in near future: the risks, benefits, and alternatives have been discussed with the patient in detail. The patient states understanding and desires to proceed. ASA 3 Plavix hold for 5 days prior to procedure - Cardiac clearance needed Encourage protein shakes 1-2 a day to help maintain weight Continue with soft diet alternating bites and sips of liquids. Continue Maalox or Mylanta as needed for reflux, can consider Carafate in the future if either of these become unhelpful. We will determine follow-up after procedure.    Venetia Night, MSN, FNP-BC, AGACNP-BC [facility]

## 2021-06-23 NOTE — Patient Instructions (Addendum)
[place] at [facility] Discharge Instructions  You were seen and examined today by Dr. Delton Coombes.  Dr. Delton Coombes discussed your most recent hospitalization. The bowel obstruction is likely related to your cancer and that it can happen again. Due to the location of your cancer, it can block your bowels from being able to move appropriately.  You may proceed with your treatment next week to give you an additional week to recover.  Follow-up as scheduled.    Thank you for choosing [place] at [facility] to provide your oncology and hematology care.  To afford each patient quality time with our provider, please arrive at least 15 minutes before your scheduled appointment time.   If you have a lab appointment with the [facility] please come in thru the Main Entrance and check in at the main information desk.  You need to re-schedule your appointment should you arrive 10 or more minutes late.  We strive to give you quality time with our providers, and arriving late affects you and other patients whose appointments are after yours.  Also, if you no show three or more times for appointments you may be dismissed from the clinic at the providers discretion.     Again, thank you for choosing [facility].  Our hope is that these requests will decrease the amount of time that you wait before being seen by our physicians.       _____________________________________________________________  Should you have questions after your visit to [facility], please contact our office at [phone] and follow the prompts.  Our office hours are 8:00 a.m. and 4:30 p.m. Monday - Friday.  Please note that voicemails left after 4:00 p.m. may not be returned until the following business day.  We are closed weekends and major holidays.  You do have access to a nurse 24-7, just call the main number to the clinic [phone] and do not press  any options, hold on the line and a nurse will answer the phone.    For prescription refill requests, have your pharmacy contact our office and allow 72 hours.    Due to Covid, you will need to wear a mask upon entering the hospital. If you do not have a mask, a mask will be given to you at the Main Entrance upon arrival. For doctor visits, patients may have 1 support person age 67 or older with them. For treatment visits, patients can not have anyone with them due to social distancing guidelines and our immunocompromised population.

## 2021-06-23 NOTE — H&P (View-Only) (Signed)
GI Office Note    Referring Provider: Kathyrn Drown, MD Primary Care Physician:  Kathyrn Drown, MD Primary GI: Dr. Abbey Chatters  Date:  06/24/2021  ID:  Logan Alvarez, DOB 1955/07/17, MRN [id number]   Chief Complaint   Chief Complaint  Patient presents with   Dysphagia    Food getting stuck in throat      History of Present Illness  Logan Alvarez is a 66 y.o. male with a history of hepatocellular carcinoma s/p resection now with recurrence followed by oncology, HLD, HTN, CAD s/p stent placement in Nov 2022, and severe Schatzki's ring presenting today with complaint of throat pain and worsening dysphagia.  Last EGD June 2022 for variceal screening, found to have severe Schatzki's ring dilated with 12 mm balloon.  He was recommended to have repeat EGD however patient did not follow-up.  Prior liver biopsies without fibrosis, follows with [facility] regularly for [facility].  Last colonoscopy May 2022 -nonbleeding internal hemorrhoids, single 6 mm sessile polyp found in the descending colon, 2 sessile polyps about 2 mm in size in the descending colon, few small mouth diverticula found in the transverse colon (path revealing 1 tubular adenoma and 2 polypoid colonic mucosa)  Last seen 05/08/21 by Dr. Abbey Chatters.  Patient reported that his dysphagia was okay as long as he cuts up his food into small bites, not interested in repeating EGD.  He was previously seen for rectal bleeding and discomfort found to have external hemorrhoids and was started on Anusol cream and conservative measures with MiraLAX as well as fiber and sitz bath's.  He had reported that he was taking his MiraLAX however had titrated some due to frequent loose stools, taking about half a capful daily.  He had noted improvement with Anusol cream.  He was offered referral for hemorrhoid surgery but patient reported he wanted to hold off.  He also wanted to hold off on further EGDs, recommended cutting up his food into small pieces.  Was advised to  follow-up in 6 months.   Had a NSTEMI in November - had one stent placed.    Earlier this month he was admitted to the hospital with small bowel obstruction.  CT scan with dilated loops of small bowel compatible with bowel obstruction.  Distal small bowel decompressed and transition point not clearly identified but probably in the right abdomen associated with mesenteric thickening and peritoneal disease.  This is consistent with peritoneal carcinomatosis.  Also evidence of cirrhosis.   Today:  Reports soft foods and cold liquids is burning his upper abdomen lower sternum area and any acidic type foods are hurting him. Has been drinking room temperature water. Reports he ate scrambled eggs this morning and has to chew it up very well and feels like it is hanging up and then can get it to go down after drinking sips of water. Sometimes he states it behaves and he went to a sushi bar and he ate that without any trouble. He can eat pudding but can do small portions. Reports his throat does not like chocolate.  He states he does have occasional burning in his lower chest/epigastric region.  He states he has been taking Maalox or Mylanta with some improvement in the burning.  Reports he had a bowel blockage about 2 weeks ago and had an NGT that irritated him a lot. Reports he was told it would take a couple of weeks for his esophagus to heal after the NGT was there. Has  been having bowel movements since his blockage - did not require surgery.   Has lost about 6 lbs in the last couple of weeks.   Past Medical History:  Diagnosis Date   CAD (coronary artery disease)    a. 01/2013: cath showing nonobstructive disease. b. 11/2020: NSTEMI with DES to mid-LCx and medical management recommended of distal LCx disease given small vessel size   Hepatocellular carcinoma ([place])    Stage IV   Hyperlipidemia    Hypertension    Testosterone deficiency 2009    Past Surgical History:  Procedure Laterality Date    BALLOON DILATION  07/30/2020   Procedure: BALLOON DILATION;  Surgeon: Eloise Harman, DO;  [facility];  Service: Endoscopy;;   COLONOSCOPY N/A 03/12/2017   Procedure: COLONOSCOPY;  Surgeon: Danie Binder, MD;  [facility];  Service: Endoscopy;  Laterality: N/A;  2:00   COLONOSCOPY WITH PROPOFOL N/A 06/18/2020   Procedure: COLONOSCOPY WITH PROPOFOL;  Surgeon: Eloise Harman, DO;  [facility];  Service: Endoscopy;  Laterality: N/A;  pm appt   CORONARY STENT INTERVENTION N/A 12/03/2020   Procedure: CORONARY STENT INTERVENTION;  Surgeon: Jettie Booze, MD;  Location: [place] [facility];  Service: Cardiovascular;  Laterality: N/A;   ESOPHAGOGASTRODUODENOSCOPY (EGD) WITH PROPOFOL N/A 07/30/2020   Procedure: ESOPHAGOGASTRODUODENOSCOPY (EGD) WITH PROPOFOL;  Surgeon: Eloise Harman, DO;  [facility];  Service: Endoscopy;  Laterality: N/A;  11:45am   LEFT HEART CATH AND CORONARY ANGIOGRAPHY N/A 12/03/2020   Procedure: LEFT HEART CATH AND CORONARY ANGIOGRAPHY;  Surgeon: Jettie Booze, MD;  Location: [place] [facility];  Service: Cardiovascular;  Laterality: N/A;   LEFT HEART CATHETERIZATION WITH CORONARY ANGIOGRAM N/A 02/01/2013   Procedure: LEFT HEART CATHETERIZATION WITH CORONARY ANGIOGRAM;  Surgeon: Josue Hector, MD;  Location: [facility] C[facility] LAB;  Service: Cardiovascular;  Laterality: N/A;   NECK SURGERY     POLYPECTOMY  06/18/2020   Procedure: POLYPECTOMY;  Surgeon: Eloise Harman, DO;  [facility];  Service: Endoscopy;;  snare and biospy    Current Outpatient Medications  Medication Sig Dispense Refill   ALPRAZolam (XANAX) 0.5 MG tablet Take one tablet po QID 120 tablet 4   aspirin EC 81 MG tablet Take 1 tablet (81 mg total) by mouth daily. Swallow whole. 30 tablet 11   clopidogrel (PLAVIX) 75 MG tablet Take 1 tablet (75 mg total) by mouth daily. 90 tablet 3   docusate sodium (COLACE) 100 MG capsule Take 1 capsule (100  mg total) by mouth 2 (two) times daily. 10 capsule 0   losartan (COZAAR) 25 MG tablet Take 1 tablet (25 mg total) by mouth in the morning. 90 tablet 1   metoprolol tartrate (LOPRESSOR) 25 MG tablet Take 0.5 tablets (12.5 mg total) by mouth 2 (two) times daily. 30 tablet 5   nystatin (MYCOSTATIN) 100000 UNIT/ML suspension Take 5 mLs (500,000 Units total) by mouth 4 (four) times daily. For 7 days 140 mL 0   oxyCODONE (OXY IR/ROXICODONE) 5 MG immediate release tablet Take 1 tablet (5 mg total) by mouth every 4 (four) hours as needed (pain). 30 tablet 0   predniSONE (DELTASONE) 20 MG tablet Take 0.5 tablets (10 mg total) by mouth daily with breakfast. 30 tablet 1   prochlorperazine (COMPAZINE) 10 MG tablet Take 1 tablet (10 mg total) by mouth every 6 (six) hours as needed for nausea or vomiting. 30 tablet 3   hydrocortisone (ANUSOL-HC) 2.5 %  rectal cream Place 1 application. rectally 2 (two) times daily. (Patient not taking: Reported on 06/24/2021) 30 g 5   nitroGLYCERIN (NITROSTAT) 0.4 MG SL tablet Place 1 tablet (0.4 mg total) under the tongue every 5 (five) minutes as needed for chest pain. (Patient not taking: Reported on 06/23/2021) 25 tablet 2   sodium chloride (OCEAN) 0.65 % nasal spray 1 spray as needed for congestion. (Patient not taking: Reported on 06/24/2021)     No current facility-administered medications for this visit.    Allergies as of 06/24/2021 - Review Complete 06/24/2021  Allergen Reaction Noted   Cymbalta [duloxetine hcl]  08/28/2016   Lodine [etodolac] Nausea And Vomiting 05/20/2012   Zoloft [sertraline hcl]  01/29/2017    Family History  Problem Relation Age of Onset   Heart attack Father        66's    Social History   Socioeconomic History   Marital status: Widowed    Spouse name: Not on file   Number of children: Not on file   Years of education: Not on file   Highest education level: Not on file  Occupational History   Not on file  Tobacco Use   Smoking  status: Never   Smokeless tobacco: Never  Vaping Use   Vaping Use: Never used  Substance and Sexual Activity   Alcohol use: Not Currently    Comment: 4-6 beers a day   Drug use: No   Sexual activity: Not on file  Other Topics Concern   Not on file  Social History Narrative   Not on file   Social Determinants of Health   Financial Resource Strain: Not on file  Food Insecurity: Not on file  Transportation Needs: Not on file  Physical Activity: Not on file  Stress: Not on file  Social Connections: Not on file     Review of Systems   Gen: Denies fever, chills, anorexia. Denies fatigue, weakness, weight loss.  CV: Denies chest pain, palpitations, syncope, peripheral edema, and claudication. Resp: Denies dyspnea at rest, cough, wheezing, coughing up blood, and pleurisy. GI: See HPI Derm: Denies rash, itching, dry skin Psych: Denies depression, anxiety, memory loss, confusion. No homicidal or suicidal ideation.  Heme: Denies bruising, bleeding, and enlarged lymph nodes.   Physical Exam   BP 138/72   Pulse 68   Temp 97.6 F (36.4 C) (Temporal)   Ht '5\' 8"'$  (1.727 m)   Wt 184 lb (83.5 kg)   BMI 27.98 kg/m   General:   Alert and oriented. No distress noted. Pleasant and cooperative.  Head:  Normocephalic and atraumatic. Eyes:  Conjuctiva clear without scleral icterus. Mouth:  Oral mucosa pink and moist. Good dentition. No lesions. Lungs:  Clear to auscultation bilaterally. No wheezes, rales, or rhonchi. No distress.  Heart:  S1, S2 present without murmurs appreciated.  Abdomen:  +BS, soft, mild epigastric tenderness, non-distended. No rebound or guarding. No HSM or masses noted. Rectal: deferred Msk:  Symmetrical without gross deformities. Normal posture. Extremities:  Without edema. Neurologic:  Alert and  oriented x4 Psych:  Alert and cooperative. Normal mood and affect.   Assessment  Logan Alvarez is a 66 y.o. male with a history of HTN, HLD, hepatocellular  carcinomas/p resection now with recurrence followed by oncology, and CAD with NSTEMI s/p stent placement in November 2022 presenting today with worsening dysphagia and throat pain.  Esophageal dysphagia/GERD: Patient was last seen last month by Dr. Abbey Chatters and at the time he was able  to control his dysphagia with a soft diet and adequate chewing of his food along with alternating sips of liquids.  He reportedly was admitted to the hospital earlier this month with small bowel obstruction and had NG tube placed for couple of days.  He reported that he was told that he could have some soreness for couple weeks but he continues to have irritation and worsening dysphagia.  He reports that most days even with soft foods he continues to have a feeling like food is getting stuck and burning in his lower chest.  He also reports that cold drinks are particularly bothersome.  He has reported about a 6 pound weight loss in the last 2 weeks.  He has been seeing Dr. Delton Coombes for his recurrence of [facility], last visit yesterday 5/22 who also recommended to continue to use Maalox or Mylanta for the burning pain that he feels in his throat and can consider Carafate if this becomes not helpful.  He gave blessing to perform EGD with dilation and then he will resume his [facility] treatment.  We will proceed with EGD with dilation with Dr. Abbey Chatters in the near future, will need cardiac clearance to hold Plavix for 5 days given NSTEMI about 6 months prior.  We will try to expedite timing of procedure.  Recommended dietary supplementation with protein shakes 1-2 a day to help maintain weight.  History of small bowel obstruction: Admitted from 5/10 to 5/14 for small bowel obstruction.  He had an NG tube for for a few days.  CT evidence of small bowel obstruction.  He had general surgery consultation who suggested medical management and stated he would be high risk for recurrence given peritoneal carcinomatosis.  He denies any constipation or  diarrhea since discharge from the hospital.  He will continue to follow with oncology and resume treatment once EGD with dilation performed  Hepatocellular carcinoma: Following with Dr. Delton Coombes due to recurrence.  CT pancreatic protocol 06/25/2020 at [facility] showed nodular studding and thickening of the ventral mesenteric fat suspicious for peritoneal carcinomatosis, new pancreatic body lesion with pancreatic ductal dilation as well as right lateral abdominal wall deposit.  Has elevated CEA and CA 19-9.  He will receive Keytruda next week and resume bevacizumab after EGD with dilation.  He will have follow-up in 4 weeks.   PLAN   Proceed with upper endoscopy +/- dilation with propofol by Dr. Abbey Chatters in near future: the risks, benefits, and alternatives have been discussed with the patient in detail. The patient states understanding and desires to proceed. ASA 3 Plavix hold for 5 days prior to procedure - Cardiac clearance needed Encourage protein shakes 1-2 a day to help maintain weight Continue with soft diet alternating bites and sips of liquids. Continue Maalox or Mylanta as needed for reflux, can consider Carafate in the future if either of these become unhelpful. We will determine follow-up after procedure.    Venetia Night, MSN, FNP-BC, AGACNP-BC [facility]

## 2021-06-24 ENCOUNTER — Ambulatory Visit (INDEPENDENT_AMBULATORY_CARE_PROVIDER_SITE_OTHER): Payer: Medicare Other | Admitting: Gastroenterology

## 2021-06-24 ENCOUNTER — Encounter: Payer: Self-pay | Admitting: Gastroenterology

## 2021-06-24 ENCOUNTER — Telehealth: Payer: Self-pay | Admitting: *Deleted

## 2021-06-24 ENCOUNTER — Inpatient Hospital Stay (HOSPITAL_COMMUNITY): Payer: Medicare Other

## 2021-06-24 VITALS — BP 138/72 | HR 68 | Temp 97.6°F | Ht 68.0 in | Wt 184.0 lb

## 2021-06-24 DIAGNOSIS — K2100 Gastro-esophageal reflux disease with esophagitis, without bleeding: Secondary | ICD-10-CM

## 2021-06-24 DIAGNOSIS — Z8719 Personal history of other diseases of the digestive system: Secondary | ICD-10-CM | POA: Diagnosis not present

## 2021-06-24 DIAGNOSIS — C220 Liver cell carcinoma: Secondary | ICD-10-CM

## 2021-06-24 DIAGNOSIS — R1319 Other dysphagia: Secondary | ICD-10-CM

## 2021-06-24 LAB — CEA: CEA: 59.1 ng/mL — ABNORMAL HIGH (ref 0.0–4.7)

## 2021-06-24 LAB — CANCER ANTIGEN 19-9: CA 19-9: 22272 U/mL — ABNORMAL HIGH (ref 0–35)

## 2021-06-24 NOTE — Telephone Encounter (Signed)
Sent medication clearance. Waiting on approval.

## 2021-06-24 NOTE — Patient Instructions (Addendum)
We are scheduling you for an EGD in the near future with Dr. Abbey Chatters.  Please continue to eat softer foods and alternate small bites with sips.  In order to help maintain your weight I did encourage you to begin taking some protein supplements 1-2 a day.  We are going to try to get you scheduled as soon as possible.  We will reach out to cardiology for clearance to hold her Plavix for 5 days prior to procedure.  No other medication adjustments needed.  Continue to take Maalox or Mylanta for your acid reflux in the meantime.  It was a pleasure to see you today. I want to create trusting relationships with patients. If you receive a survey regarding your visit,  I greatly appreciate you taking time to fill this out on paper or through your MyChart. I value your feedback.  Venetia Night, MSN, FNP-BC, AGACNP-BC [facility]

## 2021-06-24 NOTE — Telephone Encounter (Signed)
   Primary Cardiologist: Jenkins Rouge, MD  Chart reviewed as part of pre-operative protocol coverage. Given past medical history and time since last visit, based on ACC/AHA guidelines, Logan Alvarez would be at acceptable risk for the planned procedure without further cardiovascular testing.   Patient was just seen in our office by Bernerd Pho, [place] on 06/03/21 for follow-up of CAD.  He had cardiac catheterization 12/03/2020 with DES to mid Cx. Plan per interventional cardiology, Dr. Irish Lack,  for aspirin and Brilinta for 1 month. Given his other comorbidities, would change Brilinta to clopidogrel after a month.  Would like for him to at a minimum complete 3-6 months of dual antiplatelet therapy total. Ideally, he would complete a longer duration.  If there were bleeding issues, could adjust before a year.   Based on the information above (patient has achieved 6 months of DAPT) and recent office visit at which time symptoms were stable, we will provide clearance for patient to hold Plavix for 5 days prior to colonoscopy and resume as soon as hemodynamically stable.   I will route this recommendation to the requesting party via Epic fax function and remove from pre-op pool.  Please call with questions.  Emmaline Life, NP-C    06/24/2021, 3:24 PM [place] [address] Office [phone] Fax [phone]

## 2021-06-24 NOTE — Telephone Encounter (Signed)
Attention: Preop   We would like to request holding the following medication for patient please.  Procedure: Upper Endoscopy   Date: TBD  Medication to hold:  Plavix for 5 days prior   Surgeon: Dr.Carver   Phone: [phone]  Fax:  [phone]  Type of Anesthesia: Propofol ASA III

## 2021-06-25 ENCOUNTER — Telehealth: Payer: Self-pay | Admitting: *Deleted

## 2021-06-25 NOTE — Telephone Encounter (Signed)
Received medication clearance. Copy palced on providers desk.

## 2021-06-26 ENCOUNTER — Ambulatory Visit: Payer: Medicare Other | Admitting: Surgery

## 2021-06-26 NOTE — Telephone Encounter (Signed)
Called pt, informed him ok to hold Plavix for 5 days prior to procedure. EGD/DIL ASA 3 w/Dr. Abbey Chatters scheduled for 07/10/21 at 1:30pm. Orders entered.

## 2021-06-26 NOTE — Telephone Encounter (Signed)
Pre-op appt 07/08/21. Appt letter mailed with procedure instructions. He also has MyChart.

## 2021-07-01 NOTE — Telephone Encounter (Signed)
See prior message. Patient was already scheduled.

## 2021-07-02 ENCOUNTER — Inpatient Hospital Stay (HOSPITAL_COMMUNITY): Payer: Medicare Other

## 2021-07-02 ENCOUNTER — Encounter (HOSPITAL_COMMUNITY): Payer: Self-pay

## 2021-07-02 VITALS — BP 126/75 | HR 73 | Temp 97.5°F | Resp 18 | Wt 183.0 lb

## 2021-07-02 DIAGNOSIS — Z5111 Encounter for antineoplastic chemotherapy: Secondary | ICD-10-CM | POA: Diagnosis not present

## 2021-07-02 DIAGNOSIS — C220 Liver cell carcinoma: Secondary | ICD-10-CM

## 2021-07-02 DIAGNOSIS — Z79899 Other long term (current) drug therapy: Secondary | ICD-10-CM

## 2021-07-02 LAB — COMPREHENSIVE METABOLIC PANEL
ALT: 22 U/L (ref 0–44)
AST: 22 U/L (ref 15–41)
Albumin: 3.5 g/dL (ref 3.5–5.0)
Alkaline Phosphatase: 66 U/L (ref 38–126)
Anion gap: 5 (ref 5–15)
BUN: 13 mg/dL (ref 8–23)
CO2: 26 mmol/L (ref 22–32)
Calcium: 8.9 mg/dL (ref 8.9–10.3)
Chloride: 103 mmol/L (ref 98–111)
Creatinine, Ser: 0.66 mg/dL (ref 0.61–1.24)
GFR, Estimated: >60 mL/min (ref >60)
Glucose, Bld: 178 mg/dL — ABNORMAL HIGH (ref 70–99)
Potassium: 4.1 mmol/L (ref 3.5–5.1)
Sodium: 134 mmol/L — ABNORMAL LOW (ref 135–145)
Total Bilirubin: 1.1 mg/dL (ref 0.3–1.2)
Total Protein: 6.9 g/dL (ref 6.5–8.1)

## 2021-07-02 LAB — CBC WITH DIFFERENTIAL/PLATELET
Abs Immature Granulocytes: 0.05 10*3/uL (ref 0.00–0.07)
Basophils Absolute: 0 10*3/uL (ref 0.0–0.1)
Basophils Relative: 0 %
Eosinophils Absolute: 0 10*3/uL (ref 0.0–0.5)
Eosinophils Relative: 0 %
HCT: 44.3 % (ref 39.0–52.0)
Hemoglobin: 14.7 g/dL (ref 13.0–17.0)
Immature Granulocytes: 1 %
Lymphocytes Relative: 7 %
Lymphs Abs: 0.8 10*3/uL (ref 0.7–4.0)
MCH: 31.6 pg (ref 26.0–34.0)
MCHC: 33.2 g/dL (ref 30.0–36.0)
MCV: 95.3 fL (ref 80.0–100.0)
Monocytes Absolute: 0.5 10*3/uL (ref 0.1–1.0)
Monocytes Relative: 5 %
Neutro Abs: 9.3 10*3/uL — ABNORMAL HIGH (ref 1.7–7.7)
Neutrophils Relative %: 87 %
Platelets: 248 10*3/uL (ref 150–400)
RBC: 4.65 MIL/uL (ref 4.22–5.81)
RDW: 14.4 % (ref 11.5–15.5)
WBC: 10.8 10*3/uL — ABNORMAL HIGH (ref 4.0–10.5)
nRBC: 0 % (ref 0.0–0.2)

## 2021-07-02 LAB — MAGNESIUM: Magnesium: 2.2 mg/dL (ref 1.7–2.4)

## 2021-07-02 LAB — TSH: TSH: 3.464 u[IU]/mL (ref 0.350–4.500)

## 2021-07-02 MED ORDER — SODIUM CHLORIDE 0.9 % IV SOLN: Freq: Once | INTRAVENOUS | Status: AC

## 2021-07-02 MED ORDER — SODIUM CHLORIDE 0.9 % IV SOLN
1200.0000 mg | Freq: Once | INTRAVENOUS | Status: AC
  Administered 2021-07-02: 1200 mg via INTRAVENOUS
  Filled 2021-07-02: qty 20

## 2021-07-02 NOTE — Progress Notes (Signed)
Patient presents today for Tecentriq only per Sylvester Harder RN / Dr. Delton Coombes. Labs within parameters for treatment. Vital signs within parameters for treatment.   Treatment given today per MD orders. Tolerated infusion without adverse affects. Vital signs stable. No complaints at this time. Discharged from clinic ambulatory in stable condition. Alert and oriented x 3. F/U with [facility] as scheduled.

## 2021-07-02 NOTE — Patient Instructions (Signed)
[facility]  Discharge Instructions: ?Thank you for choosing [facility] to provide your oncology and hematology care.  ?If you have a lab appointment with the Cancer Center, please come in thru the Main Entrance and check in at the main information desk. ? ?Wear comfortable clothing and clothing appropriate for easy access to any Portacath or PICC line.  ? ?We strive to give you quality time with your provider. You may need to reschedule your appointment if you arrive late (15 or more minutes).  Arriving late affects you and other patients whose appointments are after yours.  Also, if you miss three or more appointments without notifying the office, you may be dismissed from the clinic at the provider?s discretion.    ?  ?For prescription refill requests, have your pharmacy contact our office and allow 72 hours for refills to be completed.   ? ?Today you received the following chemotherapy and/or immunotherapy agents Tecentriq ?  ?To help prevent nausea and vomiting after your treatment, we encourage you to take your nausea medication as directed. ? ?BELOW ARE SYMPTOMS THAT SHOULD BE REPORTED IMMEDIATELY: ?*FEVER GREATER THAN 100.4 F (38 ?C) OR HIGHER ?*CHILLS OR SWEATING ?*NAUSEA AND VOMITING THAT IS NOT CONTROLLED WITH YOUR NAUSEA MEDICATION ?*UNUSUAL SHORTNESS OF BREATH ?*UNUSUAL BRUISING OR BLEEDING ?*URINARY PROBLEMS (pain or burning when urinating, or frequent urination) ?*BOWEL PROBLEMS (unusual diarrhea, constipation, pain near the anus) ?TENDERNESS IN MOUTH AND THROAT WITH OR WITHOUT PRESENCE OF ULCERS (sore throat, sores in mouth, or a toothache) ?UNUSUAL RASH, SWELLING OR PAIN  ?UNUSUAL VAGINAL DISCHARGE OR ITCHING  ? ?Items with * indicate a potential emergency and should be followed up as soon as possible or go to the Emergency Department if any problems should occur. ? ?Please show the CHEMOTHERAPY ALERT CARD or IMMUNOTHERAPY ALERT CARD at check-in to the Emergency  Department and triage nurse. ? ?Should you have questions after your visit or need to cancel or reschedule your appointment, please contact [facility] [phone]  and follow the prompts.  Office hours are 8:00 a.m. to 4:30 p.m. Monday - Friday. Please note that voicemails left after 4:00 p.m. may not be returned until the following business day.  We are closed weekends and major holidays. You have access to a nurse at all times for urgent questions. Please call the main number to the clinic [phone] and follow the prompts. ? ?For any non-urgent questions, you may also contact your provider using MyChart. We now offer e-Visits for anyone 18 and older to request care online for non-urgent symptoms. For details visit [email or web address]. ?  ?Also download the MyChart app! Go to the app store, search "MyChart", open the app, select [facility], and log in with your MyChart username and password. ? ?Due to Covid, a mask is required upon entering the hospital/clinic. If you do not have a mask, one will be given to you upon arrival. For doctor visits, patients may have 1 support person aged 18 or older with them. For treatment visits, patients cannot have anyone with them due to current Covid guidelines and our immunocompromised population.  ?

## 2021-07-02 NOTE — Progress Notes (Signed)
Confirmed patient on Tecentriq not Keytruda as OV note says.  Henreitta Leber, PharmD

## 2021-07-04 ENCOUNTER — Other Ambulatory Visit: Payer: Self-pay | Admitting: Family Medicine

## 2021-07-04 ENCOUNTER — Telehealth: Payer: Self-pay

## 2021-07-04 MED ORDER — METOPROLOL TARTRATE 25 MG PO TABS: 12.5000 mg | ORAL_TABLET | Freq: Two times a day (BID) | ORAL | 3 refills | Status: AC

## 2021-07-04 NOTE — Telephone Encounter (Signed)
Medication refill request for Metoprolol 12.5 mg tablets BID sent to [facility] per pt's req.

## 2021-07-07 NOTE — Patient Instructions (Signed)
Logan Alvarez  07/07/2021     '@PREFPERIOPPHARMACY'$ @   Your procedure is scheduled on  07/10/2021.   Report to [facility] at  1130  A.M.   Call this number if you have problems the morning of surgery:  [phone]   Remember:  Follow the diet instructions given to you by the office.    Your last dose of plavix should be on 07/04/2021.     Take these medicines the morning of surgery with A SIP OF WATER      xanax(if needed), metoprolol, oxy IR (if needed), prednisone.     Do not wear jewelry, make-up or nail polish.  Do not wear lotions, powders, or perfumes, or deodorant.  Do not shave 48 hours prior to surgery.  Men may shave face and neck.  Do not bring valuables to the hospital.  [facility] is not responsible for any belongings or valuables.  Contacts, dentures or bridgework may not be worn into surgery.  Leave your suitcase in the car.  After surgery it may be brought to your room.  For patients admitted to the hospital, discharge time will be determined by your treatment team.  Patients discharged the day of surgery will not be allowed to drive home and must have someone with them for 24 hours.    Special instructions:   DO NOT smoke tobacco or vape for 24 hours before your procedure.  Please read over the following fact sheets that you were given. Anesthesia Post-op Instructions and Care and Recovery After Surgery      Upper Endoscopy, Adult, Care After This sheet gives you information about how to care for yourself after your procedure. Your health care provider may also give you more specific instructions. If you have problems or questions, contact your health care provider. What can I expect after the procedure? After the procedure, it is common to have: A sore throat. Mild stomach pain or discomfort. Bloating. Nausea. Follow these instructions at home:  Follow instructions from your health care provider about what to eat or drink after your  procedure. Return to your normal activities as told by your health care provider. Ask your health care provider what activities are safe for you. Take over-the-counter and prescription medicines only as told by your health care provider. If you were given a sedative during the procedure, it can affect you for several hours. Do not drive or operate machinery until your health care provider says that it is safe. Keep all follow-up visits as told by your health care provider. This is important. Contact a health care provider if you have: A sore throat that lasts longer than one day. Trouble swallowing. Get help right away if: You vomit blood or your vomit looks like coffee grounds. You have: A fever. Bloody, black, or tarry stools. A severe sore throat or you cannot swallow. Difficulty breathing. Severe pain in your chest or abdomen. Summary After the procedure, it is common to have a sore throat, mild stomach discomfort, bloating, and nausea. If you were given a sedative during the procedure, it can affect you for several hours. Do not drive or operate machinery until your health care provider says that it is safe. Follow instructions from your health care provider about what to eat or drink after your procedure. Return to your normal activities as told by your health care provider. This information is not intended to replace advice given to you by your health  care provider. Make sure you discuss any questions you have with your health care provider. Document Revised: 11/25/2018 Document Reviewed: 06/21/2017 Elsevier Patient Education  [place]. Esophageal Dilatation Esophageal dilatation, also called esophageal dilation, is a procedure to widen or open a blocked or narrowed part of the esophagus. The esophagus is the part of the body that moves food and liquid from the mouth to the stomach. You may need this procedure if: You have a buildup of scar tissue in your esophagus that  makes it difficult, painful, or impossible to swallow. This can be caused by gastroesophageal reflux disease (GERD). You have cancer of the esophagus. There is a problem with how food moves through your esophagus. In some cases, you may need this procedure repeated at a later time to dilate the esophagus gradually. Tell a health care provider about: Any allergies you have. All medicines you are taking, including vitamins, herbs, eye drops, creams, and over-the-counter medicines. Any problems you or family members have had with anesthetic medicines. Any blood disorders you have. Any surgeries you have had. Any medical conditions you have. Any antibiotic medicines you are required to take before dental procedures. Whether you are pregnant or may be pregnant. What are the risks? Generally, this is a safe procedure. However, problems may occur, including: Bleeding due to a tear in the lining of the esophagus. A hole, or perforation, in the esophagus. What happens before the procedure? Ask your health care provider about: Changing or stopping your regular medicines. This is especially important if you are taking diabetes medicines or blood thinners. Taking medicines such as aspirin and ibuprofen. These medicines can thin your blood. Do not take these medicines unless your health care provider tells you to take them. Taking over-the-counter medicines, vitamins, herbs, and supplements. Follow instructions from your health care provider about eating or drinking restrictions. Plan to have a responsible adult take you home from the hospital or clinic. Plan to have a responsible adult care for you for the time you are told after you leave the hospital or clinic. This is important. What happens during the procedure? You may be given a medicine to help you relax (sedative). A numbing medicine may be sprayed into the back of your throat, or you may gargle the medicine. Your health care provider may  perform the dilatation using various surgical instruments, such as: Simple dilators. This instrument is carefully placed in the esophagus to stretch it. Guided wire bougies. This involves using an endoscope to insert a wire into the esophagus. A dilator is passed over this wire to enlarge the esophagus. Then the wire is removed. Balloon dilators. An endoscope with a small balloon is inserted into the esophagus. The balloon is inflated to stretch the esophagus and open it up. The procedure may vary among health care providers and hospitals. What can I expect after the procedure? Your blood pressure, heart rate, breathing rate, and blood oxygen level will be monitored until you leave the hospital or clinic. Your throat may feel slightly sore and numb. This will get better over time. You will not be allowed to eat or drink until your throat is no longer numb. When you are able to drink, urinate, and sit on the edge of the bed without nausea or dizziness, you may be able to return home. Follow these instructions at home: Take over-the-counter and prescription medicines only as told by your health care provider. If you were given a sedative during the procedure, it can affect  you for several hours. Do not drive or operate machinery until your health care provider says that it is safe. Plan to have a responsible adult care for you for the time you are told. This is important. Follow instructions from your health care provider about any eating or drinking restrictions. Do not use any products that contain nicotine or tobacco, such as cigarettes, e-cigarettes, and chewing tobacco. If you need help quitting, ask your health care provider. Keep all follow-up visits. This is important. Contact a health care provider if: You have a fever. You have pain that is not relieved by medicine. Get help right away if: You have chest pain. You have trouble breathing. You have trouble swallowing. You vomit  blood. You have black, tarry, or bloody stools. These symptoms may represent a serious problem that is an emergency. Do not wait to see if the symptoms will go away. Get medical help right away. Call your local emergency services (911 in the [place] Do not drive yourself to the hospital. Summary Esophageal dilatation, also called esophageal dilation, is a procedure to widen or open a blocked or narrowed part of the esophagus. Plan to have a responsible adult take you home from the hospital or clinic. For this procedure, a numbing medicine may be sprayed into the back of your throat, or you may gargle the medicine. Do not drive or operate machinery until your health care provider says that it is safe. This information is not intended to replace advice given to you by your health care provider. Make sure you discuss any questions you have with your health care provider. Document Revised: 06/07/2019 Document Reviewed: 06/07/2019 Elsevier Patient Education  [place] After This sheet gives you information about how to care for yourself after your procedure. Your health care provider may also give you more specific instructions. If you have problems or questions, contact your health care provider. What can I expect after the procedure? After the procedure, it is common to have: Tiredness. Forgetfulness about what happened after the procedure. Impaired judgment for important decisions. Nausea or vomiting. Some difficulty with balance. Follow these instructions at home: For the time period you were told by your health care provider:     Rest as needed. Do not participate in activities where you could fall or become injured. Do not drive or use machinery. Do not drink alcohol. Do not take sleeping pills or medicines that cause drowsiness. Do not make important decisions or sign legal documents. Do not take care of children on your own. Eating and  drinking Follow the diet that is recommended by your health care provider. Drink enough fluid to keep your urine pale yellow. If you vomit: Drink water, juice, or soup when you can drink without vomiting. Make sure you have little or no nausea before eating solid foods. General instructions Have a responsible adult stay with you for the time you are told. It is important to have someone help care for you until you are awake and alert. Take over-the-counter and prescription medicines only as told by your health care provider. If you have sleep apnea, surgery and certain medicines can increase your risk for breathing problems. Follow instructions from your health care provider about wearing your sleep device: Anytime you are sleeping, including during daytime naps. While taking prescription pain medicines, sleeping medicines, or medicines that make you drowsy. Avoid smoking. Keep all follow-up visits as told by your health care provider. This is important. Contact  a health care provider if: You keep feeling nauseous or you keep vomiting. You feel light-headed. You are still sleepy or having trouble with balance after 24 hours. You develop a rash. You have a fever. You have redness or swelling around the IV site. Get help right away if: You have trouble breathing. You have new-onset confusion at home. Summary For several hours after your procedure, you may feel tired. You may also be forgetful and have poor judgment. Have a responsible adult stay with you for the time you are told. It is important to have someone help care for you until you are awake and alert. Rest as told. Do not drive or operate machinery. Do not drink alcohol or take sleeping pills. Get help right away if you have trouble breathing, or if you suddenly become confused. This information is not intended to replace advice given to you by your health care provider. Make sure you discuss any questions you have with your  health care provider. Document Revised: 12/24/2020 Document Reviewed: 12/22/2018 Elsevier Patient Education  [place].

## 2021-07-08 ENCOUNTER — Ambulatory Visit (INDEPENDENT_AMBULATORY_CARE_PROVIDER_SITE_OTHER): Payer: Medicare Other | Admitting: Family Medicine

## 2021-07-08 ENCOUNTER — Encounter (HOSPITAL_COMMUNITY): Payer: Self-pay

## 2021-07-08 ENCOUNTER — Encounter (HOSPITAL_COMMUNITY)
Admission: RE | Admit: 2021-07-08 | Discharge: 2021-07-08 | Disposition: A | Discharge status: Home / Self Care | Payer: Medicare Other | Source: Ambulatory Visit | Attending: Internal Medicine | Admitting: Internal Medicine

## 2021-07-08 ENCOUNTER — Encounter: Payer: Self-pay | Admitting: Family Medicine

## 2021-07-08 VITALS — BP 120/72 | HR 104 | Temp 97.3°F | Wt 185.8 lb

## 2021-07-08 DIAGNOSIS — F411 Generalized anxiety disorder: Secondary | ICD-10-CM | POA: Diagnosis not present

## 2021-07-08 DIAGNOSIS — C786 Secondary malignant neoplasm of retroperitoneum and peritoneum: Secondary | ICD-10-CM | POA: Diagnosis not present

## 2021-07-08 DIAGNOSIS — I2510 Atherosclerotic heart disease of native coronary artery without angina pectoris: Secondary | ICD-10-CM

## 2021-07-08 DIAGNOSIS — G8929 Other chronic pain: Secondary | ICD-10-CM | POA: Diagnosis not present

## 2021-07-08 HISTORY — DX: Neoplasm of unspecified behavior of digestive system: D49.0

## 2021-07-08 MED ORDER — OXYCODONE HCL 5 MG PO TABS: 5.0000 mg | ORAL_TABLET | ORAL | 0 refills | Status: DC | PRN

## 2021-07-08 NOTE — Progress Notes (Signed)
   Subjective:    Patient ID: Logan Alvarez, male    DOB: 26-Jan-1956, 66 y.o.   MRN: [id number]  HPI Pt here to follow up. Pt had cancer treatment last week. This week fighting achy muscles in lower legs and buttocks-has done this before. Pt unsure if the aches are from treatments or from doing things he doesn't normally do. Dr.K has patient on steroid due to aches-that seemed to improved but flared back up about 2 days ago. Pt taking oxycodone at night to sleep.  Patient relates that he uses oxycodone in the evening to help with his pain he typically takes the medication closer to bedtime.  I encouraged him to start taking it more toward approximately an hour and a half before bedtime to allow him to have some evening hours with less pain he does not abuse the medicine is currently taking 1 or 2/day it does take the edge off his pain to allow him to function better Review of Systems     Objective:   Physical Exam Lungs are clear hearts regular pulse normal BP good abdomen no tenderness       Assessment & Plan:  Metastatic cancer Continuing treatments Patient feels good about the direction he is having Denies any intestinal obstruction issues Should he have any trouble to go to the ER Otherwise continue medications as is Patient does have significant anxiety and is using his medication 3-4 times per day Also has pain from the metastatic cancer typically takes 1 pain medicine each evening see discussion above Prescription was sent in patient to notify us when needing an additional 1 follow-up in the in approximately 2-1/2 months

## 2021-07-08 NOTE — Patient Instructions (Signed)
Hi Dempsey  I am glad to see things are heading in the right direction Should you have any problems or concerns please reach out Lets recheck you again toward late August I did send in 1 additional prescription for your pain medicine When you need additional prescription please send me a message Thanks-Dr. Nicki Reaper

## 2021-07-10 ENCOUNTER — Encounter (HOSPITAL_COMMUNITY)
Admission: RE | Disposition: A | Discharge status: Home / Self Care | Payer: Self-pay | Source: Home / Self Care | Attending: Internal Medicine

## 2021-07-10 ENCOUNTER — Other Ambulatory Visit: Payer: Self-pay

## 2021-07-10 ENCOUNTER — Ambulatory Visit (HOSPITAL_COMMUNITY)
Admission: RE | Admit: 2021-07-10 | Discharge: 2021-07-10 | Disposition: A | Discharge status: Home / Self Care | Payer: Medicare Other | Attending: Internal Medicine | Admitting: Internal Medicine

## 2021-07-10 ENCOUNTER — Ambulatory Visit (HOSPITAL_BASED_OUTPATIENT_CLINIC_OR_DEPARTMENT_OTHER): Payer: Medicare Other | Admitting: Anesthesiology

## 2021-07-10 ENCOUNTER — Encounter (HOSPITAL_COMMUNITY): Payer: Self-pay

## 2021-07-10 ENCOUNTER — Ambulatory Visit (HOSPITAL_COMMUNITY): Payer: Medicare Other | Admitting: Anesthesiology

## 2021-07-10 DIAGNOSIS — K2210 Ulcer of esophagus without bleeding: Secondary | ICD-10-CM

## 2021-07-10 DIAGNOSIS — I10 Essential (primary) hypertension: Secondary | ICD-10-CM | POA: Insufficient documentation

## 2021-07-10 DIAGNOSIS — K449 Diaphragmatic hernia without obstruction or gangrene: Secondary | ICD-10-CM

## 2021-07-10 DIAGNOSIS — K2970 Gastritis, unspecified, without bleeding: Secondary | ICD-10-CM | POA: Insufficient documentation

## 2021-07-10 DIAGNOSIS — R1310 Dysphagia, unspecified: Secondary | ICD-10-CM | POA: Insufficient documentation

## 2021-07-10 DIAGNOSIS — Z7902 Long term (current) use of antithrombotics/antiplatelets: Secondary | ICD-10-CM | POA: Insufficient documentation

## 2021-07-10 DIAGNOSIS — E785 Hyperlipidemia, unspecified: Secondary | ICD-10-CM | POA: Diagnosis not present

## 2021-07-10 DIAGNOSIS — K648 Other hemorrhoids: Secondary | ICD-10-CM | POA: Insufficient documentation

## 2021-07-10 DIAGNOSIS — I2510 Atherosclerotic heart disease of native coronary artery without angina pectoris: Secondary | ICD-10-CM | POA: Insufficient documentation

## 2021-07-10 DIAGNOSIS — Z955 Presence of coronary angioplasty implant and graft: Secondary | ICD-10-CM | POA: Diagnosis not present

## 2021-07-10 DIAGNOSIS — Z79899 Other long term (current) drug therapy: Secondary | ICD-10-CM | POA: Insufficient documentation

## 2021-07-10 DIAGNOSIS — C220 Liver cell carcinoma: Secondary | ICD-10-CM | POA: Insufficient documentation

## 2021-07-10 DIAGNOSIS — K222 Esophageal obstruction: Secondary | ICD-10-CM | POA: Insufficient documentation

## 2021-07-10 HISTORY — PX: BALLOON DILATION: SHX5330

## 2021-07-10 HISTORY — PX: ESOPHAGOGASTRODUODENOSCOPY (EGD) WITH PROPOFOL: SHX5813

## 2021-07-10 SURGERY — ESOPHAGOGASTRODUODENOSCOPY (EGD) WITH PROPOFOL
Anesthesia: General

## 2021-07-10 MED ORDER — PROPOFOL 500 MG/50ML IV EMUL
INTRAVENOUS | Status: AC
  Filled 2021-07-10: qty 50

## 2021-07-10 MED ORDER — PANTOPRAZOLE SODIUM 40 MG PO TBEC: 40.0000 mg | DELAYED_RELEASE_TABLET | Freq: Every day | ORAL | 1 refills | Status: DC

## 2021-07-10 MED ORDER — LACTATED RINGERS IV SOLN: INTRAVENOUS | Status: DC

## 2021-07-10 MED ORDER — PROPOFOL 500 MG/50ML IV EMUL
INTRAVENOUS | Status: DC | PRN
  Administered 2021-07-10: 200 ug/kg/min via INTRAVENOUS

## 2021-07-10 MED ORDER — PROPOFOL 10 MG/ML IV BOLUS
INTRAVENOUS | Status: DC | PRN
  Administered 2021-07-10: 100 mg via INTRAVENOUS

## 2021-07-10 NOTE — Anesthesia Postprocedure Evaluation (Signed)
Anesthesia Post Note  Patient: Logan Alvarez  Procedure(s) Performed: ESOPHAGOGASTRODUODENOSCOPY (EGD) WITH PROPOFOL BALLOON DILATION  Patient location during evaluation: Phase II Anesthesia Type: General Level of consciousness: awake Pain management: pain level controlled Vital Signs Assessment: post-procedure vital signs reviewed and stable Respiratory status: spontaneous breathing and respiratory function stable Cardiovascular status: blood pressure returned to baseline and stable Postop Assessment: no headache and no apparent nausea or vomiting Anesthetic complications: no Comments: Late entry   No notable events documented.   Last Vitals:  Vitals:   07/10/21 1130 07/10/21 1239  BP: 109/70 (!) 101/56  Pulse: 72 78  Resp: (!) 9 (!) 21  Temp: 36.5 C 36.5 C  SpO2: 100% 95%    Last Pain:  Vitals:   07/10/21 1239  TempSrc: Oral  PainSc: 0-No pain                 Louann Sjogren

## 2021-07-10 NOTE — Op Note (Signed)
[facility] Patient Name: Logan Alvarez Procedure Date: 07/10/2021 12:11 PM MRN: [id number] Date of Birth: [birth year] Attending MD: Elon Alas. Abbey Chatters DO CSN: [id number] Age: 66 Admit Type: Outpatient Procedure:                Upper GI endoscopy Indications:              Dysphagia Providers:                Elon Alas. Abbey Chatters, DO, Janeece Riggers, RN, Caprice Kluver Referring MD:              Medicines:                See the Anesthesia note for documentation of the                            administered medications Complications:            No immediate complications. Estimated Blood Loss:     Estimated blood loss was minimal. Procedure:                Pre-Anesthesia Assessment:                           - The anesthesia plan was to use monitored                            anesthesia care (MAC).                           After obtaining informed consent, the endoscope was                            passed under direct vision. Throughout the                            procedure, the patient's blood pressure, pulse, and                            oxygen saturations were monitored continuously. The                            [id number]-H190 ([id number]) scope was introduced through the                            mouth, and advanced to the second part of duodenum.                            The upper GI endoscopy was accomplished without                            difficulty. The patient tolerated the procedure                            well. Scope In: 12:28:25 PM Scope Out: 12:33:17 PM Total Procedure Duration: 0 hours 4 minutes 52 seconds  Findings:      A small hiatal hernia was present.  Two superficial esophageal ulcers with no bleeding and no stigmata of       recent bleeding were found. Likely from NGT previously      A severe Schatzki ring was found in the distal esophagus. A TTS dilator       was passed through the scope. Dilation with a 11-13-10 mm balloon       dilator was performed  to 12 mm. The dilation site was examined and       showed mild mucosal disruption and moderate improvement in luminal       narrowing.      Diffuse mild inflammation characterized by erythema was found in the       entire examined stomach.      The duodenal bulb, first portion of the duodenum and second portion of       the duodenum were normal. Impression:               - Small hiatal hernia.                           - Esophageal ulcers with no bleeding and no                            stigmata of recent bleeding.                           - Severe Schatzki ring. Dilated.                           - Gastritis.                           - Normal duodenal bulb, first portion of the                            duodenum and second portion of the duodenum.                           - No specimens collected. Moderate Sedation:      Per Anesthesia Care Recommendation:           - Patient has a contact number available for                            emergencies. The signs and symptoms of potential                            delayed complications were discussed with the                            patient. Return to normal activities tomorrow.                            Written discharge instructions were provided to the                            patient.                           -  Resume previous diet.                           - Continue present medications.                           - Use a proton pump inhibitor PO BID.                           - Consider repeat EGD with repeat dilation up to                            43m in 6-8 weeks. Procedure Code(s):        --- Professional ---                           [phone] Esophagogastroduodenoscopy, flexible,                            transoral; with transendoscopic balloon dilation of                            esophagus (less than 30 mm diameter) Diagnosis Code(s):        --- Professional ---                           K44.9, Diaphragmatic  hernia without obstruction or                            gangrene                           K22.10, Ulcer of esophagus without bleeding                           K22.2, Esophageal obstruction                           K29.70, Gastritis, unspecified, without bleeding                           R13.10, Dysphagia, unspecified CPT copyright 2019 American Medical Association. All rights reserved. The codes documented in this report are preliminary and upon coder review may  be revised to meet current compliance requirements. CElon Alas CAbbey Chatters DO CAldenCAbbey Chatters DO 07/10/2021 12:42:14 PM This report has been signed electronically. Number of Addenda: 0

## 2021-07-10 NOTE — Anesthesia Preprocedure Evaluation (Signed)
Anesthesia Evaluation  Patient identified by MRN, date of birth, ID band Patient awake    Reviewed: Allergy & Precautions, H&P , NPO status , Patient's Chart, lab work & pertinent test results, reviewed documented beta blocker date and time   Airway Mallampati: II  TM Distance: >3 FB Neck ROM: full    Dental no notable dental hx.    Pulmonary neg pulmonary ROS,    Pulmonary exam normal breath sounds clear to auscultation       Cardiovascular Exercise Tolerance: Good hypertension, + CAD   Rhythm:regular Rate:Normal     Neuro/Psych PSYCHIATRIC DISORDERS Anxiety negative neurological ROS     GI/Hepatic negative GI ROS, Neg liver ROS,   Endo/Other  negative endocrine ROS  Renal/GU negative Renal ROS  negative genitourinary   Musculoskeletal   Abdominal   Peds  Hematology negative hematology ROS (+)   Anesthesia Other Findings Liver CA  Reproductive/Obstetrics negative OB ROS                             Anesthesia Physical Anesthesia Plan  ASA: 3  Anesthesia Plan: General   Post-op Pain Management:    Induction:   PONV Risk Score and Plan: Propofol infusion  Airway Management Planned:   Additional Equipment:   Intra-op Plan:   Post-operative Plan:   Informed Consent: I have reviewed the patients History and Physical, chart, labs and discussed the procedure including the risks, benefits and alternatives for the proposed anesthesia with the patient or authorized representative who has indicated his/her understanding and acceptance.     Dental Advisory Given  Plan Discussed with: CRNA  Anesthesia Plan Comments:         Anesthesia Quick Evaluation

## 2021-07-10 NOTE — Interval H&P Note (Signed)
History and Physical Interval Note:  07/10/2021 11:21 AM  Logan Alvarez  has presented today for surgery, with the diagnosis of dysphagia, odynophagia, shatzkis ring.  The various methods of treatment have been discussed with the patient and family. After consideration of risks, benefits and other options for treatment, the patient has consented to  Procedure(s) with comments: ESOPHAGOGASTRODUODENOSCOPY (EGD) WITH PROPOFOL (N/A) - 1:30pm BALLOON DILATION (N/A) as a surgical intervention.  The patient's history has been reviewed, patient examined, no change in status, stable for surgery.  I have reviewed the patient's chart and labs.  Questions were answered to the patient's satisfaction.     Eloise Harman

## 2021-07-10 NOTE — Discharge Instructions (Addendum)
EGD Discharge instructions Please read the instructions outlined below and refer to this sheet in the next few weeks. These discharge instructions provide you with general information on caring for yourself after you leave the hospital. Your doctor may also give you specific instructions. While your treatment has been planned according to the most current medical practices available, unavoidable complications occasionally occur. If you have any problems or questions after discharge, please call your doctor. ACTIVITY You may resume your regular activity but move at a slower pace for the next 24 hours.  Take frequent rest periods for the next 24 hours.  Walking will help expel (get rid of) the air and reduce the bloated feeling in your abdomen.  No driving for 24 hours (because of the anesthesia (medicine) used during the test).  You may shower.  Do not sign any important legal documents or operate any machinery for 24 hours (because of the anesthesia used during the test).  NUTRITION Drink plenty of fluids.  You may resume your normal diet.  Begin with a light meal and progress to your normal diet.  Avoid alcoholic beverages for 24 hours or as instructed by your caregiver.  MEDICATIONS You may resume your normal medications unless your caregiver tells you otherwise.  WHAT YOU CAN EXPECT TODAY You may experience abdominal discomfort such as a feeling of fullness or "gas" pains.  FOLLOW-UP Your doctor will discuss the results of your test with you.  SEEK IMMEDIATE MEDICAL ATTENTION IF ANY OF THE FOLLOWING OCCUR: Excessive nausea (feeling sick to your stomach) and/or vomiting.  Severe abdominal pain and distention (swelling).  Trouble swallowing.  Temperature over 101 F (37.8 C).  Rectal bleeding or vomiting of blood.   You had 2 superficial ulcers in your esophagus from previous NG tube.  You again had a severe stricture of your esophagus which I stretched with the balloon today.   Hopefully this helps with your swallowing.  I am going to start you on pantoprazole 40 mg daily.  We can consider further dilation in the near future.  Restart Plavix tomorrow.   I hope you have a great rest of your week!  Logan Alvarez. Logan Alvarez, D.O. Gastroenterology and Hepatology [facility]

## 2021-07-10 NOTE — Transfer of Care (Signed)
Immediate Anesthesia Transfer of Care Note  Patient: Logan Alvarez  Procedure(s) Performed: ESOPHAGOGASTRODUODENOSCOPY (EGD) WITH PROPOFOL BALLOON DILATION  Patient Location: PACU  Anesthesia Type:General  Level of Consciousness: awake, alert  and oriented  Airway & Oxygen Therapy: Patient Spontanous Breathing and Patient connected to nasal cannula oxygen  Post-op Assessment: Report given to RN, Post -op Vital signs reviewed and stable and Patient moving all extremities X 4  Post vital signs: Reviewed and stable  Last Vitals:  Vitals Value Taken Time  BP 101/56   Temp 97.6   Pulse 78   Resp 21   SpO2 95     Last Pain:  Vitals:   07/10/21 1224  PainSc: 0-No pain         Complications: No notable events documented.

## 2021-07-11 ENCOUNTER — Telehealth: Payer: Self-pay

## 2021-07-11 DIAGNOSIS — K2100 Gastro-esophageal reflux disease with esophagitis, without bleeding: Secondary | ICD-10-CM

## 2021-07-11 DIAGNOSIS — R1319 Other dysphagia: Secondary | ICD-10-CM

## 2021-07-11 MED ORDER — PANTOPRAZOLE SODIUM 40 MG PO TBEC: 40.0000 mg | DELAYED_RELEASE_TABLET | Freq: Two times a day (BID) | ORAL | 3 refills | Status: DC

## 2021-07-11 NOTE — Telephone Encounter (Signed)
Sent in pantoprazole 40 mg BID, 90 day supply. Per Dr. Abbey Chatters recommendation post EGD yesterday 07/10/21.  Venetia Night, MSN, FNP-BC, AGACNP-BC [facility]

## 2021-07-11 NOTE — Telephone Encounter (Signed)
Pharmacy faxed over a request.

## 2021-07-11 NOTE — Addendum Note (Signed)
Addended by: Sherron Monday on: 07/11/2021 10:19 AM   Modules accepted: Orders

## 2021-07-11 NOTE — Telephone Encounter (Signed)
Refill request received from [facility] for Pantoprazole 40 mg tablets Qty: 90. Last ov was on 06/24/21.

## 2021-07-15 ENCOUNTER — Inpatient Hospital Stay (HOSPITAL_COMMUNITY): Payer: Medicare Other | Admitting: Hematology

## 2021-07-15 ENCOUNTER — Inpatient Hospital Stay (HOSPITAL_COMMUNITY): Payer: Medicare Other

## 2021-07-17 ENCOUNTER — Encounter (HOSPITAL_COMMUNITY): Payer: Self-pay | Admitting: Internal Medicine

## 2021-07-17 ENCOUNTER — Ambulatory Visit: Payer: Medicare Other | Admitting: Internal Medicine

## 2021-07-23 ENCOUNTER — Encounter (HOSPITAL_COMMUNITY): Payer: Self-pay | Admitting: Hematology

## 2021-07-23 ENCOUNTER — Inpatient Hospital Stay (HOSPITAL_COMMUNITY): Payer: Medicare Other | Attending: Hematology

## 2021-07-23 ENCOUNTER — Inpatient Hospital Stay (HOSPITAL_COMMUNITY): Payer: Medicare Other

## 2021-07-23 ENCOUNTER — Inpatient Hospital Stay (HOSPITAL_BASED_OUTPATIENT_CLINIC_OR_DEPARTMENT_OTHER): Payer: Medicare Other | Admitting: Hematology

## 2021-07-23 VITALS — BP 124/71 | HR 77 | Temp 97.1°F | Resp 18 | Ht 68.0 in | Wt 185.7 lb

## 2021-07-23 DIAGNOSIS — K222 Esophageal obstruction: Secondary | ICD-10-CM | POA: Diagnosis not present

## 2021-07-23 DIAGNOSIS — M791 Myalgia, unspecified site: Secondary | ICD-10-CM | POA: Diagnosis not present

## 2021-07-23 DIAGNOSIS — C220 Liver cell carcinoma: Secondary | ICD-10-CM | POA: Insufficient documentation

## 2021-07-23 DIAGNOSIS — R970 Elevated carcinoembryonic antigen [CEA]: Secondary | ICD-10-CM | POA: Insufficient documentation

## 2021-07-23 DIAGNOSIS — R143 Flatulence: Secondary | ICD-10-CM | POA: Diagnosis not present

## 2021-07-23 DIAGNOSIS — K7460 Unspecified cirrhosis of liver: Secondary | ICD-10-CM | POA: Insufficient documentation

## 2021-07-23 DIAGNOSIS — Z79899 Other long term (current) drug therapy: Secondary | ICD-10-CM | POA: Insufficient documentation

## 2021-07-23 DIAGNOSIS — M2550 Pain in unspecified joint: Secondary | ICD-10-CM | POA: Diagnosis not present

## 2021-07-23 DIAGNOSIS — C786 Secondary malignant neoplasm of retroperitoneum and peritoneum: Secondary | ICD-10-CM | POA: Diagnosis not present

## 2021-07-23 DIAGNOSIS — K5900 Constipation, unspecified: Secondary | ICD-10-CM | POA: Insufficient documentation

## 2021-07-23 DIAGNOSIS — Z7952 Long term (current) use of systemic steroids: Secondary | ICD-10-CM | POA: Insufficient documentation

## 2021-07-23 DIAGNOSIS — I10 Essential (primary) hypertension: Secondary | ICD-10-CM | POA: Insufficient documentation

## 2021-07-23 DIAGNOSIS — Z5111 Encounter for antineoplastic chemotherapy: Secondary | ICD-10-CM | POA: Insufficient documentation

## 2021-07-23 DIAGNOSIS — Z5112 Encounter for antineoplastic immunotherapy: Secondary | ICD-10-CM | POA: Insufficient documentation

## 2021-07-23 DIAGNOSIS — R978 Other abnormal tumor markers: Secondary | ICD-10-CM | POA: Insufficient documentation

## 2021-07-23 LAB — COMPREHENSIVE METABOLIC PANEL
ALT: 750 U/L — ABNORMAL HIGH (ref 0–44)
AST: 514 U/L — ABNORMAL HIGH (ref 15–41)
Albumin: 3.3 g/dL — ABNORMAL LOW (ref 3.5–5.0)
Alkaline Phosphatase: 142 U/L — ABNORMAL HIGH (ref 38–126)
Anion gap: 6 (ref 5–15)
BUN: 11 mg/dL (ref 8–23)
CO2: 27 mmol/L (ref 22–32)
Calcium: 8.8 mg/dL — ABNORMAL LOW (ref 8.9–10.3)
Chloride: 99 mmol/L (ref 98–111)
Creatinine, Ser: 0.61 mg/dL (ref 0.61–1.24)
GFR, Estimated: >60 mL/min (ref >60)
Glucose, Bld: 157 mg/dL — ABNORMAL HIGH (ref 70–99)
Potassium: 4.3 mmol/L (ref 3.5–5.1)
Sodium: 132 mmol/L — ABNORMAL LOW (ref 135–145)
Total Bilirubin: 2 mg/dL — ABNORMAL HIGH (ref 0.3–1.2)
Total Protein: 6.8 g/dL (ref 6.5–8.1)

## 2021-07-23 LAB — CBC WITH DIFFERENTIAL/PLATELET
Abs Immature Granulocytes: 0.02 10*3/uL (ref 0.00–0.07)
Basophils Absolute: 0 10*3/uL (ref 0.0–0.1)
Basophils Relative: 0 %
Eosinophils Absolute: 0.1 10*3/uL (ref 0.0–0.5)
Eosinophils Relative: 1 %
HCT: 45.2 % (ref 39.0–52.0)
Hemoglobin: 15.3 g/dL (ref 13.0–17.0)
Immature Granulocytes: 0 %
Lymphocytes Relative: 8 %
Lymphs Abs: 0.7 10*3/uL (ref 0.7–4.0)
MCH: 32.6 pg (ref 26.0–34.0)
MCHC: 33.8 g/dL (ref 30.0–36.0)
MCV: 96.4 fL (ref 80.0–100.0)
Monocytes Absolute: 0.7 10*3/uL (ref 0.1–1.0)
Monocytes Relative: 8 %
Neutro Abs: 6.6 10*3/uL (ref 1.7–7.7)
Neutrophils Relative %: 83 %
Platelets: 183 10*3/uL (ref 150–400)
RBC: 4.69 MIL/uL (ref 4.22–5.81)
RDW: 14.4 % (ref 11.5–15.5)
WBC: 8 10*3/uL (ref 4.0–10.5)
nRBC: 0 % (ref 0.0–0.2)

## 2021-07-23 LAB — LACTATE DEHYDROGENASE: LDH: 234 U/L — ABNORMAL HIGH (ref 98–192)

## 2021-07-23 LAB — TSH: TSH: 4.033 u[IU]/mL (ref 0.350–4.500)

## 2021-07-23 LAB — MAGNESIUM: Magnesium: 2 mg/dL (ref 1.7–2.4)

## 2021-07-23 NOTE — Progress Notes (Signed)
Logan Alvarez, [name] [id number]   CLINIC:  Medical Oncology/Hematology  PCP:  Kathyrn Drown, MD [address] [place] / [place] [address] [phone]   REASON FOR VISIT:  Follow-up for hepatocellular carcinoma with peritoneal carcinomatosis  PRIOR THERAPY: none  NGS Results: not done  CURRENT THERAPY: Atezolizumab + Bevacizumab q21d Maintenance  BRIEF ONCOLOGIC HISTORY:  Oncology History  Hepatocellular carcinoma ([place])  11/12/2017 Initial Diagnosis   Hepatocellular carcinoma ([place])   07/29/2020 Cancer Staging   Staging form: Liver, AJCC 8th Edition - Clinical stage from 07/29/2020: Stage IVB (cT1a, cN0, pM1) - Signed by Orson Slick, MD on 07/29/2020 Stage prefix: Initial diagnosis Histologic grade (G): G3 Histologic grading system: 4 grade system   08/15/2020 -  Chemotherapy   Patient is on Treatment Plan : [place] Atezolizumab + Bevacizumab q21d Maintenance       CANCER STAGING:  Cancer Staging  Hepatocellular carcinoma ([place]) Staging form: Liver, AJCC 8th Edition - Clinical stage from 07/29/2020: Stage IVB (cT1a, cN0, pM1) - Signed by Orson Slick, MD on 07/29/2020   INTERVAL HISTORY:  [name], a 66 y.o. male, returns for routine follow-up and consideration for next cycle of chemotherapy. Helmut was last seen on 06/23/2021.  Due for cycle #15 of Atezolizumab + Bevacizumab today.   Overall, he tells me he has been feeling pretty well. He denies nausea and vomiting, and he reports regular BM once daily. He reports increased flatulence. He denies abdominal pain and skin rash. He reports easy bruising on his arms. His weight is stable. He reports he has 2 beers on father's day, but he has not had any since.   Overall, he is not ready for his next cycle of chemo today.    REVIEW OF SYSTEMS:  Review of Systems  Constitutional:  Negative for appetite change, fatigue and unexpected weight change.   Gastrointestinal:  Negative for abdominal pain, constipation, diarrhea, nausea and vomiting.  Skin:  Negative for rash.  Hematological:  Bruises/bleeds easily.  Psychiatric/Behavioral:  Positive for depression and sleep disturbance. The patient is nervous/anxious.   All other systems reviewed and are negative.   PAST MEDICAL/SURGICAL HISTORY:  Past Medical History:  Diagnosis Date   Bowel obstruction ([place]) 06/2021   CAD (coronary artery disease)    a. 01/2013: cath showing nonobstructive disease. b. 11/2020: NSTEMI with DES to mid-LCx and medical management recommended of distal LCx disease given small vessel size   Hepatocellular carcinoma ([place])    Stage IV   Hyperlipidemia    Hypertension    Liver tumor    Testosterone deficiency 2009   Past Surgical History:  Procedure Laterality Date   BALLOON DILATION  07/30/2020   Procedure: BALLOON DILATION;  Surgeon: Eloise Harman, DO;  [facility];  Service: Endoscopy;;   BALLOON DILATION N/A 07/10/2021   Procedure: [place];  Surgeon: Eloise Harman, DO;  [facility];  Service: Endoscopy;  Laterality: N/A;   COLONOSCOPY N/A 03/12/2017   Procedure: COLONOSCOPY;  Surgeon: Danie Binder, MD;  [facility];  Service: Endoscopy;  Laterality: N/A;  2:00   COLONOSCOPY WITH PROPOFOL N/A 06/18/2020   Procedure: COLONOSCOPY WITH PROPOFOL;  Surgeon: Eloise Harman, DO;  [facility];  Service: Endoscopy;  Laterality: N/A;  pm appt   CORONARY STENT INTERVENTION N/A 12/03/2020   Procedure: CORONARY STENT INTERVENTION;  Surgeon: Jettie Booze, MD;  Location: [facility];  Service: Cardiovascular;  Laterality: N/A;   ESOPHAGOGASTRODUODENOSCOPY (EGD) WITH PROPOFOL N/A 07/30/2020   Procedure: ESOPHAGOGASTRODUODENOSCOPY (EGD) WITH PROPOFOL;  Surgeon: Eloise Harman, DO;  [facility];  Service: Endoscopy;  Laterality: N/A;  11:45am   ESOPHAGOGASTRODUODENOSCOPY  (EGD) WITH PROPOFOL N/A 07/10/2021   Procedure: ESOPHAGOGASTRODUODENOSCOPY (EGD) WITH PROPOFOL;  Surgeon: Eloise Harman, DO;  [facility];  Service: Endoscopy;  Laterality: N/A;  1:30pm   LEFT HEART CATH AND CORONARY ANGIOGRAPHY N/A 12/03/2020   Procedure: LEFT HEART CATH AND CORONARY ANGIOGRAPHY;  Surgeon: Jettie Booze, MD;  Location: [facility];  Service: Cardiovascular;  Laterality: N/A;   LEFT HEART CATHETERIZATION WITH CORONARY ANGIOGRAM N/A 02/01/2013   Procedure: LEFT HEART CATHETERIZATION WITH CORONARY ANGIOGRAM;  Surgeon: Josue Hector, MD;  Location: [facility] CATH LAB;  Service: Cardiovascular;  Laterality: N/A;   liver tumor removed  2020   NECK SURGERY     POLYPECTOMY  06/18/2020   Procedure: POLYPECTOMY;  Surgeon: Eloise Harman, DO;  [facility];  Service: Endoscopy;;  snare and biospy    SOCIAL HISTORY:  Social History   Socioeconomic History   Marital status: Widowed    Spouse name: Not on file   Number of children: Not on file   Years of education: Not on file   Highest education level: Not on file  Occupational History   Not on file  Tobacco Use   Smoking status: Never   Smokeless tobacco: Never  Vaping Use   Vaping Use: Never used  Substance and Sexual Activity   Alcohol use: Not Currently    Comment: 4-6 beers a day   Drug use: No   Sexual activity: Not on file  Other Topics Concern   Not on file  Social History Narrative   Not on file   Social Determinants of Health   Financial Resource Strain: Not on file  Food Insecurity: Not on file  Transportation Needs: Not on file  Physical Activity: Not on file  Stress: Not on file  Social Connections: Not on file  Intimate Partner Violence: Not on file    FAMILY HISTORY:  Family History  Problem Relation Age of Onset   Heart attack Father        49's    CURRENT MEDICATIONS:  Current Outpatient Medications  Medication Sig Dispense Refill   acidophilus  (RISAQUAD) CAPS capsule Take 1 capsule by mouth daily.     ALPRAZolam (XANAX) 0.5 MG tablet Take one tablet po QID 120 tablet 4   aspirin EC 81 MG tablet Take 1 tablet (81 mg total) by mouth daily. Swallow whole. 30 tablet 11   clopidogrel (PLAVIX) 75 MG tablet Take 1 tablet (75 mg total) by mouth daily. 90 tablet 3   docusate sodium (COLACE) 100 MG capsule Take 1 capsule (100 mg total) by mouth 2 (two) times daily. 10 capsule 0   losartan (COZAAR) 25 MG tablet TAKE 1 TABLET(25 MG) BY MOUTH IN THE MORNING 90 tablet 1   metoprolol tartrate (LOPRESSOR) 25 MG tablet Take 0.5 tablets (12.5 mg total) by mouth 2 (two) times daily. 90 tablet 3   nitroGLYCERIN (NITROSTAT) 0.4 MG SL tablet Place 1 tablet (0.4 mg total) under the tongue every 5 (five) minutes as needed for chest pain. 25 tablet 2   nystatin (MYCOSTATIN) 100000 UNIT/ML suspension Take 5 mLs (500,000 Units total) by mouth 4 (four) times daily. For 7 days 140 mL  0   oxyCODONE (OXY IR/ROXICODONE) 5 MG immediate release tablet Take 1 tablet (5 mg total) by mouth every 4 (four) hours as needed (pain). 30 tablet 0   polyethylene glycol (MIRALAX / GLYCOLAX) 17 g packet Take 17 g by mouth daily.     predniSONE (DELTASONE) 20 MG tablet Take 0.5 tablets (10 mg total) by mouth daily with breakfast. 30 tablet 1   prochlorperazine (COMPAZINE) 10 MG tablet Take 1 tablet (10 mg total) by mouth every 6 (six) hours as needed for nausea or vomiting. 30 tablet 3   [DISCONTINUED] pantoprazole (PROTONIX) 40 MG tablet Take 1 tablet (40 mg total) by mouth daily. 30 tablet 1   No current facility-administered medications for this visit.    ALLERGIES:  Allergies  Allergen Reactions   Cymbalta [Duloxetine Hcl]     Bad dreams   Lodine [Etodolac] Nausea And Vomiting   Zoloft [Sertraline Hcl]     REDUCED URINARY FLOW,NOCTURIA    PHYSICAL EXAM:  Performance status (ECOG): 1 - Symptomatic but completely ambulatory  Vitals:   07/23/21 1305  BP: 124/71   Pulse: 77  Resp: 18  Temp: (!) 97.1 F (36.2 C)  SpO2: 98%   Wt Readings from Last 3 Encounters:  07/23/21 185 lb 11.2 oz (84.2 kg)  07/08/21 180 lb (81.6 kg)  07/08/21 185 lb 12.8 oz (84.3 kg)   Physical Exam Vitals reviewed.  Constitutional:      Appearance: Normal appearance.  Cardiovascular:     Rate and Rhythm: Normal rate and regular rhythm.     Pulses: Normal pulses.     Heart sounds: Normal heart sounds.  Pulmonary:     Effort: Pulmonary effort is normal.     Breath sounds: Normal breath sounds.  Neurological:     General: No focal deficit present.     Mental Status: He is alert and oriented to person, place, and time.  Psychiatric:        Mood and Affect: Mood normal.        Behavior: Behavior normal.     LABORATORY DATA:  I have reviewed the labs as listed.     Latest Ref Rng & Units 07/23/2021   11:56 AM 07/02/2021   12:35 PM 06/23/2021   10:58 AM  CBC  WBC 4.0 - 10.5 K/uL 8.0  10.8  11.9   Hemoglobin 13.0 - 17.0 g/dL 15.3  14.7  15.2   Hematocrit 39.0 - 52.0 % 45.2  44.3  44.0   Platelets 150 - 400 K/uL 183  248  318       Latest Ref Rng & Units 07/23/2021   11:56 AM 07/02/2021   12:35 PM 06/23/2021   10:41 AM  CMP  Glucose 70 - 99 mg/dL 157  178  120   BUN 8 - 23 mg/dL '11  13  9   '$ Creatinine 0.61 - 1.24 mg/dL 0.61  0.66  0.74   Sodium 135 - 145 mmol/L 132  134  133   Potassium 3.5 - 5.1 mmol/L 4.3  4.1  3.0   Chloride 98 - 111 mmol/L 99  103  103   CO2 22 - 32 mmol/L '27  26  24   '$ Calcium 8.9 - 10.3 mg/dL 8.8  8.9  8.4   Total Protein 6.5 - 8.1 g/dL 6.8  6.9  6.6   Total Bilirubin 0.3 - 1.2 mg/dL 2.0  1.1  1.5   Alkaline Phos 38 - 126 U/L 142  66  58   AST 15 - 41 U/L 514  22  15   ALT 0 - 44 U/L 750  22  15     DIAGNOSTIC IMAGING:  I have independently reviewed the scans and discussed with the patient. No results found.   ASSESSMENT:  Hepatocellular carcinoma with peritoneal carcinomatosis: - Liver segment 5/6 partial hepatectomy on  12/08/2017-poorly differentiated hepatocellular carcinoma, 3 cm.  T1BN X- margins. - CT pancreatic protocol on 06/25/2020 at [facility] showed nodular studding and thickening of the ventral mesenteric fat suspicious for peritoneal carcinomatosis.  New pancreatic body lesion with pancreatic duct dilatation as well as right lateral abdominal wall deposit.  Found to have elevated CEA and CA 19-9. - Ultrasound-guided FNA of the right lateral abdominal wall soft tissue lesion on 06/26/2020. - Pathology consistent with metastatic HCC. - Evaluated by Dr. Lorenso Courier and started on Atezolizumab and bevacizumab on 08/15/2020. - Restaging scan on 12/05/2020 with persistent omental thickening with evidence of peritoneal metastatic disease, minimally changed since 10/04/2020.  Persistent superficial nodule along the right lateral abdomen. - We have reviewed CT of the abdomen from 01/23/2021. - There is interval progression in the hazy/nodular soft tissue thickening throughout the small bowel mesentery, omentum and paracolic gutters with no free fluid.  Cirrhosis, with subcentimeter low-attenuation lesions in the liver, too small to characterize. - Hospitalized from 06/11/2021 - 06/15/2021 with SBO. - EGD (07/10/2021): Dilatation of Schatzki ring.   Social/family history: - Lives at home.  Son lives with him.  He worked as an Pharmacist, hospital on disability currently.  Non-smoker.  Last drank alcohol last month. - Sister had bone cancer.   PLAN:  Metastatic HCC with peritoneal carcinomatosis: - He was treated with single agent Keytruda 3 weeks ago. - He denies any immunotherapy related side effects. - He had EGD on 07/10/2021.  He had Schatzki's ring in the lower part of the esophagus which was dilated.  Since then he is able to swallow and eat well. - He does not have any epigastric pain at this time. - Reviewed labs today which showed elevated LFTs more than 10 times upper limit of normal.  Hence have recommended  holding his treatment today.  CBC was normal.  TSH was 4.0.  Tumor markers are pending. - Recommend checking LFTs next week for possible treatment. - RTC 4 weeks for follow-up.  Plan to repeat CT CAP after next treatment.  2.  Constipation: - Continue Colace 2 tablets daily.  Use MiraLAX as needed.  3.  Joint pain/myalgias: - Continue prednisone 10 mg daily which is helping.  4.  Transaminitis: - He has developed elevated AST of 514 and ALT of 750 today.  He reportedly had couple of beers on Father's Day.  I have recommended to abstain from alcohol. - We will hold his treatment today and repeat LFTs next week.   Orders placed this encounter:  No orders of the defined types were placed in this encounter.    Derek Jack, MD [place] [phone]   I, Thana Ates, am acting as a scribe for Dr. Derek Jack.  I, Derek Jack MD, have reviewed the above documentation for accuracy and completeness, and I agree with the above.

## 2021-07-23 NOTE — Progress Notes (Signed)
No treatment today due to elevated LFT's per Dr. Raliegh Ip.

## 2021-07-25 LAB — CANCER ANTIGEN 19-9: CA 19-9: 29198 U/mL — ABNORMAL HIGH (ref 0–35)

## 2021-07-25 LAB — CEA: CEA: 118 ng/mL — ABNORMAL HIGH (ref 0.0–4.7)

## 2021-07-29 ENCOUNTER — Encounter (HOSPITAL_COMMUNITY): Payer: Self-pay | Admitting: Hematology

## 2021-07-29 ENCOUNTER — Inpatient Hospital Stay (HOSPITAL_COMMUNITY): Payer: Medicare Other

## 2021-07-29 ENCOUNTER — Inpatient Hospital Stay (HOSPITAL_BASED_OUTPATIENT_CLINIC_OR_DEPARTMENT_OTHER): Payer: Medicare Other | Admitting: Hematology

## 2021-07-29 VITALS — BP 109/62 | HR 67 | Temp 98.2°F | Resp 18

## 2021-07-29 DIAGNOSIS — C220 Liver cell carcinoma: Secondary | ICD-10-CM

## 2021-07-29 DIAGNOSIS — Z5111 Encounter for antineoplastic chemotherapy: Secondary | ICD-10-CM | POA: Diagnosis not present

## 2021-07-29 LAB — URINALYSIS, DIPSTICK ONLY
Bilirubin Urine: NEGATIVE
Glucose, UA: NEGATIVE mg/dL
Hgb urine dipstick: NEGATIVE
Ketones, ur: NEGATIVE mg/dL
Leukocytes,Ua: NEGATIVE
Nitrite: NEGATIVE
Protein, ur: NEGATIVE mg/dL
Specific Gravity, Urine: 1.006 (ref 1.005–1.030)
pH: 7 (ref 5.0–8.0)

## 2021-07-29 LAB — COMPREHENSIVE METABOLIC PANEL
ALT: 658 U/L — ABNORMAL HIGH (ref 0–44)
AST: 319 U/L — ABNORMAL HIGH (ref 15–41)
Albumin: 3.2 g/dL — ABNORMAL LOW (ref 3.5–5.0)
Alkaline Phosphatase: 140 U/L — ABNORMAL HIGH (ref 38–126)
Anion gap: 7 (ref 5–15)
BUN: 7 mg/dL — ABNORMAL LOW (ref 8–23)
CO2: 25 mmol/L (ref 22–32)
Calcium: 8.8 mg/dL — ABNORMAL LOW (ref 8.9–10.3)
Chloride: 97 mmol/L — ABNORMAL LOW (ref 98–111)
Creatinine, Ser: 0.54 mg/dL — ABNORMAL LOW (ref 0.61–1.24)
GFR, Estimated: >60 mL/min (ref >60)
Glucose, Bld: 166 mg/dL — ABNORMAL HIGH (ref 70–99)
Potassium: 4.4 mmol/L (ref 3.5–5.1)
Sodium: 129 mmol/L — ABNORMAL LOW (ref 135–145)
Total Bilirubin: 1.3 mg/dL — ABNORMAL HIGH (ref 0.3–1.2)
Total Protein: 6.7 g/dL (ref 6.5–8.1)

## 2021-07-29 LAB — CBC WITH DIFFERENTIAL/PLATELET
Abs Immature Granulocytes: 0.03 10*3/uL (ref 0.00–0.07)
Basophils Absolute: 0 10*3/uL (ref 0.0–0.1)
Basophils Relative: 0 %
Eosinophils Absolute: 0.1 10*3/uL (ref 0.0–0.5)
Eosinophils Relative: 1 %
HCT: 44.3 % (ref 39.0–52.0)
Hemoglobin: 15 g/dL (ref 13.0–17.0)
Immature Granulocytes: 0 %
Lymphocytes Relative: 9 %
Lymphs Abs: 0.8 10*3/uL (ref 0.7–4.0)
MCH: 32.3 pg (ref 26.0–34.0)
MCHC: 33.9 g/dL (ref 30.0–36.0)
MCV: 95.3 fL (ref 80.0–100.0)
Monocytes Absolute: 0.9 10*3/uL (ref 0.1–1.0)
Monocytes Relative: 10 %
Neutro Abs: 7.1 10*3/uL (ref 1.7–7.7)
Neutrophils Relative %: 80 %
Platelets: 192 10*3/uL (ref 150–400)
RBC: 4.65 MIL/uL (ref 4.22–5.81)
RDW: 13.8 % (ref 11.5–15.5)
WBC: 8.9 10*3/uL (ref 4.0–10.5)
nRBC: 0 % (ref 0.0–0.2)

## 2021-07-29 LAB — MAGNESIUM: Magnesium: 1.9 mg/dL (ref 1.7–2.4)

## 2021-07-29 MED ORDER — SODIUM CHLORIDE 0.9 % IV SOLN
900.0000 mg | Freq: Once | INTRAVENOUS | Status: AC
  Administered 2021-07-29: 900 mg via INTRAVENOUS
  Filled 2021-07-29: qty 32

## 2021-07-29 MED ORDER — SODIUM CHLORIDE 0.9 % IV SOLN
1200.0000 mg | Freq: Once | INTRAVENOUS | Status: AC
  Administered 2021-07-29: 1200 mg via INTRAVENOUS
  Filled 2021-07-29: qty 20

## 2021-07-29 MED ORDER — SODIUM CHLORIDE 0.9 % IV SOLN: Freq: Once | INTRAVENOUS | Status: AC

## 2021-07-29 NOTE — Progress Notes (Signed)
Pt presents today for Avastin and Tecentriq per provider's order. Vital signs and other labs WNL for treatment. Pt's AST is 319 and ALT is 658 today. Okay to proceed with treatment today per Dr.K. Molli Knock to proceed with treatment with urine protein from 06/11/21.   Avastin and Tecentriq given today per MD orders. Tolerated infusion without adverse affects. Vital signs stable. No complaints at this time. Discharged from clinic ambulatory in stable condition. Alert and oriented x 3. F/U with [facility] as scheduled.

## 2021-07-29 NOTE — Progress Notes (Signed)
Ok to proceed with Urine Protein from 06/11/21 at 100 and will receive 900 mg today as pharmacy did not anticipate treatment today with Mvasi.  T.O. Dr Carilyn Goodpasture, PharmD

## 2021-08-02 ENCOUNTER — Other Ambulatory Visit (HOSPITAL_COMMUNITY): Payer: Self-pay | Admitting: Hematology

## 2021-08-06 ENCOUNTER — Encounter: Payer: Self-pay | Admitting: Hematology

## 2021-08-07 ENCOUNTER — Inpatient Hospital Stay (HOSPITAL_COMMUNITY)
Admission: EM | Admit: 2021-08-07 | Discharge: 2021-08-13 | DRG: 389 | Disposition: A | Discharge status: Home / Self Care | Payer: Medicare Other | Attending: Internal Medicine | Admitting: Internal Medicine

## 2021-08-07 DIAGNOSIS — Z8249 Family history of ischemic heart disease and other diseases of the circulatory system: Secondary | ICD-10-CM

## 2021-08-07 DIAGNOSIS — I252 Old myocardial infarction: Secondary | ICD-10-CM

## 2021-08-07 DIAGNOSIS — Z79899 Other long term (current) drug therapy: Secondary | ICD-10-CM

## 2021-08-07 DIAGNOSIS — E291 Testicular hypofunction: Secondary | ICD-10-CM | POA: Diagnosis present

## 2021-08-07 DIAGNOSIS — K7460 Unspecified cirrhosis of liver: Secondary | ICD-10-CM | POA: Diagnosis present

## 2021-08-07 DIAGNOSIS — R7401 Elevation of levels of liver transaminase levels: Secondary | ICD-10-CM

## 2021-08-07 DIAGNOSIS — Z7952 Long term (current) use of systemic steroids: Secondary | ICD-10-CM

## 2021-08-07 DIAGNOSIS — R300 Dysuria: Secondary | ICD-10-CM | POA: Diagnosis present

## 2021-08-07 DIAGNOSIS — I10 Essential (primary) hypertension: Secondary | ICD-10-CM | POA: Diagnosis present

## 2021-08-07 DIAGNOSIS — C786 Secondary malignant neoplasm of retroperitoneum and peritoneum: Secondary | ICD-10-CM | POA: Diagnosis present

## 2021-08-07 DIAGNOSIS — K56609 Unspecified intestinal obstruction, unspecified as to partial versus complete obstruction: Secondary | ICD-10-CM | POA: Diagnosis not present

## 2021-08-07 DIAGNOSIS — C220 Liver cell carcinoma: Secondary | ICD-10-CM | POA: Diagnosis present

## 2021-08-07 DIAGNOSIS — F419 Anxiety disorder, unspecified: Secondary | ICD-10-CM | POA: Diagnosis present

## 2021-08-07 DIAGNOSIS — Z7982 Long term (current) use of aspirin: Secondary | ICD-10-CM

## 2021-08-07 DIAGNOSIS — K56600 Partial intestinal obstruction, unspecified as to cause: Secondary | ICD-10-CM | POA: Diagnosis not present

## 2021-08-07 DIAGNOSIS — Z66 Do not resuscitate: Secondary | ICD-10-CM | POA: Diagnosis present

## 2021-08-07 DIAGNOSIS — Z955 Presence of coronary angioplasty implant and graft: Secondary | ICD-10-CM

## 2021-08-07 DIAGNOSIS — Z888 Allergy status to other drugs, medicaments and biological substances status: Secondary | ICD-10-CM

## 2021-08-07 DIAGNOSIS — I2510 Atherosclerotic heart disease of native coronary artery without angina pectoris: Secondary | ICD-10-CM | POA: Diagnosis present

## 2021-08-07 DIAGNOSIS — E782 Mixed hyperlipidemia: Secondary | ICD-10-CM | POA: Diagnosis present

## 2021-08-07 DIAGNOSIS — Z7902 Long term (current) use of antithrombotics/antiplatelets: Secondary | ICD-10-CM

## 2021-08-07 DIAGNOSIS — C800 Disseminated malignant neoplasm, unspecified: Secondary | ICD-10-CM

## 2021-08-07 NOTE — ED Triage Notes (Signed)
Pt presents to Ed with c/o abd pain that started this afternoon, pt says he has had small BM today, but pain feels the same as when he had SBO. Pt also has hx of liver CA.

## 2021-08-08 ENCOUNTER — Other Ambulatory Visit: Payer: Self-pay

## 2021-08-08 ENCOUNTER — Encounter (HOSPITAL_COMMUNITY): Payer: Self-pay

## 2021-08-08 ENCOUNTER — Inpatient Hospital Stay (HOSPITAL_COMMUNITY): Payer: Medicare Other

## 2021-08-08 ENCOUNTER — Emergency Department (HOSPITAL_COMMUNITY): Payer: Medicare Other

## 2021-08-08 DIAGNOSIS — I2510 Atherosclerotic heart disease of native coronary artery without angina pectoris: Secondary | ICD-10-CM | POA: Diagnosis present

## 2021-08-08 DIAGNOSIS — R300 Dysuria: Secondary | ICD-10-CM | POA: Diagnosis present

## 2021-08-08 DIAGNOSIS — I10 Essential (primary) hypertension: Secondary | ICD-10-CM | POA: Diagnosis present

## 2021-08-08 DIAGNOSIS — I252 Old myocardial infarction: Secondary | ICD-10-CM | POA: Diagnosis not present

## 2021-08-08 DIAGNOSIS — Z8249 Family history of ischemic heart disease and other diseases of the circulatory system: Secondary | ICD-10-CM | POA: Diagnosis not present

## 2021-08-08 DIAGNOSIS — C800 Disseminated malignant neoplasm, unspecified: Secondary | ICD-10-CM | POA: Diagnosis not present

## 2021-08-08 DIAGNOSIS — E291 Testicular hypofunction: Secondary | ICD-10-CM | POA: Diagnosis present

## 2021-08-08 DIAGNOSIS — Z7952 Long term (current) use of systemic steroids: Secondary | ICD-10-CM | POA: Diagnosis not present

## 2021-08-08 DIAGNOSIS — Z515 Encounter for palliative care: Secondary | ICD-10-CM | POA: Diagnosis not present

## 2021-08-08 DIAGNOSIS — I25119 Atherosclerotic heart disease of native coronary artery with unspecified angina pectoris: Secondary | ICD-10-CM | POA: Diagnosis not present

## 2021-08-08 DIAGNOSIS — Z888 Allergy status to other drugs, medicaments and biological substances status: Secondary | ICD-10-CM | POA: Diagnosis not present

## 2021-08-08 DIAGNOSIS — Z7189 Other specified counseling: Secondary | ICD-10-CM | POA: Diagnosis not present

## 2021-08-08 DIAGNOSIS — Z955 Presence of coronary angioplasty implant and graft: Secondary | ICD-10-CM | POA: Diagnosis not present

## 2021-08-08 DIAGNOSIS — C786 Secondary malignant neoplasm of retroperitoneum and peritoneum: Secondary | ICD-10-CM | POA: Diagnosis present

## 2021-08-08 DIAGNOSIS — Z79899 Other long term (current) drug therapy: Secondary | ICD-10-CM | POA: Diagnosis not present

## 2021-08-08 DIAGNOSIS — F419 Anxiety disorder, unspecified: Secondary | ICD-10-CM | POA: Diagnosis present

## 2021-08-08 DIAGNOSIS — E782 Mixed hyperlipidemia: Secondary | ICD-10-CM | POA: Diagnosis present

## 2021-08-08 DIAGNOSIS — K7460 Unspecified cirrhosis of liver: Secondary | ICD-10-CM | POA: Diagnosis present

## 2021-08-08 DIAGNOSIS — K56600 Partial intestinal obstruction, unspecified as to cause: Secondary | ICD-10-CM | POA: Diagnosis present

## 2021-08-08 DIAGNOSIS — Z7982 Long term (current) use of aspirin: Secondary | ICD-10-CM | POA: Diagnosis not present

## 2021-08-08 DIAGNOSIS — R7401 Elevation of levels of liver transaminase levels: Secondary | ICD-10-CM

## 2021-08-08 DIAGNOSIS — K56609 Unspecified intestinal obstruction, unspecified as to partial versus complete obstruction: Secondary | ICD-10-CM

## 2021-08-08 DIAGNOSIS — Z66 Do not resuscitate: Secondary | ICD-10-CM | POA: Diagnosis present

## 2021-08-08 DIAGNOSIS — C220 Liver cell carcinoma: Secondary | ICD-10-CM | POA: Diagnosis present

## 2021-08-08 DIAGNOSIS — Z7902 Long term (current) use of antithrombotics/antiplatelets: Secondary | ICD-10-CM | POA: Diagnosis not present

## 2021-08-08 LAB — CBC WITH DIFFERENTIAL/PLATELET
Abs Immature Granulocytes: 0.04 10*3/uL (ref 0.00–0.07)
Basophils Absolute: 0 10*3/uL (ref 0.0–0.1)
Basophils Relative: 0 %
Eosinophils Absolute: 0 10*3/uL (ref 0.0–0.5)
Eosinophils Relative: 0 %
HCT: 42.9 % (ref 39.0–52.0)
Hemoglobin: 14.5 g/dL (ref 13.0–17.0)
Immature Granulocytes: 0 %
Lymphocytes Relative: 11 %
Lymphs Abs: 1.2 10*3/uL (ref 0.7–4.0)
MCH: 31.9 pg (ref 26.0–34.0)
MCHC: 33.8 g/dL (ref 30.0–36.0)
MCV: 94.3 fL (ref 80.0–100.0)
Monocytes Absolute: 1.2 10*3/uL — ABNORMAL HIGH (ref 0.1–1.0)
Monocytes Relative: 11 %
Neutro Abs: 8.3 10*3/uL — ABNORMAL HIGH (ref 1.7–7.7)
Neutrophils Relative %: 78 %
Platelets: 234 10*3/uL (ref 150–400)
RBC: 4.55 MIL/uL (ref 4.22–5.81)
RDW: 13.3 % (ref 11.5–15.5)
WBC: 10.8 10*3/uL — ABNORMAL HIGH (ref 4.0–10.5)
nRBC: 0 % (ref 0.0–0.2)

## 2021-08-08 LAB — COMPREHENSIVE METABOLIC PANEL
ALT: 226 U/L — ABNORMAL HIGH (ref 0–44)
ALT: 169 U/L — ABNORMAL HIGH (ref 0–44)
AST: 98 U/L — ABNORMAL HIGH (ref 15–41)
AST: 73 U/L — ABNORMAL HIGH (ref 15–41)
Albumin: 4 g/dL (ref 3.5–5.0)
Albumin: 3.1 g/dL — ABNORMAL LOW (ref 3.5–5.0)
Alkaline Phosphatase: 113 U/L (ref 38–126)
Alkaline Phosphatase: 89 U/L (ref 38–126)
Anion gap: 14 (ref 5–15)
Anion gap: 6 (ref 5–15)
BUN: 8 mg/dL (ref 8–23)
BUN: 7 mg/dL — ABNORMAL LOW (ref 8–23)
CO2: 23 mmol/L (ref 22–32)
CO2: 26 mmol/L (ref 22–32)
Calcium: 9.7 mg/dL (ref 8.9–10.3)
Calcium: 8.9 mg/dL (ref 8.9–10.3)
Chloride: 93 mmol/L — ABNORMAL LOW (ref 98–111)
Chloride: 98 mmol/L (ref 98–111)
Creatinine, Ser: 0.7 mg/dL (ref 0.61–1.24)
Creatinine, Ser: 0.67 mg/dL (ref 0.61–1.24)
GFR, Estimated: >60 mL/min (ref >60)
GFR, Estimated: >60 mL/min (ref >60)
Glucose, Bld: 143 mg/dL — ABNORMAL HIGH (ref 70–99)
Glucose, Bld: 127 mg/dL — ABNORMAL HIGH (ref 70–99)
Potassium: 4.3 mmol/L (ref 3.5–5.1)
Potassium: 4.6 mmol/L (ref 3.5–5.1)
Sodium: 130 mmol/L — ABNORMAL LOW (ref 135–145)
Sodium: 130 mmol/L — ABNORMAL LOW (ref 135–145)
Total Bilirubin: 2.4 mg/dL — ABNORMAL HIGH (ref 0.3–1.2)
Total Bilirubin: 1.5 mg/dL — ABNORMAL HIGH (ref 0.3–1.2)
Total Protein: 8.2 g/dL — ABNORMAL HIGH (ref 6.5–8.1)
Total Protein: 6.5 g/dL (ref 6.5–8.1)

## 2021-08-08 LAB — CBC
HCT: 49.6 % (ref 39.0–52.0)
Hemoglobin: 17.2 g/dL — ABNORMAL HIGH (ref 13.0–17.0)
MCH: 32.1 pg (ref 26.0–34.0)
MCHC: 34.7 g/dL (ref 30.0–36.0)
MCV: 92.5 fL (ref 80.0–100.0)
Platelets: 311 10*3/uL (ref 150–400)
RBC: 5.36 MIL/uL (ref 4.22–5.81)
RDW: 13.4 % (ref 11.5–15.5)
WBC: 16 10*3/uL — ABNORMAL HIGH (ref 4.0–10.5)
nRBC: 0 % (ref 0.0–0.2)

## 2021-08-08 LAB — MAGNESIUM: Magnesium: 2 mg/dL (ref 1.7–2.4)

## 2021-08-08 LAB — URINALYSIS, ROUTINE W REFLEX MICROSCOPIC
Bilirubin Urine: NEGATIVE
Glucose, UA: NEGATIVE mg/dL
Hgb urine dipstick: NEGATIVE
Ketones, ur: NEGATIVE mg/dL
Leukocytes,Ua: NEGATIVE
Nitrite: NEGATIVE
Protein, ur: NEGATIVE mg/dL
Specific Gravity, Urine: 1.02 (ref 1.005–1.030)
pH: 7 (ref 5.0–8.0)

## 2021-08-08 LAB — LIPASE, BLOOD: Lipase: 32 U/L (ref 11–51)

## 2021-08-08 MED ORDER — ACETAMINOPHEN 650 MG RE SUPP: 650.0000 mg | Freq: Four times a day (QID) | RECTAL | Status: DC | PRN

## 2021-08-08 MED ORDER — HEPARIN SODIUM (PORCINE) 5000 UNIT/ML IJ SOLN
5000.0000 [IU] | Freq: Three times a day (TID) | INTRAMUSCULAR | Status: DC
Start: 2021-08-08 — End: 2021-08-13
  Administered 2021-08-08 – 2021-08-13 (×13): 5000 [IU] via SUBCUTANEOUS
  Filled 2021-08-08 (×14): qty 1

## 2021-08-08 MED ORDER — CLOPIDOGREL BISULFATE 75 MG PO TABS
75.0000 mg | ORAL_TABLET | Freq: Every day | ORAL | Status: DC
  Administered 2021-08-08: 75 mg via ORAL
  Filled 2021-08-08: qty 1

## 2021-08-08 MED ORDER — ONDANSETRON HCL 4 MG/2ML IJ SOLN
4.0000 mg | Freq: Four times a day (QID) | INTRAMUSCULAR | Status: DC | PRN
  Administered 2021-08-10 – 2021-08-13 (×7): 4 mg via INTRAVENOUS
  Filled 2021-08-08 (×7): qty 2

## 2021-08-08 MED ORDER — MORPHINE SULFATE (PF) 2 MG/ML IV SOLN
2.0000 mg | INTRAVENOUS | Status: DC | PRN
  Administered 2021-08-08 – 2021-08-13 (×26): 2 mg via INTRAVENOUS
  Filled 2021-08-08 (×26): qty 1

## 2021-08-08 MED ORDER — MORPHINE SULFATE (PF) 4 MG/ML IV SOLN
4.0000 mg | Freq: Once | INTRAVENOUS | Status: AC
  Administered 2021-08-08: 4 mg via INTRAVENOUS
  Filled 2021-08-08: qty 1

## 2021-08-08 MED ORDER — SODIUM CHLORIDE 0.9 % IV BOLUS
1000.0000 mL | Freq: Once | INTRAVENOUS | Status: AC
  Administered 2021-08-08: 1000 mL via INTRAVENOUS

## 2021-08-08 MED ORDER — LOSARTAN POTASSIUM 50 MG PO TABS
25.0000 mg | ORAL_TABLET | Freq: Every day | ORAL | Status: DC
Start: 2021-08-08 — End: 2021-08-08
  Filled 2021-08-08: qty 1

## 2021-08-08 MED ORDER — IOHEXOL 300 MG/ML  SOLN
100.0000 mL | Freq: Once | INTRAMUSCULAR | Status: AC | PRN
  Administered 2021-08-08: 100 mL via INTRAVENOUS

## 2021-08-08 MED ORDER — MILK AND MOLASSES ENEMA
1.0000 | Freq: Once | RECTAL | Status: AC
Start: 2021-08-08 — End: 2021-08-08
  Administered 2021-08-08: 240 mL via RECTAL

## 2021-08-08 MED ORDER — SODIUM CHLORIDE 0.9 % IV SOLN: INTRAVENOUS | Status: DC

## 2021-08-08 MED ORDER — ACETAMINOPHEN 325 MG PO TABS: 650.0000 mg | ORAL_TABLET | Freq: Four times a day (QID) | ORAL | Status: DC | PRN

## 2021-08-08 MED ORDER — ALPRAZOLAM 0.5 MG PO TABS
0.5000 mg | ORAL_TABLET | Freq: Three times a day (TID) | ORAL | Status: DC | PRN
Start: 2021-08-08 — End: 2021-08-13
  Administered 2021-08-08 – 2021-08-13 (×9): 0.5 mg via ORAL
  Filled 2021-08-08 (×10): qty 1

## 2021-08-08 MED ORDER — ASPIRIN 81 MG PO TBEC
81.0000 mg | DELAYED_RELEASE_TABLET | Freq: Every day | ORAL | Status: DC
  Administered 2021-08-08 – 2021-08-13 (×6): 81 mg via ORAL
  Filled 2021-08-08 (×6): qty 1

## 2021-08-08 MED ORDER — OXYCODONE HCL 5 MG PO TABS
5.0000 mg | ORAL_TABLET | ORAL | Status: DC | PRN
  Administered 2021-08-08 – 2021-08-13 (×18): 5 mg via ORAL
  Filled 2021-08-08 (×19): qty 1

## 2021-08-08 MED ORDER — METOPROLOL TARTRATE 25 MG PO TABS
12.5000 mg | ORAL_TABLET | Freq: Two times a day (BID) | ORAL | Status: DC
  Administered 2021-08-08 – 2021-08-13 (×11): 12.5 mg via ORAL
  Filled 2021-08-08 (×11): qty 1

## 2021-08-08 MED ORDER — POLYETHYLENE GLYCOL 3350 17 G PO PACK
17.0000 g | PACK | Freq: Two times a day (BID) | ORAL | Status: DC
  Administered 2021-08-08 – 2021-08-13 (×10): 17 g via ORAL
  Filled 2021-08-08 (×10): qty 1

## 2021-08-08 MED ORDER — ONDANSETRON HCL 4 MG/2ML IJ SOLN
4.0000 mg | Freq: Once | INTRAMUSCULAR | Status: AC
  Administered 2021-08-08: 4 mg via INTRAVENOUS
  Filled 2021-08-08: qty 2

## 2021-08-08 MED ORDER — ONDANSETRON HCL 4 MG PO TABS
4.0000 mg | ORAL_TABLET | Freq: Four times a day (QID) | ORAL | Status: DC | PRN
  Administered 2021-08-13: 4 mg via ORAL
  Filled 2021-08-08: qty 1

## 2021-08-08 NOTE — ED Provider Notes (Signed)
[facility] EMERGENCY DEPARTMENT Provider Note   CSN: [id number] Arrival date & time: 08/07/21  2306     History  Chief Complaint  Patient presents with   Abdominal Pain    Hx of bowel obstruction    Logan Alvarez is a 66 y.o. male.  Patient is a 66 year old male with past medical history of metastatic liver cancer with recurrent small bowel obstructions.  He also has history of coronary artery disease, hypertension, hyperlipidemia.  He presents today with complaints of abdominal pain and vomiting.  This started earlier today.  He does report having a small bowel movement earlier today, but denies any diarrhea.  He denies any bloody stool.  He denies fevers or chills.  This feels similar to what he has experienced with small bowel obstructions in the past.  He reports being admitted within the past 2 months with a similar presentation.  The history is provided by the patient.       Home Medications Prior to Admission medications   Medication Sig Start Date End Date Taking? Authorizing Provider  acidophilus (RISAQUAD) CAPS capsule Take 1 capsule by mouth daily.    [provider]  ALPRAZolam Duanne Moron) 0.5 MG tablet Take one tablet po QID 04/29/21   Kathyrn Drown, MD  aspirin EC 81 MG tablet Take 1 tablet (81 mg total) by mouth daily. Swallow whole. 12/04/20   Kristopher Oppenheim, DO  clopidogrel (PLAVIX) 75 MG tablet Take 1 tablet (75 mg total) by mouth daily. 06/02/21   Strader, Fransisco Hertz, PA-C  docusate sodium (COLACE) 100 MG capsule Take 1 capsule (100 mg total) by mouth 2 (two) times daily. 06/15/21   Johnson, Clanford L, MD  losartan (COZAAR) 25 MG tablet TAKE 1 TABLET(25 MG) BY MOUTH IN THE MORNING 07/04/21   Kathyrn Drown, MD  metoprolol tartrate (LOPRESSOR) 25 MG tablet Take 0.5 tablets (12.5 mg total) by mouth 2 (two) times daily. 07/04/21 06/29/22  Strader, Fransisco Hertz, PA-C  nitroGLYCERIN (NITROSTAT) 0.4 MG SL tablet Place 1 tablet (0.4 mg total) under the tongue every 5 (five)  minutes as needed for chest pain. 12/13/20   Strader, Fransisco Hertz, PA-C  nystatin (MYCOSTATIN) 100000 UNIT/ML suspension Take 5 mLs (500,000 Units total) by mouth 4 (four) times daily. For 7 days 06/17/21   Kathyrn Drown, MD  oxyCODONE (OXY IR/ROXICODONE) 5 MG immediate release tablet Take 1 tablet (5 mg total) by mouth every 4 (four) hours as needed (pain). 07/08/21   Kathyrn Drown, MD  polyethylene glycol (MIRALAX / GLYCOLAX) 17 g packet Take 17 g by mouth daily.    [provider]  predniSONE (DELTASONE) 20 MG tablet TAKE 1/2 TABLET(10 MG) BY MOUTH DAILY WITH BREAKFAST 08/06/21   Derek Jack, MD  prochlorperazine (COMPAZINE) 10 MG tablet Take 1 tablet (10 mg total) by mouth every 6 (six) hours as needed for nausea or vomiting. 05/08/21   Derek Jack, MD  pantoprazole (PROTONIX) 40 MG tablet Take 1 tablet (40 mg total) by mouth daily. 07/10/21 07/10/22  Eloise Harman, DO      Allergies    Cymbalta [duloxetine hcl], Lodine [etodolac], and Zoloft [sertraline hcl]    Review of Systems   Review of Systems  All other systems reviewed and are negative.   Physical Exam Updated Vital Signs BP 124/71   Pulse 81   Temp 97.7 F (36.5 C) (Oral)   Resp (!) 22   Ht '5\' 8"'$  (1.727 m)   Wt 83.7 kg  SpO2 100%   BMI 28.06 kg/m  Physical Exam Vitals and nursing note reviewed.  Constitutional:      General: He is not in acute distress.    Appearance: He is well-developed. He is not diaphoretic.  HENT:     Head: Normocephalic and atraumatic.  Cardiovascular:     Rate and Rhythm: Normal rate and regular rhythm.     Heart sounds: No murmur heard.    No friction rub.  Pulmonary:     Effort: Pulmonary effort is normal. No respiratory distress.     Breath sounds: Normal breath sounds. No wheezing or rales.  Abdominal:     General: Bowel sounds are normal. There is no distension.     Palpations: Abdomen is soft.     Tenderness: There is generalized abdominal tenderness.  There is no right CVA tenderness, left CVA tenderness, guarding or rebound.  Musculoskeletal:        General: Normal range of motion.     Cervical back: Normal range of motion and neck supple.  Skin:    General: Skin is warm and dry.  Neurological:     Mental Status: He is alert and oriented to person, place, and time.     Coordination: Coordination normal.     ED Results / Procedures / Treatments   Labs (all labs ordered are listed, but only abnormal results are displayed) Labs Reviewed  COMPREHENSIVE METABOLIC PANEL - Abnormal; Notable for the following components:      Result Value   Sodium 130 (*)    Chloride 93 (*)    Glucose, Bld 143 (*)    Total Protein 8.2 (*)    AST 98 (*)    ALT 226 (*)    Total Bilirubin 2.4 (*)    All other components within normal limits  CBC - Abnormal; Notable for the following components:   WBC 16.0 (*)    Hemoglobin 17.2 (*)    All other components within normal limits  LIPASE, BLOOD  URINALYSIS, ROUTINE W REFLEX MICROSCOPIC    EKG None  Radiology No results found.  Procedures Procedures    Medications Ordered in ED Medications  sodium chloride 0.9 % bolus 1,000 mL (has no administration in time range)  morphine (PF) 4 MG/ML injection 4 mg (has no administration in time range)  ondansetron (ZOFRAN) injection 4 mg (has no administration in time range)    ED Course/ Medical Decision Making/ A&P  This patient presents to the ED for concern of abdominal pain, this involves an extensive number of treatment options, and is a complaint that carries with it a high risk of complications and morbidity.  The differential diagnosis includes small bowel obstruction, cholecystitis, pancreatitis   Co morbidities that complicate the patient evaluation  History of metastatic liver cancer   Additional history obtained:  No additional history or external records needed   Lab Tests:  I Ordered, and personally interpreted labs.  The  pertinent results include: Leukocytosis with white count of 16,000 and modest elevations of his LFTs, but laboratory studies otherwise unremarkable.   Imaging Studies ordered:  I ordered imaging studies including CT scan of the abdomen and pelvis I independently visualized and interpreted imaging which showed small bowel obstruction I agree with the radiologist interpretation   Cardiac Monitoring: / EKG:  The patient was maintained on a cardiac monitor.  I personally viewed and interpreted the cardiac monitored which showed an underlying rhythm of: Sinus rhythm   Consultations Obtained:  I  requested consultation with the hospitalist,  and discussed lab and imaging findings as well as pertinent plan - they recommend: Admit for pain control and nausea control   Problem List / ED Course / Critical interventions / Medication management  Patient presenting with abdominal pain.  He has history of liver cancer and recurrent small bowel obstructions.  His presentation is most consistent with a small bowel obstruction and this was confirmed by CT scan.  He is feeling better after receiving medicine for pain and nausea, but I feel will require admission until he is able to pass gas/stool.  I have spoken with the hospitalist who agrees to admit. I ordered medication including morphine for pain and Zofran for nausea Reevaluation of the patient after these medicines showed that the patient improved I have reviewed the patients home medicines and have made adjustments as needed   Social Determinants of Health:  None   Test / Admission - Considered:  Patient to be admitted for further management of small bowel obstruction   Final Clinical Impression(s) / ED Diagnoses Final diagnoses:  None    Rx / DC Orders ED Discharge Orders     None         Veryl Speak, MD 08/08/21 [phone]

## 2021-08-08 NOTE — Progress Notes (Signed)
Patient seen and examined; admitted after midnight with complaints of abd pain, nausea and vomiting. Past medical history significant for hepatocellular carcinoma with metastasis and carcinomatosis. Work up demonstrating SBO from carcinomatosis with transition point. Hemodynamically stable currently. Please refer to H&P written by Dr. Clearence Ped for further info/details on admission.   Plan: -will follow general surgery recommendations. -continue IVF's, supportive care, electrolytes repletion and analgesics. -will follow clinical response.  Barton Dubois MD [phone]

## 2021-08-08 NOTE — Assessment & Plan Note (Signed)
-  Last immunotherapy treatment 2 weeks ago, next immunotherapy treatment in 1 week. -Continue to follow up with oncology as an outpatient.

## 2021-08-08 NOTE — Assessment & Plan Note (Addendum)
-   No chest pain -Continue the use of aspirin, ARB, beta-blocker and Plavix.

## 2021-08-08 NOTE — Assessment & Plan Note (Addendum)
-   Continue to avoid/minimize hepatotoxic agents -Follow LFTs.

## 2021-08-08 NOTE — H&P (Signed)
History and Physical    Patient: Logan Alvarez:[id number] DOB: 1955/09/10 DOA: 08/07/2021 DOS: the patient was seen and examined on 08/08/2021 PCP: Kathyrn Drown, MD  Patient coming from: Home  Chief Complaint:  Chief Complaint  Patient presents with   Abdominal Pain    Hx of bowel obstruction   HPI: Logan Alvarez is a 66 y.o. male with medical history significant of history of SBO, carcinomatosis 2/2 hepatocellular carcinoma, coronary artery disease, hyperlipidemia, hypertension, and more presents the ED with a chief complaint of abdominal pain.  Patient reports that the pain started in the right lower quadrant and radiated diffusely.  It started around 7 PM.  He describes it is severe and cramping.  It was intermittent.  He has associated nausea and vomiting with 2 episodes of emesis that were nonbloody.  Patient felt flushed every time he had the cramping.  He had no diarrhea.  His last normal bowel movement was that morning.  He reports that he takes laxatives daily to stay regular.  He did feel constipated throughout the day afterwards, trying to evacuate his bowels without success.  His last normal meal was dinner on the day of presentation.  Patient reports that he had small breakfast with a bowl of cereal, small lunch with a leftover sweet potato, but then he started feeling queasy and thought it was because he had not eaten enough.  Senyene swings are chicken for dinner.  He had flatulence and belching.  He reports that he felt his stomach cramping and could hear grumbling.  The pain was too much for him to wait through the night so he came to the ED.  Patient reports that the last time this happened was in May, but he been doing well since then.  Patient does have a history of hepatocellular carcinoma with carcinomatosis and gets immunotherapy with Dr. Delton Coombes.  Patient is a previous drinker, but reports that he has stopped since he started treatment because his liver enzymes kept  going up.  Patient does not drink, does not smoke, does not use illicit drugs.  He is vaccinated for COVID.  Patient is DNR. Review of Systems: As mentioned in the history of present illness. All other systems reviewed and are negative. Past Medical History:  Diagnosis Date   Bowel obstruction ([place]) 06/2021   CAD (coronary artery disease)    a. 01/2013: cath showing nonobstructive disease. b. 11/2020: NSTEMI with DES to mid-LCx and medical management recommended of distal LCx disease given small vessel size   Hepatocellular carcinoma ([place])    Stage IV   Hyperlipidemia    Hypertension    Liver tumor    Testosterone deficiency 2009   Past Surgical History:  Procedure Laterality Date   BALLOON DILATION  07/30/2020   Procedure: BALLOON DILATION;  Surgeon: Eloise Harman, DO;  [facility];  Service: Endoscopy;;   BALLOON DILATION N/A 07/10/2021   Procedure: [place];  Surgeon: Eloise Harman, DO;  [facility];  Service: Endoscopy;  Laterality: N/A;   COLONOSCOPY N/A 03/12/2017   Procedure: COLONOSCOPY;  Surgeon: Danie Binder, MD;  [facility]: [facility];  Service: Endoscopy;  Laterality: N/A;  2:00   COLONOSCOPY WITH PROPOFOL N/A 06/18/2020   Procedure: COLONOSCOPY WITH PROPOFOL;  Surgeon: Eloise Harman, DO;  [facility];  Service: Endoscopy;  Laterality: N/A;  pm appt   CORONARY STENT INTERVENTION N/A 12/03/2020   Procedure: CORONARY STENT INTERVENTION;  Surgeon:  Jettie Booze, MD;  Location: [place] [facility];  Service: Cardiovascular;  Laterality: N/A;   ESOPHAGOGASTRODUODENOSCOPY (EGD) WITH PROPOFOL N/A 07/30/2020   Procedure: ESOPHAGOGASTRODUODENOSCOPY (EGD) WITH PROPOFOL;  Surgeon: Eloise Harman, DO;  [facility];  Service: Endoscopy;  Laterality: N/A;  11:45am   ESOPHAGOGASTRODUODENOSCOPY (EGD) WITH PROPOFOL N/A 07/10/2021   Procedure: ESOPHAGOGASTRODUODENOSCOPY (EGD) WITH PROPOFOL;  Surgeon: Eloise Harman, DO;  [facility];  Service: Endoscopy;  Laterality: N/A;  1:30pm   LEFT HEART CATH AND CORONARY ANGIOGRAPHY N/A 12/03/2020   Procedure: LEFT HEART CATH AND CORONARY ANGIOGRAPHY;  Surgeon: Jettie Booze, MD;  Location: [place] [facility];  Service: Cardiovascular;  Laterality: N/A;   LEFT HEART CATHETERIZATION WITH CORONARY ANGIOGRAM N/A 02/01/2013   Procedure: LEFT HEART CATHETERIZATION WITH CORONARY ANGIOGRAM;  Surgeon: Josue Hector, MD;  Location: [facility];  Service: Cardiovascular;  Laterality: N/A;   liver tumor removed  2020   NECK SURGERY     POLYPECTOMY  06/18/2020   Procedure: POLYPECTOMY;  Surgeon: Eloise Harman, DO;  [facility];  Service: Endoscopy;;  snare and biospy   Social History:  reports that he has never smoked. He has never used smokeless tobacco. He reports that he does not currently use alcohol. He reports that he does not use drugs.  Allergies  Allergen Reactions   Cymbalta [Duloxetine Hcl]     Bad dreams   Lodine [Etodolac] Nausea And Vomiting   Zoloft [Sertraline Hcl]     REDUCED URINARY FLOW,NOCTURIA    Family History  Problem Relation Age of Onset   Heart attack Father        60's    Prior to Admission medications   Medication Sig Start Date End Date Taking? Authorizing Provider  acidophilus (RISAQUAD) CAPS capsule Take 1 capsule by mouth daily.    [provider]  ALPRAZolam Duanne Moron) 0.5 MG tablet Take one tablet po QID 04/29/21   Kathyrn Drown, MD  aspirin EC 81 MG tablet Take 1 tablet (81 mg total) by mouth daily. Swallow whole. 12/04/20   Kristopher Oppenheim, DO  clopidogrel (PLAVIX) 75 MG tablet Take 1 tablet (75 mg total) by mouth daily. 06/02/21   Strader, Fransisco Hertz, PA-C  docusate sodium (COLACE) 100 MG capsule Take 1 capsule (100 mg total) by mouth 2 (two) times daily. 06/15/21   Johnson, Clanford L, MD  losartan (COZAAR) 25 MG tablet TAKE 1 TABLET(25 MG) BY MOUTH IN THE MORNING 07/04/21   Kathyrn Drown, MD  metoprolol tartrate (LOPRESSOR) 25 MG tablet Take 0.5 tablets (12.5 mg total) by mouth 2 (two) times daily. 07/04/21 06/29/22  Strader, Fransisco Hertz, PA-C  nitroGLYCERIN (NITROSTAT) 0.4 MG SL tablet Place 1 tablet (0.4 mg total) under the tongue every 5 (five) minutes as needed for chest pain. 12/13/20   Strader, Fransisco Hertz, PA-C  nystatin (MYCOSTATIN) 100000 UNIT/ML suspension Take 5 mLs (500,000 Units total) by mouth 4 (four) times daily. For 7 days 06/17/21   Kathyrn Drown, MD  oxyCODONE (OXY IR/ROXICODONE) 5 MG immediate release tablet Take 1 tablet (5 mg total) by mouth every 4 (four) hours as needed (pain). 07/08/21   Kathyrn Drown, MD  polyethylene glycol (MIRALAX / GLYCOLAX) 17 g packet Take 17 g by mouth daily.    [provider]  predniSONE (DELTASONE) 20 MG tablet TAKE 1/2 TABLET(10 MG) BY MOUTH DAILY WITH BREAKFAST 08/06/21   Derek Jack, MD  prochlorperazine (COMPAZINE)  10 MG tablet Take 1 tablet (10 mg total) by mouth every 6 (six) hours as needed for nausea or vomiting. 05/08/21   Derek Jack, MD  pantoprazole (PROTONIX) 40 MG tablet Take 1 tablet (40 mg total) by mouth daily. 07/10/21 07/10/22  Eloise Harman, DO    Physical Exam: Vitals:   08/08/21 0402 08/08/21 0404 08/08/21 0430 08/08/21 0540  BP: 125/87 125/87 115/74 112/69  Pulse: 88 88 77 75  Resp: '17 18 13   '$ Temp:  98 F (36.7 C)  97.9 F (36.6 C)  TempSrc:  Oral    SpO2: 100% 100% 98% 99%  Weight:      Height:       1.  General: Patient lying supine in bed,  no acute distress   2. Psychiatric: Alert and oriented x 3, mood and behavior normal for situation, pleasant and cooperative with exam   3. Neurologic: Speech and language are normal, face is symmetric, moves all 4 extremities voluntarily, at baseline without acute deficits on limited exam   4. HEENMT:  Head is atraumatic, normocephalic, pupils reactive to light, neck is supple, trachea is midline, mucous membranes are  moist   5. Respiratory : Lungs are clear to auscultation bilaterally without wheezing, rhonchi, rales, no cyanosis, no increase in work of breathing or accessory muscle use   6. Cardiovascular : Heart rate normal, rhythm is regular, no murmurs, rubs or gallops, no peripheral edema, peripheral pulses palpated   7. Gastrointestinal:  Abdomen is soft, mildly distended, nontender to palpation at the time of my exam, bowel sounds active, no masses or organomegaly palpated   8. Skin:  Skin is warm, dry and intact without rashes, acute lesions, or ulcers on limited exam   9.Musculoskeletal:  No acute deformities or trauma, no asymmetry in tone, no peripheral edema, peripheral pulses palpated, no tenderness to palpation in the extremities  Data Reviewed: In the ED Temp 97.7, heart rate 77-81, respiratory rate 14-22, blood pressure 91/53-124/71, satting at 97% Leukocytosis at 16, hemoglobin 17.2, platelets 311 Chemistry is unremarkable aside from the transaminitis at ALT 226 and AST 98 Lipase is 28, T. bili 2.4 Patient received 2 doses of morphine in the ED which mostly resolved his pain, he has Zofran, 1 L normal saline, and a normal UA CT abdomen pelvis showed SBO Admission was requested for management of SBO  Assessment and Plan: * SBO (small bowel obstruction) (HCC) - Abdominal pain, nausea, vomiting -CT abdomen pelvis shows distal small bowel obstruction with transition zone in the right lower quadrant -N.p.o. -Trend KUB in the a.m. -Consult general surgery -Pain control with pain scale -Continue to monitor  Transaminitis - AST 98 this is down from 319, ALT 226 this is down from 658 -Avoid hepatotoxic agents when possible -Continue to trend  Carcinomatosis (HCC) - Last immunotherapy treatment 2 weeks ago, next immunotherapy treatment in 1 week -Likely the etiology for recurrent SBO's -Follows with Dr. Delton Coombes  CAD (coronary artery disease) - Continue aspirin, Plavix,  ARB, beta-blocker -No statin due to transaminitis  Anxiety - Continue Xanax  Mixed hyperlipidemia - No statin given history of recurrent transaminitis/cirrhosis  HTN (hypertension) - Continue ARB and beta-blocker      Advance Care Planning:   Code Status: DNR   Consults: General surgery  Family Communication: No family at bedside  Severity of Illness: The appropriate patient status for this patient is INPATIENT. Inpatient status is judged to be reasonable and necessary in order to provide the required  intensity of service to ensure the patient's safety. The patient's presenting symptoms, physical exam findings, and initial radiographic and laboratory data in the context of their chronic comorbidities is felt to place them at high risk for further clinical deterioration. Furthermore, it is not anticipated that the patient will be medically stable for discharge from the hospital within 2 midnights of admission.   * I certify that at the point of admission it is my clinical judgment that the patient will require inpatient hospital care spanning beyond 2 midnights from the point of admission due to high intensity of service, high risk for further deterioration and high frequency of surveillance required.*  Author: Rolla Plate, DO 08/08/2021 5:52 AM  For on call review [email or web address].

## 2021-08-08 NOTE — Consult Note (Signed)
[facility] Consult  Reason for Consult: SBO Referring Physician: ED  Chief Complaint   Abdominal Pain     HPI: Logan Alvarez is a 66 y.o. male with PMHx of SBO, carcinomatosis 2/2 hepatocellular carcinoma, coronary artery disease, hyperlipidemia, hypertension present for evaluation of abdominal pain.Patient reports the sudden onset of intermittent lower abdominal pain that began last at 1900 last night. He describes the pain as a cramping sensation that is worse when laying flat. States the pain does radiate to his LUQ. Notes he only ate a small amount yesterday before the pain began with his last meal being around 1700. Endorses a single episode of nausea and vomiting while at home. Also notes he did feel diaphoretic when the pain began. Reports he has felts similar pain with small bowel obstruction in the past. Denies any diarrhea, headache, dizziness, fever, chills, increased urination, urinary retention, or dysuria. At present patient states the pain is still present but has improved since he was seen in the ED. Last bowel movement was yesterday afternoon.   Past Medical History:  Diagnosis Date   Bowel obstruction ([place]) 06/2021   CAD (coronary artery disease)    a. 01/2013: cath showing nonobstructive disease. b. 11/2020: NSTEMI with DES to mid-LCx and medical management recommended of distal LCx disease given small vessel size   Hepatocellular carcinoma ([place])    Stage IV   Hyperlipidemia    Hypertension    Liver tumor    Testosterone deficiency 2009    Past Surgical History:  Procedure Laterality Date   BALLOON DILATION  07/30/2020   Procedure: BALLOON DILATION;  Surgeon: Eloise Harman, DO;  [facility];  Service: Endoscopy;;   BALLOON DILATION N/A 07/10/2021   Procedure: [place];  Surgeon: Eloise Harman, DO;  [facility];  Service: Endoscopy;  Laterality: N/A;   COLONOSCOPY N/A 03/12/2017   Procedure: COLONOSCOPY;   Surgeon: Danie Binder, MD;  [facility];  Service: Endoscopy;  Laterality: N/A;  2:00   COLONOSCOPY WITH PROPOFOL N/A 06/18/2020   Procedure: COLONOSCOPY WITH PROPOFOL;  Surgeon: Eloise Harman, DO;  [facility];  Service: Endoscopy;  Laterality: N/A;  pm appt   CORONARY STENT INTERVENTION N/A 12/03/2020   Procedure: CORONARY STENT INTERVENTION;  Surgeon: Jettie Booze, MD;  Location: [place] [facility];  Service: Cardiovascular;  Laterality: N/A;   ESOPHAGOGASTRODUODENOSCOPY (EGD) WITH PROPOFOL N/A 07/30/2020   Procedure: ESOPHAGOGASTRODUODENOSCOPY (EGD) WITH PROPOFOL;  Surgeon: Eloise Harman, DO;  [facility];  Service: Endoscopy;  Laterality: N/A;  11:45am   ESOPHAGOGASTRODUODENOSCOPY (EGD) WITH PROPOFOL N/A 07/10/2021   Procedure: ESOPHAGOGASTRODUODENOSCOPY (EGD) WITH PROPOFOL;  Surgeon: Eloise Harman, DO;  [facility];  Service: Endoscopy;  Laterality: N/A;  1:30pm   LEFT HEART CATH AND CORONARY ANGIOGRAPHY N/A 12/03/2020   Procedure: LEFT HEART CATH AND CORONARY ANGIOGRAPHY;  Surgeon: Jettie Booze, MD;  Location: [place] [facility];  Service: Cardiovascular;  Laterality: N/A;   LEFT HEART CATHETERIZATION WITH CORONARY ANGIOGRAM N/A 02/01/2013   Procedure: LEFT HEART CATHETERIZATION WITH CORONARY ANGIOGRAM;  Surgeon: Josue Hector, MD;  Location[facility] CATH LAB;  Service: Cardiovascular;  Laterality: N/A;   liver tumor removed  2020   NECK SURGERY     POLYPECTOMY  06/18/2020   Procedure: POLYPECTOMY;  Surgeon: Eloise Harman, DO;  [facility];  Service: Endoscopy;;  snare and biospy    Family History  Problem  Relation Age of Onset   Heart attack Father        103's    Social History   Tobacco Use   Smoking status: Never   Smokeless tobacco: Never  Vaping Use   Vaping Use: Never used  Substance Use Topics   Alcohol use: Not Currently    Comment: 4-6 beers a day   Drug use: No     Medications: I have reviewed the patient's current medications.  Allergies  Allergen Reactions   Cymbalta [Duloxetine Hcl]     Bad dreams   Lodine [Etodolac] Nausea And Vomiting   Zoloft [Sertraline Hcl]     REDUCED URINARY FLOW,NOCTURIA     ROS:  Pertinent items are noted in HPI.  Blood pressure 112/69, pulse 75, temperature 97.9 F (36.6 C), resp. rate 13, height '5\' 8"'$  (1.727 m), weight 83.7 kg, SpO2 99 %. Physical Exam Constitutional:      Appearance: He is well-developed.  HENT:     Head: Normocephalic and atraumatic.  Eyes:     Extraocular Movements: Extraocular movements intact.  Cardiovascular:     Rate and Rhythm: Normal rate and regular rhythm.  Pulmonary:     Effort: Pulmonary effort is normal.     Breath sounds: Normal breath sounds.  Abdominal:     Tenderness: There is abdominal tenderness in the left upper quadrant.  Skin:    General: Skin is warm and dry.  Neurological:     General: No focal deficit present.     Mental Status: He is alert and oriented to person, place, and time.  Psychiatric:        Mood and Affect: Mood normal.        Behavior: Behavior normal.     Results: Results for orders placed or performed during the hospital encounter of 08/07/21 (from the past 48 hour(s))  Lipase, blood     Status: None   Collection Time: 08/08/21 12:18 AM  Result Value Ref Range   Lipase 32 11 - 51 U/L    Comment: Performed at [facility], [address] [place], [place] [address]  Comprehensive metabolic panel     Status: Abnormal   Collection Time: 08/08/21 12:18 AM  Result Value Ref Range   Sodium 130 (L) 135 - 145 mmol/L   Potassium 4.3 3.5 - 5.1 mmol/L   Chloride 93 (L) 98 - 111 mmol/L   CO2 23 22 - 32 mmol/L   Glucose, Bld 143 (H) 70 - 99 mg/dL    Comment: Glucose reference range applies only to samples taken after fasting for at least 8 hours.   BUN 8 8 - 23 mg/dL   Creatinine, Ser 0.70 0.61 - 1.24 mg/dL   Calcium 9.7 8.9 - 10.3 mg/dL    Total Protein 8.2 (H) 6.5 - 8.1 g/dL   Albumin 4.0 3.5 - 5.0 g/dL   AST 98 (H) 15 - 41 U/L   ALT 226 (H) 0 - 44 U/L   Alkaline Phosphatase 113 38 - 126 U/L   Total Bilirubin 2.4 (H) 0.3 - 1.2 mg/dL   GFR, Estimated >60 >60 mL/min    Comment: (NOTE) Calculated using the CKD-EPI Creatinine Equation (2021)    Anion gap 14 5 - 15    Comment: Performed at [facility], [address], [place], [place] [address]  CBC     Status: Abnormal   Collection Time: 08/08/21 12:18 AM  Result Value Ref Range   WBC 16.0 (H) 4.0 - 10.5  K/uL   RBC 5.36 4.22 - 5.81 MIL/uL   Hemoglobin 17.2 (H) 13.0 - 17.0 g/dL   HCT 49.6 39.0 - 52.0 %   MCV 92.5 80.0 - 100.0 fL   MCH 32.1 26.0 - 34.0 pg   MCHC 34.7 30.0 - 36.0 g/dL   RDW 13.4 11.5 - 15.5 %   Platelets 311 150 - 400 K/uL   nRBC 0.0 0.0 - 0.2 %    Comment: Performed at [facility], [address], [place], [place] [address]  Urinalysis, Routine w reflex microscopic Urine, Clean Catch     Status: None   Collection Time: 08/08/21  3:32 AM  Result Value Ref Range   Color, Urine YELLOW YELLOW   APPearance CLEAR CLEAR   Specific Gravity, Urine 1.020 1.005 - 1.030   pH 7.0 5.0 - 8.0   Glucose, UA NEGATIVE NEGATIVE mg/dL   Hgb urine dipstick NEGATIVE NEGATIVE   Bilirubin Urine NEGATIVE NEGATIVE   Ketones, ur NEGATIVE NEGATIVE mg/dL   Protein, ur NEGATIVE NEGATIVE mg/dL   Nitrite NEGATIVE NEGATIVE   Leukocytes,Ua NEGATIVE NEGATIVE    Comment: Performed at [facility], [address], [place], [place] [address]  Comprehensive metabolic panel     Status: Abnormal   Collection Time: 08/08/21  5:57 AM  Result Value Ref Range   Sodium 130 (L) 135 - 145 mmol/L   Potassium 4.6 3.5 - 5.1 mmol/L   Chloride 98 98 - 111 mmol/L   CO2 26 22 - 32 mmol/L   Glucose, Bld 127 (H) 70 - 99 mg/dL    Comment: Glucose reference range applies only to samples taken after fasting for at least 8 hours.   BUN 7 (L) 8 - 23 mg/dL   Creatinine, Ser 0.67 0.61 - 1.24 mg/dL    Calcium 8.9 8.9 - 10.3 mg/dL   Total Protein 6.5 6.5 - 8.1 g/dL   Albumin 3.1 (L) 3.5 - 5.0 g/dL   AST 73 (H) 15 - 41 U/L   ALT 169 (H) 0 - 44 U/L   Alkaline Phosphatase 89 38 - 126 U/L   Total Bilirubin 1.5 (H) 0.3 - 1.2 mg/dL   GFR, Estimated >60 >60 mL/min    Comment: (NOTE) Calculated using the CKD-EPI Creatinine Equation (2021)    Anion gap 6 5 - 15    Comment: Performed at [facility], [address] [place], [place] [address]  Magnesium     Status: None   Collection Time: 08/08/21  5:57 AM  Result Value Ref Range   Magnesium 2.0 1.7 - 2.4 mg/dL    Comment: Performed at [facility], [address] [place], [place] [address]  CBC with Differential/Platelet     Status: Abnormal   Collection Time: 08/08/21  5:57 AM  Result Value Ref Range   WBC 10.8 (H) 4.0 - 10.5 K/uL   RBC 4.55 4.22 - 5.81 MIL/uL   Hemoglobin 14.5 13.0 - 17.0 g/dL   HCT 42.9 39.0 - 52.0 %   MCV 94.3 80.0 - 100.0 fL   MCH 31.9 26.0 - 34.0 pg   MCHC 33.8 30.0 - 36.0 g/dL   RDW 13.3 11.5 - 15.5 %   Platelets 234 150 - 400 K/uL   nRBC 0.0 0.0 - 0.2 %   Neutrophils Relative % 78 %   Neutro Abs 8.3 (H) 1.7 - 7.7 K/uL   Lymphocytes Relative 11 %   Lymphs Abs 1.2 0.7 - 4.0 K/uL   Monocytes Relative 11 %  Monocytes Absolute 1.2 (H) 0.1 - 1.0 K/uL   Eosinophils Relative 0 %   Eosinophils Absolute 0.0 0.0 - 0.5 K/uL   Basophils Relative 0 %   Basophils Absolute 0.0 0.0 - 0.1 K/uL   Immature Granulocytes 0 %   Abs Immature Granulocytes 0.04 0.00 - 0.07 K/uL    Comment: Performed at [facility], [address] [place], [place] [address]    Abd 1 View (KUB)  Result Date: 08/08/2021 CLINICAL DATA:  Abdominal pain. EXAM: ABDOMEN - 1 VIEW COMPARISON:  KUB, 06/13/2021.  CT AP 08/08/2021. FINDINGS: Multiple air-and-fluid distended loops of small bowel scattered within abdomen. No radiographic evidence of free intraperitoneal air. Mild distention of urinary bladder with previously administered contrast. No  interval osseous abnormality. . IMPRESSION: Findings compatible with small-bowel obstruction. Electronically Signed   By: Michaelle Birks M.D.   On: 08/08/2021 07:03   CT ABDOMEN PELVIS W CONTRAST  Result Date: 08/08/2021 CLINICAL DATA:  Abdominal pain. EXAM: CT ABDOMEN AND PELVIS WITH CONTRAST TECHNIQUE: Multidetector CT imaging of the abdomen and pelvis was performed using the standard protocol following bolus administration of intravenous contrast. RADIATION DOSE REDUCTION: This exam was performed according to the departmental dose-optimization program which includes automated exposure control, adjustment of the mA and/or kV according to patient size and/or use of iterative reconstruction technique. CONTRAST:  160m OMNIPAQUE IOHEXOL 300 MG/ML  SOLN COMPARISON:  Jun 11, 2021 FINDINGS: Lower chest: No acute abnormality. Hepatobiliary: The liver is cirrhotic in appearance. A 6 mm focus of parenchymal low attenuation is seen within the anterior aspect of the left lobe. The gallbladder is not identified. There is no evidence of biliary dilatation. Pancreas: Unremarkable. No pancreatic ductal dilatation or surrounding inflammatory changes. Spleen: Normal in size without focal abnormality. Adrenals/Urinary Tract: Adrenal glands are unremarkable. Kidneys are normal in size, without renal calculi or hydronephrosis. A 1.4 cm diameter simple cyst is seen within the lower pole of the right kidney. No additional follow-up or imaging is recommended. Mild, stable predominant dorsal and anterior urinary bladder wall thickening is seen. Stomach/Bowel: Stomach is within normal limits. The appendix is not clearly identified. Multiple dilated small bowel loops are seen throughout the mid and lower abdomen (maximum small bowel diameter of approximately 3.1 cm). A transition zone is seen within the anterolateral aspect of the right lower quadrant (axial CT images 56-59, CT series 2). The small bowel loops seen distal to the  transition zone are completely decompressed. Vascular/Lymphatic: Aortic atherosclerosis. No enlarged abdominal or pelvic lymph nodes. Reproductive: Prostate is unremarkable. Other: No abdominal wall hernia or abnormality. A mild amount of abdominopelvic free fluid is seen. This is mildly increased in severity when compared to the prior study. Diffuse soft tissue thickening and nodularity is again seen throughout the peritoneum and omentum. Musculoskeletal: A chronic compression fracture deformity is seen at the level of L1. A stable sclerotic focus is seen within the left iliac wing. Degenerative changes seen throughout the lumbar spine. IMPRESSION: 1. Distal small bowel obstruction with a transition zone seen within the right lower quadrant. 2. Stable findings consistent with peritoneal carcinomatosis. 3. Mild amount of abdominopelvic free fluid, mildly increased in severity when compared to the prior study. 4. Hepatic cirrhosis. 5. Chronic compression fracture deformity at the level of L1. Aortic Atherosclerosis (ICD10-I70.0). Electronically Signed   By: TVirgina NorfolkM.D.   On: 08/08/2021 03:26     Assessment & Plan:  RALEKSI BRUMMETis a 66y.o. male with a PMHx of carcinomatosis  2/2 hepatocellular carcinoma presenting for a one day history of acute onset abdominal pain.   1) SBO - Patient with abdomina pain, nausea, and vomiting with CT revealing distal small  bowel obstruction with a transition zone seen within the right lower quadrant -  Findings are consistent with peritoneal carcinomatosis - Will provide enema to promote peristalsis and reevaluate in the morning   All questions were answered to the satisfaction of the patient.   Patient was agreeable to this plan.    Marlan Palau 08/08/2021, 9:30 AM

## 2021-08-08 NOTE — Assessment & Plan Note (Addendum)
-  No statins will be used given history of cirrhosis and transaminitis.

## 2021-08-08 NOTE — Assessment & Plan Note (Signed)
-  Stable overall -Continue current antihypertensive agents.

## 2021-08-08 NOTE — Progress Notes (Signed)
Initial Nutrition Assessment  DOCUMENTATION CODES:      INTERVENTION:  When diet advances -Ensure Plus High Protein po BID, each supplement provides 350 kcal and 20 grams of protein.   NUTRITION DIAGNOSIS:   Inadequate oral intake related to inability to eat as evidenced by NPO status, per patient/family report (nausea and vomiting prior to admission).   GOAL:  Patient will meet greater than or equal to 90% of their needs   MONITOR:  Diet advancement, Labs, PO intake, Weight trends, Supplement acceptance  REASON FOR ASSESSMENT:   Malnutrition Screening Tool    ASSESSMENT: Patient is a 66 yo male with hx of SBO, hepatocellular carcinoma with metastasis and carcinomatosis, CAD, HTN who presents with abdominal pain-right lower quadrant, nausea and vomiting.  Patient has been seen by surgery and no acute surgical intervention planned.  Patient is NPO. Patient usually eats 3 meals daily and snacks as desired. He likes to eat smaller meals spread out through the day. Patient drinks High Protein Ensure daily. Feeding himself at baseline.   Patient reports 3 small BM's yesterday.   Weights reviewed- no significant changes the past 2 months. He reports weight has been holding in the 180-183 lb range. Last year in August he weighed 215 lb according to [place] RD note. Overall loss of 14% for the time period.   Medications: milk of molasses enema x1.  IVF-NS'@75ml'$ /hr.     Latest Ref Rng & Units 08/08/2021    5:57 AM 08/08/2021   12:18 AM 07/29/2021   11:04 AM  BMP  Glucose 70 - 99 mg/dL 127  143  166   BUN 8 - 23 mg/dL '7  8  7   '$ Creatinine 0.61 - 1.24 mg/dL 0.67  0.70  0.54   Sodium 135 - 145 mmol/L 130  130  129   Potassium 3.5 - 5.1 mmol/L 4.6  4.3  4.4   Chloride 98 - 111 mmol/L 98  93  97   CO2 22 - 32 mmol/L '26  23  25   '$ Calcium 8.9 - 10.3 mg/dL 8.9  9.7  8.8       NUTRITION - FOCUSED PHYSICAL EXAM: Unable to complete Nutrition-Focused physical exam at this time.      Diet Order:   Diet Order             Diet NPO time specified Except for: Ice Chips, Sips with Meds  Diet effective now                   EDUCATION NEEDS:  No education needs have been identified at this time  Skin:  Skin Assessment: Reviewed RN Assessment  Last BM:  7/6 bloating  Height:   Ht Readings from Last 1 Encounters:  08/08/21 '5\' 8"'$  (1.727 m)    Weight:   Wt Readings from Last 1 Encounters:  08/08/21 83.7 kg    Ideal Body Weight:   70 kg  BMI:  Body mass index is 28.06 kg/m.  Estimated Nutritional Needs:   Kcal:  1920-2180  Protein:  95-98 gr  Fluid:  2 liters daily  Colman Cater MS,RD,CSG,LDN Contact:AMION

## 2021-08-08 NOTE — Assessment & Plan Note (Signed)
-   Abdominal pain, nausea, vomiting -CT abdomen pelvis shows distal small bowel obstruction with transition zone in the right lower quadrant -N.p.o. -Trend KUB in the a.m. -Consult general surgery -Pain control with pain scale -Continue to monitor

## 2021-08-08 NOTE — Assessment & Plan Note (Signed)
-   Continue home anxiolytic regimen.

## 2021-08-08 NOTE — Progress Notes (Signed)
  Transition of Care [facility]) Screening Note   Patient Details  Name: Logan Alvarez Date of Birth: [birth year]   Transition of Care [facility]) CM/SW Contact:    Ihor Gully, LCSW Phone Number: 08/08/2021, 1:19 PM    Transition of Care Department [facility]) has reviewed patient and no TOC needs have been identified at this time. We will continue to monitor patient advancement through interdisciplinary progression rounds. If new patient transition needs arise, please place a TOC consult.

## 2021-08-09 DIAGNOSIS — K56609 Unspecified intestinal obstruction, unspecified as to partial versus complete obstruction: Secondary | ICD-10-CM | POA: Diagnosis not present

## 2021-08-09 DIAGNOSIS — R7401 Elevation of levels of liver transaminase levels: Secondary | ICD-10-CM

## 2021-08-09 DIAGNOSIS — F419 Anxiety disorder, unspecified: Secondary | ICD-10-CM

## 2021-08-09 DIAGNOSIS — I10 Essential (primary) hypertension: Secondary | ICD-10-CM

## 2021-08-09 DIAGNOSIS — E782 Mixed hyperlipidemia: Secondary | ICD-10-CM

## 2021-08-09 DIAGNOSIS — I25119 Atherosclerotic heart disease of native coronary artery with unspecified angina pectoris: Secondary | ICD-10-CM

## 2021-08-09 DIAGNOSIS — C800 Disseminated malignant neoplasm, unspecified: Secondary | ICD-10-CM | POA: Diagnosis not present

## 2021-08-09 MED ORDER — BISACODYL 10 MG RE SUPP
10.0000 mg | Freq: Every day | RECTAL | Status: DC
  Administered 2021-08-10 – 2021-08-13 (×2): 10 mg via RECTAL
  Filled 2021-08-09 (×3): qty 1

## 2021-08-09 NOTE — Progress Notes (Signed)
Progress Note   Patient: Logan Alvarez EXH:[id number] DOB: 07/01/1955 DOA: 08/07/2021     1 DOS: the patient was seen and examined on 08/09/2021   Brief hospital admission course: As per H&P written by Dr. Clearence Ped on 08/08/21 Logan Alvarez is a 66 y.o. male with medical history significant of history of SBO, carcinomatosis 2/2 hepatocellular carcinoma, coronary artery disease, hyperlipidemia, hypertension, and more presents the ED with a chief complaint of abdominal pain.  Patient reports that the pain started in the right lower quadrant and radiated diffusely.  It started around 7 PM.  He describes it is severe and cramping.  It was intermittent.  He has associated nausea and vomiting with 2 episodes of emesis that were nonbloody.  Patient felt flushed every time he had the cramping.  He had no diarrhea.  His last normal bowel movement was that morning.  He reports that he takes laxatives daily to stay regular.  He did feel constipated throughout the day afterwards, trying to evacuate his bowels without success.  His last normal meal was dinner on the day of presentation.  Patient reports that he had small breakfast with a bowl of cereal, small lunch with a leftover sweet potato, but then he started feeling queasy and thought it was because he had not eaten enough.  Senyene swings are chicken for dinner.  He had flatulence and belching.  He reports that he felt his stomach cramping and could hear grumbling.  The pain was too much for him to wait through the night so he came to the ED.  Patient reports that the last time this happened was in May, but he been doing well since then.  Patient does have a history of hepatocellular carcinoma with carcinomatosis and gets immunotherapy with Dr. Delton Coombes.  Patient is a previous drinker, but reports that he has stopped since he started treatment because his liver enzymes kept going up.   Patient does not drink, does not smoke, does not use illicit drugs.  He is  vaccinated for COVID.  Patient is DNR.  Assessment and Plan: * SBO (small bowel obstruction) (HCC) - Abdominal pain, nausea and vomiting reported at time of admission. -CT abdomen pelvis shows distal small bowel obstruction with transition zone in the right lower quadrant -In the setting of carcinomatosis. -Continue conservative management; continue n.p.o. status except for sips with meds and ice chips.. -Follow recommendations by general surgery; patient most likely will benefit of small bowel through images. -Maintain adequate hydration, replete electrolytes and provide analgesics/antiemetics.  Transaminitis - Continue to avoid/minimize hepatotoxic agents -Follow LFTs.  Carcinomatosis ([place]) -Last immunotherapy treatment 2 weeks ago, next immunotherapy treatment in 1 week. -Continue to follow up with oncology as an outpatient.  CAD (coronary artery disease) - No chest pain -Continue the use of aspirin, ARB, beta-blocker and Plavix.  Anxiety - Continue home anxiolytic regimen.  Mixed hyperlipidemia -Heart healthy diet discussed with patient -No statins will be used given history of cirrhosis and transaminitis.   HTN (hypertension) - Stable overall -Continue current antihypertensive agents.   Subjective:  Small liquid bowel movement after enema and MiraLAX; still complaining of some cramping abdominal discomfort.  Reports no flatus.  No nausea or vomiting at this point.  Continue to be n.p.o.  Physical Exam: Vitals:   08/08/21 1959 08/08/21 2137 08/09/21 0535 08/09/21 1329  BP:  127/82 131/81 131/74  Pulse:  74 74 80  Resp:  18  18  Temp:  99.7 F (37.6 C) 98.8  F (37.1 C) 99.5 F (37.5 C)  TempSrc:    Oral  SpO2: 98% 99% 98% 100%  Weight:      Height:       General exam: Alert, awake, oriented x 3; no nausea, no vomiting.  Still reporting intermittent cramping abdominal discomfort.  Had small loose watery stool in the morning after the use of MiraLAX.  No  flatus. Respiratory system: Clear to auscultation. Respiratory effort normal.  Good saturation on room air. Cardiovascular system:RRR.  No rubs, no gallops, no JVD. Gastrointestinal system: Abdomen is mildly distended; soft and mildly tender to palpation in his mid abdomen and lower quadrants.  Decreased but present bowel sounds (mainly on the left side).  No guarding. Central nervous system: Alert and oriented. No focal neurological deficits. Extremities: No cyanosis or clubbing. Skin: No petechiae Psychiatry: Judgement and insight appear normal. Mood & affect appropriate.   Data Reviewed: Complete metabolic panel demonstrating sodium 130, potassium 4.6, chloride 98, BUN 7, creatinine 0.67; AST 73 and ALT 169. Magnesium 2.0 CBC: WBCs 10.8, hemoglobin 14.5 and platelet count 234 K   Family Communication: Daughter at bedside.  Disposition: Status is: Inpatient Remains inpatient appropriate because: Still has not achieved resolution of SBO.   Planned Discharge Destination: Home   Author: Barton Dubois, MD 08/09/2021 5:30 PM  For on call review [email or web address].

## 2021-08-09 NOTE — Progress Notes (Signed)
Subjective: Patient did have results with milk of molasses enema.  No further bowel movements noted.  Still having some abdominal cramping.  Minimal flatus.  No emesis.  Objective: Vital signs in last 24 hours: Temp:  [98.2 F (36.8 C)-99.7 F (37.6 C)] 98.8 F (37.1 C) (07/08 0535) Pulse Rate:  [65-74] 74 (07/08 0535) Resp:  [18] 18 (07/07 2137) BP: (123-131)/(72-82) 131/81 (07/08 0535) SpO2:  [97 %-100 %] 98 % (07/08 0535) Last BM Date : 08/08/21  Intake/Output from previous day: 07/07 0701 - 07/08 0700 In: 1720.9 [I.V.:1720.9] Out: 500 [Urine:500] Intake/Output this shift: No intake/output data recorded.  General appearance: alert, cooperative, and no distress GI: Soft with distention.  This is not tense.  Minimal bowel sounds appreciated.  No point tenderness noted.  Lab Results:  Recent Labs    08/08/21 0018 08/08/21 0557  WBC 16.0* 10.8*  HGB 17.2* 14.5  HCT 49.6 42.9  PLT 311 234   BMET Recent Labs    08/08/21 0018 08/08/21 0557  NA 130* 130*  K 4.3 4.6  CL 93* 98  CO2 23 26  GLUCOSE 143* 127*  BUN 8 7*  CREATININE 0.70 0.67  CALCIUM 9.7 8.9   PT/INR No results for input(s): "LABPROT", "INR" in the last 72 hours.  Studies/Results: Abd 1 View ([facility]UB)  Result Date: 08/08/2021 CLINICAL DATA:  Abdominal pain. EXAM: ABDOMEN - 1 VIEW COMPARISON:  [facility], 06/13/2021.  CT AP 08/08/2021. FINDINGS: Multiple air-and-fluid distended loops of small bowel scattered within abdomen. No radiographic evidence of free intraperitoneal air. Mild distention of urinary bladder with previously administered contrast. No interval osseous abnormality. . IMPRESSION: Findings compatible with small-bowel obstruction. Electronically Signed   By: Michaelle Birks M.D.   On: 08/08/2021 07:03   CT ABDOMEN PELVIS W CONTRAST  Result Date: 08/08/2021 CLINICAL DATA:  Abdominal pain. EXAM: CT ABDOMEN AND PELVIS WITH CONTRAST TECHNIQUE: Multidetector CT imaging of the abdomen and pelvis was  performed using the standard protocol following bolus administration of intravenous contrast. RADIATION DOSE REDUCTION: This exam was performed according to the departmental dose-optimization program which includes automated exposure control, adjustment of the mA and/or kV according to patient size and/or use of iterative reconstruction technique. CONTRAST:  1101m OMNIPAQUE IOHEXOL 300 MG/ML  SOLN COMPARISON:  Jun 11, 2021 FINDINGS: Lower chest: No acute abnormality. Hepatobiliary: The liver is cirrhotic in appearance. A 6 mm focus of parenchymal low attenuation is seen within the anterior aspect of the left lobe. The gallbladder is not identified. There is no evidence of biliary dilatation. Pancreas: Unremarkable. No pancreatic ductal dilatation or surrounding inflammatory changes. Spleen: Normal in size without focal abnormality. Adrenals/Urinary Tract: Adrenal glands are unremarkable. Kidneys are normal in size, without renal calculi or hydronephrosis. A 1.4 cm diameter simple cyst is seen within the lower pole of the right kidney. No additional follow-up or imaging is recommended. Mild, stable predominant dorsal and anterior urinary bladder wall thickening is seen. Stomach/Bowel: Stomach is within normal limits. The appendix is not clearly identified. Multiple dilated small bowel loops are seen throughout the mid and lower abdomen (maximum small bowel diameter of approximately 3.1 cm). A transition zone is seen within the anterolateral aspect of the right lower quadrant (axial CT images 56-59, CT series 2). The small bowel loops seen distal to the transition zone are completely decompressed. Vascular/Lymphatic: Aortic atherosclerosis. No enlarged abdominal or pelvic lymph nodes. Reproductive: Prostate is unremarkable. Other: No abdominal wall hernia or abnormality. A mild amount of abdominopelvic free fluid is  seen. This is mildly increased in severity when compared to the prior study. Diffuse soft tissue  thickening and nodularity is again seen throughout the peritoneum and omentum. Musculoskeletal: A chronic compression fracture deformity is seen at the level of L1. A stable sclerotic focus is seen within the left iliac wing. Degenerative changes seen throughout the lumbar spine. IMPRESSION: 1. Distal small bowel obstruction with a transition zone seen within the right lower quadrant. 2. Stable findings consistent with peritoneal carcinomatosis. 3. Mild amount of abdominopelvic free fluid, mildly increased in severity when compared to the prior study. 4. Hepatic cirrhosis. 5. Chronic compression fracture deformity at the level of L1. Aortic Atherosclerosis (ICD10-I70.0). Electronically Signed   By: Virgina Norfolk M.D.   On: 08/08/2021 03:26    Anti-infectives: Anti-infectives (From admission, onward)    None       Assessment/Plan: Impression: Small bowel obstruction versus dysmotility. Plan: No need for acute surgical invention at this time.  Continue current management.  LOS: 1 day    Aviva Signs 08/09/2021

## 2021-08-10 DIAGNOSIS — K56609 Unspecified intestinal obstruction, unspecified as to partial versus complete obstruction: Secondary | ICD-10-CM | POA: Diagnosis not present

## 2021-08-10 DIAGNOSIS — C800 Disseminated malignant neoplasm, unspecified: Secondary | ICD-10-CM | POA: Diagnosis not present

## 2021-08-10 DIAGNOSIS — F419 Anxiety disorder, unspecified: Secondary | ICD-10-CM | POA: Diagnosis not present

## 2021-08-10 DIAGNOSIS — I25119 Atherosclerotic heart disease of native coronary artery with unspecified angina pectoris: Secondary | ICD-10-CM | POA: Diagnosis not present

## 2021-08-10 LAB — CBC
HCT: 40.1 % (ref 39.0–52.0)
Hemoglobin: 13.6 g/dL (ref 13.0–17.0)
MCH: 32.3 pg (ref 26.0–34.0)
MCHC: 33.9 g/dL (ref 30.0–36.0)
MCV: 95.2 fL (ref 80.0–100.0)
Platelets: 169 10*3/uL (ref 150–400)
RBC: 4.21 MIL/uL — ABNORMAL LOW (ref 4.22–5.81)
RDW: 13.2 % (ref 11.5–15.5)
WBC: 8.5 10*3/uL (ref 4.0–10.5)
nRBC: 0 % (ref 0.0–0.2)

## 2021-08-10 LAB — BASIC METABOLIC PANEL
Anion gap: 7 (ref 5–15)
BUN: 8 mg/dL (ref 8–23)
CO2: 23 mmol/L (ref 22–32)
Calcium: 8.3 mg/dL — ABNORMAL LOW (ref 8.9–10.3)
Chloride: 100 mmol/L (ref 98–111)
Creatinine, Ser: 0.52 mg/dL — ABNORMAL LOW (ref 0.61–1.24)
GFR, Estimated: >60 mL/min (ref >60)
Glucose, Bld: 72 mg/dL (ref 70–99)
Potassium: 3.8 mmol/L (ref 3.5–5.1)
Sodium: 130 mmol/L — ABNORMAL LOW (ref 135–145)

## 2021-08-10 LAB — URINALYSIS, ROUTINE W REFLEX MICROSCOPIC
Bilirubin Urine: NEGATIVE
Glucose, UA: NEGATIVE mg/dL
Hgb urine dipstick: NEGATIVE
Ketones, ur: 80 mg/dL — AB
Leukocytes,Ua: NEGATIVE
Nitrite: NEGATIVE
Protein, ur: NEGATIVE mg/dL
Specific Gravity, Urine: 1.018 (ref 1.005–1.030)
pH: 5 (ref 5.0–8.0)

## 2021-08-10 LAB — MAGNESIUM: Magnesium: 1.9 mg/dL (ref 1.7–2.4)

## 2021-08-10 NOTE — Progress Notes (Signed)
Pt c/o pain all throughout this writers shift. Pain does subside with PRN meds, but comes back after meds begin to wear off. Pt started liquid diet this am and has ate small amounts per meal, but the pain remains unchanged unless he has the pain meds. Pt last bm was 0300, he has had scheduled Miralax and suppository this shift with no bm's since. Pt is passing gas and burping. Will continue to monitor this pt.

## 2021-08-10 NOTE — Progress Notes (Signed)
Progress Note   Patient: Logan Alvarez:[id number] DOB: August 23, 1955 DOA: 08/07/2021     2 DOS: the patient was seen and examined on 08/10/2021   Brief hospital admission course: As per H&P written by Dr. Clearence Ped on 08/08/21 Logan Alvarez is a 66 y.o. male with medical history significant of history of SBO, carcinomatosis 2/2 hepatocellular carcinoma, coronary artery disease, hyperlipidemia, hypertension, and more presents the ED with a chief complaint of abdominal pain.  Patient reports that the pain started in the right lower quadrant and radiated diffusely.  It started around 7 PM.  He describes it is severe and cramping.  It was intermittent.  He has associated nausea and vomiting with 2 episodes of emesis that were nonbloody.  Patient felt flushed every time he had the cramping.  He had no diarrhea.  His last normal bowel movement was that morning.  He reports that he takes laxatives daily to stay regular.  He did feel constipated throughout the day afterwards, trying to evacuate his bowels without success.  His last normal meal was dinner on the day of presentation.  Patient reports that he had small breakfast with a bowl of cereal, small lunch with a leftover sweet potato, but then he started feeling queasy and thought it was because he had not eaten enough.  Senyene swings are chicken for dinner.  He had flatulence and belching.  He reports that he felt his stomach cramping and could hear grumbling.  The pain was too much for him to wait through the night so he came to the ED.  Patient reports that the last time this happened was in May, but he been doing well since then.  Patient does have a history of hepatocellular carcinoma with carcinomatosis and gets immunotherapy with Dr. Delton Coombes.  Patient is a previous drinker, but reports that he has stopped since he started treatment because his liver enzymes kept going up.   Patient does not drink, does not smoke, does not use illicit drugs.  He is  vaccinated for COVID.  Patient is DNR.  Assessment and Plan: * SBO (small bowel obstruction) (HCC) - Abdominal pain, nausea and vomiting reported at time of admission. -CT abdomen pelvis shows distal small bowel obstruction with transition zone in the right lower quadrant -In the setting of carcinomatosis. -Continue conservative management; continue n.p.o. status except for sips with meds and ice chips.. -Follow recommendations by general surgery; patient most likely will benefit of small bowel through images; especially now that he is experiencing still some abdominal discomfort and nausea symptoms after Clear liquid diet attempted. -Continue to maintain adequate hydration, replete electrolytes and provide analgesics/antiemetics.  Transaminitis - Continue to avoid/minimize hepatotoxic agents -Follow LFTs.  Carcinomatosis ([place]) -Last immunotherapy treatment 2 weeks ago, next immunotherapy treatment in 1 week. -Continue to follow up with oncology as an outpatient.  CAD (coronary artery disease) - No chest pain -Continue the use of aspirin, ARB, beta-blocker and Plavix.  Anxiety - Continue home anxiolytic regimen.  Mixed hyperlipidemia -Heart healthy diet discussed with patient -No statins will be used given history of cirrhosis and transaminitis.   HTN (hypertension) - Stable overall -Continue current antihypertensive agents.  Dysuria: -As present patient's urine appearance to be dark brown in color. -Urinalysis and culture has been requested. -Follow results. -Continue to maintain adequate hydration. -Normal WBCs and no fever; holding antibiotics at this moment.  Subjective:  Partially tolerating liquid diet; still having some intermittent abdominal cramps and nausea.  Passing gas and has  started moving his bowels with the use of MiraLAX.  Patient reports dysuria.  Physical Exam: Vitals:   08/09/21 1329 08/09/21 2120 08/10/21 0555 08/10/21 1354  BP: 131/74 137/73 (!)  157/82 128/74  Pulse: 80 86 89 77  Resp: 18     Temp: 99.5 F (37.5 C) 98.3 F (36.8 C) 99.7 F (37.6 C) 98.9 F (37.2 C)  TempSrc: Oral Oral Oral Oral  SpO2: 100% 99% 98% 98%  Weight:      Height:       General exam: Alert, awake, oriented x 3; no fever, no chest pain, no shortness of breath.  Reports intermittent abdominal cramps and some nausea after Clear liquid diet started.  Passing gas and had moved bowels with the use of MiraLAX. Respiratory system: Clear to auscultation. Respiratory effort normal.  Good oxygen saturation on room air. Cardiovascular system:RRR. No rubs or gallops; no JVD. Gastrointestinal system: Abdomen is mildly distended, positive bowel sounds appreciated on exam.  Reports mild tenderness to deep palpation in his mid and right lower quadrant.  No guarding Central nervous system: Alert and oriented. No focal neurological deficits. Extremities: No cyanosis or clubbing. Skin: No petechiae. Psychiatry: Judgement and insight appear normal. Mood & affect appropriate.    Data Reviewed: CBC: White blood cells 8.5, hemoglobin 13.6, platelet count 169 K Sodium 130, potassium 3.8, chloride 100, bicarb 23, BUN 8, creatinine 0.52 Magnesium 1.9  Family Communication: Daughter at bedside.  Disposition: Status is: Inpatient Remains inpatient appropriate because: Still has not achieved resolution of SBO.   Planned Discharge Destination: Home   Author: Barton Dubois, MD 08/10/2021 3:12 PM  For on call review [email or web address].

## 2021-08-10 NOTE — Progress Notes (Signed)
  Subjective: Patient has had multiple watery bowel movements over the past 24 hours.  His abdominal cramping is easing.  He is passing flatus.  Objective: Vital signs in last 24 hours: Temp:  [98.3 F (36.8 C)-99.7 F (37.6 C)] 99.7 F (37.6 C) (07/09 0555) Pulse Rate:  [80-89] 89 (07/09 0555) Resp:  [18] 18 (07/08 1329) BP: (131-157)/(73-82) 157/82 (07/09 0555) SpO2:  [98 %-100 %] 98 % (07/09 0555) Last BM Date : 08/09/21  Intake/Output from previous day: No intake/output data recorded. Intake/Output this shift: No intake/output data recorded.  General appearance: alert, cooperative, and no distress GI: Soft with minimal distention noted.  Occasional bowel sounds appreciated.  No tenderness or rigidity noted.  Lab Results:  Recent Labs    08/08/21 0557 08/10/21 0615  WBC 10.8* 8.5  HGB 14.5 13.6  HCT 42.9 40.1  PLT 234 169   BMET Recent Labs    08/08/21 0557 08/10/21 0615  NA 130* 130*  K 4.6 3.8  CL 98 100  CO2 26 23  GLUCOSE 127* 72  BUN 7* 8  CREATININE 0.67 0.52*  CALCIUM 8.9 8.3*   PT/INR No results for input(s): "LABPROT", "INR" in the last 72 hours.  Studies/Results: No results found.  Anti-infectives: Anti-infectives (From admission, onward)    None       Assessment/Plan: Impression: Partial small bowel obstruction secondary to carcinomatosis.  This appears to be easing.  No need for gastrostomy tube placement at the present time.  Will advance to full liquid diet.  Continue MiraLAX.  LOS: 2 days    Aviva Signs 08/10/2021

## 2021-08-11 DIAGNOSIS — F419 Anxiety disorder, unspecified: Secondary | ICD-10-CM | POA: Diagnosis not present

## 2021-08-11 DIAGNOSIS — I25119 Atherosclerotic heart disease of native coronary artery with unspecified angina pectoris: Secondary | ICD-10-CM | POA: Diagnosis not present

## 2021-08-11 DIAGNOSIS — C800 Disseminated malignant neoplasm, unspecified: Secondary | ICD-10-CM | POA: Diagnosis not present

## 2021-08-11 DIAGNOSIS — K56609 Unspecified intestinal obstruction, unspecified as to partial versus complete obstruction: Secondary | ICD-10-CM | POA: Diagnosis not present

## 2021-08-11 LAB — HEPATIC FUNCTION PANEL
ALT: 80 U/L — ABNORMAL HIGH (ref 0–44)
AST: 39 U/L (ref 15–41)
Albumin: 2.9 g/dL — ABNORMAL LOW (ref 3.5–5.0)
Alkaline Phosphatase: 78 U/L (ref 38–126)
Bilirubin, Direct: 0.6 mg/dL — ABNORMAL HIGH (ref 0.0–0.2)
Indirect Bilirubin: 1.9 mg/dL — ABNORMAL HIGH (ref 0.3–0.9)
Total Bilirubin: 2.5 mg/dL — ABNORMAL HIGH (ref 0.3–1.2)
Total Protein: 6.1 g/dL — ABNORMAL LOW (ref 6.5–8.1)

## 2021-08-11 MED ORDER — PROCHLORPERAZINE EDISYLATE 10 MG/2ML IJ SOLN
10.0000 mg | Freq: Four times a day (QID) | INTRAMUSCULAR | Status: DC | PRN
  Administered 2021-08-11 – 2021-08-13 (×5): 10 mg via INTRAVENOUS
  Filled 2021-08-11 (×5): qty 2

## 2021-08-11 MED ORDER — PROCHLORPERAZINE EDISYLATE 10 MG/2ML IJ SOLN: 10.0000 mg | Freq: Four times a day (QID) | INTRAMUSCULAR | Status: DC | PRN

## 2021-08-11 NOTE — Progress Notes (Signed)
Patient refused dulcolax suppository and heparin injection. MD Dyann Kief made aware.

## 2021-08-11 NOTE — Progress Notes (Signed)
Progress Note   Patient: Logan Alvarez DOB: May 21, 1955 DOA: 08/07/2021     3 DOS: the patient was seen and examined on 08/11/2021   Brief hospital admission course: As per H&P written by Dr. Clearence Ped on 08/08/21 Logan Alvarez is a 66 y.o. male with medical history significant of history of SBO, carcinomatosis 2/2 hepatocellular carcinoma, coronary artery disease, hyperlipidemia, hypertension, and more presents the ED with a chief complaint of abdominal pain.  Patient reports that the pain started in the right lower quadrant and radiated diffusely.  It started around 7 PM.  He describes it is severe and cramping.  It was intermittent.  He has associated nausea and vomiting with 2 episodes of emesis that were nonbloody.  Patient felt flushed every time he had the cramping.  He had no diarrhea.  His last normal bowel movement was that morning.  He reports that he takes laxatives daily to stay regular.  He did feel constipated throughout the day afterwards, trying to evacuate his bowels without success.  His last normal meal was dinner on the day of presentation.  Patient reports that he had small breakfast with a bowl of cereal, small lunch with a leftover sweet potato, but then he started feeling queasy and thought it was because he had not eaten enough.  Senyene swings are chicken for dinner.  He had flatulence and belching.  He reports that he felt his stomach cramping and could hear grumbling.  The pain was too much for him to wait through the night so he came to the ED.  Patient reports that the last time this happened was in May, but he been doing well since then.  Patient does have a history of hepatocellular carcinoma with carcinomatosis and gets immunotherapy with Dr. Delton Coombes.  Patient is a previous drinker, but reports that he has stopped since he started treatment because his liver enzymes kept going up.   Patient does not drink, does not smoke, does not use illicit drugs.  He is  vaccinated for COVID.  Patient is DNR.  Assessment and Plan: * SBO (small bowel obstruction) (HCC) - Abdominal pain, nausea and vomiting reported at time of admission. -CT abdomen pelvis shows distal small bowel obstruction with transition zone in the right lower quadrant -In the setting of carcinomatosis. -Continue conservative management; continue to follow recommendations by general surgery; patient most likely will benefit of small bowel through images if he fails to progress to. -Planning to advance diet to soft consistency.   -Continue to maintain adequate hydration, replete electrolytes and provide analgesics/antiemetics.  Transaminitis - Continue to avoid/minimize hepatotoxic agents -Follow LFTs.  Carcinomatosis ([place]) -Last immunotherapy treatment 2 weeks ago, next immunotherapy treatment in 1 week. -Continue to follow up with oncology as an outpatient.  CAD (coronary artery disease) - No chest pain -Continue the use of aspirin, ARB, beta-blocker and Plavix.  Anxiety - Continue home anxiolytic regimen.  Mixed hyperlipidemia -Heart healthy diet discussed with patient -No statins will be used given history of cirrhosis and transaminitis.   HTN (hypertension) -Stable overall -Continue current antihypertensive agents.  Dysuria: -Improved; urinalysis not suggesting infection -Urine culture pending.  -Patient is afebrile and with normal WBCs.  Subjective:  Continue reporting liquid bowel movements overnight; having some intermittent nausea and cramping pain.  Overall the pain is easing off today.  No vomiting.  Patient is afebrile.  Physical Exam: Vitals:   08/10/21 2204 08/11/21 0456 08/11/21 0924 08/11/21 1426  BP: (!) 151/85 140/84 Marland Kitchen)  150/82 121/66  Pulse: 91 94 83 78  Resp: '20 20  16  '$ Temp: 99.9 F (37.7 C) 98.4 F (36.9 C)  99.3 F (37.4 C)  TempSrc: Oral   Oral  SpO2: 99% 98%  97%  Weight:      Height:      General exam: Alert, awake, oriented x 3;  reporting abdominal pain easing off after pain medication.  Having multiple bowel movements overnight (liquid stool); positive intermittent nausea without vomiting. Respiratory system: Clear to auscultation. Respiratory effort normal.  Good saturation on room air. Cardiovascular system:RRR. No murmurs, rubs, gallops.  No JVD. Gastrointestinal system: Abdomen is mildly distended; demonstrating no guarding.  Slight tender to deep palpation in lower quadrant aspects.  Positive bowel sounds.   Central nervous system: Alert and oriented. No focal neurological deficits. Extremities: No C/C/E, +pedal pulses Skin: No rashes, no petechiae. Psychiatry: Judgement and insight appear normal. Mood & affect appropriate.    Data Reviewed: LFTs: Total protein 6.1, albumin 2.9, AST 39, ALT 80, total bilirubin 2.5, alkaline phosphatase 78. Urinalysis not demonstrating signs of acute infection, negative protein, nitrite leukocyte esterase. Urine culture pending.   Family Communication: No family at bedside.  Disposition: Status is: Inpatient Remains inpatient appropriate because: Still has not achieved resolution of SBO.   Planned Discharge Destination: Home   Author: Barton Dubois, MD 08/11/2021 2:47 PM  For on call review [email or web address].

## 2021-08-11 NOTE — Progress Notes (Signed)
.  [facility] Progress Note     Subjective: Patient reports easing of his abdominal pain. States he had a small bowel movement at midnight. Reports he is tolerating liquids and denies any vomiting.   Objective: Vital signs in last 24 hours: Temp:  [98.4 F (36.9 C)-99.9 F (37.7 C)] 98.4 F (36.9 C) (07/10 0456) Pulse Rate:  [77-94] 94 (07/10 0456) Resp:  [20] 20 (07/10 0456) BP: (128-151)/(74-85) 140/84 (07/10 0456) SpO2:  [98 %-99 %] 98 % (07/10 0456) Last BM Date : 08/10/21  Intake/Output from previous day: No intake/output data recorded. Intake/Output this shift: No intake/output data recorded.  Physical Exam General: Resting comfortably. NAD Cardiovascular: RRR. No murmurs, rubs, or gallops Respiratory: Clear to ausculation bilaterally GI: Soft, non-tender. Mildly distended. Occasional bowel sounds.   Lab Results:  Recent Labs    08/10/21 0615  WBC 8.5  HGB 13.6  HCT 40.1  PLT 169   BMET Recent Labs    08/10/21 0615  NA 130*  K 3.8  CL 100  CO2 23  GLUCOSE 72  BUN 8  CREATININE 0.52*  CALCIUM 8.3*   PT/INR No results for input(s): "LABPROT", "INR" in the last 72 hours.  Studies/Results: No results found.  Anti-infectives: Anti-infectives (From admission, onward)    None       Assessment/Plan: Impression:  - Partial small bowel obstruction secondary to carcinomatosis.  - PRN for pain  - Full liquid diet. Pt has had bowel movement within last 24 hours.  - There is still no need for gastrostomy tube placement at the present time.   LOS: 3 days    Marlan Palau 08/11/2021

## 2021-08-11 NOTE — Progress Notes (Signed)
Patient has complained of continual pain this shift.  PRN medications given per MD order.  Emotional support given to patient. Vitals stable. Patient was able to tolerated liquids. Patient did complain of gas pains and unable to have bowel movement this shift.  Assessed patient, bowel sounds are audible.

## 2021-08-12 ENCOUNTER — Ambulatory Visit (HOSPITAL_COMMUNITY): Payer: Medicare Other | Admitting: Hematology

## 2021-08-12 ENCOUNTER — Encounter (HOSPITAL_COMMUNITY): Payer: Self-pay | Admitting: Family Medicine

## 2021-08-12 ENCOUNTER — Ambulatory Visit (HOSPITAL_COMMUNITY): Payer: Medicare Other

## 2021-08-12 ENCOUNTER — Other Ambulatory Visit (HOSPITAL_COMMUNITY): Payer: Medicare Other

## 2021-08-12 DIAGNOSIS — I25119 Atherosclerotic heart disease of native coronary artery with unspecified angina pectoris: Secondary | ICD-10-CM | POA: Diagnosis not present

## 2021-08-12 DIAGNOSIS — Z7189 Other specified counseling: Secondary | ICD-10-CM | POA: Diagnosis not present

## 2021-08-12 DIAGNOSIS — C220 Liver cell carcinoma: Secondary | ICD-10-CM | POA: Diagnosis not present

## 2021-08-12 DIAGNOSIS — F419 Anxiety disorder, unspecified: Secondary | ICD-10-CM | POA: Diagnosis not present

## 2021-08-12 DIAGNOSIS — C800 Disseminated malignant neoplasm, unspecified: Secondary | ICD-10-CM | POA: Diagnosis not present

## 2021-08-12 DIAGNOSIS — K56609 Unspecified intestinal obstruction, unspecified as to partial versus complete obstruction: Secondary | ICD-10-CM | POA: Diagnosis not present

## 2021-08-12 DIAGNOSIS — Z515 Encounter for palliative care: Secondary | ICD-10-CM | POA: Diagnosis not present

## 2021-08-12 LAB — URINE CULTURE: Culture: NO GROWTH

## 2021-08-12 MED ORDER — MILK AND MOLASSES ENEMA
1.0000 | Freq: Once | RECTAL | Status: AC
Start: 2021-08-12 — End: 2021-08-12
  Administered 2021-08-12: 240 mL via RECTAL

## 2021-08-12 NOTE — Progress Notes (Signed)
Progress Note   Patient: Logan Alvarez:[id number] DOB: September 13, 1955 DOA: 08/07/2021     4 DOS: the patient was seen and examined on 08/12/2021   Brief hospital admission course: As per H&P written by Dr. Clearence Ped on 08/08/21 Logan Alvarez is a 66 y.o. male with medical history significant of history of SBO, carcinomatosis 2/2 hepatocellular carcinoma, coronary artery disease, hyperlipidemia, hypertension, and more presents the ED with a chief complaint of abdominal pain.  Patient reports that the pain started in the right lower quadrant and radiated diffusely.  It started around 7 PM.  He describes it is severe and cramping.  It was intermittent.  He has associated nausea and vomiting with 2 episodes of emesis that were nonbloody.  Patient felt flushed every time he had the cramping.  He had no diarrhea.  His last normal bowel movement was that morning.  He reports that he takes laxatives daily to stay regular.  He did feel constipated throughout the day afterwards, trying to evacuate his bowels without success.  His last normal meal was dinner on the day of presentation.  Patient reports that he had small breakfast with a bowl of cereal, small lunch with a leftover sweet potato, but then he started feeling queasy and thought it was because he had not eaten enough.  Senyene swings are chicken for dinner.  He had flatulence and belching.  He reports that he felt his stomach cramping and could hear grumbling.  The pain was too much for him to wait through the night so he came to the ED.  Patient reports that the last time this happened was in May, but he been doing well since then.  Patient does have a history of hepatocellular carcinoma with carcinomatosis and gets immunotherapy with Dr. Delton Coombes.  Patient is a previous drinker, but reports that he has stopped since he started treatment because his liver enzymes kept going up.   Patient does not drink, does not smoke, does not use illicit drugs.  He is  vaccinated for COVID.  Patient is DNR.  Assessment and Plan: * SBO (small bowel obstruction) (HCC) - Abdominal pain, nausea and vomiting reported at time of admission. -CT abdomen pelvis shows distal small bowel obstruction with transition zone in the right lower quadrant -In the setting of carcinomatosis. -Continue conservative management; continue to follow recommendations by general surgery; patient most likely will benefit of small bowel through images if he fails to progress to. -Patient has become symptomatic after last diet advancement attempted; expressing some nausea and worsening abdominal pain.  No flatus today.  Case discussed with surgery with plans to change back to clear liquid diet and continue supportive care. -After discussing with oncology and palliative service patient willing to consider hospice care if he fails to further improve. -Continue to maintain adequate hydration, replete electrolytes as needed and continue to provide analgesics/antiemetics.  Transaminitis - Continue to avoid/minimize hepatotoxic agents -Follow LFTs.  Carcinomatosis ([place]) -Last immunotherapy treatment 2 weeks ago, next immunotherapy treatment in 1 week. -Continue to follow up with oncology as an outpatient.  CAD (coronary artery disease) - No chest pain -Continue the use of aspirin, ARB, beta-blocker and Plavix.  Anxiety - Continue home anxiolytic regimen.  Mixed hyperlipidemia -No statins will be used given history of cirrhosis and transaminitis.   HTN (hypertension) -Stable overall -Continue current antihypertensive agents.  Dysuria: -Currently denying dysuria -No fever, normal WBCs -UA not demonstrating acute infection. -Continue to maintain adequate hydration.  Subjective:  No  chest pain, no vomiting episodes or shortness of breath.  Patient is afebrile.  Reporting intermittent abdominal pain and nausea.  Last bowel movement on 08/11/2021 morning and currently no  flatus.  Physical Exam: Vitals:   08/11/21 2119 08/12/21 0458 08/12/21 0836 08/12/21 1320  BP: (!) 109/58 130/78 (!) 154/85 (!) 151/81  Pulse: 96 92 98 81  Resp: '19 14  20  '$ Temp: 99.7 F (37.6 C) 99.4 F (37.4 C)  98.6 F (37 C)  TempSrc: Oral   Oral  SpO2: 99% 98% 99% 99%  Weight:      Height:       General exam: Alert, awake, oriented x 3; complaining of intermittent abdominal pain (left lower quadrant with associated nausea).  No vomiting.  Last bowel movement reported on 08/11/21 morning.  Currently denying flatus. Respiratory system: Clear to auscultation. Respiratory effort normal.  No using accessory muscle.  Good saturation on room air. Cardiovascular system:RRR. No murmurs, rubs, gallops.  No JVD. Gastrointestinal system: Abdomen is slightly distended, decreased but present bowel sounds.  Positive tenderness to palpation mid and lower quadrants.   Central nervous system: Alert and oriented. No focal neurological deficits. Extremities: No cyanosis or clubbing. Skin: No petechiae. Psychiatry: Judgement and insight appear normal. Mood & affect appropriate.   Data Reviewed: No new laboratory data today  Family Communication: No family at bedside.  Disposition: Status is: Inpatient Remains inpatient appropriate because: Still has not achieved resolution of SBO.   Planned Discharge Destination: Home   Author: Barton Dubois, MD 08/12/2021 6:32 PM  For on call review [email or web address].

## 2021-08-12 NOTE — TOC Initial Note (Signed)
Transition of Care (TOC) - Initial/Assessment Note    Patient Details  Name: Logan Alvarez MRN: [id number] Date of Birth: 01/12/1956  Transition of Care (TOC) CM/SW Contact:    Kathleen Vescio, LCSW Phone Number: 08/12/2021, 1:23 PM  Clinical Narrative:                  Pt admitted from home. Pt had high readmission risk score earlier today. Met with pt and his daughter at bedside to assess. Pt lives in his own home. He has a son who also lives there. Pt is independent in ADLs at home. He does not use any DME. Pt drives and is able to get to appointments and obtain prescriptions without difficulty.  Pt does not anticipate needing any HH or DME at dc. TOC will follow and assist if needs arise.  Expected Discharge Plan: Home/Self Care Barriers to Discharge: Continued Medical Work up   Patient Goals and CMS Choice Patient states their goals for this hospitalization and ongoing recovery are:: get better      Expected Discharge Plan and Services Expected Discharge Plan: Home/Self Care In-house Referral: Clinical Social Work     Living arrangements for the past 2 months: Single Family Home                                      Prior Living Arrangements/Services Living arrangements for the past 2 months: Single Family Home Lives with:: Adult Children Patient language and need for interpreter reviewed:: Yes Do you feel safe going back to the place where you live?: Yes      Need for Family Participation in Patient Care: No (Comment)     Criminal Activity/Legal Involvement Pertinent to Current Situation/Hospitalization: No - Comment as needed  Activities of Daily Living Home Assistive Devices/Equipment: Blood pressure cuff, Eyeglasses, Scales ADL Screening (condition at time of admission) Patient's cognitive ability adequate to safely complete daily activities?: Yes Is the patient deaf or have difficulty hearing?: No Does the patient have difficulty seeing, even when  wearing glasses/contacts?: No Does the patient have difficulty concentrating, remembering, or making decisions?: No Patient able to express need for assistance with ADLs?: Yes Does the patient have difficulty dressing or bathing?: No Independently performs ADLs?: Yes (appropriate for developmental age) Communication: Independent Dressing (OT): Independent Grooming: Independent Feeding: Independent Bathing: Independent Toileting: Independent In/Out Bed: Independent Walks in Home: Independent Does the patient have difficulty walking or climbing stairs?: No Weakness of Legs: None Weakness of Arms/Hands: None  Permission Sought/Granted                  Emotional Assessment Appearance:: Appears stated age Attitude/Demeanor/Rapport: Engaged Affect (typically observed): Pleasant Orientation: : Oriented to Self, Oriented to Place, Oriented to  Time, Oriented to Situation Alcohol / Substance Use: Not Applicable Psych Involvement: No (comment)  Admission diagnosis:  Small bowel obstruction (HCC) [K56.609] SBO (small bowel obstruction) (HCC) [K56.609] Patient Active Problem List   Diagnosis Date Noted   Carcinomatosis (HCC) 08/08/2021   Transaminitis 08/08/2021   Alcohol dependence (HCC) 06/12/2021   SBO (small bowel obstruction) (HCC) 06/11/2021   Peritoneal carcinomatosis (HCC) 03/04/2021   Portal hypertension (HCC) 03/04/2021   NSTEMI (non-ST elevated myocardial infarction) (HCC) 12/02/2020   CAD (coronary artery disease)    Elevated CEA    Elevated CA 19-9 level    Erectile dysfunction 11/11/2018   Hepatocellular carcinoma (HCC) 11/12/2017     Encounter for chronic pain management    Arthritis of midtarsal joint of right foot 03/03/2017   Testosterone deficiency 05/27/2012   Atypical chest pain 04/15/2011   HTN (hypertension) 04/15/2011   Mixed hyperlipidemia 04/15/2011   Anxiety 04/15/2011   PCP:  Kathyrn Drown, MD Pharmacy:   [facility] [address] [place] [address] Phone: [phone] Fax: [phone]     Social Determinants of Health (SDOH) Interventions    Readmission Risk Interventions     No data to display

## 2021-08-12 NOTE — Progress Notes (Signed)
Morning medication administered without complication. Pt complains of a 7/10 abdominal pain and discomfort. Prn morphine was administered. Milk and molasses enema has been completed  pt tolerated well and had several BM.

## 2021-08-12 NOTE — Consult Note (Addendum)
[facility] Consultation Oncology  Name: Logan Alvarez      MRN: [id number]    Location: I433/I951-88  Date: 08/12/2021 Time:5:15 PM   REFERRING PHYSICIAN: Dr. Dyann Kief  REASON FOR CONSULT: Hepatocellular carcinoma with peritoneal carcinomatosis   DIAGNOSIS: Partial small bowel obstruction  HISTORY OF PRESENT ILLNESS: Mr. Italiano is a very pleasant 66 year old white male who is seen in consultation today at the request of Dr. Dyann Kief.  He is known to me from office visits and is currently receiving Atezolizumab, last given on 07/29/2021.  He presented to the ER on 08/08/2021 with right lower quadrant pain.  CT abdomen and pelvis on admission showed distal small bowel obstruction with transition zone in the right lower quadrant.  Peritoneal carcinomatosis was stable.  He was placed on clear liquids with conservative management.  He had couple of bowel movements today, watery.  He reports pain in the left lower quadrant which is intermittent.  Pain in the right lower quadrant has improved.  He is requiring morphine for this pain.  PAST MEDICAL HISTORY:   Past Medical History:  Diagnosis Date   Bowel obstruction ([place]) 06/2021   CAD (coronary artery disease)    a. 01/2013: cath showing nonobstructive disease. b. 11/2020: NSTEMI with DES to mid-LCx and medical management recommended of distal LCx disease given small vessel size   Hepatocellular carcinoma ([place])    Stage IV   Hyperlipidemia    Hypertension    Liver tumor    Testosterone deficiency 2009    ALLERGIES: Allergies  Allergen Reactions   Cymbalta [Duloxetine Hcl]     Bad dreams   Lodine [Etodolac] Nausea And Vomiting   Zoloft [Sertraline Hcl]     REDUCED URINARY FLOW,NOCTURIA      MEDICATIONS: I have reviewed the patient's current medications.     PAST SURGICAL HISTORY Past Surgical History:  Procedure Laterality Date   BALLOON DILATION  07/30/2020   Procedure: BALLOON DILATION;  Surgeon: Eloise Harman, DO;   [facility];  Service: Endoscopy;;   BALLOON DILATION N/A 07/10/2021   Procedure: Larrie Kass DILATION;  Surgeon: Eloise Harman, DO;  [facility];  Service: Endoscopy;  Laterality: N/A;   COLONOSCOPY N/A 03/12/2017   Procedure: COLONOSCOPY;  Surgeon: Danie Binder, MD;  [facility];  Service: Endoscopy;  Laterality: N/A;  2:00   COLONOSCOPY WITH PROPOFOL N/A 06/18/2020   Procedure: COLONOSCOPY WITH PROPOFOL;  Surgeon: Eloise Harman, DO;  [facility];  Service: Endoscopy;  Laterality: N/A;  pm appt   CORONARY STENT INTERVENTION N/A 12/03/2020   Procedure: CORONARY STENT INTERVENTION;  Surgeon: Jettie Booze, MD;  Location: [facility];  Service: Cardiovascular;  Laterality: N/A;   ESOPHAGOGASTRODUODENOSCOPY (EGD) WITH PROPOFOL N/A 07/30/2020   Procedure: ESOPHAGOGASTRODUODENOSCOPY (EGD) WITH PROPOFOL;  Surgeon: Eloise Harman, DO;  [facility];  Service: Endoscopy;  Laterality: N/A;  11:45am   ESOPHAGOGASTRODUODENOSCOPY (EGD) WITH PROPOFOL N/A 07/10/2021   Procedure: ESOPHAGOGASTRODUODENOSCOPY (EGD) WITH PROPOFOL;  Surgeon: Eloise Harman, DO;  [facility];  Service: Endoscopy;  Laterality: N/A;  1:30pm   LEFT HEART CATH AND CORONARY ANGIOGRAPHY N/A 12/03/2020   Procedure: LEFT HEART CATH AND CORONARY ANGIOGRAPHY;  Surgeon: Jettie Booze, MD;  Location: [facility];  Service: Cardiovascular;  Laterality: N/A;   LEFT HEART CATHETERIZATION WITH CORONARY ANGIOGRAM N/A 02/01/2013   Procedure: LEFT HEART CATHETERIZATION WITH CORONARY ANGIOGRAM;  Surgeon: Josue Hector, MD;  Location: [facility];  Service: Cardiovascular;  Laterality: N/A;   liver tumor removed  2020   NECK SURGERY     POLYPECTOMY  06/18/2020   Procedure: POLYPECTOMY;  Surgeon: Eloise Harman, DO;  [facility];  Service: Endoscopy;;  snare and biospy    FAMILY HISTORY: Family History  Problem Relation Age of  Onset   Heart attack Father        81's    SOCIAL HISTORY:  reports that he has never smoked. He has never used smokeless tobacco. He reports that he does not currently use alcohol. He reports that he does not use drugs.  PERFORMANCE STATUS: The patient's performance status is 2 - Symptomatic, <50% confined to bed  PHYSICAL EXAM: Most Recent Vital Signs: Blood pressure (!) 151/81, pulse 81, temperature 98.6 F (37 C), temperature source Oral, resp. rate 20, height '5\' 8"'$  (1.727 m), weight 184 lb 8.4 oz (83.7 kg), SpO2 99 %. BP (!) 151/81   Pulse 81   Temp 98.6 F (37 C) (Oral)   Resp 20   Ht '5\' 8"'$  (1.727 m)   Wt 184 lb 8.4 oz (83.7 kg)   SpO2 99%   BMI 28.06 kg/m  General appearance: alert, cooperative, and appears stated age Head: Normocephalic, without obvious abnormality, atraumatic Abdomen:  Soft, mild distention with bowel sounds present Extremities:  No edema or cyanosis Neurologic: Grossly normal  LABORATORY DATA:  Results for orders placed or performed during the hospital encounter of 08/07/21 (from the past 48 hour(s))  Urinalysis, Routine w reflex microscopic Urine, Clean Catch     Status: Abnormal   Collection Time: 08/10/21  8:31 PM  Result Value Ref Range   Color, Urine AMBER (A) YELLOW    Comment: BIOCHEMICALS MAY BE AFFECTED BY COLOR   APPearance CLEAR CLEAR   Specific Gravity, Urine 1.018 1.005 - 1.030   pH 5.0 5.0 - 8.0   Glucose, UA NEGATIVE NEGATIVE mg/dL   Hgb urine dipstick NEGATIVE NEGATIVE   Bilirubin Urine NEGATIVE NEGATIVE   Ketones, ur 80 (A) NEGATIVE mg/dL   Protein, ur NEGATIVE NEGATIVE mg/dL   Nitrite NEGATIVE NEGATIVE   Leukocytes,Ua NEGATIVE NEGATIVE    Comment: Performed at [facility], [address], [place], [place] [address]  Urine Culture     Status: None   Collection Time: 08/10/21  8:31 PM   Specimen: Urine, Clean Catch  Result Value Ref Range   Specimen Description      URINE, CLEAN CATCH Performed at [facility],  [address], [place], [place] [address]    Special Requests      NONE Performed at [facility], [name] [place], [place] [address]    Culture      NO GROWTH Performed at [facility], [place] [address] [place], [place] [address]    Report Status 08/12/2021 FINAL   Hepatic function panel     Status: Abnormal   Collection Time: 08/11/21  1:45 PM  Result Value Ref Range   Total Protein 6.1 (L) 6.5 - 8.1 g/dL   Albumin 2.9 (L) 3.5 - 5.0 g/dL   AST 39 15 - 41 U/L   ALT 80 (H) 0 - 44 U/L   Alkaline Phosphatase 78 38 - 126 U/L   Total Bilirubin 2.5 (H) 0.3 - 1.2 mg/dL   Bilirubin, Direct 0.6 (H) 0.0 - 0.2 mg/dL   Indirect Bilirubin 1.9 (H) 0.3 - 0.9 mg/dL    Comment: Performed at  [address] [place], [place] [address]      RADIOGRAPHY: No results found.       ASSESSMENT and PLAN:  1.  Railroad with peritoneal carcinomatosis: - Currently on Atezolizumab with or without bevacizumab.  Last dose on 07/29/2021. - CTAP from 08/08/2021 showed distal small bowel obstruction with a transition zone seen within the right lower quadrant.  Stable findings consistent with peritoneal carcinomatosis.  Mild amount of abdominal pelvic free fluid.  Hepatic cirrhosis.  2.  Small bowel obstruction: - He is on clear liquid diet.  He has left lower quadrant pain intermittently.  He is on MiraLAX and Dulcolax suppository and milk of molasses enema. - If there is any worsening of his obstruction without a clear chance of improvement, he is willing to consider hospice. - Discussed with the patient and his daughter Vito Backers.  All questions were answered. The patient knows to call the clinic with any problems, questions or concerns. We can certainly see the patient much sooner if necessary.   Derek Jack

## 2021-08-12 NOTE — Progress Notes (Signed)
[facility] Progress Note     Subjective: Patient seen and examined.  He is resting comfortably in bed. He is complaining of intermittent abdominal pain with associated nausea.  He denies vomiting.  His last bowel movement was yesterday morning.  He denies flatus.  He has only been drinking liquids.  Objective: Vital signs in last 24 hours: Temp:  [99.3 F (37.4 C)-99.7 F (37.6 C)] 99.4 F (37.4 C) (07/11 0458) Pulse Rate:  [78-98] 98 (07/11 0836) Resp:  [14-19] 14 (07/11 0458) BP: (109-154)/(58-85) 154/85 (07/11 0836) SpO2:  [97 %-99 %] 99 % (07/11 0836) Last BM Date : 08/10/21  Intake/Output from previous day: 07/10 0701 - 07/11 0700 In: 720 [P.O.:720] Out: -  Intake/Output this shift: No intake/output data recorded.  General appearance: alert, cooperative, and no distress GI: Abdomen soft, mild distention, mild TTP in mid and lower abdomen; no rigidity, guarding, or rebound tenderness  Lab Results:  Recent Labs    08/10/21 0615  WBC 8.5  HGB 13.6  HCT 40.1  PLT 169   BMET Recent Labs    08/10/21 0615  NA 130*  K 3.8  CL 100  CO2 23  GLUCOSE 72  BUN 8  CREATININE 0.52*  CALCIUM 8.3*   PT/INR No results for input(s): "LABPROT", "INR" in the last 72 hours.  Studies/Results: No results found.  Anti-infectives: Anti-infectives (From admission, onward)    None       Assessment/Plan:  Patient is a 66 year old male who was admitted with partial small bowel obstruction secondary to carcinomatosis.  -Patient feeling more abdominal pain and some nausea this AM -Decreased diet to CLD from soft diet -Milk and molasses enema ordered -Continue Miralax and dulcolax suppository -Would hold off on small bowel obstruction protocol at this time since it will not change management of the patient. Would consider if patient fails to progress to verify full obstruction -No plan for surgical intervention in this patient, especially given  carcinomatosis as the source of obstruction -No plan for decompressive PEG at this time, as patient not currently vomiting -I discussed with the patient that if he fails to progress from this episode of obstruction, he would be hospice appropriate. Patient was agreeable and understanding. -Palliative care consulted -Oncology consulted, appreciate recommendations -Care per primary team   LOS: 4 days    [place] 08/12/2021

## 2021-08-12 NOTE — Consult Note (Signed)
Consultation Note Date: 08/12/2021   Patient Name: Logan Alvarez  DOB: January 13, 1956  MRN: [id number]  Age / Sex: 66 y.o., male  PCP: Kathyrn Drown, MD Referring Physician: Barton Dubois, MD  Reason for Consultation: Establishing goals of care  HPI/Patient Profile: 66 y.o. male  with past medical history of carcinomatosis secondary to hepatocellular carcinoma, history of SBO, CAD, HTN/HLD, admitted on 08/07/2021 with small bowel obstruction in the setting of carcinomatosis.   Clinical Assessment and Goals of Care: I have reviewed medical records including EPIC notes, labs and imaging, received report from RN, assessed the patient.  Mr. Laker is lying quietly in bed.  He greets me, making and mostly keeping eye contact.  He appears chronically ill.  He is alert and oriented x3, able to make his basic needs known.  His daughter/healthcare agent, Juergen Hardenbrook, is present at bedside.    We meet at bedside  to discuss diagnosis prognosis, GOC, EOL wishes, disposition and options.  I introduced Palliative Medicine as specialized medical care for people living with serious illness. It focuses on providing relief from the symptoms and stress of a serious illness. The goal is to improve quality of life for both the patient and the family.  We focused on their current illness.  We talk about surgery consultation and recommendations.  We talk about recurrent small bowel obstruction and the treatment plan.  Mr. Swaim shares that he takes his treatments here in [place] with Dr. Delton Coombes.  He is shares that he was scheduled for another treatment around the 18th of this month.  We talk about expected visit this afternoon from Dr. Raliegh Ip while he is inpatient.  I share a diagram of the chronic illness pathway, what is normal and expected, and how this differs in those with cancer.   The natural disease trajectory and expectations  at EOL were discussed.  Advanced directives, concepts specific to code status, artifical feeding and hydration, and rehospitalization were considered and discussed.  We talked about the concept of "treat the treatable, but allowing natural passing"/DNR.   Both patient and HCPOA endorse.   Hospice and Palliative Care services outpatient were explained and offered.  We talk in detail about in home "treat the treatable" hospice care in the home and also "comfort care" at residential hospice.  We talk about time for choice.    Discussed the importance of continued conversation with family and the medical providers regarding overall plan of care and treatment options, ensuring decisions are within the context of the patient's values and GOCs.  Questions and concerns were addressed. The family was encouraged to call with questions or concerns.  PMT will continue to support holistically.  Conference with attending, bedside nursing staff, transition of care team related to patient condition, needs, goals of care, disposition.   HCPOA  NEXT OF KIN - daughter, Faruq Rosenberger.  Mr. Stockley has two sons, but they     SUMMARY OF RECOMMENDATIONS   Continue to treat Time for outcomes. PMT to follow  Code Status/Advance Care Planning: DNR -verified with patient and healthcare agent  Symptom Management:  Per hospitalist, no additional needs at this time.  Palliative Prophylaxis:  Frequent Pain Assessment and Oral Care  Additional Recommendations (Limitations, Scope, Preferences): Treat the treatable, but no CPR or intubation   Psycho-social/Spiritual:  Desire for further Chaplaincy support:yes Additional Recommendations: Caregiving  Support/Resources and Education on Hospice  Prognosis:  Unable to determine, 3 months or less would not be surprising based on cancer burden.   Discharge Planning:  to be determined, based on outcomes.        Primary Diagnoses: Present on Admission:  SBO  (small bowel obstruction) (HCC)  Anxiety  HTN (hypertension)  Mixed hyperlipidemia   I have reviewed the medical record, interviewed the patient and family, and examined the patient. The following aspects are pertinent.  Past Medical History:  Diagnosis Date   Bowel obstruction ([facility]) 06/2021   CAD (coronary artery disease)    a. 01/2013: cath showing nonobstructive disease. b. 11/2020: NSTEMI with DES to mid-LCx and medical management recommended of distal LCx disease given small vessel size   Hepatocellular carcinoma ([facility])    Stage IV   Hyperlipidemia    Hypertension    Liver tumor    Testosterone deficiency 2009   Social History   Socioeconomic History   Marital status: Widowed    Spouse name: Not on file   Number of children: Not on file   Years of education: Not on file   Highest education level: Not on file  Occupational History   Not on file  Tobacco Use   Smoking status: Never   Smokeless tobacco: Never  Vaping Use   Vaping Use: Never used  Substance and Sexual Activity   Alcohol use: Not Currently    Comment: 4-6 beers a day   Drug use: No   Sexual activity: Not on file  Other Topics Concern   Not on file  Social History Narrative   Not on file   Social Determinants of Health   Financial Resource Strain: Not on file  Food Insecurity: Not on file  Transportation Needs: Not on file  Physical Activity: Not on file  Stress: Not on file  Social Connections: Not on file   Family History  Problem Relation Age of Onset   Heart attack Father        37's   Scheduled Meds:  aspirin EC  81 mg Oral Daily   bisacodyl  10 mg Rectal Q1200   heparin  5,000 Units Subcutaneous Q8H   metoprolol tartrate  12.5 mg Oral BID   polyethylene glycol  17 g Oral BID   Continuous Infusions:  sodium chloride 75 mL/hr at 08/12/21 0037   PRN Meds:.acetaminophen **OR** acetaminophen, ALPRAZolam, morphine injection, ondansetron **OR** ondansetron (ZOFRAN) IV, oxyCODONE,  prochlorperazine Medications Prior to Admission:  Prior to Admission medications   Medication Sig Start Date End Date Taking? Authorizing Provider  acidophilus (RISAQUAD) CAPS capsule Take 1 capsule by mouth daily.   Yes [provider]  ALPRAZolam Duanne Moron) 0.5 MG tablet Take one tablet po QID Patient taking differently: Take 0.5 mg by mouth in the morning, at noon, in the evening, and at bedtime. 04/29/21  Yes Kathyrn Drown, MD  clopidogrel (PLAVIX) 75 MG tablet Take 1 tablet (75 mg total) by mouth daily. 06/02/21  Yes Strader, [place] M, PA-C  docusate sodium (COLACE) 100 MG capsule Take 1 capsule (100 mg total) by mouth 2 (two) times daily. 06/15/21  Yes Johnson, Clanford L, MD  losartan (COZAAR) 25 MG tablet TAKE 1 TABLET(25 MG) BY MOUTH IN THE MORNING Patient taking differently: Take 25 mg by mouth daily. 07/04/21  Yes Kathyrn Drown, MD  metoprolol tartrate (LOPRESSOR) 25 MG tablet Take 0.5 tablets (12.5 mg total) by mouth 2 (two) times daily. 07/04/21 06/29/22 Yes Strader, Fransisco Hertz, PA-C  oxyCODONE (OXY IR/ROXICODONE) 5 MG immediate release tablet Take 1 tablet (5 mg total) by mouth every 4 (four) hours as needed (pain). 07/08/21  Yes Luking, Elayne Snare, MD  polyethylene glycol (MIRALAX / GLYCOLAX) 17 g packet Take 17 g by mouth daily as needed for mild constipation.   Yes [provider]  predniSONE (DELTASONE) 20 MG tablet TAKE 1/2 TABLET(10 MG) BY MOUTH DAILY WITH BREAKFAST Patient taking differently: Take 10 mg by mouth daily with breakfast. 08/06/21  Yes Derek Jack, MD  nitroGLYCERIN (NITROSTAT) 0.4 MG SL tablet Place 1 tablet (0.4 mg total) under the tongue every 5 (five) minutes as needed for chest pain. 12/13/20   Strader, Fransisco Hertz, PA-C  prochlorperazine (COMPAZINE) 10 MG tablet Take 1 tablet (10 mg total) by mouth every 6 (six) hours as needed for nausea or vomiting. 05/08/21   Derek Jack, MD  pantoprazole (PROTONIX) 40 MG tablet Take 1 tablet (40 mg  total) by mouth daily. 07/10/21 07/10/22  Eloise Harman, DO   Allergies  Allergen Reactions   Cymbalta [Duloxetine Hcl]     Bad dreams   Lodine [Etodolac] Nausea And Vomiting   Zoloft [Sertraline Hcl]     REDUCED URINARY FLOW,NOCTURIA   Review of Systems  Unable to perform ROS: Other    Physical Exam Vitals and nursing note reviewed.  Constitutional:      General: He is not in acute distress.    Appearance: He is ill-appearing.  Cardiovascular:     Rate and Rhythm: Normal rate.  Skin:    General: Skin is warm and dry.  Neurological:     Mental Status: He is alert and oriented to person, place, and time.  Psychiatric:        Mood and Affect: Mood normal.        Behavior: Behavior normal.     Vital Signs: BP (!) 151/81   Pulse 81   Temp 98.6 F (37 C) (Oral)   Resp 20   Ht '5\' 8"'$  (1.727 m)   Wt 83.7 kg   SpO2 99%   BMI 28.06 kg/m  Pain Scale: 0-10 POSS *See Group Information*: 1-Acceptable,Awake and alert Pain Score: 4    SpO2: SpO2: 99 % O2 Device:SpO2: 99 % O2 Flow Rate: .O2 Flow Rate (L/min): 0 L/min  IO: Intake/output summary:  Intake/Output Summary (Last 24 hours) at 08/12/2021 1433 Last data filed at 08/12/2021 1300 Gross per 24 hour  Intake 798 ml  Output --  Net 798 ml    LBM: Last BM Date : 08/10/21 Baseline Weight: Weight: 83.7 kg Most recent weight: Weight: 83.7 kg     Palliative Assessment/Data:   Flowsheet Rows    Flowsheet Row Most Recent Value  Intake Tab   Referral Department Hospitalist  Unit at Time of Referral Cardiac/Telemetry Unit  Palliative Care Primary Diagnosis Cancer  Date Notified 08/12/21  Palliative Care Type New Palliative care  Reason for referral Clarify Goals of Care  Date of Admission 08/07/21  Date first seen by Palliative Care 08/12/21  # of days Palliative referral response time 0 Day(s)  # of days IP prior  to Palliative referral 5  Clinical Assessment   Palliative Performance Scale Score 40%  Pain Max  last 24 hours Not able to report  Pain Min Last 24 hours Not able to report  Dyspnea Max Last 24 Hours Not able to report  Dyspnea Min Last 24 hours Not able to report  Psychosocial & Spiritual Assessment   Palliative Care Outcomes        Time In: 1130 Time Out: 1245 Time Total: 75 minutes  Greater than 50%  of this time was spent counseling and coordinating care related to the above assessment and plan.  Signed by: Drue Novel, NP   Please contact Palliative Medicine Team phone at [phone] for questions and concerns.  For individual provider: See Shea Evans

## 2021-08-13 DIAGNOSIS — K56609 Unspecified intestinal obstruction, unspecified as to partial versus complete obstruction: Secondary | ICD-10-CM | POA: Diagnosis not present

## 2021-08-13 LAB — BASIC METABOLIC PANEL
Anion gap: 8 (ref 5–15)
BUN: <5 mg/dL — ABNORMAL LOW (ref 8–23)
CO2: 26 mmol/L (ref 22–32)
Calcium: 8.3 mg/dL — ABNORMAL LOW (ref 8.9–10.3)
Chloride: 95 mmol/L — ABNORMAL LOW (ref 98–111)
Creatinine, Ser: 0.52 mg/dL — ABNORMAL LOW (ref 0.61–1.24)
GFR, Estimated: >60 mL/min (ref >60)
Glucose, Bld: 98 mg/dL (ref 70–99)
Potassium: 3.3 mmol/L — ABNORMAL LOW (ref 3.5–5.1)
Sodium: 129 mmol/L — ABNORMAL LOW (ref 135–145)

## 2021-08-13 LAB — CBC
HCT: 37.9 % — ABNORMAL LOW (ref 39.0–52.0)
Hemoglobin: 13 g/dL (ref 13.0–17.0)
MCH: 31.8 pg (ref 26.0–34.0)
MCHC: 34.3 g/dL (ref 30.0–36.0)
MCV: 92.7 fL (ref 80.0–100.0)
Platelets: 191 10*3/uL (ref 150–400)
RBC: 4.09 MIL/uL — ABNORMAL LOW (ref 4.22–5.81)
RDW: 12.6 % (ref 11.5–15.5)
WBC: 6.3 10*3/uL (ref 4.0–10.5)
nRBC: 0 % (ref 0.0–0.2)

## 2021-08-13 LAB — MAGNESIUM: Magnesium: 1.7 mg/dL (ref 1.7–2.4)

## 2021-08-13 MED ORDER — BISACODYL 10 MG RE SUPP: 10.0000 mg | Freq: Every day | RECTAL | 0 refills | Status: AC

## 2021-08-13 MED ORDER — ASPIRIN EC 81 MG PO TBEC: 81.0000 mg | DELAYED_RELEASE_TABLET | Freq: Every day | ORAL | 11 refills | Status: AC

## 2021-08-13 MED ORDER — PROMETHAZINE HCL 12.5 MG PO TABS: 12.5000 mg | ORAL_TABLET | Freq: Four times a day (QID) | ORAL | 0 refills | Status: AC | PRN

## 2021-08-13 MED ORDER — POLYETHYLENE GLYCOL 3350 17 G PO PACK: 17.0000 g | PACK | Freq: Two times a day (BID) | ORAL | 0 refills | Status: AC

## 2021-08-13 MED ORDER — ONDANSETRON HCL 4 MG PO TABS: 4.0000 mg | ORAL_TABLET | Freq: Four times a day (QID) | ORAL | 0 refills | Status: DC | PRN

## 2021-08-13 MED ORDER — POTASSIUM CHLORIDE 20 MEQ PO PACK
40.0000 meq | PACK | Freq: Once | ORAL | Status: AC
  Administered 2021-08-13: 40 meq via ORAL
  Filled 2021-08-13: qty 2

## 2021-08-13 MED ORDER — ENSURE COMPLETE PO LIQD: 237.0000 mL | Freq: Two times a day (BID) | ORAL | 0 refills | Status: AC

## 2021-08-13 NOTE — Progress Notes (Signed)
[facility] Progress Note     Subjective: Patient seen and examined.  He is resting comfortably in bed.  His abdominal pain is improved this morning.  He had a small bout of nausea this AM without vomiting.  He had a bowel movement prior to his enema, a bowel movement after his enema, and an additional bowel movement this AM.  He denies passing flatus with bowel movements.    Objective: Vital signs in last 24 hours: Temp:  [97.5 F (36.4 C)-98.9 F (37.2 C)] 97.5 F (36.4 C) (07/12 0516) Pulse Rate:  [81-92] 87 (07/12 0516) Resp:  [16-20] 16 (07/12 0516) BP: (148-153)/(81-90) 148/90 (07/12 0516) SpO2:  [98 %-99 %] 98 % (07/12 0516) Last BM Date : 08/13/21  Intake/Output from previous day: 07/11 0701 - 07/12 0700 In: 518 [P.O.:518] Out: -  Intake/Output this shift: No intake/output data recorded.  General appearance: alert, cooperative, and no distress GI: Abdomen soft, mild distention, no percussion tenderness, non tender to palpation; no rigidity, guarding, or rebound tenderness  Lab Results:  Recent Labs    08/13/21 0552  WBC 6.3  HGB 13.0  HCT 37.9*  PLT 191   BMET Recent Labs    08/13/21 0552  NA 129*  K 3.3*  CL 95*  CO2 26  GLUCOSE 98  BUN <5*  CREATININE 0.52*  CALCIUM 8.3*   PT/INR No results for input(s): "LABPROT", "INR" in the last 72 hours.  Studies/Results: No results found.  Anti-infectives: Anti-infectives (From admission, onward)    None       Assessment/Plan:  Patient is a 66 year old male who was admitted with partial small bowel obstruction secondary to carcinomatosis.  -Patient feeling better this AM in terms of abdominal exam -Having liquid bowel movements -Advanced diet to FLD -Continue Miralax and dulcolax suppository -Recommend against small bowel obstruction protocol, as it will likely show slow transit of contrast. Patient continues to move his bowels, so he most likely has a partial obstruction  secondary to his carcinomatosis -No plan for surgical intervention in this patient - partial obstruction secondary to carcinomatosis -No plan for decompressive PEG - patient without vomiting -Patient understands that he will need to be discharged with hospice if he fails to progress during this admission -Patient expressed this AM that he would like to go home today, as he feels that he will not be doing anything different at home in terms of medications and diet.  I explained to the patient that he will likely have recurrent obstructive symptoms, but I would be agreeable to discharge if he tolerates the fulls. Would discharge patient on full liquid diet and protein shakes. Will relay to primary team -Patient would need to re-present to the hospital if he has worsening abdominal pain, nausea, vomiting, and stops having bowel movements -Oncology agrees with hospice if patient fails to progress -Care per primary team   LOS: 5 days    [place] 08/13/2021

## 2021-08-13 NOTE — Progress Notes (Signed)
Nsg Discharge Note  Admit Date:  08/07/2021 Discharge date: 08/13/2021   Logan Alvarez to be D/C'd Home per MD order.  AVS completed.  Copy for chart, and copy for patient signed, and dated. Patient/caregiver able to verbalize understanding.  Discharge Medication: Allergies as of 08/13/2021       Reactions   Cymbalta [duloxetine Hcl]    Bad dreams   Lodine [etodolac] Nausea And Vomiting   Zoloft [sertraline Hcl]    REDUCED URINARY FLOW,NOCTURIA        Medication List     TAKE these medications    acidophilus Caps capsule Take 1 capsule by mouth daily.   ALPRAZolam 0.5 MG tablet Commonly known as: XANAX Take one tablet po QID What changed:  how much to take how to take this when to take this additional instructions   aspirin EC 81 MG tablet Take 1 tablet (81 mg total) by mouth daily. Swallow whole.   bisacodyl 10 MG suppository Commonly known as: DULCOLAX Place 1 suppository (10 mg total) rectally daily at 12 noon.   clopidogrel 75 MG tablet Commonly known as: PLAVIX Take 1 tablet (75 mg total) by mouth daily.   docusate sodium 100 MG capsule Commonly known as: Colace Take 1 capsule (100 mg total) by mouth 2 (two) times daily.   feeding supplement (ENSURE COMPLETE) Liqd Take 237 mLs by mouth 2 (two) times daily between meals.   losartan 25 MG tablet Commonly known as: COZAAR TAKE 1 TABLET(25 MG) BY MOUTH IN THE MORNING What changed: See the new instructions.   metoprolol tartrate 25 MG tablet Commonly known as: LOPRESSOR Take 0.5 tablets (12.5 mg total) by mouth 2 (two) times daily.   nitroGLYCERIN 0.4 MG SL tablet Commonly known as: NITROSTAT Place 1 tablet (0.4 mg total) under the tongue every 5 (five) minutes as needed for chest pain.   oxyCODONE 5 MG immediate release tablet Commonly known as: Oxy IR/ROXICODONE Take 1 tablet (5 mg total) by mouth every 4 (four) hours as needed (pain).   polyethylene glycol 17 g packet Commonly known as:  MIRALAX / GLYCOLAX Take 17 g by mouth 2 (two) times daily. What changed:  when to take this reasons to take this   predniSONE 20 MG tablet Commonly known as: DELTASONE TAKE 1/2 TABLET(10 MG) BY MOUTH DAILY WITH BREAKFAST What changed: See the new instructions.   prochlorperazine 10 MG tablet Commonly known as: COMPAZINE Take 1 tablet (10 mg total) by mouth every 6 (six) hours as needed for nausea or vomiting.   promethazine 12.5 MG tablet Commonly known as: PHENERGAN Take 1 tablet (12.5 mg total) by mouth every 6 (six) hours as needed for nausea or vomiting.        Discharge Assessment: Vitals:   08/12/21 2044 08/13/21 0516  BP: (!) 153/88 (!) 148/90  Pulse: 92 87  Resp: 16 16  Temp: 98.9 F (37.2 C) (!) 97.5 F (36.4 C)  SpO2: 98% 98%   Skin clean, dry and intact without evidence of skin break down, no evidence of skin tears noted. IV catheter discontinued intact. Site without signs and symptoms of complications - no redness or edema noted at insertion site, patient denies c/o pain - only slight tenderness at site.  Dressing with slight pressure applied.  D/c Instructions-Education: Discharge instructions given to patient/family with verbalized understanding. D/c education completed with patient/family including follow up instructions, medication list, d/c activities limitations if indicated, with other d/c instructions as indicated by MD - patient  able to verbalize understanding, all questions fully answered. Patient instructed to return to ED, call 911, or call MD for any changes in condition.  Patient escorted via [place], and D/C home via private auto.  Clovis Fredrickson, LPN 5/[id number]/4718 5:50 PM

## 2021-08-13 NOTE — Discharge Summary (Signed)
Physician Discharge Summary  Logan Alvarez PYP:[id number] DOB: [birth year] DOA: 08/07/2021  PCP: Kathyrn Drown, MD  Admit date: 08/07/2021  Discharge date: 08/13/2021  Admitted From:Home  Disposition:  Home  Recommendations for Outpatient Follow-up:  Follow up with PCP in 1-2 weeks Continue on suppository and MiraLAX daily as prescribed Zofran given as needed for any nausea or vomiting Follow-up with outpatient palliative care Continue full liquid diet with protein shake supplementation as prescribed Remains a high risk for readmission if he does not opt for palliative care/hospice in the outpatient setting as he would likely have recurrent issues with SBO in the setting of carcinomatosis  Home Health: None  Equipment/Devices: None  Discharge Condition:Stable  CODE STATUS: DNR  Diet recommendation: Full liquid diet  Brief/Interim Summary: Logan Alvarez is a 66 y.o. male with medical history significant of history of SBO, carcinomatosis 2/2 hepatocellular carcinoma, coronary artery disease, hyperlipidemia, hypertension, and more presents the ED with a chief complaint of abdominal pain.  Patient was diagnosed with small bowel obstruction that was noted on the CT scan and this was in the setting of carcinomatosis.  He was conservatively managed and remained on clear liquids which was advanced to full liquids prior to discharge.  He has no further nausea, vomiting, or significant abdominal pain and is having some regular bowel movements.  He is eager for discharge today and is stable from general surgery perspective.  He was seen by palliative care with recommendations to consider hospice care outpatient if he does not improve any further.  He continues to remain a high risk for readmission given his overall condition.  Discharge Diagnoses:  Principal Problem:   SBO (small bowel obstruction) (HCC) Active Problems:   HTN (hypertension)   Mixed hyperlipidemia   Anxiety   Cancer,  hepatocellular (HCC)   CAD (coronary artery disease)   Carcinomatosis (HCC)   Transaminitis  Principal discharge diagnosis: Small bowel obstruction in the setting of carcinomatosis.  Discharge Instructions  Discharge Instructions     Diet - low sodium heart healthy   Complete by: As directed    Increase activity slowly   Complete by: As directed       Allergies as of 08/13/2021       Reactions   Cymbalta [duloxetine Hcl]    Bad dreams   Lodine [etodolac] Nausea And Vomiting   Zoloft [sertraline Hcl]    REDUCED URINARY FLOW,NOCTURIA        Medication List     TAKE these medications    acidophilus Caps capsule Take 1 capsule by mouth daily.   ALPRAZolam 0.5 MG tablet Commonly known as: XANAX Take one tablet po QID What changed:  how much to take how to take this when to take this additional instructions   aspirin EC 81 MG tablet Take 1 tablet (81 mg total) by mouth daily. Swallow whole.   bisacodyl 10 MG suppository Commonly known as: DULCOLAX Place 1 suppository (10 mg total) rectally daily at 12 noon.   clopidogrel 75 MG tablet Commonly known as: PLAVIX Take 1 tablet (75 mg total) by mouth daily.   docusate sodium 100 MG capsule Commonly known as: Colace Take 1 capsule (100 mg total) by mouth 2 (two) times daily.   feeding supplement (ENSURE COMPLETE) Liqd Take 237 mLs by mouth 2 (two) times daily between meals.   losartan 25 MG tablet Commonly known as: COZAAR TAKE 1 TABLET(25 MG) BY MOUTH IN THE MORNING What changed: See the new instructions.  metoprolol tartrate 25 MG tablet Commonly known as: LOPRESSOR Take 0.5 tablets (12.5 mg total) by mouth 2 (two) times daily.   nitroGLYCERIN 0.4 MG SL tablet Commonly known as: NITROSTAT Place 1 tablet (0.4 mg total) under the tongue every 5 (five) minutes as needed for chest pain.   ondansetron 4 MG tablet Commonly known as: ZOFRAN Take 1 tablet (4 mg total) by mouth every 6 (six) hours as  needed for nausea.   oxyCODONE 5 MG immediate release tablet Commonly known as: Oxy IR/ROXICODONE Take 1 tablet (5 mg total) by mouth every 4 (four) hours as needed (pain).   polyethylene glycol 17 g packet Commonly known as: MIRALAX / GLYCOLAX Take 17 g by mouth 2 (two) times daily. What changed:  when to take this reasons to take this   predniSONE 20 MG tablet Commonly known as: DELTASONE TAKE 1/2 TABLET(10 MG) BY MOUTH DAILY WITH BREAKFAST What changed: See the new instructions.   prochlorperazine 10 MG tablet Commonly known as: COMPAZINE Take 1 tablet (10 mg total) by mouth every 6 (six) hours as needed for nausea or vomiting.        Follow-up Information     Luking, Elayne Snare, MD. Schedule an appointment as soon as possible for a visit in 1 week(s).   Specialty: Family Medicine Contact information: [place] [address] [phone]                Allergies  Allergen Reactions   Cymbalta [Duloxetine Hcl]     Bad dreams   Lodine [Etodolac] Nausea And Vomiting   Zoloft [Sertraline Hcl]     REDUCED URINARY FLOW,NOCTURIA    Consultations: General surgery Oncology Palliative Care   Procedures/Studies: Abd 1 View (KUB)  Result Date: 08/08/2021 CLINICAL DATA:  Abdominal pain. EXAM: ABDOMEN - 1 VIEW COMPARISON:  KUB, 06/13/2021.  CT AP 08/08/2021. FINDINGS: Multiple air-and-fluid distended loops of small bowel scattered within abdomen. No radiographic evidence of free intraperitoneal air. Mild distention of urinary bladder with previously administered contrast. No interval osseous abnormality. . IMPRESSION: Findings compatible with small-bowel obstruction. Electronically Signed   By: Michaelle Birks M.D.   On: 08/08/2021 07:03   CT ABDOMEN PELVIS W CONTRAST  Result Date: 08/08/2021 CLINICAL DATA:  Abdominal pain. EXAM: CT ABDOMEN AND PELVIS WITH CONTRAST TECHNIQUE: Multidetector CT imaging of the abdomen and pelvis was performed using the  standard protocol following bolus administration of intravenous contrast. RADIATION DOSE REDUCTION: This exam was performed according to the departmental dose-optimization program which includes automated exposure control, adjustment of the mA and/or kV according to patient size and/or use of iterative reconstruction technique. CONTRAST:  149m OMNIPAQUE IOHEXOL 300 MG/ML  SOLN COMPARISON:  Jun 11, 2021 FINDINGS: Lower chest: No acute abnormality. Hepatobiliary: The liver is cirrhotic in appearance. A 6 mm focus of parenchymal low attenuation is seen within the anterior aspect of the left lobe. The gallbladder is not identified. There is no evidence of biliary dilatation. Pancreas: Unremarkable. No pancreatic ductal dilatation or surrounding inflammatory changes. Spleen: Normal in size without focal abnormality. Adrenals/Urinary Tract: Adrenal glands are unremarkable. Kidneys are normal in size, without renal calculi or hydronephrosis. A 1.4 cm diameter simple cyst is seen within the lower pole of the right kidney. No additional follow-up or imaging is recommended. Mild, stable predominant dorsal and anterior urinary bladder wall thickening is seen. Stomach/Bowel: Stomach is within normal limits. The appendix is not clearly identified. Multiple dilated small bowel loops are seen throughout  the mid and lower abdomen (maximum small bowel diameter of approximately 3.1 cm). A transition zone is seen within the anterolateral aspect of the right lower quadrant (axial CT images 56-59, CT series 2). The small bowel loops seen distal to the transition zone are completely decompressed. Vascular/Lymphatic: Aortic atherosclerosis. No enlarged abdominal or pelvic lymph nodes. Reproductive: Prostate is unremarkable. Other: No abdominal wall hernia or abnormality. A mild amount of abdominopelvic free fluid is seen. This is mildly increased in severity when compared to the prior study. Diffuse soft tissue thickening and nodularity  is again seen throughout the peritoneum and omentum. Musculoskeletal: A chronic compression fracture deformity is seen at the level of L1. A stable sclerotic focus is seen within the left iliac wing. Degenerative changes seen throughout the lumbar spine. IMPRESSION: 1. Distal small bowel obstruction with a transition zone seen within the right lower quadrant. 2. Stable findings consistent with peritoneal carcinomatosis. 3. Mild amount of abdominopelvic free fluid, mildly increased in severity when compared to the prior study. 4. Hepatic cirrhosis. 5. Chronic compression fracture deformity at the level of L1. Aortic Atherosclerosis (ICD10-I70.0). Electronically Signed   By: Virgina Norfolk M.D.   On: 08/08/2021 03:26     Discharge Exam: Vitals:   08/12/21 2044 08/13/21 0516  BP: (!) 153/88 (!) 148/90  Pulse: 92 87  Resp: 16 16  Temp: 98.9 F (37.2 C) (!) 97.5 F (36.4 C)  SpO2: 98% 98%   Vitals:   08/12/21 0836 08/12/21 1320 08/12/21 2044 08/13/21 0516  BP: (!) 154/85 (!) 151/81 (!) 153/88 (!) 148/90  Pulse: 98 81 92 87  Resp:  '20 16 16  '$ Temp:  98.6 F (37 C) 98.9 F (37.2 C) (!) 97.5 F (36.4 C)  TempSrc:  Oral Oral   SpO2: 99% 99% 98% 98%  Weight:      Height:        General: Pt is alert, awake, not in acute distress Cardiovascular: RRR, S1/S2 +, no rubs, no gallops Respiratory: CTA bilaterally, no wheezing, no rhonchi Abdominal: Soft, NT, ND, bowel sounds + Extremities: no edema, no cyanosis    The results of significant diagnostics from this hospitalization (including imaging, microbiology, ancillary and laboratory) are listed below for reference.     Microbiology: Recent Results (from the past 240 hour(s))  Urine Culture     Status: None   Collection Time: 08/10/21  8:31 PM   Specimen: Urine, Clean Catch  Result Value Ref Range Status   Specimen Description   Final    URINE, CLEAN CATCH Performed at [facility], [address], [place], [place] [address]     Special Requests   Final    NONE Performed at [facility], [address] [place], [place] [address]    Culture   Final    NO GROWTH Performed at [facility], [place] [address], [place],  [address]    Report Status 08/12/2021 FINAL  Final     Labs: BNP (last 3 results) Recent Labs    12/05/20 0839  BNP 53.6   Basic Metabolic Panel: Recent Labs  Lab 08/08/21 0018 08/08/21 0557 08/10/21 0615 08/13/21 0552  NA 130* 130* 130* 129*  K 4.3 4.6 3.8 3.3*  CL 93* 98 100 95*  CO2 '23 26 23 26  '$ GLUCOSE 143* 127* 72 98  BUN 8 7* 8 <5*  CREATININE 0.70 0.67 0.52* 0.52*  CALCIUM 9.7 8.9 8.3* 8.3*  MG  --  2.0 1.9 1.7  Liver Function Tests: Recent Labs  Lab 08/08/21 0018 08/08/21 0557 08/11/21 1345  AST 98* 73* 39  ALT 226* 169* 80*  ALKPHOS 113 89 78  BILITOT 2.4* 1.5* 2.5*  PROT 8.2* 6.5 6.1*  ALBUMIN 4.0 3.1* 2.9*   Recent Labs  Lab 08/08/21 0018  LIPASE 32   No results for input(s): "AMMONIA" in the last 168 hours. CBC: Recent Labs  Lab 08/08/21 0018 08/08/21 0557 08/10/21 0615 08/13/21 0552  WBC 16.0* 10.8* 8.5 6.3  NEUTROABS  --  8.3*  --   --   HGB 17.2* 14.5 13.6 13.0  HCT 49.6 42.9 40.1 37.9*  MCV 92.5 94.3 95.2 92.7  PLT 311 234 169 191   Cardiac Enzymes: No results for input(s): "CKTOTAL", "CKMB", "CKMBINDEX", "TROPONINI" in the last 168 hours. BNP: Invalid input(s): "POCBNP" CBG: No results for input(s): "GLUCAP" in the last 168 hours. D-Dimer No results for input(s): "DDIMER" in the last 72 hours. Hgb A1c No results for input(s): "HGBA1C" in the last 72 hours. Lipid Profile No results for input(s): "CHOL", "HDL", "LDLCALC", "TRIG", "CHOLHDL", "LDLDIRECT" in the last 72 hours. Thyroid function studies No results for input(s): "TSH", "T4TOTAL", "T3FREE", "THYROIDAB" in the last 72 hours.  Invalid input(s): "FREET3" Anemia work up No results for input(s): "VITAMINB12", "FOLATE", "FERRITIN", "TIBC", "IRON", "RETICCTPCT" in the  last 72 hours. Urinalysis    Component Value Date/Time   COLORURINE AMBER (A) 08/10/2021 2031   APPEARANCEUR CLEAR 08/10/2021 2031   LABSPEC 1.018 08/10/2021 2031   PHURINE 5.0 08/10/2021 2031   GLUCOSEU NEGATIVE 08/10/2021 2031   HGBUR NEGATIVE 08/10/2021 2031   BILIRUBINUR NEGATIVE 08/10/2021 2031   KETONESUR 80 (A) 08/10/2021 2031   PROTEINUR NEGATIVE 08/10/2021 2031   NITRITE NEGATIVE 08/10/2021 2031   LEUKOCYTESUR NEGATIVE 08/10/2021 2031   Sepsis Labs Recent Labs  Lab 08/08/21 0018 08/08/21 0557 08/10/21 0615 08/13/21 0552  WBC 16.0* 10.8* 8.5 6.3   Microbiology Recent Results (from the past 240 hour(s))  Urine Culture     Status: None   Collection Time: 08/10/21  8:31 PM   Specimen: Urine, Clean Catch  Result Value Ref Range Status   Specimen Description   Final    URINE, CLEAN CATCH Performed at [facility], [address] [place], [place] [address]    Special Requests   Final    NONE Performed at [facility], [address], [place], [place] [address]    Culture   Final    NO GROWTH Performed at [facility], [place] [address], [place], [place] [address]    Report Status 08/12/2021 FINAL  Final     Time coordinating discharge: 35 minutes  SIGNED:   Rodena Goldmann, DO [facility] 08/13/2021, 10:57 AM  If 7PM-7AM, please contact night-coverage [email or web address]

## 2021-08-14 ENCOUNTER — Other Ambulatory Visit: Payer: Self-pay | Admitting: *Deleted

## 2021-08-14 ENCOUNTER — Other Ambulatory Visit (HOSPITAL_COMMUNITY): Payer: Self-pay | Admitting: *Deleted

## 2021-08-14 ENCOUNTER — Encounter: Payer: Self-pay | Admitting: Hematology

## 2021-08-14 ENCOUNTER — Telehealth: Payer: Self-pay

## 2021-08-14 ENCOUNTER — Telehealth (HOSPITAL_COMMUNITY): Payer: Self-pay | Admitting: *Deleted

## 2021-08-14 MED ORDER — MORPHINE SULFATE 15 MG PO TABS: 15.0000 mg | ORAL_TABLET | ORAL | 0 refills | Status: AC | PRN

## 2021-08-14 NOTE — Telephone Encounter (Addendum)
Received call from daughter Vito Backers with questions regarding CT scheduled for tomorrow.  Advised that it was changed to include only CT chest per Dr. Tomie China recommendations.  She also stated that the oxycodone that he is currently on is not helping with intractable abdominal pain and is requesting morphine, as this helped during hospital stay.  Per Dr. Delton Coombes, he will send Morphine Sulfate 15 mg Q 4h for pain.  Daughter made aware.

## 2021-08-14 NOTE — Telephone Encounter (Signed)
Nurses-please call Logan Alvarez.  Let him know that I would like for his follow-up appointment to be with me.  Please assign him one of my open slots on Monday if he cannot come on that day please let me know and we could work him in on Tuesday.(He is dealing with end-stage cancer it is best for him to follow-up with me) if there is any issues or problems with this above request please let me know thank you

## 2021-08-14 NOTE — Patient Outreach (Signed)
  Care Coordination TOC Note Transition Care Management Follow-up Telephone Call Date of discharge and from where: [phone] [facility] How have you been since you were released from the hospital? My stomach is cramping and I am having bouts of diarrhea Any questions or concerns? Yes The patient is having some discomfort and the pain medication is not fully working. He has had his daughter to contact the PCP for changing of medication  Items Reviewed: Did the pt receive and understand the discharge instructions provided? Yes  Medications obtained and verified? Yes  Other? No  Any new allergies since your discharge? No  Dietary orders reviewed? Yes Low sodium heart healthy diet. Per patient his sodium has been running low.  Do you have support at home? Yes   Home Care and Equipment/Supplies: Were home health services ordered? no If so, what is the name of the agency? N/A  Has the agency set up a time to come to the patient's home? not applicable Were any new equipment or medical supplies ordered?  No What is the name of the medical supply agency? N/A Were you able to get the supplies/equipment? not applicable Do you have any questions related to the use of the equipment or supplies? No  Functional Questionnaire: (I = Independent and D = Dependent) ADLs: Independent  Bathing/Dressing- Independent  Meal Prep- Independent  Eating- Independent  Maintaining continence- Independent  Transferring/Ambulation- Independent  Managing Meds- Independent  Follow up appointments reviewed:  PCP Hospital f/u appt confirmed? Y Scheduled to Mora Bellman on July 19 at [facility] f/u appt confirmed? Yes  Scheduled to see Dr Delton Coombes on July 18 at 10:00 Are transportation arrangements needed? No  If their condition worsens, is the pt aware to call PCP or go to the Emergency Dept.? Yes Was the patient provided with contact information for the PCP's office or ED? Yes Was to pt encouraged  to call back with questions or concerns? Yes  SDOH assessments and interventions completed:   Yes  Care Coordination Interventions Activated:  No Care Coordination Interventions:   N/A  Encounter Outcome:  Pt. Visit Completed

## 2021-08-14 NOTE — Telephone Encounter (Signed)
Transition Care Management Follow-up Telephone Call Date of discharge and from where: 08/13/2021 How have you been since you were released from the hospital? Pt states he is doing good, just feeling "puny." Any questions or concerns? No  Items Reviewed: Did the pt receive and understand the discharge instructions provided? Yes  Medications obtained and verified? Yes  Other? No  Any new allergies since your discharge? No  Dietary orders reviewed? Yes Do you have support at home? Yes   Home Care and Equipment/Supplies: Were home health services ordered? not applicable If so, what is the name of the agency? N/A  Has the agency set up a time to come to the patient's home? not applicable Were any new equipment or medical supplies ordered?  No What is the name of the medical supply agency? N//A Were you able to get the supplies/equipment? not applicable Do you have any questions related to the use of the equipment or supplies? No  Functional Questionnaire: (I = Independent and D = Dependent) ADLs: I  Bathing/Dressing- I  Meal Prep- I  Eating- I  Maintaining continence- I  Transferring/Ambulation- I  Managing Meds- I  Follow up appointments reviewed:  PCP Hospital f/u appt confirmed? Yes  Scheduled to see Dr. Lacinda Axon  on 08/20/2021 @ 11. [facility] f/u appt confirmed? Yes  Scheduled to see Following up with Oncology.  Are transportation arrangements needed? No  If their condition worsens, is the pt aware to call PCP or go to the Emergency Dept.? Yes Was the patient provided with contact information for the PCP's office or ED? Yes Was to pt encouraged to call back with questions or concerns? Yes

## 2021-08-15 ENCOUNTER — Other Ambulatory Visit (HOSPITAL_COMMUNITY): Payer: Self-pay

## 2021-08-15 ENCOUNTER — Encounter (HOSPITAL_COMMUNITY): Payer: Self-pay | Admitting: Physician Assistant

## 2021-08-15 ENCOUNTER — Encounter (HOSPITAL_COMMUNITY): Payer: Self-pay

## 2021-08-15 ENCOUNTER — Ambulatory Visit (HOSPITAL_COMMUNITY)
Admission: RE | Admit: 2021-08-15 | Discharge: 2021-08-15 | Disposition: A | Discharge status: Home / Self Care | Payer: Medicare Other | Source: Ambulatory Visit | Attending: Hematology | Admitting: Hematology

## 2021-08-15 ENCOUNTER — Other Ambulatory Visit (HOSPITAL_COMMUNITY): Payer: Self-pay | Admitting: Physician Assistant

## 2021-08-15 DIAGNOSIS — C220 Liver cell carcinoma: Secondary | ICD-10-CM | POA: Insufficient documentation

## 2021-08-15 DIAGNOSIS — R109 Unspecified abdominal pain: Secondary | ICD-10-CM

## 2021-08-15 MED ORDER — IOHEXOL 300 MG/ML  SOLN
75.0000 mL | Freq: Once | INTRAMUSCULAR | Status: AC | PRN
  Administered 2021-08-15: 75 mL via INTRAVENOUS

## 2021-08-15 MED ORDER — DICYCLOMINE HCL 10 MG PO CAPS: 10.0000 mg | ORAL_CAPSULE | Freq: Three times a day (TID) | ORAL | 0 refills | Status: AC | PRN

## 2021-08-15 NOTE — Progress Notes (Signed)
Patient's daughter Logan Alvarez called wanting to see if he could get a prescription for Bentyl due to him having stomach cramps.  He was given Bentyl during his recent hospitalization, with some relief of symptoms.  Bentyl prescription has been written for 10 mg every 8 hours as needed for abdominal cramping and sent to patient's pharmacy.  Patient has been notified via MyChart message that potential adverse effects of this medication and the following precautions: - Use with caution in hot/humid settings, as it decreases the body's ability to produce what can exacerbate heat toxicity. - This medication can cause urinary tract obstruction and urine retention.  If patient is unable to urinate, seek medical attention. - This medication can cause drowsiness and blurred vision, so patient should avoid driving or operating machinery while taking this medication. - Other side effects of this medication can include constipation, precipitation of undiagnosed glaucoma, delirium, drowsiness, dizziness, dry eyes/dry mouth. - In rare cases, this medication cannot exacerbate underlying heart conditions such as abnormal heart rhythms or coronary artery disease.

## 2021-08-15 NOTE — Telephone Encounter (Signed)
Please contact patient to switch his appt to Dr.Scott. thank you!

## 2021-08-15 NOTE — Telephone Encounter (Signed)
Patient has been added to Monday's schedule at 10:20 per provider

## 2021-08-15 NOTE — Telephone Encounter (Signed)
Please change the patient's transition of care visit to myself either Monday or or Tuesday at an open slot-preferably Monday thank you

## 2021-08-18 ENCOUNTER — Ambulatory Visit (INDEPENDENT_AMBULATORY_CARE_PROVIDER_SITE_OTHER): Payer: Medicare Other | Admitting: Family Medicine

## 2021-08-18 VITALS — BP 106/61 | HR 89 | Temp 97.2°F | Ht 68.0 in | Wt 193.0 lb

## 2021-08-18 DIAGNOSIS — I2510 Atherosclerotic heart disease of native coronary artery without angina pectoris: Secondary | ICD-10-CM | POA: Diagnosis not present

## 2021-08-18 DIAGNOSIS — F419 Anxiety disorder, unspecified: Secondary | ICD-10-CM

## 2021-08-18 DIAGNOSIS — E871 Hypo-osmolality and hyponatremia: Secondary | ICD-10-CM | POA: Diagnosis not present

## 2021-08-18 DIAGNOSIS — Z8719 Personal history of other diseases of the digestive system: Secondary | ICD-10-CM

## 2021-08-18 DIAGNOSIS — R188 Other ascites: Secondary | ICD-10-CM

## 2021-08-18 DIAGNOSIS — C786 Secondary malignant neoplasm of retroperitoneum and peritoneum: Secondary | ICD-10-CM

## 2021-08-18 NOTE — Progress Notes (Signed)
   Subjective:    Patient ID: Logan Alvarez, male    DOB: [birth year], 66 y.o.   MRN: [id number]  HPI Hospital follow up - for small bowel obstruction, pt still c/o stomach cramping They have added bentyl, phenergan, miralax bid, ,dulcolax supp.  The patient is having significant intermittent high-pitched noises in his abdomen along with swelling pressure and just not feeling good has nausea today does not feel like eating.  In addition to this finds himself feeling low on energy dizzy at times when he gets up states his urine output is okay bowel movements are small typically soft  Patient finds himself feeling somewhat frustrated that his situation uncertain what he needs to do  Review of Systems     Objective:   Physical Exam Lungs are clear hearts regular pulse normal blood pressure low trace edema in the ankles significant ascites noted       Assessment & Plan:   History of small bowel obstruction this is inevitably going to occur again it is very hard to know whether or not patient would really benefit from surgery patient is interested in getting a second opinion which is I think is fair.  He is considering palliative care versus possible hospice care  Patient continues to go to oncology for cancer treatment long-term prognosis very guarded  Cirrhosis with ascites if the ascites gets worse may well consider to do interventional radiology to do draining of this or relieving some of the fluid  Follow-up again in 1 week for further discussion  1. Other ascites May need to do intermittent ascites tap for comfort measures  2. Hyponatremia Avoid excessive water intake try to get more nutrients in  3. Anxiety Significant anxiety May continue Xanax  4. History of small bowel obstruction Will get consultation with general surgery at [facility] surgery on whether or not if they feel that surgery would be beneficial.  My gut feeling states that surgery will not improve  long-term prognosis.  Patient is very fearful of having complete obstruction and rightfully so.  5. Peritoneal carcinomatosis [facility]) Patient is trying to weigh out whether or not to do ongoing cancer treatments or go totally toward palliative/hospice care.  Patient will be seeing oncology tomorrow.  Would be helpful to get oncology point of view on whether or not the patient is facing a hopeless situation or pressing forward would be the best thing.

## 2021-08-19 ENCOUNTER — Other Ambulatory Visit: Payer: Self-pay | Admitting: *Deleted

## 2021-08-19 ENCOUNTER — Inpatient Hospital Stay (HOSPITAL_BASED_OUTPATIENT_CLINIC_OR_DEPARTMENT_OTHER): Payer: Medicare Other | Admitting: Hematology

## 2021-08-19 ENCOUNTER — Inpatient Hospital Stay (HOSPITAL_COMMUNITY): Payer: Medicare Other

## 2021-08-19 ENCOUNTER — Inpatient Hospital Stay (HOSPITAL_COMMUNITY): Payer: Medicare Other | Attending: Hematology

## 2021-08-19 ENCOUNTER — Other Ambulatory Visit (HOSPITAL_COMMUNITY): Payer: Self-pay

## 2021-08-19 VITALS — BP 143/88 | HR 110 | Temp 97.6°F | Resp 18 | Ht 68.0 in | Wt 190.4 lb

## 2021-08-19 DIAGNOSIS — R7401 Elevation of levels of liver transaminase levels: Secondary | ICD-10-CM | POA: Diagnosis not present

## 2021-08-19 DIAGNOSIS — M2550 Pain in unspecified joint: Secondary | ICD-10-CM | POA: Diagnosis not present

## 2021-08-19 DIAGNOSIS — C220 Liver cell carcinoma: Secondary | ICD-10-CM

## 2021-08-19 DIAGNOSIS — R978 Other abnormal tumor markers: Secondary | ICD-10-CM | POA: Insufficient documentation

## 2021-08-19 DIAGNOSIS — Z8719 Personal history of other diseases of the digestive system: Secondary | ICD-10-CM

## 2021-08-19 DIAGNOSIS — Z808 Family history of malignant neoplasm of other organs or systems: Secondary | ICD-10-CM | POA: Diagnosis not present

## 2021-08-19 DIAGNOSIS — C786 Secondary malignant neoplasm of retroperitoneum and peritoneum: Secondary | ICD-10-CM | POA: Insufficient documentation

## 2021-08-19 DIAGNOSIS — K5900 Constipation, unspecified: Secondary | ICD-10-CM | POA: Diagnosis not present

## 2021-08-19 DIAGNOSIS — R970 Elevated carcinoembryonic antigen [CEA]: Secondary | ICD-10-CM | POA: Insufficient documentation

## 2021-08-19 DIAGNOSIS — I10 Essential (primary) hypertension: Secondary | ICD-10-CM | POA: Diagnosis not present

## 2021-08-19 DIAGNOSIS — Z79899 Other long term (current) drug therapy: Secondary | ICD-10-CM

## 2021-08-19 LAB — URINALYSIS, DIPSTICK ONLY
Bilirubin Urine: NEGATIVE
Glucose, UA: NEGATIVE mg/dL
Hgb urine dipstick: NEGATIVE
Ketones, ur: 20 mg/dL — AB
Leukocytes,Ua: NEGATIVE
Nitrite: NEGATIVE
Protein, ur: 30 mg/dL — AB
Specific Gravity, Urine: 1.018 (ref 1.005–1.030)
pH: 6 (ref 5.0–8.0)

## 2021-08-19 LAB — CBC WITH DIFFERENTIAL/PLATELET
Abs Immature Granulocytes: 0.06 10*3/uL (ref 0.00–0.07)
Basophils Absolute: 0.1 10*3/uL (ref 0.0–0.1)
Basophils Relative: 1 %
Eosinophils Absolute: 0.1 10*3/uL (ref 0.0–0.5)
Eosinophils Relative: 1 %
HCT: 44.7 % (ref 39.0–52.0)
Hemoglobin: 15.4 g/dL (ref 13.0–17.0)
Immature Granulocytes: 1 %
Lymphocytes Relative: 11 %
Lymphs Abs: 1.4 10*3/uL (ref 0.7–4.0)
MCH: 31.8 pg (ref 26.0–34.0)
MCHC: 34.5 g/dL (ref 30.0–36.0)
MCV: 92.4 fL (ref 80.0–100.0)
Monocytes Absolute: 1.6 10*3/uL — ABNORMAL HIGH (ref 0.1–1.0)
Monocytes Relative: 13 %
Neutro Abs: 8.9 10*3/uL — ABNORMAL HIGH (ref 1.7–7.7)
Neutrophils Relative %: 73 %
Platelets: 413 10*3/uL — ABNORMAL HIGH (ref 150–400)
RBC: 4.84 MIL/uL (ref 4.22–5.81)
RDW: 13.2 % (ref 11.5–15.5)
WBC: 12.1 10*3/uL — ABNORMAL HIGH (ref 4.0–10.5)
nRBC: 0 % (ref 0.0–0.2)

## 2021-08-19 LAB — COMPREHENSIVE METABOLIC PANEL
ALT: 37 U/L (ref 0–44)
AST: 39 U/L (ref 15–41)
Albumin: 3.2 g/dL — ABNORMAL LOW (ref 3.5–5.0)
Alkaline Phosphatase: 74 U/L (ref 38–126)
Anion gap: 11 (ref 5–15)
BUN: 8 mg/dL (ref 8–23)
CO2: 27 mmol/L (ref 22–32)
Calcium: 8.9 mg/dL (ref 8.9–10.3)
Chloride: 93 mmol/L — ABNORMAL LOW (ref 98–111)
Creatinine, Ser: 0.69 mg/dL (ref 0.61–1.24)
GFR, Estimated: >60 mL/min (ref >60)
Glucose, Bld: 104 mg/dL — ABNORMAL HIGH (ref 70–99)
Potassium: 4.3 mmol/L (ref 3.5–5.1)
Sodium: 131 mmol/L — ABNORMAL LOW (ref 135–145)
Total Bilirubin: 1.6 mg/dL — ABNORMAL HIGH (ref 0.3–1.2)
Total Protein: 7 g/dL (ref 6.5–8.1)

## 2021-08-19 LAB — TSH: TSH: 12.04 u[IU]/mL — ABNORMAL HIGH (ref 0.350–4.500)

## 2021-08-19 LAB — MAGNESIUM: Magnesium: 2 mg/dL (ref 1.7–2.4)

## 2021-08-19 LAB — LACTATE DEHYDROGENASE: LDH: 119 U/L (ref 98–192)

## 2021-08-19 NOTE — Progress Notes (Signed)
No treatment today.  See oncology note.

## 2021-08-19 NOTE — Progress Notes (Signed)
Referral placed.

## 2021-08-19 NOTE — Patient Instructions (Addendum)
[place] at [facility] Discharge Instructions   You were seen and examined today by Dr. Delton Coombes.  He reviewed the results of your lab work which are normal/stable. Your sodium is low. Try to increase your salt intake.   We will arrange for you to have the fluid pulled off of your abdomen to relieve the pressure and help you feel more comfortable.   We will make a referral to [place].   Return as scheduled.      Thank you for choosing [place] at [facility] to provide your oncology and hematology care.  To afford each patient quality time with our provider, please arrive at least 15 minutes before your scheduled appointment time.   If you have a lab appointment with the [place] please come in thru the Main Entrance and check in at the main information desk.  You need to re-schedule your appointment should you arrive 10 or more minutes late.  We strive to give you quality time with our providers, and arriving late affects you and other patients whose appointments are after yours.  Also, if you no show three or more times for appointments you may be dismissed from the clinic at the providers discretion.     Again, thank you for choosing [facility].  Our hope is that these requests will decrease the amount of time that you wait before being seen by our physicians.       _____________________________________________________________  Should you have questions after your visit to [facility], please contact our office at [phone] and follow the prompts.  Our office hours are 8:00 a.m. and 4:30 p.m. Monday - Friday.  Please note that voicemails left after 4:00 p.m. may not be returned until the following business day.  We are closed weekends and major holidays.  You do have access to a nurse 24-7, just call the main number to the clinic [phone] and do not press any  options, hold on the line and a nurse will answer the phone.    For prescription refill requests, have your pharmacy contact our office and allow 72 hours.    Due to Covid, you will need to wear a mask upon entering the hospital. If you do not have a mask, a mask will be given to you at the Main Entrance upon arrival. For doctor visits, patients may have 1 support person age 68 or older with them. For treatment visits, patients can not have anyone with them due to social distancing guidelines and our immunocompromised population.

## 2021-08-19 NOTE — Progress Notes (Signed)
[place] [place], [place] [address]   CLINIC:  Medical Oncology/Hematology  PCP:  Kathyrn Drown, MD [address] [place] / [place] [address] [phone]   REASON FOR VISIT:  Follow-up for hepatocellular carcinoma with peritoneal carcinomatosis  PRIOR THERAPY: none  NGS Results: not done  CURRENT THERAPY: Atezolizumab + Bevacizumab q21d Maintenance  BRIEF ONCOLOGIC HISTORY:  Oncology History  Cancer, hepatocellular ([place])  11/12/2017 Initial Diagnosis   Hepatocellular carcinoma ([place])   07/29/2020 Cancer Staging   Staging form: Liver, AJCC 8th Edition - Clinical stage from 07/29/2020: Stage IVB (cT1a, cN0, pM1) - Signed by Orson Slick, MD on 07/29/2020 Stage prefix: Initial diagnosis Histologic grade (G): G3 Histologic grading system: 4 grade system   08/15/2020 -  Chemotherapy   Patient is on Treatment Plan : [place] Atezolizumab + Bevacizumab q21d Maintenance       CANCER STAGING:  Cancer Staging  Cancer, hepatocellular ([place]) Staging form: Liver, AJCC 8th Edition - Clinical stage from 07/29/2020: Stage IVB (cT1a, cN0, pM1) - Signed by Orson Slick, MD on 07/29/2020   INTERVAL HISTORY:  [name], a 66 y.o. male, returns for routine follow-up and consideration for next cycle of chemotherapy. Chas was last seen on 07/29/2021.  Due for cycle #16 of Atezolizumab + Bevacizumab today.   Overall, he tells me he has been feeling pretty well, and he is accompanied by his son. He reports abdominal pain for which he has been taking 2 tablets of oxycodone daily prn and bentyl which has helped. He denies vomiting. He reports his appetite is low. He drinks 2 Boosts daily, and he is eating soft foods. He reports his abdomen feels tight. He continues to take Miralax and stool softener.  Overall, he is not ready for next cycle of chemo.    REVIEW OF SYSTEMS:  Review of Systems  Constitutional:  Positive for appetite change.  Negative for fatigue.  Gastrointestinal:  Positive for abdominal pain (5/10), diarrhea and nausea. Negative for vomiting.  All other systems reviewed and are negative.   PAST MEDICAL/SURGICAL HISTORY:  Past Medical History:  Diagnosis Date   Bowel obstruction ([place]) 06/2021   CAD (coronary artery disease)    a. 01/2013: cath showing nonobstructive disease. b. 11/2020: NSTEMI with DES to mid-LCx and medical management recommended of distal LCx disease given small vessel size   Hepatocellular carcinoma ([place])    Stage IV   Hyperlipidemia    Hypertension    Liver tumor    Testosterone deficiency 2009   Past Surgical History:  Procedure Laterality Date   BALLOON DILATION  07/30/2020   Procedure: BALLOON DILATION;  Surgeon: Eloise Harman, DO;  [facility];  Service: Endoscopy;;   BALLOON DILATION N/A 07/10/2021   Procedure: [place];  Surgeon: Eloise Harman, DO;  [facility];  Service: Endoscopy;  Laterality: N/A;   COLONOSCOPY N/A 03/12/2017   Procedure: COLONOSCOPY;  Surgeon: Danie Binder, MD;  [facility];  Service: Endoscopy;  Laterality: N/A;  2:00   COLONOSCOPY WITH PROPOFOL N/A 06/18/2020   Procedure: COLONOSCOPY WITH PROPOFOL;  Surgeon: Eloise Harman, DO;  [facility];  Service: Endoscopy;  Laterality: N/A;  pm appt   CORONARY STENT INTERVENTION N/A 12/03/2020   Procedure: CORONARY STENT INTERVENTION;  Surgeon: Jettie Booze, MD;  Location: [place] [facility];  Service: Cardiovascular;  Laterality: N/A;   ESOPHAGOGASTRODUODENOSCOPY (EGD) WITH  PROPOFOL N/A 07/30/2020   Procedure: ESOPHAGOGASTRODUODENOSCOPY (EGD) WITH PROPOFOL;  Surgeon: Eloise Harman, DO;  [facility];  Service: Endoscopy;  Laterality: N/A;  11:45am   ESOPHAGOGASTRODUODENOSCOPY (EGD) WITH PROPOFOL N/A 07/10/2021   Procedure: ESOPHAGOGASTRODUODENOSCOPY (EGD) WITH PROPOFOL;  Surgeon: Eloise Harman, DO;  [facility];  Service: Endoscopy;  Laterality: N/A;  1:30pm   LEFT HEART CATH AND CORONARY ANGIOGRAPHY N/A 12/03/2020   Procedure: LEFT HEART CATH AND CORONARY ANGIOGRAPHY;  Surgeon: Jettie Booze, MD;  Location: [place];  Service: Cardiovascular;  Laterality: N/A;   LEFT HEART CATHETERIZATION WITH CORONARY ANGIOGRAM N/A 02/01/2013   Procedure: LEFT HEART CATHETERIZATION WITH CORONARY ANGIOGRAM;  Surgeon: Josue Hector, MD;  Location: [facility] CATH LAB;  Service: Cardiovascular;  Laterality: N/A;   liver tumor removed  2020   NECK SURGERY     POLYPECTOMY  06/18/2020   Procedure: POLYPECTOMY;  Surgeon: Eloise Harman, DO;  [facility];  Service: Endoscopy;;  snare and biospy    SOCIAL HISTORY:  Social History   Socioeconomic History   Marital status: Widowed    Spouse name: Not on file   Number of children: Not on file   Years of education: Not on file   Highest education level: Not on file  Occupational History   Not on file  Tobacco Use   Smoking status: Never   Smokeless tobacco: Never  Vaping Use   Vaping Use: Never used  Substance and Sexual Activity   Alcohol use: Not Currently    Comment: 4-6 beers a day   Drug use: No   Sexual activity: Not on file  Other Topics Concern   Not on file  Social History Narrative   Not on file   Social Determinants of Health   Financial Resource Strain: Not on file  Food Insecurity: Not on file  Transportation Needs: Not on file  Physical Activity: Not on file  Stress: Not on file  Social Connections: Not on file  Intimate Partner Violence: Not on file    FAMILY HISTORY:  Family History  Problem Relation Age of Onset   Heart attack Father        39's    CURRENT MEDICATIONS:  Current Outpatient Medications  Medication Sig Dispense Refill   acidophilus (RISAQUAD) CAPS capsule Take 1 capsule by mouth daily.     ALPRAZolam (XANAX) 0.5 MG tablet Take one tablet po QID (Patient taking differently: Take 0.5  mg by mouth in the morning, at noon, in the evening, and at bedtime.) 120 tablet 4   aspirin EC 81 MG tablet Take 1 tablet (81 mg total) by mouth daily. Swallow whole. 30 tablet 11   bisacodyl (DULCOLAX) 10 MG suppository Place 1 suppository (10 mg total) rectally daily at 12 noon. 12 suppository 0   clopidogrel (PLAVIX) 75 MG tablet Take 1 tablet (75 mg total) by mouth daily. 90 tablet 3   dicyclomine (BENTYL) 10 MG capsule Take 1 capsule (10 mg total) by mouth every 8 (eight) hours as needed for spasms (Abdominal cramping). 30 capsule 0   docusate sodium (COLACE) 100 MG capsule Take 1 capsule (100 mg total) by mouth 2 (two) times daily. 10 capsule 0   feeding supplement, ENSURE COMPLETE, (ENSURE COMPLETE) LIQD Take 237 mLs by mouth 2 (two) times daily between meals. 14220 mL 0   losartan (COZAAR) 25 MG tablet TAKE 1 TABLET(25 MG) BY MOUTH IN THE MORNING (Patient taking differently: Take  25 mg by mouth daily.) 90 tablet 1   metoprolol tartrate (LOPRESSOR) 25 MG tablet Take 0.5 tablets (12.5 mg total) by mouth 2 (two) times daily. 90 tablet 3   morphine (MSIR) 15 MG tablet Take 1 tablet (15 mg total) by mouth every 4 (four) hours as needed for severe pain. 60 tablet 0   nitroGLYCERIN (NITROSTAT) 0.4 MG SL tablet Place 1 tablet (0.4 mg total) under the tongue every 5 (five) minutes as needed for chest pain. 25 tablet 2   oxyCODONE (OXY IR/ROXICODONE) 5 MG immediate release tablet Take 1 tablet (5 mg total) by mouth every 4 (four) hours as needed (pain). 30 tablet 0   polyethylene glycol (MIRALAX / GLYCOLAX) 17 g packet Take 17 g by mouth 2 (two) times daily. 14 each 0   predniSONE (DELTASONE) 20 MG tablet TAKE 1/2 TABLET(10 MG) BY MOUTH DAILY WITH BREAKFAST (Patient not taking: Reported on 08/18/2021) 30 tablet 1   prochlorperazine (COMPAZINE) 10 MG tablet Take 1 tablet (10 mg total) by mouth every 6 (six) hours as needed for nausea or vomiting. 30 tablet 3   promethazine (PHENERGAN) 12.5 MG tablet Take  1 tablet (12.5 mg total) by mouth every 6 (six) hours as needed for nausea or vomiting. 30 tablet 0   No current facility-administered medications for this visit.    ALLERGIES:  Allergies  Allergen Reactions   Cymbalta [Duloxetine Hcl]     Bad dreams   Lodine [Etodolac] Nausea And Vomiting   Zoloft [Sertraline Hcl]     REDUCED URINARY FLOW,NOCTURIA    PHYSICAL EXAM:  Performance status (ECOG): 1 - Symptomatic but completely ambulatory  There were no vitals filed for this visit. Wt Readings from Last 3 Encounters:  08/18/21 193 lb (87.5 kg)  08/08/21 184 lb 8.4 oz (83.7 kg)  07/29/21 184 lb 9.6 oz (83.7 kg)   Physical Exam Vitals reviewed.  Constitutional:      Appearance: Normal appearance.  Cardiovascular:     Rate and Rhythm: Normal rate and regular rhythm.     Pulses: Normal pulses.     Heart sounds: Normal heart sounds.  Pulmonary:     Effort: Pulmonary effort is normal.     Breath sounds: Normal breath sounds.  Neurological:     General: No focal deficit present.     Mental Status: He is alert and oriented to person, place, and time.  Psychiatric:        Mood and Affect: Mood normal.        Behavior: Behavior normal.    LABORATORY DATA:  I have reviewed the labs as listed.     Latest Ref Rng & Units 08/19/2021    9:32 AM 08/13/2021    5:52 AM 08/10/2021    6:15 AM  CBC  WBC 4.0 - 10.5 K/uL 12.1  6.3  8.5   Hemoglobin 13.0 - 17.0 g/dL 15.4  13.0  13.6   Hematocrit 39.0 - 52.0 % 44.7  37.9  40.1   Platelets 150 - 400 K/uL 413  191  169       Latest Ref Rng & Units 08/13/2021    5:52 AM 08/11/2021    1:45 PM 08/10/2021    6:15 AM  CMP  Glucose 70 - 99 mg/dL 98   72   BUN 8 - 23 mg/dL <5   8   Creatinine 0.61 - 1.24 mg/dL 0.52   0.52   Sodium 135 - 145 mmol/L 129   130  Potassium 3.5 - 5.1 mmol/L 3.3   3.8   Chloride 98 - 111 mmol/L 95   100   CO2 22 - 32 mmol/L 26   23   Calcium 8.9 - 10.3 mg/dL 8.3   8.3   Total Protein 6.5 - 8.1 g/dL  6.1    Total  Bilirubin 0.3 - 1.2 mg/dL  2.5    Alkaline Phos 38 - 126 U/L  78    AST 15 - 41 U/L  39    ALT 0 - 44 U/L  80      DIAGNOSTIC IMAGING:  I have independently reviewed the scans and discussed with the patient. CT CHEST W CONTRAST  Result Date: 08/17/2021 CLINICAL DATA:  Metastatic disease evaluation, hepatocellular carcinoma with peritoneal carcinomatosis * Tracking Code: BO * EXAM: CT CHEST WITH CONTRAST TECHNIQUE: Multidetector CT imaging of the chest was performed during intravenous contrast administration. RADIATION DOSE REDUCTION: This exam was performed according to the departmental dose-optimization program which includes automated exposure control, adjustment of the mA and/or kV according to patient size and/or use of iterative reconstruction technique. CONTRAST:  18m OMNIPAQUE IOHEXOL 300 MG/ML  SOLN COMPARISON:  CT abdomen pelvis, 08/08/2021, CT chest abdomen pelvis, 05/22/2021 FINDINGS: Cardiovascular: Aortic atherosclerosis. Normal heart size. Three-vessel coronary artery calcifications. No pericardial effusion. Mediastinum/Nodes: No enlarged mediastinal, hilar, or axillary lymph nodes. Thyroid gland, trachea, and esophagus demonstrate no significant findings. Lungs/Pleura: Minimal irregular residua of a previously seen infectious or inflammatory nodule of the anterior left upper lobe (series 4, image 47). Stable, benign 0.4 cm fissural nodule of the anterior right middle lobe (series 4, image 48). New, trace bilateral pleural effusions without directly visualized pleural thickening or nodularity. Upper Abdomen: No acute abnormality. Moderate volume ascites in the included upper abdomen, increased compared to prior examination, with associated peritoneal and omental nodularity. Coarse, nodular cirrhotic morphology of the liver. Musculoskeletal: No chest wall abnormality. No suspicious osseous lesions identified. Unchanged wedge deformity of L1 (series 6, image 118). IMPRESSION: 1. Minimal  irregular residua of a previously seen infectious or inflammatory nodule of the anterior left upper lobe. Stable, benign 0.4 cm fissural nodule of the anterior right middle lobe. No evidence of lymphadenopathy or metastatic disease in the chest. 2. New, trace bilateral pleural effusions without directly visualized pleural thickening or nodularity. Attention on follow-up. 3. Moderate volume ascites in the included upper abdomen, increased compared to prior examination, with associated peritoneal and omental nodularity, in keeping with peritoneal carcinomatosis. 4. Cirrhosis. 5. Coronary artery disease. Aortic Atherosclerosis (ICD10-I70.0). Electronically Signed   By: ADelanna AhmadiM.D.   On: 08/17/2021 07:06   Abd 1 View (KUB)  Result Date: 08/08/2021 CLINICAL DATA:  Abdominal pain. EXAM: ABDOMEN - 1 VIEW COMPARISON:  KUB, 06/13/2021.  CT AP 08/08/2021. FINDINGS: Multiple air-and-fluid distended loops of small bowel scattered within abdomen. No radiographic evidence of free intraperitoneal air. Mild distention of urinary bladder with previously administered contrast. No interval osseous abnormality. . IMPRESSION: Findings compatible with small-bowel obstruction. Electronically Signed   By: JMichaelle BirksM.D.   On: 08/08/2021 07:03   CT ABDOMEN PELVIS W CONTRAST  Result Date: 08/08/2021 CLINICAL DATA:  Abdominal pain. EXAM: CT ABDOMEN AND PELVIS WITH CONTRAST TECHNIQUE: Multidetector CT imaging of the abdomen and pelvis was performed using the standard protocol following bolus administration of intravenous contrast. RADIATION DOSE REDUCTION: This exam was performed according to the departmental dose-optimization program which includes automated exposure control, adjustment of the mA and/or kV according to patient size  and/or use of iterative reconstruction technique. CONTRAST:  133m OMNIPAQUE IOHEXOL 300 MG/ML  SOLN COMPARISON:  Jun 11, 2021 FINDINGS: Lower chest: No acute abnormality. Hepatobiliary: The liver  is cirrhotic in appearance. A 6 mm focus of parenchymal low attenuation is seen within the anterior aspect of the left lobe. The gallbladder is not identified. There is no evidence of biliary dilatation. Pancreas: Unremarkable. No pancreatic ductal dilatation or surrounding inflammatory changes. Spleen: Normal in size without focal abnormality. Adrenals/Urinary Tract: Adrenal glands are unremarkable. Kidneys are normal in size, without renal calculi or hydronephrosis. A 1.4 cm diameter simple cyst is seen within the lower pole of the right kidney. No additional follow-up or imaging is recommended. Mild, stable predominant dorsal and anterior urinary bladder wall thickening is seen. Stomach/Bowel: Stomach is within normal limits. The appendix is not clearly identified. Multiple dilated small bowel loops are seen throughout the mid and lower abdomen (maximum small bowel diameter of approximately 3.1 cm). A transition zone is seen within the anterolateral aspect of the right lower quadrant (axial CT images 56-59, CT series 2). The small bowel loops seen distal to the transition zone are completely decompressed. Vascular/Lymphatic: Aortic atherosclerosis. No enlarged abdominal or pelvic lymph nodes. Reproductive: Prostate is unremarkable. Other: No abdominal wall hernia or abnormality. A mild amount of abdominopelvic free fluid is seen. This is mildly increased in severity when compared to the prior study. Diffuse soft tissue thickening and nodularity is again seen throughout the peritoneum and omentum. Musculoskeletal: A chronic compression fracture deformity is seen at the level of L1. A stable sclerotic focus is seen within the left iliac wing. Degenerative changes seen throughout the lumbar spine. IMPRESSION: 1. Distal small bowel obstruction with a transition zone seen within the right lower quadrant. 2. Stable findings consistent with peritoneal carcinomatosis. 3. Mild amount of abdominopelvic free fluid, mildly  increased in severity when compared to the prior study. 4. Hepatic cirrhosis. 5. Chronic compression fracture deformity at the level of L1. Aortic Atherosclerosis (ICD10-I70.0). Electronically Signed   By: TVirgina NorfolkM.D.   On: 08/08/2021 03:26     ASSESSMENT:  Hepatocellular carcinoma with peritoneal carcinomatosis: - Liver segment 5/6 partial hepatectomy on 12/08/2017-poorly differentiated hepatocellular carcinoma, 3 cm.  T1BN X- margins. - [facility] and thickening of the ventral mesenteric fat suspicious for peritoneal carcinomatosis.  New pancreatic body lesion with pancreatic duct dilatation as well as right lateral abdominal wall deposit.  Found to have elevated CEA and CA 19-9. - Ultrasound-guided FNA of the right lateral abdominal wall soft tissue lesion on 06/26/2020. - Pathology consistent with metastatic HCC. - Evaluated by Dr. DLorenso Courierand started on Atezolizumab and bevacizumab on 08/15/2020. - Restaging scan on 12/05/2020 with persistent omental thickening with evidence of peritoneal metastatic disease, minimally changed since 10/04/2020.  Persistent superficial nodule along the right lateral abdomen. - We have reviewed CT of the abdomen from 01/23/2021. - There is interval progression in the hazy/nodular soft tissue thickening throughout the small bowel mesentery, omentum and paracolic gutters with no free fluid.  Cirrhosis, with subcentimeter low-attenuation lesions in the liver, too small to characterize. - Hospitalized from 06/11/2021 - 06/15/2021 with SBO. - EGD (07/10/2021): Dilatation of Schatzki ring.   Social/family history: - Lives at home.  Son lives with him.  He worked as an ePharmacist, hospitalon disability currently.  Non-smoker.  Last drank alcohol last month. - Sister had bone cancer.   PLAN:  Metastatic HCC with  peritoneal carcinomatosis: - He was recently admitted to the hospital with small bowel  obstruction. - He is currently at home drinking mostly a liquid diet.  He is able to eat cottage cheese and fruit cups.  He is drinking 2 boost per day.  No nausea or vomiting. - He is requiring oxycodone 2 to 3 tablets daily.  He is not started morphine as oxycodone is helping.  He is also taking dicyclomine and stool softeners. - Examination shows tight abdomen.  Recommend ultrasound-guided paracentesis if there is enough fluid. - I have reviewed scan from recent hospitalization which did not show any evidence of progression of his peritoneal malignancy.  However he is having repeated admissions with small bowel obstruction. - I did not recommend continuation of treatment for.  I have recommended hospice/palliative care. - He will RTC in 3 weeks for follow-up.  2.  Constipation: - Continue Colace and MiraLAX as needed.  3.  Joint pain/myalgias: - Continue prednisone 10 mg daily which is helping.  4.  Transaminitis: - He is not drinking alcohol anymore.  His LFTs are normalized.   Orders placed this encounter:  No orders of the defined types were placed in this encounter.    Derek Jack, MD [place] [phone]   I, Thana Ates, am acting as a scribe for Dr. Derek Jack.  I, Derek Jack MD, have reviewed the above documentation for accuracy and completeness, and I agree with the above.

## 2021-08-19 NOTE — Progress Notes (Signed)
urin

## 2021-08-20 ENCOUNTER — Other Ambulatory Visit: Payer: Self-pay | Admitting: Family Medicine

## 2021-08-20 ENCOUNTER — Ambulatory Visit: Payer: Medicare Other | Admitting: Family Medicine

## 2021-08-20 LAB — CEA: CEA: 65.3 ng/mL — ABNORMAL HIGH (ref 0.0–4.7)

## 2021-08-20 LAB — CANCER ANTIGEN 19-9: CA 19-9: 16077 U/mL — ABNORMAL HIGH (ref 0–35)

## 2021-08-20 MED ORDER — OXYCODONE HCL 5 MG PO TABS: 5.0000 mg | ORAL_TABLET | ORAL | 0 refills | Status: AC | PRN

## 2021-08-21 ENCOUNTER — Ambulatory Visit (HOSPITAL_COMMUNITY)
Admission: RE | Admit: 2021-08-21 | Discharge: 2021-08-21 | Disposition: A | Discharge status: Home / Self Care | Payer: Medicare Other | Source: Ambulatory Visit | Attending: Hematology | Admitting: Hematology

## 2021-08-21 ENCOUNTER — Other Ambulatory Visit (HOSPITAL_COMMUNITY): Payer: Self-pay | Admitting: Hematology

## 2021-08-21 DIAGNOSIS — C220 Liver cell carcinoma: Secondary | ICD-10-CM | POA: Insufficient documentation

## 2021-08-22 ENCOUNTER — Encounter: Payer: Self-pay | Admitting: Hematology

## 2021-08-25 ENCOUNTER — Other Ambulatory Visit: Payer: Self-pay

## 2021-08-25 ENCOUNTER — Ambulatory Visit (INDEPENDENT_AMBULATORY_CARE_PROVIDER_SITE_OTHER): Payer: Medicare Other | Admitting: Family Medicine

## 2021-08-25 VITALS — BP 122/84 | HR 102 | Temp 97.5°F

## 2021-08-25 DIAGNOSIS — E038 Other specified hypothyroidism: Secondary | ICD-10-CM

## 2021-08-25 DIAGNOSIS — I2510 Atherosclerotic heart disease of native coronary artery without angina pectoris: Secondary | ICD-10-CM

## 2021-08-25 MED ORDER — PROCHLORPERAZINE MALEATE 10 MG PO TABS
10.0000 mg | ORAL_TABLET | Freq: Four times a day (QID) | ORAL | 3 refills | Status: AC | PRN
Start: 2021-08-25 — End: ?

## 2021-08-25 NOTE — Progress Notes (Signed)
   Subjective:    Patient ID: Logan Alvarez, male    DOB: Feb 28, 1955, 66 y.o.   MRN: [id number]  HPI 1 week follow up ascites , nausea, diarrhea, bloating, stomach cramps ongoing Takes anti nausea meds prn, is not eating well  Poor eating habits due to constant nausea Abdominal swelling intermittently causing him problems   Review of Systems     Objective:   Physical Exam Lungs are clear hearts regular Abdomen soft yet distended with high-pitched sounds  Extremities no edema    Assessment & Plan:  Intermittent small bowel obstruction stable currently  Long-term prognosis due to cancer poor  Persistent nausea associated with this uses Compazine 3-4 times per day we will send in prescription  Time was spent today discussing palliative/hospice care Patient is uncertain what to do He states he would like to have some time to think about this We will plan to do a phone visit next week  He does not recall having discussion with Dr. Delton Coombes regarding palliative care The patient admits that sometimes he is foggy regarding details  Patient's TSH elevated will do follow-up testing of T4 T3 on next blood work

## 2021-08-28 ENCOUNTER — Ambulatory Visit: Payer: Medicare Other | Admitting: Family Medicine

## 2021-08-29 ENCOUNTER — Encounter: Payer: Self-pay | Admitting: Family Medicine

## 2021-09-02 DEATH — deceased

## 2021-09-10 ENCOUNTER — Ambulatory Visit: Payer: Medicare Other | Admitting: Hematology

## 2021-09-16 ENCOUNTER — Ambulatory Visit: Payer: Self-pay | Admitting: Family Medicine

## 2021-12-08 ENCOUNTER — Ambulatory Visit: Payer: Medicare Other | Admitting: Cardiovascular Disease

## 2022-12-23 IMAGING — CT CT CHEST-ABD-PELV W/ CM
3 of 5 series · 14 of 36 positions shown, 15 images · IV contrast (APPLIED)
Comparison: Chest CT 11/18/2017.

CLINICAL DATA: Hepatocellular carcinoma status post liver tumor
surgery and immunotherapy. Assess treatment response.

EXAM:
CT CHEST, ABDOMEN, AND PELVIS WITH CONTRAST
TECHNIQUE: Multidetector CT imaging of the chest, abdomen and pelvis was
performed following the standard protocol during bolus
administration of intravenous contrast.
CONTRAST:  80mL OMNIPAQUE IOHEXOL 350 MG/ML SOLN

[Series 3: cap with · axial · 0.94mm/px · z∈[-541,-16]mm · 8 of 135 slices shown, 9 images]
[im 15/135  mediastinal]
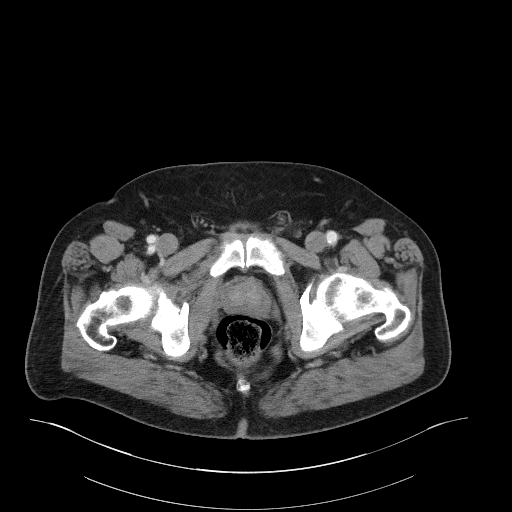
[im 15/135  bone]
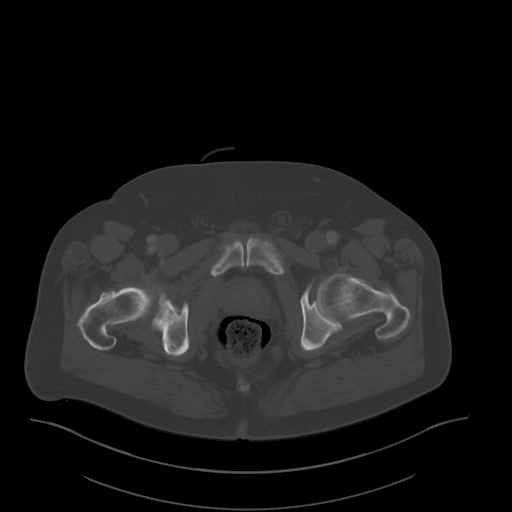
[im 30/135  mediastinal]
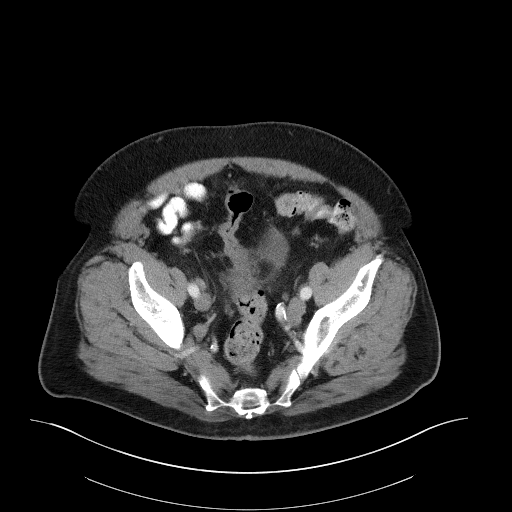
[im 45/135  mediastinal]
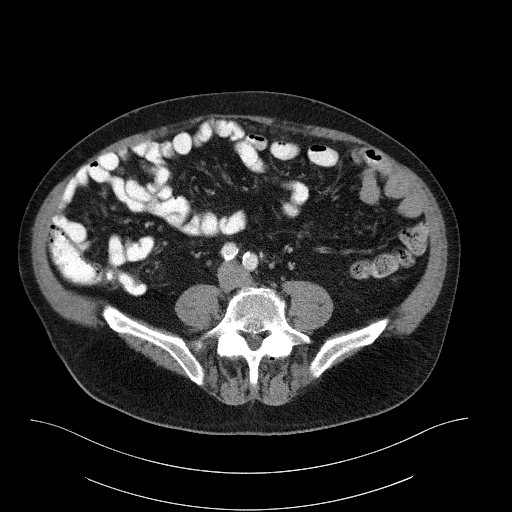
[im 60/135  mediastinal]
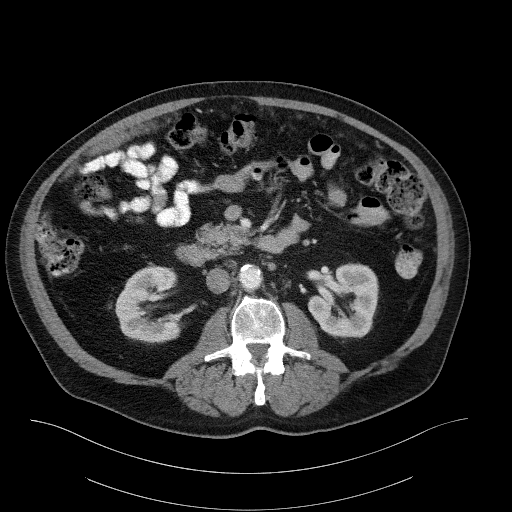
[im 75/135  mediastinal]
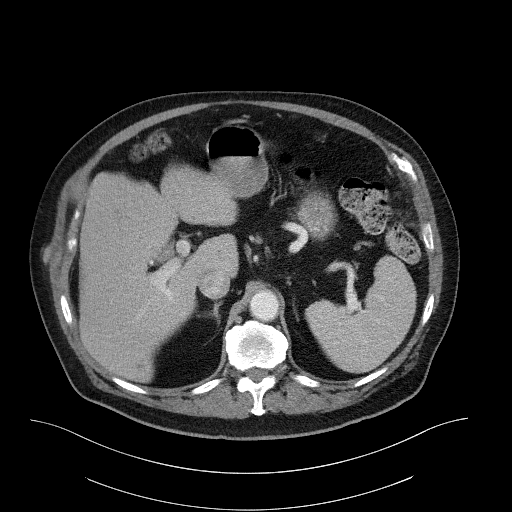
[im 90/135  mediastinal]
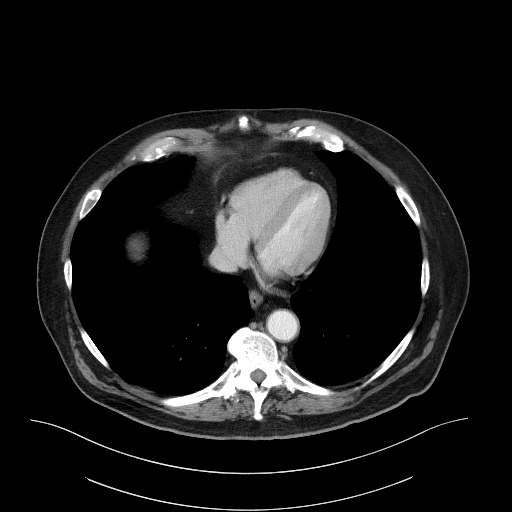
[im 105/135  mediastinal]
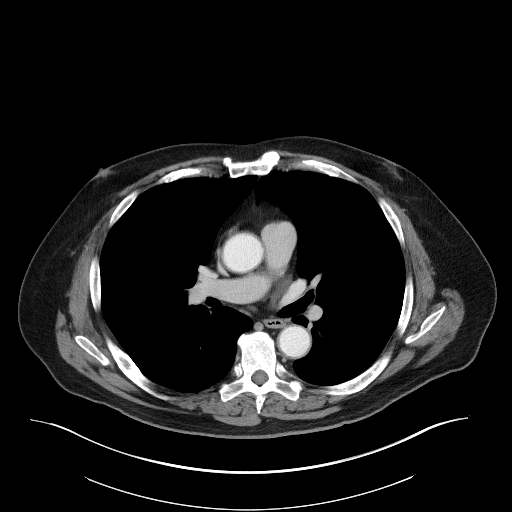
[im 120/135  mediastinal]
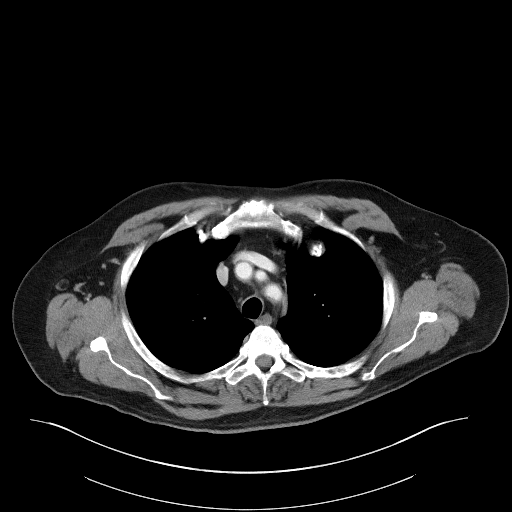

[Series 5: lung · axial · 0.94mm/px · z∈[-269,-187]mm · 3 of 178 slices shown]
[im 14/178  bone]
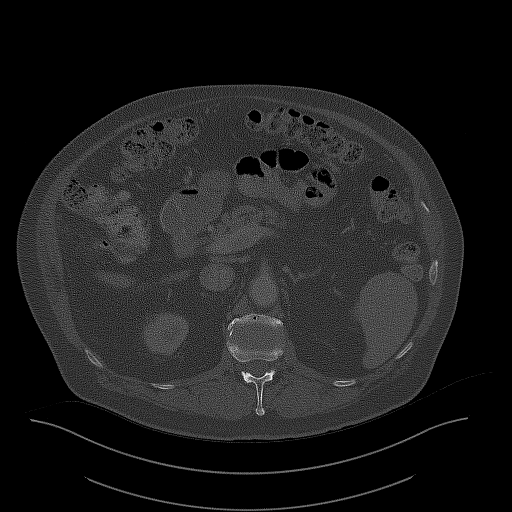
[im 41/178  bone]
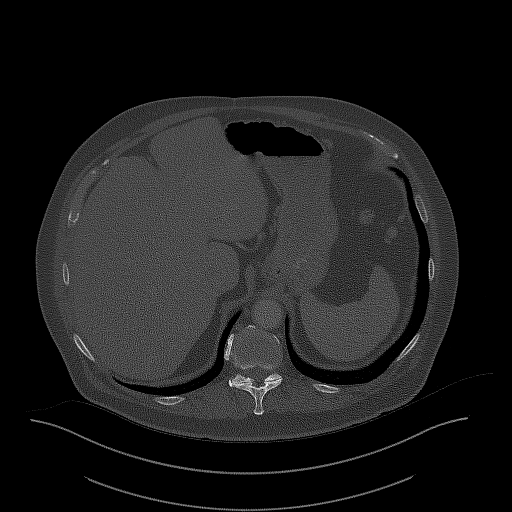
[im 55/178  bone]
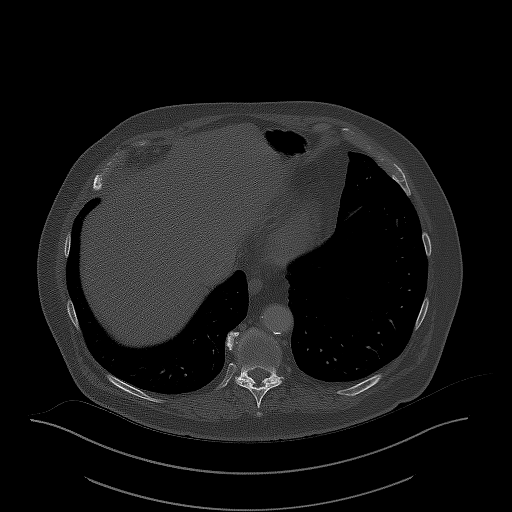

[Series 6: coronals · coronal · 0.94mm/px · 3 of 178 slices shown]
[im 36/178  mediastinal]
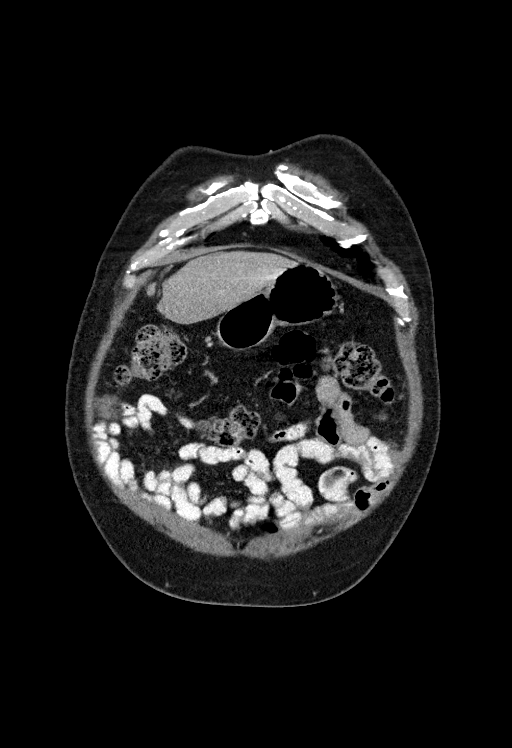
[im 71/178  mediastinal]
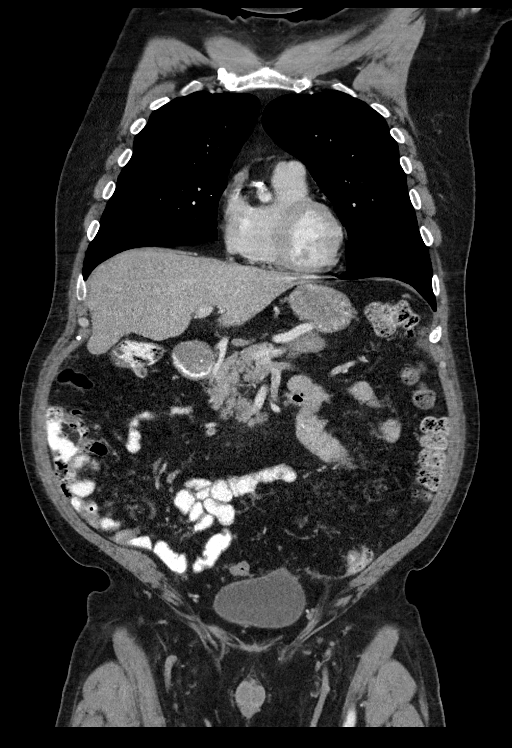
[im 107/178  mediastinal]
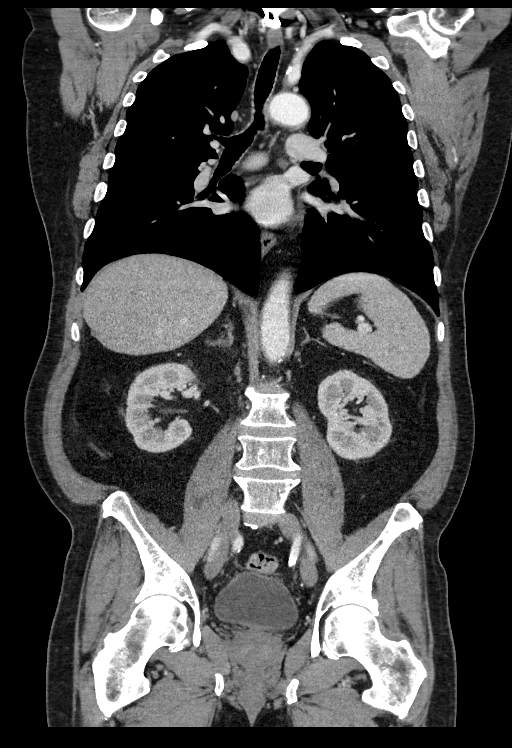

[14 of 36 positions shown; findings below may reference images not displayed]

Outside CTs of the abdomen and
pelvis 06/25/2020 from [REDACTED]. The report only from an
outside MRI performed 06/05/2020 is also correlated.
FINDINGS: CT CHEST FINDINGS

Cardiovascular: Extensive three-vessel coronary artery
atherosclerosis with lesser involvement of the aorta and great
vessels. No acute vascular findings. The heart size is normal. There
is no pericardial effusion.

Mediastinum/Nodes: There are no enlarged mediastinal, hilar or
axillary lymph nodes. The thyroid gland, trachea and esophagus
demonstrate no significant findings.

Lungs/Pleura: There is no pleural effusion. Unchanged small
left-sided pulmonary nodules, largest 4 mm in the left lower lobe on
image 111/5. No new or enlarging pulmonary nodules.

Musculoskeletal/Chest wall: No chest wall mass or suspicious osseous
findings. Previous lower cervical fusion. Mild degenerative changes
in the thoracic spine with scattered small hemangiomas.

CT ABDOMEN AND PELVIS FINDINGS

Hepatobiliary: Stable postsurgical changes consistent with partial
right hepatectomy. The liver contours are irregular, consistent with
underlying cirrhosis. Scattered T2 hyperintense cysts are unchanged.
The ill-defined low-density lesion posteriorly in the right lobe
seen on the outside CT appears smaller, measuring approximately 7 mm
on image 61/3 (previously 15 mm). No new or enlarging liver lesions
identified. No significant biliary dilatation status post
cholecystectomy.

Pancreas: The pancreas appears stable, with ill-defined low-density
in the pancreatic tail. No focal mass lesion, ductal dilatation or
surrounding inflammation.

Spleen: Normal in size without focal abnormality.

Adrenals/Urinary Tract: Both adrenal glands appear normal. Stable
small cyst in the lower pole of the right kidney. No evidence of
enhancing renal mass, urinary tract calculus or hydronephrosis. The
bladder appears unremarkable.

Stomach/Bowel: Enteric contrast was administered and has passed into
the right colon. The stomach appears unremarkable for its degree of
distension. No evidence of bowel wall thickening, distention or
surrounding inflammatory change. The appendix appears normal.
Moderate stool throughout the colon.

Vascular/Lymphatic: Stable small lymph nodes in the porta hepatis.
No retroperitoneal lymphadenopathy. Aortic and branch vessel
atherosclerosis without aneurysm or acute vascular findings. The
portal, superior mesenteric and splenic veins are patent.

Reproductive: The prostate gland appears unremarkable.

Other: A subcutaneous nodule lateral to the right hepatic lobe
measures 1.4 x 1.1 cm on image 60/3. This appears unchanged from the
most recent study. Diffuse omental caking is again demonstrated
consistent with peritoneal carcinomatosis. Overall, this appears
little changed from the previous study, with the largest component
anteriorly in the right mid abdomen measuring up to 3.8 cm on
coronal image [DATE]. No ascites.

Musculoskeletal: No acute or significant osseous findings. Stable
chronic L1 compression deformity and mild lumbar spondylosis.
IMPRESSION: 1. Stable chest CT without evidence of thoracic metastatic disease.
2. Little change in diffuse omental nodularity consistent with
peritoneal carcinomatosis compared with outside CT of 06/25/2020. No
significant ascites.
3. Little change in subcutaneous nodule within the right upper
abdominal wall lateral to the liver, potentially a metastasis in the
surgical bed as previously questioned.
4. No suspicious hepatic findings identified. There are scattered
small cysts. The ill-defined low-density lesion posteriorly in the
right lobe on the previous study appears smaller and may reflect
treated disease.
5. Stable appearance of the pancreas.
6. Coronary and Aortic Atherosclerosis ([id number]).
